# Patient Record
Sex: Female | Born: 1970 | Race: Black or African American | Hispanic: No | Marital: Married | Smoking: Never smoker
Health system: Southern US, Community
[De-identification: ages and names within clinical notes are randomized; demographics above are authoritative.]

## PROBLEM LIST (undated history)

## (undated) DIAGNOSIS — D649 Anemia, unspecified: Secondary | ICD-10-CM

## (undated) DIAGNOSIS — I8393 Asymptomatic varicose veins of bilateral lower extremities: Secondary | ICD-10-CM

## (undated) DIAGNOSIS — I10 Essential (primary) hypertension: Secondary | ICD-10-CM

## (undated) DIAGNOSIS — T7840XA Allergy, unspecified, initial encounter: Secondary | ICD-10-CM

## (undated) DIAGNOSIS — E7800 Pure hypercholesterolemia, unspecified: Secondary | ICD-10-CM

## (undated) DIAGNOSIS — R7989 Other specified abnormal findings of blood chemistry: Secondary | ICD-10-CM

## (undated) DIAGNOSIS — N921 Excessive and frequent menstruation with irregular cycle: Secondary | ICD-10-CM

## (undated) DIAGNOSIS — N83209 Unspecified ovarian cyst, unspecified side: Secondary | ICD-10-CM

## (undated) DIAGNOSIS — Z9889 Other specified postprocedural states: Secondary | ICD-10-CM

## (undated) DIAGNOSIS — N393 Stress incontinence (female) (male): Secondary | ICD-10-CM

## (undated) DIAGNOSIS — Z30013 Encounter for initial prescription of injectable contraceptive: Secondary | ICD-10-CM

## (undated) DIAGNOSIS — M222X9 Patellofemoral disorders, unspecified knee: Secondary | ICD-10-CM

## (undated) DIAGNOSIS — Z8759 Personal history of other complications of pregnancy, childbirth and the puerperium: Secondary | ICD-10-CM

## (undated) HISTORY — DX: Anemia, unspecified: D64.9

## (undated) HISTORY — DX: Encounter for initial prescription of injectable contraceptive: Z30.013

## (undated) HISTORY — DX: Other specified abnormal findings of blood chemistry: R79.89

## (undated) HISTORY — DX: Personal history of other complications of pregnancy, childbirth and the puerperium: Z87.59

## (undated) HISTORY — DX: Allergy, unspecified, initial encounter: T78.40XA

## (undated) HISTORY — DX: Essential (primary) hypertension: I10

## (undated) HISTORY — DX: Asymptomatic varicose veins of bilateral lower extremities: I83.93

## (undated) HISTORY — DX: Unspecified ovarian cyst, unspecified side: N83.209

## (undated) HISTORY — DX: Excessive and frequent menstruation with irregular cycle: N92.1

## (undated) HISTORY — DX: Stress incontinence (female) (male): N39.3

---
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
## 1997-09-06 ENCOUNTER — Other Ambulatory Visit: Admission: RE | Admit: 1997-09-06 | Discharge: 1997-09-06 | Payer: Self-pay | Admitting: Family Medicine

## 1999-06-06 ENCOUNTER — Encounter: Payer: Self-pay | Admitting: Family Medicine

## 1999-06-06 ENCOUNTER — Encounter: Admission: RE | Admit: 1999-06-06 | Discharge: 1999-06-06 | Payer: Self-pay | Admitting: Family Medicine

## 1999-10-23 ENCOUNTER — Inpatient Hospital Stay (HOSPITAL_COMMUNITY): Admission: AD | Admit: 1999-10-23 | Discharge: 1999-10-23 | Payer: Self-pay | Admitting: *Deleted

## 1999-11-13 ENCOUNTER — Ambulatory Visit (HOSPITAL_COMMUNITY): Admission: RE | Admit: 1999-11-13 | Discharge: 1999-11-13 | Payer: Self-pay | Admitting: *Deleted

## 2000-01-01 ENCOUNTER — Ambulatory Visit (HOSPITAL_COMMUNITY): Admission: RE | Admit: 2000-01-01 | Discharge: 2000-01-01 | Payer: Self-pay | Admitting: *Deleted

## 2000-01-05 ENCOUNTER — Inpatient Hospital Stay (HOSPITAL_COMMUNITY): Admission: AD | Admit: 2000-01-05 | Discharge: 2000-01-05 | Payer: Self-pay | Admitting: Obstetrics

## 2000-05-11 ENCOUNTER — Inpatient Hospital Stay (HOSPITAL_COMMUNITY): Admission: AD | Admit: 2000-05-11 | Discharge: 2000-05-11 | Payer: Self-pay | Admitting: *Deleted

## 2000-05-12 ENCOUNTER — Encounter: Payer: Self-pay | Admitting: Obstetrics & Gynecology

## 2000-05-21 ENCOUNTER — Ambulatory Visit (HOSPITAL_COMMUNITY): Admission: RE | Admit: 2000-05-21 | Discharge: 2000-05-21 | Payer: Self-pay | Admitting: Obstetrics

## 2000-05-24 ENCOUNTER — Inpatient Hospital Stay (HOSPITAL_COMMUNITY): Admission: AD | Admit: 2000-05-24 | Discharge: 2000-05-24 | Payer: Self-pay | Admitting: *Deleted

## 2000-05-30 ENCOUNTER — Encounter (HOSPITAL_COMMUNITY): Admission: RE | Admit: 2000-05-30 | Discharge: 2000-06-02 | Payer: Self-pay | Admitting: Obstetrics & Gynecology

## 2000-05-30 ENCOUNTER — Inpatient Hospital Stay (HOSPITAL_COMMUNITY): Admission: AD | Admit: 2000-05-30 | Discharge: 2000-05-30 | Payer: Self-pay | Admitting: Obstetrics & Gynecology

## 2000-05-30 ENCOUNTER — Inpatient Hospital Stay (HOSPITAL_COMMUNITY): Admission: AD | Admit: 2000-05-30 | Discharge: 2000-06-01 | Payer: Self-pay | Admitting: Obstetrics & Gynecology

## 2000-06-05 ENCOUNTER — Encounter: Admission: RE | Admit: 2000-06-05 | Discharge: 2000-07-05 | Payer: Self-pay | Admitting: *Deleted

## 2000-06-20 ENCOUNTER — Inpatient Hospital Stay (HOSPITAL_COMMUNITY): Admission: AD | Admit: 2000-06-20 | Discharge: 2000-06-20 | Payer: Self-pay | Admitting: Obstetrics & Gynecology

## 2001-08-09 ENCOUNTER — Inpatient Hospital Stay (HOSPITAL_COMMUNITY): Admission: AD | Admit: 2001-08-09 | Discharge: 2001-08-09 | Payer: Self-pay | Admitting: *Deleted

## 2001-09-11 ENCOUNTER — Inpatient Hospital Stay (HOSPITAL_COMMUNITY): Admission: AD | Admit: 2001-09-11 | Discharge: 2001-09-11 | Payer: Self-pay | Admitting: *Deleted

## 2001-09-28 ENCOUNTER — Observation Stay (HOSPITAL_COMMUNITY): Admission: AD | Admit: 2001-09-28 | Discharge: 2001-09-29 | Payer: Self-pay | Admitting: *Deleted

## 2001-10-30 ENCOUNTER — Ambulatory Visit (HOSPITAL_COMMUNITY): Admission: RE | Admit: 2001-10-30 | Discharge: 2001-10-30 | Payer: Self-pay | Admitting: *Deleted

## 2001-11-16 ENCOUNTER — Inpatient Hospital Stay (HOSPITAL_COMMUNITY): Admission: AD | Admit: 2001-11-16 | Discharge: 2001-11-16 | Payer: Self-pay | Admitting: *Deleted

## 2001-12-25 ENCOUNTER — Inpatient Hospital Stay (HOSPITAL_COMMUNITY): Admission: AD | Admit: 2001-12-25 | Discharge: 2001-12-27 | Payer: Self-pay | Admitting: Obstetrics and Gynecology

## 2001-12-26 ENCOUNTER — Encounter: Payer: Self-pay | Admitting: Obstetrics and Gynecology

## 2001-12-28 ENCOUNTER — Inpatient Hospital Stay (HOSPITAL_COMMUNITY): Admission: AD | Admit: 2001-12-28 | Discharge: 2001-12-28 | Payer: Self-pay | Admitting: *Deleted

## 2002-02-04 ENCOUNTER — Inpatient Hospital Stay (HOSPITAL_COMMUNITY): Admission: AD | Admit: 2002-02-04 | Discharge: 2002-02-04 | Payer: Self-pay | Admitting: *Deleted

## 2002-02-23 ENCOUNTER — Inpatient Hospital Stay (HOSPITAL_COMMUNITY): Admission: AD | Admit: 2002-02-23 | Discharge: 2002-02-23 | Payer: Self-pay | Admitting: *Deleted

## 2002-03-21 ENCOUNTER — Inpatient Hospital Stay (HOSPITAL_COMMUNITY): Admission: AD | Admit: 2002-03-21 | Discharge: 2002-03-23 | Payer: Self-pay | Admitting: *Deleted

## 2003-02-02 ENCOUNTER — Other Ambulatory Visit: Admission: RE | Admit: 2003-02-02 | Discharge: 2003-02-02 | Payer: Self-pay | Admitting: *Deleted

## 2003-02-02 ENCOUNTER — Other Ambulatory Visit: Admission: RE | Admit: 2003-02-02 | Discharge: 2003-02-02 | Payer: Self-pay | Admitting: Obstetrics and Gynecology

## 2003-02-12 ENCOUNTER — Encounter: Payer: Self-pay | Admitting: Emergency Medicine

## 2003-02-12 ENCOUNTER — Emergency Department (HOSPITAL_COMMUNITY): Admission: EM | Admit: 2003-02-12 | Discharge: 2003-02-12 | Payer: Self-pay | Admitting: Emergency Medicine

## 2003-06-21 ENCOUNTER — Emergency Department (HOSPITAL_COMMUNITY): Admission: EM | Admit: 2003-06-21 | Discharge: 2003-06-21 | Payer: Self-pay | Admitting: Family Medicine

## 2003-06-28 ENCOUNTER — Emergency Department (HOSPITAL_COMMUNITY): Admission: EM | Admit: 2003-06-28 | Discharge: 2003-06-28 | Payer: Self-pay | Admitting: Family Medicine

## 2003-08-22 ENCOUNTER — Inpatient Hospital Stay (HOSPITAL_COMMUNITY): Admission: AD | Admit: 2003-08-22 | Discharge: 2003-08-22 | Payer: Self-pay | Admitting: *Deleted

## 2003-08-25 ENCOUNTER — Inpatient Hospital Stay (HOSPITAL_COMMUNITY): Admission: AD | Admit: 2003-08-25 | Discharge: 2003-08-25 | Payer: Self-pay | Admitting: Obstetrics and Gynecology

## 2003-08-26 ENCOUNTER — Inpatient Hospital Stay (HOSPITAL_COMMUNITY): Admission: AD | Admit: 2003-08-26 | Discharge: 2003-08-26 | Payer: Self-pay | Admitting: Obstetrics and Gynecology

## 2003-09-01 ENCOUNTER — Inpatient Hospital Stay (HOSPITAL_COMMUNITY): Admission: AD | Admit: 2003-09-01 | Discharge: 2003-09-01 | Payer: Self-pay | Admitting: Obstetrics and Gynecology

## 2003-09-09 ENCOUNTER — Inpatient Hospital Stay (HOSPITAL_COMMUNITY): Admission: AD | Admit: 2003-09-09 | Discharge: 2003-09-09 | Payer: Self-pay | Admitting: Obstetrics and Gynecology

## 2003-09-21 ENCOUNTER — Inpatient Hospital Stay (HOSPITAL_COMMUNITY): Admission: AD | Admit: 2003-09-21 | Discharge: 2003-09-21 | Payer: Self-pay | Admitting: Obstetrics and Gynecology

## 2003-10-21 ENCOUNTER — Inpatient Hospital Stay (HOSPITAL_COMMUNITY): Admission: AD | Admit: 2003-10-21 | Discharge: 2003-10-21 | Payer: Self-pay | Admitting: Obstetrics and Gynecology

## 2003-10-30 ENCOUNTER — Inpatient Hospital Stay (HOSPITAL_COMMUNITY): Admission: AD | Admit: 2003-10-30 | Discharge: 2003-10-30 | Payer: Self-pay | Admitting: Obstetrics and Gynecology

## 2003-12-30 ENCOUNTER — Emergency Department (HOSPITAL_COMMUNITY): Admission: EM | Admit: 2003-12-30 | Discharge: 2003-12-30 | Payer: Self-pay | Admitting: Emergency Medicine

## 2004-03-13 ENCOUNTER — Inpatient Hospital Stay (HOSPITAL_COMMUNITY): Admission: AD | Admit: 2004-03-13 | Discharge: 2004-03-14 | Payer: Self-pay | Admitting: Obstetrics and Gynecology

## 2004-03-27 ENCOUNTER — Other Ambulatory Visit: Admission: RE | Admit: 2004-03-27 | Discharge: 2004-03-27 | Payer: Self-pay | Admitting: Obstetrics and Gynecology

## 2004-04-30 ENCOUNTER — Emergency Department (HOSPITAL_COMMUNITY): Admission: EM | Admit: 2004-04-30 | Discharge: 2004-04-30 | Payer: Self-pay | Admitting: Family Medicine

## 2004-05-12 ENCOUNTER — Inpatient Hospital Stay (HOSPITAL_COMMUNITY): Admission: AD | Admit: 2004-05-12 | Discharge: 2004-05-12 | Payer: Self-pay | Admitting: Obstetrics and Gynecology

## 2004-06-15 ENCOUNTER — Emergency Department (HOSPITAL_COMMUNITY): Admission: EM | Admit: 2004-06-15 | Discharge: 2004-06-15 | Payer: Self-pay | Admitting: Family Medicine

## 2004-06-18 ENCOUNTER — Inpatient Hospital Stay (HOSPITAL_COMMUNITY): Admission: AD | Admit: 2004-06-18 | Discharge: 2004-06-20 | Payer: Self-pay | Admitting: Obstetrics and Gynecology

## 2004-07-09 ENCOUNTER — Ambulatory Visit (HOSPITAL_COMMUNITY): Admission: RE | Admit: 2004-07-09 | Discharge: 2004-07-09 | Payer: Self-pay | Admitting: Obstetrics and Gynecology

## 2004-08-07 ENCOUNTER — Inpatient Hospital Stay (HOSPITAL_COMMUNITY): Admission: AD | Admit: 2004-08-07 | Discharge: 2004-08-07 | Payer: Self-pay | Admitting: Obstetrics and Gynecology

## 2004-09-28 ENCOUNTER — Inpatient Hospital Stay (HOSPITAL_COMMUNITY): Admission: AD | Admit: 2004-09-28 | Discharge: 2004-09-29 | Payer: Self-pay | Admitting: Obstetrics and Gynecology

## 2004-10-10 ENCOUNTER — Inpatient Hospital Stay (HOSPITAL_COMMUNITY): Admission: AD | Admit: 2004-10-10 | Discharge: 2004-10-10 | Payer: Self-pay | Admitting: Obstetrics and Gynecology

## 2004-10-11 ENCOUNTER — Inpatient Hospital Stay (HOSPITAL_COMMUNITY): Admission: AD | Admit: 2004-10-11 | Discharge: 2004-10-13 | Payer: Self-pay | Admitting: Obstetrics and Gynecology

## 2005-01-22 ENCOUNTER — Ambulatory Visit: Payer: Self-pay | Admitting: *Deleted

## 2005-02-13 ENCOUNTER — Ambulatory Visit: Payer: Self-pay | Admitting: Family Medicine

## 2005-02-15 ENCOUNTER — Ambulatory Visit: Payer: Self-pay | Admitting: Family Medicine

## 2005-02-27 ENCOUNTER — Emergency Department (HOSPITAL_COMMUNITY): Admission: EM | Admit: 2005-02-27 | Discharge: 2005-02-27 | Payer: Self-pay | Admitting: Family Medicine

## 2005-08-05 ENCOUNTER — Ambulatory Visit: Payer: Self-pay | Admitting: Family Medicine

## 2005-08-05 ENCOUNTER — Other Ambulatory Visit: Admission: RE | Admit: 2005-08-05 | Discharge: 2005-08-05 | Payer: Self-pay | Admitting: Family Medicine

## 2005-08-06 ENCOUNTER — Ambulatory Visit: Payer: Self-pay | Admitting: Family Medicine

## 2005-08-19 ENCOUNTER — Emergency Department (HOSPITAL_COMMUNITY): Admission: EM | Admit: 2005-08-19 | Discharge: 2005-08-19 | Payer: Self-pay | Admitting: Family Medicine

## 2006-04-22 ENCOUNTER — Emergency Department (HOSPITAL_COMMUNITY): Admission: EM | Admit: 2006-04-22 | Discharge: 2006-04-22 | Payer: Self-pay | Admitting: Family Medicine

## 2006-07-25 ENCOUNTER — Inpatient Hospital Stay (HOSPITAL_COMMUNITY): Admission: AD | Admit: 2006-07-25 | Discharge: 2006-07-26 | Payer: Self-pay | Admitting: Gynecology

## 2006-07-28 ENCOUNTER — Inpatient Hospital Stay (HOSPITAL_COMMUNITY): Admission: AD | Admit: 2006-07-28 | Discharge: 2006-07-28 | Payer: Self-pay | Admitting: Gynecology

## 2006-07-31 ENCOUNTER — Inpatient Hospital Stay (HOSPITAL_COMMUNITY): Admission: AD | Admit: 2006-07-31 | Discharge: 2006-07-31 | Payer: Self-pay | Admitting: Obstetrics and Gynecology

## 2006-08-03 ENCOUNTER — Inpatient Hospital Stay (HOSPITAL_COMMUNITY): Admission: AD | Admit: 2006-08-03 | Discharge: 2006-08-03 | Payer: Self-pay | Admitting: Obstetrics and Gynecology

## 2006-08-22 ENCOUNTER — Inpatient Hospital Stay (HOSPITAL_COMMUNITY): Admission: AD | Admit: 2006-08-22 | Discharge: 2006-08-22 | Payer: Self-pay | Admitting: Gynecology

## 2007-03-16 ENCOUNTER — Encounter (INDEPENDENT_AMBULATORY_CARE_PROVIDER_SITE_OTHER): Payer: Self-pay | Admitting: Family Medicine

## 2007-11-11 ENCOUNTER — Inpatient Hospital Stay (HOSPITAL_COMMUNITY): Admission: AD | Admit: 2007-11-11 | Discharge: 2007-11-11 | Payer: Self-pay | Admitting: Obstetrics and Gynecology

## 2007-12-23 ENCOUNTER — Inpatient Hospital Stay (HOSPITAL_COMMUNITY): Admission: AD | Admit: 2007-12-23 | Discharge: 2007-12-23 | Payer: Self-pay | Admitting: Obstetrics and Gynecology

## 2007-12-25 ENCOUNTER — Inpatient Hospital Stay (HOSPITAL_COMMUNITY): Admission: AD | Admit: 2007-12-25 | Discharge: 2007-12-25 | Payer: Self-pay | Admitting: Obstetrics and Gynecology

## 2008-01-05 ENCOUNTER — Inpatient Hospital Stay (HOSPITAL_COMMUNITY): Admission: AD | Admit: 2008-01-05 | Discharge: 2008-01-05 | Payer: Self-pay | Admitting: Obstetrics and Gynecology

## 2008-01-06 ENCOUNTER — Inpatient Hospital Stay (HOSPITAL_COMMUNITY): Admission: AD | Admit: 2008-01-06 | Discharge: 2008-01-06 | Payer: Self-pay | Admitting: Obstetrics and Gynecology

## 2008-01-08 ENCOUNTER — Inpatient Hospital Stay (HOSPITAL_COMMUNITY): Admission: AD | Admit: 2008-01-08 | Discharge: 2008-01-10 | Payer: Self-pay | Admitting: Obstetrics and Gynecology

## 2008-05-03 ENCOUNTER — Emergency Department (HOSPITAL_COMMUNITY): Admission: EM | Admit: 2008-05-03 | Discharge: 2008-05-03 | Payer: Self-pay | Admitting: Family Medicine

## 2008-05-14 ENCOUNTER — Emergency Department (HOSPITAL_COMMUNITY): Admission: EM | Admit: 2008-05-14 | Discharge: 2008-05-14 | Payer: Self-pay | Admitting: Family Medicine

## 2008-08-26 ENCOUNTER — Ambulatory Visit: Payer: Self-pay | Admitting: Family Medicine

## 2008-09-13 ENCOUNTER — Ambulatory Visit: Payer: Self-pay | Admitting: Family Medicine

## 2008-12-01 ENCOUNTER — Ambulatory Visit: Payer: Self-pay | Admitting: Internal Medicine

## 2008-12-02 ENCOUNTER — Ambulatory Visit: Payer: Self-pay | Admitting: Internal Medicine

## 2009-03-02 ENCOUNTER — Ambulatory Visit: Payer: Self-pay | Admitting: Family Medicine

## 2009-03-15 ENCOUNTER — Encounter (INDEPENDENT_AMBULATORY_CARE_PROVIDER_SITE_OTHER): Payer: Self-pay | Admitting: Adult Health

## 2009-03-15 ENCOUNTER — Ambulatory Visit: Payer: Self-pay | Admitting: Family Medicine

## 2009-03-15 LAB — CONVERTED CEMR LAB
LDL Cholesterol: 143 mg/dL — ABNORMAL HIGH (ref 0–99)
TSH: 0.447 microintl units/mL (ref 0.350–4.500)
Total CHOL/HDL Ratio: 3.8
VLDL: 12 mg/dL (ref 0–40)

## 2009-04-25 ENCOUNTER — Ambulatory Visit: Payer: Self-pay | Admitting: Family Medicine

## 2009-05-26 ENCOUNTER — Encounter (INDEPENDENT_AMBULATORY_CARE_PROVIDER_SITE_OTHER): Payer: Self-pay | Admitting: Adult Health

## 2009-05-26 ENCOUNTER — Ambulatory Visit: Payer: Self-pay | Admitting: Internal Medicine

## 2009-05-26 LAB — CONVERTED CEMR LAB
AST: 16 units/L (ref 0–37)
Albumin: 4.7 g/dL (ref 3.5–5.2)
CO2: 20 meq/L (ref 19–32)
Glucose, Bld: 91 mg/dL (ref 70–99)
HDL: 50 mg/dL (ref >39)
Potassium: 4.2 meq/L (ref 3.5–5.3)
Sodium: 139 meq/L (ref 135–145)
Total Bilirubin: 0.6 mg/dL (ref 0.3–1.2)
Triglycerides: 56 mg/dL (ref <150)

## 2010-02-21 ENCOUNTER — Ambulatory Visit: Payer: Self-pay | Admitting: Family Medicine

## 2010-02-21 ENCOUNTER — Encounter (INDEPENDENT_AMBULATORY_CARE_PROVIDER_SITE_OTHER): Payer: Self-pay | Admitting: *Deleted

## 2010-02-21 LAB — CONVERTED CEMR LAB
ALT: 10 units/L (ref 0–35)
Cholesterol: 181 mg/dL (ref 0–200)
HDL: 59 mg/dL (ref >39)
LDL Cholesterol: 105 mg/dL — ABNORMAL HIGH (ref 0–99)
Total CHOL/HDL Ratio: 3.1
Triglycerides: 83 mg/dL (ref <150)
VLDL: 17 mg/dL (ref 0–40)

## 2010-02-23 ENCOUNTER — Emergency Department (HOSPITAL_COMMUNITY): Admission: EM | Admit: 2010-02-23 | Discharge: 2010-02-23 | Payer: Self-pay | Admitting: Family Medicine

## 2010-04-25 ENCOUNTER — Encounter (INDEPENDENT_AMBULATORY_CARE_PROVIDER_SITE_OTHER): Payer: Self-pay | Admitting: *Deleted

## 2010-04-25 LAB — CONVERTED CEMR LAB
ALT: 14 units/L (ref 0–35)
Alkaline Phosphatase: 43 units/L (ref 39–117)
BUN: 12 mg/dL (ref 6–23)
CO2: 22 meq/L (ref 19–32)
Calcium: 8.7 mg/dL (ref 8.4–10.5)
Glucose, Bld: 83 mg/dL (ref 70–99)
RBC: 4.24 M/uL (ref 3.87–5.11)
RDW: 13.3 % (ref 11.5–15.5)
TSH: 0.327 microintl units/mL — ABNORMAL LOW (ref 0.350–4.500)
Total Protein: 7.2 g/dL (ref 6.0–8.3)

## 2010-04-26 ENCOUNTER — Encounter (INDEPENDENT_AMBULATORY_CARE_PROVIDER_SITE_OTHER): Payer: Self-pay | Admitting: Internal Medicine

## 2010-04-26 LAB — CONVERTED CEMR LAB
Free T4: 1 ng/dL (ref 0.80–1.80)
T3, Total: 132.7 ng/dL (ref 80.0–204.0)

## 2010-05-18 ENCOUNTER — Encounter (INDEPENDENT_AMBULATORY_CARE_PROVIDER_SITE_OTHER): Payer: Self-pay | Admitting: *Deleted

## 2010-05-18 LAB — CONVERTED CEMR LAB
Basophils Relative: 1 % (ref 0–1)
Chlamydia, DNA Probe: NEGATIVE
GC Probe Amp, Genital: NEGATIVE
Lymphocytes Relative: 45 % (ref 12–46)
Lymphs Abs: 2 10*3/uL (ref 0.7–4.0)
MCHC: 33.1 g/dL (ref 30.0–36.0)
Neutro Abs: 1.7 10*3/uL (ref 1.7–7.7)
Platelets: 330 10*3/uL (ref 150–400)
RBC: 4.31 M/uL (ref 3.87–5.11)
RDW: 13 % (ref 11.5–15.5)
T4, Total: 9 ug/dL (ref 5.0–12.5)

## 2010-05-22 ENCOUNTER — Encounter: Admit: 2010-05-22 | Payer: Self-pay | Admitting: Family Medicine

## 2010-09-18 NOTE — H&P (Signed)
Andrea Pope, [name]             ACCOUNT NO.:  [id number]   MEDICAL RECORD NO.:  [id number]          PATIENT TYPE:  INP   LOCATION:  9170                          [facility]:  [facility]   PHYSICIAN:  Osborn Coho, M.D.   DATE OF BIRTH:  [birth year]   DATE OF ADMISSION:  01/08/2008  DATE OF DISCHARGE:                              HISTORY & PHYSICAL   Andrea Pope is a 40 year old married black female gravida 6, para 3-0-2-  3 at [redacted] weeks gestation per an [facility] of January 15, 2008 who presents in  active labor.  Reports good fetal movement.  Denies leakage of fluid,  vaginal bleeding, PIH or UTI signs or symptoms nausea, vomiting,  diarrhea, shortness of breath, cough, fever, or chills.  She reports  regular contractions since 5:30 a.m.  She has been followed by the CNM  service at [facility].  Her history is remarkable for  1. History of HSV 1 and 2.  She had a reported lesion which she      described as a bump around 36 weeks on left labia majora which      was not open and no herpetic-looking blistering.  She denied any      prodromal symptoms.  She does have palpated the bump there.  We      increased her Valtrex to 500 mg p.o. b.i.d. at that time.  2. She has a history of PIH with some elevated blood pressures over      the last week.  Today her blood pressure is 142/85.  She had a      preeclampsia workup on September 1 and labs were within normal      limits.  Her 24-hour urine which was completed on the 2nd had a      total protein equal to 182.  3. Advanced maternal age.  4. History of STDs.  5. GBS negative.  6. Cholestasis this pregnancy and has been on ursodiol for systemic      itching.   PRENATAL LABS:  The patient's blood type is O+, Rh antibody screen  negative.  Sickle cell negative.  RPR nonreactive, rubella titer immune.  Hepatitis surface antigen negative, HIV nonreactive.  The Gonorrhea and  chlamydia cultures January 27 were negative.  CF is negative.  Sickle  cell trait  negative.  Hemoglobin 13.1 first trimester and platelets were  322.  GBS is negative.   OBSTETRICAL HISTORY:  Gravida 1, spontaneous vaginal delivery at 40  weeks, female in 2002 after 12 hours of labor.  Weight was 6 pounds 8  ounces.  His name is Andrea Pope.  It was complicated by PIH.  Gravida 2 was  a spontaneous vaginal delivery 2003, another female Dibari, weight 7  pounds 12 ounces at 38 weeks after 16 hours of labor.  She did have an  epidural.  It was a vacuum assist.  Gravida 3 was an elective abortion  at 8-9 weeks in 2004.  Gravida 4 with a spontaneous abortion around 6  weeks.  She passed everything naturally.  No D and C needed; that was in  2005.  Gravida 5 was a spontaneous vaginal delivery June 2006, a female  weighing 7 pounds 5 ounces at [redacted] weeks gestation after 4 hours of labor  and no anesthesia, no complications.  Her name is Andrea Pope.  Gravida 6 is  current pregnancy.   PAST MEDICAL HISTORY:  She denies medication or latex allergies or other  sensitivities.  Menarche at 40 years of age, monthly cycles.  No  abnormalities.  She had PIH with her first pregnancy and some elevated  blood pressures here at the end of this 6th pregnancy, treated for  chlamydia and gonorrhea in the past as well as Trichomonas and frequent  yeast infections.  Reports normal childhood illnesses.  Has had group B  strep in the past.  Hepatitis B vaccine.  Occasional UTI.  She has been  hospitalized in the past for childbirth and dehydration.   FAMILY HISTORY:  Remarkable for a maternal grandfather and paternal  grandfather chronic hypertension.  Maternal grandmother and mom's sister  with varicosities.  A maternal uncle and maternal grandmother diabetes.  Maternal grandmother and thyroid dysfunction.  Paternal grandfather  seizure disorder.  A paternal grandfather prostate cancer.  Mom's sister  schizophrenia.  Genetic history remarkable for a paternal uncle with  Down syndrome, father of baby sickle  cell trait, father of the baby's  niece has a disease.  Father of baby had a cousin who is missed a digit.   SOCIAL HISTORY:  She is a married black female.  Her husband's name is  Ron, he is involved and supportive.  The patient is a Runner, broadcasting/film/video.  Father  of baby is unemployed and he is disabled.  The patient denied any  tobacco, alcohol or illicit drug use.  She entered care at [facility]  approximately 11 weeks on February 20.  She did have first trimester  screen which was within normal limits.  Pap smear was within normal  limits as well.  She complained of some varicosities around 14 and 6/7  weeks and discussed support hose p.r.n.  She had anatomy ultrasound at  17 and 6/7 weeks, growth consistent with dating cervical length 3.11 cm.  Heart, anatomy and face are poorly seen.  Placenta was at fundus.  Plan  was to rescanned in 2 weeks.  She did have AFP only that day at 19 and  3/7 weeks.  She returned and followup ultrasound, all anatomy not  previously seen was observed.  Her AFP was normal.  She voiced desire to  participate in [employer] and was given a note to participate.  Complained of some dizzy spells around 23 and 3/7 weeks after prolonged  standing or sitting.  Encouraged to increase her fluid intake.  Around  26 and 3/7 weeks.  She complained of some nausea, vomiting and diarrhea,  loss of appetite.  Chem-9 showed 3+ ketones with specific gravity 1.020.  Offered IV fluid at that time at triage but patient declined and went on  to increase her p.o. fluids.  She had her Glucola 27 and 5/7 weeks.  Hemoglobin that time was 11.  Had some left ankle swelling around that  same time.  No known injury.  Was encouraged to elevate the foot and  Epsom salt soaks.  Her RPR was nonreactive and at time of her Glucola,  Glucola was equal to 155 and was scheduled a 3-hour GTT.  Complained of  some systemic itching bile acids were elevated at time of Glucola and  they were equal  to 33.  Consulted  with Dr. Su Hilt and the patient was  started on ursodiol 300 mg p.o. b.i.d. for itching.  Plan was also made  for her to have NSTs twice a week around 32 weeks until the end of the  pregnancy secondary to increased risk of placental insufficiency.  She  had good relief with ursodiol.  Had slightly elevated ALT around 30  weeks and as well glucose with presumptive relationship to the ursodiol.  Plan was made to repeat the CMET at her next visit and watch LFTs.  The  patient does plan to breast feed.  She is expecting a boy.  Complained  of a bump on left labia majora around 34-35 weeks, was eventually  start on Valtrex 500 daily and around 36 weeks was increased to 500  b.i.d.  She had reactive NSTs on her visits.  At time of the patient's  report of the bump on the labia, she did not have any prodromal  symptoms.  Around 36 and 4/7 weeks she had GC chlamydia and GBS cultures  which all were negative.  The patient's pregnancy continued to progress  without any other complications with the exception of some elevated  blood pressures which she had a PIH workup on September 1.  PIH labs  were normal.  She turned in a 24-hour urine on September 2 and total  protein for the 24 hours was 182.  Just a note, LDH was 164, uric acid  5.5.  Her AST was 24 and ALT was 25.  Her platelet count was 237.  Hemoglobin was 11.9 just to note as well.   OBJECTIVE:  The patient's blood pressure is 142/85.  She is afebrile and  other vital signs are stable.  Fetal heart rate 135, reactive, no D cells and moderate variability.  Toco uterine contractions every 5 minutes, moderate on palpation.  GENERAL:  Noted discomfort with her contractions but at rest is very  pleasant.  She is alert and oriented x3.  HEENT:  Is grossly intact and within normal limits.  LUNGS:  Other to auscultation bilaterally.  CARDIOVASCULAR:  Regular rate and rhythm without murmur.  ABDOMEN:  Soft, nontender and gravid.  No rebound or  guarding.  PELVIC:  Sterile spec exam, no internal or external lesions were noted.  Cervix was 5 cm, 80% -1 with bulging membranes.  EXTREMITIES:  Within normal limits.  DTRs 2+, no clonus.   IMPRESSION:  1. Intrauterine pregnancy at 39 weeks.  2. Active labor.  3. Desires epidural.   PLAN:  1. Admit to birthing suites with Dr. Su Hilt as attending physician.  2. Routine labor and delivery orders.  3. Epidural as soon as possible.  4. AROM p.r.n. augmentation.      Candice Denny Levy, CNM      ______________________________  Osborn Coho, M.D.    CHS/MEDQ  D:  01/08/2008  T:  01/08/2008  Job:  [id number]

## 2010-09-21 NOTE — Discharge Summary (Signed)
NAMEFLORINE, SPRENKLE              ACCOUNT NO.:  [id number]   MEDICAL RECORD NO.:  [id number]          PATIENT TYPE:  INP   LOCATION:  9315                          [facility]  [facility]   PHYSICIAN:  Naima A. Dillard, M.D. DATE OF BIRTH:  March 24, 1971   DATE OF ADMISSION:  06/18/2004  DATE OF DISCHARGE:  06/20/2004                                 DISCHARGE SUMMARY   ADMISSION DIAGNOSES:  1.  Intrauterine pregnancy at 21-5/7 weeks.  2.  Pneumonia.   DISCHARGE DIAGNOSES:  1.  Intrauterine pregnancy at 22 weeks.  2.  Status post pneumonia, stable, afebrile.   HISTORY OF PRESENT ILLNESS:  [name] is a 40 year old gravida 5, para 2-0-  2-2, who presented at 21-5/7 weeks for IV fluids secondary to upper  respiratory tract infection, weakness, and nausea.  Her temperature on  admission was 100.7.  Chest x-ray revealed pneumonia.  Date of admission:  06/18/04  Date of discharge:  06/20/04   Discharge Medications:  Tussionex  z-pack  PNV   HOSPITAL COURSE:  She was admitted for IV antibiotics, IV hydration.  Her  vital signs have remained stable through her admission, she has been  afebrile, and today on June 20, 2004, her lungs are clear to  auscultation.  Her O2 saturations are 99% on room air.  She is afebrile and  she is deemed to be in satisfactory condition for discharge.  She is  therefore discharged home to complete her p.o. antibiotics which is to  complete her Z-Pak.  She is given a prescription for Tussionex one teaspoon  p.o. q.12h p.r.n. cough.  She is to stay home and rest for the rest of this  week and return to work next Monday, June 25, 2004.  She is to call with  any shortness of breath or temperature over 100 or any problems or concerns.  She will keep her next scheduled appointment at the office of [facility].      SDM/MEDQ  D:  06/20/2004  T:  06/20/2004  Job:  [id number]

## 2010-09-21 NOTE — Discharge Summary (Signed)
   NAMEKASSONDRA, Andrea Pope                        ACCOUNT NO.:  [id number]   MEDICAL RECORD NO.:  [id number]                   PATIENT TYPE:  INP   LOCATION:  9119                                 [facility]   PHYSICIAN:  Phil D. Okey Dupre, M.D.                  DATE OF BIRTH:  [birth year]   DATE OF ADMISSION:  12/25/2001  DATE OF DISCHARGE:  12/27/2001                                 DISCHARGE SUMMARY   HISTORY OF PRESENT ILLNESS:  The patient is a 40 year old gravida 2 para 1-0-  0-1 at exactly [redacted] weeks gestation on the day of discharge.  She was admitted  two days ago with spiking fever as her only complaint, although she did say  that she had some low abdominal pressure.  Her one-and-a-half year old baby  who is in daycare had had diarrhea and fever for a couple of days.  The  patient was admitted for evaluation and observation.   HOSPITAL COURSE:  During the first 36 hours of her admission she did spike  fevers as high as 101.  All testing with the exception of a GBS culture -  which was positive - were negative.  The patient's white count was normal.  Urine culture was negative.  The patient has had no fever for the past 12  hours, and observing the pattern prior to that gives the impression that  this is a viral syndrome.  She is asymptomatic now and desires discharge.  We have counseled the patient to continue on Tylenol q.6h. for next couple  of days to avoid the fever affecting the baby at this time.  She has a  follow-up appointment in the clinic for the beginning of this week, which  she will keep.  Discharge instructions as to activity, follow-up, and diet  have been given to the patient.   DISCHARGE DIAGNOSES:  1. Intrauterine pregnancy at [redacted] weeks gestation.  2. Viral syndrome, resolving.                                               Phil D. Okey Dupre, M.D.    PDR/MEDQ  D:  12/27/2001  T:  12/27/2001  Job:  [id number]

## 2010-09-21 NOTE — H&P (Signed)
NAMEHILDE, CHURCHMAN             ACCOUNT NO.:  [id number]   MEDICAL RECORD NO.:  [id number]          PATIENT TYPE:  INP   LOCATION:  9140                          [facility]   PHYSICIAN:  Hal Morales, M.D.DATE OF BIRTH:  [birth year]   DATE OF ADMISSION:  10/11/2004  DATE OF DISCHARGE:                                HISTORY & PHYSICAL   REASON FOR ADMISSION:  Ms. Trefz is a 40 year old gravida 5, para 2-0-2-2  at 38-3/7 weeks who presented early this morning with report of uterine  contractions every two to three minutes.  The patient had been seen in  maternity admissions and at the office on October 10, 2004 with irregular  contractions.  At that time, her cervix was a fingertip dilated.  She was  observed in maternity admissions and then was sent home with lack of  cervical change.  Her office visit was also remarkable for the noting of two  small vulvar lesions on the left side of the peroneum.  The patient reports  these have been here approximately 1-1/2 weeks.  They were nontender.  She  felt they were related to irritation from vaginal discharge and tight  underwear.  The patient denied any history of HSV.  Her partner also today  has denied any history of HSV.  HSV culture of the areas and HSV  glycoprotein I and II were done yesterday.  When the patient was in the  office yesterday, we also discussed the issue of HSV in late third trimester  and the affect on labor and birth.  The patient continued to report she has  had no history.  She was instructed at that time that at the time of labor  to include having to discuss again the issue of possible HSV exposure.  Her  pregnancy has been remarkable for:   On admission today, she was 6 cm with contractions every two to three  minutes.  History is remarkable for:  1.  History of hyperemesis.  2.  History of two vulvar lesions noted yesterday with pending HSV titers.  3.  History of PIH with her first pregnancy.  4.   Group B strep negative and Glucola negative.  5.  History of recurrent Trichomonas.   LABORATORIES:  Blood type is O+, RH antibody negative, VDRL nonreactive,  Rubella titer positive, hepatitis B surface antigen negative, group B strep  culture was negative.  HIV was declined.  GC and Chlamydia cultures were  negative in the first trimester as well as in the third trimester.  Cystic  fibrosis testing was negative.  Sickle cell test was negative.  Pap was  normal.  Group B strep culture was negative.  Quadruple screen was normal.  Glucola was normal.  An EDC of October 22, 2004 was established by last  menstrual period and was in agreement with ultrasound at approximately 7 and  18 weeks.   HISTORY OF PRESENT PREGNANCY:  The patient entered care at approximately 10  weeks.  She had a chest x-ray in October for bronchitis.  Nausea and  vomiting was reasonably severe in  the first part of pregnancy but then  resolved.  At 18 weeks, she had an ultrasound showing normal growth and  development.  She was diagnosed with Trichomonas on May 22, 2004.  She  had not started her treatment as of May 24, 2004.  Quadruple screen was  done that was normal.  She had an upper respiratory infection at 22 weeks.  She was sent for IV fluids secondary to lack of ability to hydrate herself.  Her Glucola was normal.  She had a chest x-ray at 24 weeks due to persistent  upper respiratory issues and cough.  This was normal.  By 28 weeks, she had  another wet prep that was negative.  She has been unable to take the  metronidazole as previously prescribed for her Trichomonas.  She was treated  again in April for this.  Wet prep was negative again at 34 weeks.  GC and  Chlamydia cultures were also negative.  Group B strep culture was negative.  She had a small glandular area noted in the right axilla.  By 36 weeks, this  was evaluated twice.  A plan was made for breast ultrasound however, this  was scheduled  today.  This will be rescheduled.  At 38 weeks, she was seen  for contractions and vaginal disease.  Trichomonas was still present.  There  were two small ulcerations on the left vulva.  HSV cultures were done and  HSV I and II glycoproteins were done although the patient denied any  history.  These were also nontender.  She felt that they were related to the  vaginal discharge from the Trichomonas irritating the area as well as tight  underwear.  The cervix was very posterior, vertex at a 0 to a +2 station at  that time.  Her cervix did not change after evaluation and she was sent  home.   OBSTETRICAL HISTORY:  In January of 2002, she had a vaginal birth of a female  infant, weighted 6 pounds 8 ounces at 40 weeks.  She was in labor 12 hours.  She had no anesthesia.  In November of 2003, she had a vaginal birth of a  female infant that weighed 7 pounds 12 ounces at 38 weeks.  She was in labor  16 hours.  That was a vacuum-assisted vaginal delivery.  In November of  2004, she had a pregnancy termination at 8 or 9 weeks.  In April of 2005,  she had a spontaneous miscarriage at approximately 6 weeks.  No D&C was  needed.  In her first pregnancy, she did have some elevated blood pressures.  She also had frequent yeast infections when she was pregnant.  She had group  B strep with her second pregnancy.   MEDICAL HISTORY:  She was treated for Chlamydia in 1995.  She was also  treated for gonorrhea in 2000 and Trichomonas in 2001.  She has also had  Trichomonas during this pregnancy.  She reports the usual childhood  illnesses.  The patient has no known medication allergies.  She also has a  history of occasional urinary tract infections.   FAMILY HISTORY:  Her father is hypertensive on medication.  Her paternal  grandfather passed away from complications of hypertension.  Her mother,  maternal grandmother and sister had varicosities.  Her brother was on oral medication for diabetes.  Her maternal  grandmother was an insulin-dependent  diabetic but is now deceased.  Her mother and maternal grandmother have  hypothyroidism.  Maternal grandfather had a possible stroke.  Her father and  paternal grandfather had prostate cancer.  Her sister is schizophrenic.  The  patient's only other hospitalizations were for childbirth.  She also was  hospitalized for dehydration during her previous pregnancies in 2002 and  2003.   GENETIC HISTORY:  Remarkable for the patient's paternal aunt having Down's  syndrome.  The father of the baby has sickle cell trait.  The father of the  baby niece has sickle cell disease.  Father of the baby paternal cousin has  some missing digits.   SOCIAL HISTORY:  The patient is married to the father of the baby.  He is  involved and supportive.  His name is Ron Psychologist, counselling.  The patient has two  years of college.  She is a Architectural technologist.  The father has also two  years of college.  He is employed as a Production designer, theatre/television/film.  She has been followed by  the certified nurse mid wife service at [facility].  She denies any  alcohol, drug or tobacco use during this pregnancy.  She is African American  in ethnicity.   PHYSICAL EXAMINATION:  VITAL SIGNS:  Stable.  The patient is afebrile.  HEENT:  Within normal limits.  LUNGS:  Breath sounds are clear.  HEART:  Regular rate and rhythm without murmur.  BREASTS:  Soft and nontender.  ABDOMEN:  Fundal height is approximately 38 cm.  Estimated fetal weight is 7-  8 pounds.  Uterine contractions every two minutes, strong quality.  Cervical  exam on admission was 6 cm.  The patient is currently now completely dilated  a short time after that with spontaneous rupture of membranes and clear  fluid noted.  The patient is now pushing.  Fetal heart rate is reactive with  no decelerations.  EXTREMITIES:  Deep tendon reflexes are 2+ without clonus.  There is a trace  edema noted.  Perineum is remarkable for two small excoriated areas.   These  are nontender.  These were the circumstances that were cultured yesterday  and the HSV I and II glycoproteins were done.  Those results are not yet  available through the laboratory.   ASSESSMENT:  1.  Intra-uterine pregnancy at 38-3/7 weeks.  2.  Rapid progression of labor, now pushing.  3.  Vulvar lesions with HSV cultures pending.   PLAN:  1.  Admit to the birthing suite for consult with Dr. Dierdre Forth as the      attending physician.  2.  Routine certified nurse midwife orders.  3.  Review very quickly with the patient the history of the vulgar lesions      however, in light of her rapid progress in labor, it will be impossible      to intervene with the cesarean prior to this patient's delivering      vaginally.  In addition, the patient does not wish to proceed with a      cesarean section at this time.  HSV cultures and HSV glycoprotein      testing will be followed up on and the nursery will be informed of the      situation. 4.  The patient desired an epidural but this will be impossible due to her      rapid progress in labor.       VLL/MEDQ  D:  10/11/2004  T:  10/11/2004  Job:  [id number]

## 2010-09-21 NOTE — H&P (Signed)
Andrea Pope, Andrea Pope              ACCOUNT NO.:  [id number]   MEDICAL RECORD NO.:  [id number]          PATIENT TYPE:  MAT   LOCATION:  MATC                          FACILITY:  [facility]   PHYSICIAN:  Hal Morales, M.D.DATE OF BIRTH:  28-Feb-1971   DATE OF ADMISSION:  06/18/2004  DATE OF DISCHARGE:                                HISTORY & PHYSICAL   HISTORY OF PRESENT ILLNESS:  Andrea Pope is a 40 year old gravida 5, para 2-0-  2-2 at 21-5/7 weeks who presented from the office for one bag of IV fluid  secondary to URI and weakness and nausea.  She was seen at Urgent Care at  [facility] on February 10.  She was prescribed a Z-pack.  She had negative  flu testing, negative Strep throat testing.  She reports a productive cough,  fever, body aches over the weekend.  She denies cramping, bleeding,  discharge, or dysuria.  She reports positive fetal movement.  Pregnancy has  been remarkable for:  1.  History of PIH with first pregnancy.  2.  History of STDs in the past.  3.  History of hyperemesis with previous pregnancies.  4.  History of bronchitis and pneumonia in October of 2005.   LABORATORY DATA:  Prenatal labs:  Blood type is O positive, Rh antibody  negative.  VDRL nonreactive.  Rubella titer positive.  Hepatitis B surface  antigen negative.  Sickle cell test negative.  GC and Chlamydia cultures  were negative in the first trimester.  Pap was normal.  Cystic fibrosis  testing was negative.  Hemoglobin upon entry into practice was 12.3.  EDC of  October 22, 2004 was established by last menstrual period and was in agreement  with ultrasound at approximately seven weeks.   HISTORY OF PRESENT PREGNANCY:  The patient entered care at approximately 10  weeks.  She was on Phenergan and suppositories in early pregnancy for  nausea.  The rest of her pregnancy has been essentially uncomplicated.  She  evidently was treated back in October prior to entering prenatal care for  bronchitis and  pneumonia.  She did have a chest x-ray at that time at [facility].  She was on Levaquin and some other antibiotic which the patient  cannot remember.   OBSTETRICAL HISTORY:  In 2002 she had a vaginal birth of a female infant who  weighed 6 pounds 8 ounces at 40 weeks' gestation.  She was in labor 12  hours.  She had no medications.  She had no complications.  She did have PIH  with her first pregnancy.  She also had a history of frequent yeast  infections when she was pregnant.  In 2003 she had a vaginal birth of a female  infant who weighed 7 pounds 12 ounces at 38 weeks.  She was in labor 16  hours, had epidural anesthesia.  This was a vacuum assisted vaginal birth.  In that pregnancy she did have group B Strep.  In 2004 she had a TAD at 8-9  weeks.  In June of 2005 she had a first trimester miscarriage at  approximately six weeks.  No D&C was needed.   PAST MEDICAL HISTORY:  1.  In 1995 she was treated for Chlamydia.  2.  In 2000 she was treated for gonorrhea.  3.  In 2001 she was treated for Trichomonas.  4.  She reports the usual childhood illnesses.  5.  She has history of UTI occasionally.  6.  Her only other hospitalization was for childbirth and for dehydration in      2002 and 2003 pregnancies.   ALLERGIES:  None.   FAMILY HISTORY:  Her father is hypertensive on medication.  Her paternal  grandfather is now deceased and was hypertensive.  Her mother, maternal  grandmother, and sister all have varicosities.  Her brother is a diabetic on  oral medication.  Her maternal grandmother was on insulin but she is now  deceased.  The patient's mother and maternal grandmother had hypothyroidism.  Paternal grandfather had a possible stroke.  Father and paternal grandfather  had prostate cancer.  Sister has schizophrenia.   GENETIC HISTORY:  Remarkable for the patient's aunt having Down's syndrome.  Father of the baby has sickle cell traits.  The father of the baby's niece  has sickle  cell disease.  The father of the baby's cousin has missing  digits.   SOCIAL HISTORY:  The patient is married to the father of the baby.  He is  involved and supportive.  His name is Ron Psychologist, counselling.  The patient is African-  American.  She has two years of college.  She is employed as a Armed forces operational officer.  Her partner also has two years of college.  He is employed as an  Social worker.  She has been followed by the certified nurse midwife service  of [place].  She denies any alcohol, drug, or tobacco use during  this pregnancy.   PHYSICAL EXAMINATION:  VITAL SIGNS:  Temperature on admission was 100.7.  Follow-up was 101.1.  After Tylenol it came down to 99.1.  Pulse was 105,  respirations were 20, blood pressure was 107/61.  Fetal heart tones were in  the 160s.  There were no uterine contractions on toco.  HEENT:  Remarkable for throat being slightly red.  Ears are clear.  HEART:  Regular rate and rhythm without murmur.  BREASTS:  Soft and nontender.  CHEST:  Clear but slightly diminished sounds bilaterally.  ABDOMEN:  Fundal height is approximately 21 weeks, soft and nontender.  As  noted negative CVA tenderness.  Urine sample was negative at the office.  PELVIC:  Deferred.  EXTREMITIES:  Deep tendon reflexes are 2+ without clonus.  There is trace  edema noted.   LABORATORY DATA:  Hemoglobin today was 11.9, white blood cell count 5.9, and  platelets were 226.  Neutrophils were 79.   X-ray shows increased markings in the left lower lobe posteriorly,  worrisome for early pneumonia.   IMPRESSION:  1.  Intrauterine pregnancy at 21-5/7 weeks.  2.  Early pneumonia with febrile illness.   PLAN:  1.  Admit to [facility] for consult with Dr. Dierdre Forth, attending physician.  2.  IV hydration.  3.  Ancef 2 g IV q.8.  4.  Vital signs per routine with fetal heart tones. 5.  Phenergan 25 mg IV q.6h. p.r.n. nausea.  6.  Will closely observe  toleration of IV fluids.  7.  Will keep Tylenol on an as needed basis or for temperature greater  than      or equal to 100.  8.  Will reevaluate in the morning.      VLL/MEDQ  D:  06/18/2004  T:  06/18/2004  Job:  [id number]

## 2010-09-21 NOTE — H&P (Signed)
NAMEKEMPER, HOCHMAN                        ACCOUNT NO.:  [id number]   MEDICAL RECORD NO.:  [id number]                   PATIENT TYPE:  INP   LOCATION:  9162                                 [facility]ILITY:  [facility]   PHYSICIAN:  Quintin Alto, M.D.                  DATE OF BIRTH:  1970-08-13   DATE OF ADMISSION:  12/25/2001  DATE OF DISCHARGE:                                HISTORY & PHYSICAL   CHIEF COMPLAINT:  Pelvic pressure.   HISTORY OF PRESENT ILLNESS:  The patient is a 40 year old G2, P1-0-0-1 who  presents at 25-5/7ths weeks' with complaint of lower abdominal cramps with  onset at 13:00 this afternoon.  The patient states that she has had lower  pelvic cramps that feel like a period off and on which are worse when she  stands up or attempts to go to the bathroom.  She was noted, this evening,  to have chills when the remainder of her family has been warm.  She has had  diarrhea for the past 24 hours and a mild headache.  She denies any back  pain.  However, she has had lower back pain for the last two months  secondary to her pregnancy.  She has noted good fetal movement and denies  any change in her vaginal discharge.  She has had thick vaginal discharge  for the past three months and has attributed this to a history of a  diagnosis of Trichomonas two to three months ago at which time she was  prescribed Flagyl.  However, patient was having lots of nausea and vomiting  at this time and did not take her Flagyl prescription until the past 48  hours and she began taking it at that time.  The patient was also given a  prescription for Macrobid in the past for asymptomatic [employer].  However, she  has not taken this medication either secondary to persistent nausea and  vomiting.  Patient's only ill contact has been her son who is in daycare and  has had diarrhea for the past 48 hours.  However, he has not had any fevers.  She has no cough, runny nose or shortness of breath.   OBSTETRICAL  HISTORY:  Patient had prior pregnancy which was complicated by  elevated blood pressure which required increased monitoring but no  intervention.  History of UTI with her first pregnancy.  Current pregnancy  is dated well by LMP and 18-week ultrasound with EDC of April 02, 2002.  Current pregnancy has been complicated by asymptomatic bacteriuria and  history of Trichomonas, both of which have not been adequately treated.   OUTPATIENT MEDICATIONS:  1. Flagyl b.i.d. for the past 24 hours.  2. Flintstones p.o. q.d.   ALLERGIES:  No known drug allergies.   GYNECOLOGICAL HISTORY:  Patient has a history of GC and Chlamydia as well as  Trichomonas in the past.  Patient denies  any medical or surgical history.   SOCIAL HISTORY:  Patient is engaged and lives in [place] with her son and  her fiance.  She denies any tobacco or alcohol use history.   FAMILY HISTORY:  Father and grandfather with hypertension.  Brother and  maternal grandmother with diabetes.  Mother with hypothyroidism.  Sister  with schizophrenia.   PRENATAL LABORATORY DATA:  Patient's blood type O+, antibody screen  negative, hematocrit 39.6, platelets 276, rubella immune, hepatitis B  surface antigen negative, syphilis negative, HIV negative, GC and Chlamydia  were negative on November 17, 2001.  Patient has not had GBS or one-hour Glucola  obtained.  Most recent ultrasound is 18-5/7ths weeks' which was for dating  and fetal anatomy.  Due to fetal positioning, unable to view four-chamber  heart and thus follow up was recommended.   PHYSICAL EXAMINATION:  VITALS:  Temperature 100.6, increased to 101.6 during  maternal admission unit stay.  Pulse 76, respirations 16, blood pressure  115/66.  GENERAL:  Patient is alert and oriented x 3 in no acute distress, sitting  comfortably in bed.  CARDIOVASCULAR:  Normal S1, S2.  Regular rate and rhythm.  No murmurs.  LUNGS:  Clear to auscultation.  Breathing is unlabored.  No wheezes,  rhonchi  or rales.  ABDOMEN:  Gravid, positive bowel sounds, soft, no right upper quadrant or  right lower quadrant tenderness.  Patient does complain of tenderness with  deep pelvic pressure.  EXTREMITIES:  No cyanosis, clubbing or edema.  NEUROLOGICAL:  Cranial nerves II-XII intact.  DTRs 2+ throughout.  HEENT:  Atraumatic, normocephalic.  Pupils equal, round and reactive to  light.  Nares patent without discharge.  Oral mucosa is pink and tacky.  Pharynx is without erythema.  There is no lymphadenopathy noted.  GYN:  Speculum examination reveals thin white discharge with no evidence of  friability of the cervix.  Os appears closed.  Digital and cervical  examinations posterior, -3.  Internal os is closed.  However, external os is  soft.  No cervical motion tenderness.  Tocometry:  Contractions are noted  irregularly approximately every 10 minutes duration of 1/2 minute palpating  mild.  Fetal heart rate 160-170, reactive, positive variability.   LABORATORY DATA:  CBC:  Hemoglobin 12.2, hematocrit 36.2%, platelet count  233, red blood cell count 8.8.  Basic metabolic profile is entirely within  normal limits.  Her LFTs are normal.  Fetal fibronectin is negative.  Urinalysis:  Specific gravity 1.025, pH 7.0.  Negative for glucose,  hemoglobin, bilirubin, 15 mg/dl ketones, protein negative.  Micro:  3-6  white blood cells per high power field.  Urine cultures currently pending as  a cath specimen.  Group B Strep, GC and Chlamydia are all pending.   ASSESSMENT:  28. 40 year old G2, P1-0-0-1 at 25-5/7ths weeks' with good dating with     history of fever and lower pelvic pain as well as diarrheal illness.     Patient's fever is likely secondary to pyelonephritis and this would be     consistent with her history of asymptomatic bacteriuria that was not     treated.  Will admit patient for IV fluids and IV antibiotics.  Urine    culture is currently pending and will treat until results are  back.  Will     monitor for persistent uterine contractions.  However, I feel with IV     fluids and defervescence, these will end and patient will not have any  cervical changing.  Will follow up on urine cultures.  Will obtain OB     ultrasound for fetal anatomy.  This case was discussed with Dr. Shawnie Pons.                                               Quintin Alto, M.D.    SG/MEDQ  D:  12/26/2001  T:  12/26/2001  Job:  [id number]   cc:   [facility]

## 2011-01-31 LAB — URINE MICROSCOPIC-ADD ON

## 2011-01-31 LAB — URINALYSIS, ROUTINE W REFLEX MICROSCOPIC
Bilirubin Urine: NEGATIVE
Hgb urine dipstick: NEGATIVE
Nitrite: NEGATIVE
Specific Gravity, Urine: 1.015
Urobilinogen, UA: 1
pH: 5.5

## 2011-02-06 LAB — CREATININE CLEARANCE, URINE, 24 HOUR
Creatinine, 24H Ur: 1610
Creatinine, Urine: 79.5
Creatinine: 0.6
Urine Total Volume-CRCL: 2025

## 2011-02-06 LAB — COMPREHENSIVE METABOLIC PANEL
ALT: 23
Albumin: 2.7 — ABNORMAL LOW
BUN: 5 — ABNORMAL LOW
CO2: 22
Calcium: 8.7
Chloride: 106
Chloride: 106
Creatinine, Ser: 0.63
GFR calc Af Amer: >60
GFR calc Af Amer: >60
Potassium: 3.8
Potassium: 3.9
Sodium: 135
Total Protein: 6.6

## 2011-02-06 LAB — CBC
Hemoglobin: 12.1
MCHC: 32.8
MCV: 90.8
Platelets: 237
WBC: 7.9

## 2011-02-06 LAB — PROTEIN, URINE, 24 HOUR
Protein, 24H Urine: 182 — ABNORMAL HIGH
Protein, Urine: 9

## 2011-02-06 LAB — RPR: RPR Ser Ql: NONREACTIVE

## 2011-03-17 ENCOUNTER — Emergency Department (HOSPITAL_COMMUNITY)
Admission: EM | Admit: 2011-03-17 | Discharge: 2011-03-17 | Disposition: A | Discharge status: Home / Self Care | Payer: Self-pay | Source: Home / Self Care | Attending: Family Medicine | Admitting: Family Medicine

## 2011-03-17 ENCOUNTER — Encounter: Payer: Self-pay | Admitting: *Deleted

## 2011-03-17 DIAGNOSIS — IMO0001 Reserved for inherently not codable concepts without codable children: Secondary | ICD-10-CM

## 2011-03-17 HISTORY — DX: Pure hypercholesterolemia, unspecified: E78.00

## 2011-03-17 LAB — POCT I-STAT, CHEM 8
Chloride: 105 mEq/L (ref 96–112)
Creatinine, Ser: 0.8 mg/dL (ref 0.50–1.10)
Glucose, Bld: 84 mg/dL (ref 70–99)
HCT: 41 % (ref 36.0–46.0)
Hemoglobin: 13.9 g/dL (ref 12.0–15.0)
Potassium: 3.8 mEq/L (ref 3.5–5.1)
Sodium: 139 mEq/L (ref 135–145)

## 2011-03-17 NOTE — ED Notes (Signed)
Pt resting comfortably

## 2011-03-17 NOTE — ED Provider Notes (Signed)
History     CSN: [id number] Arrival date & time: 03/17/2011 11:15 AM   First MD Initiated Contact with Patient 03/17/11 1126      Chief Complaint  Patient presents with  . Leg Pain    onset of bilateral  leg pain wednesday started on cholesterol med monday - concerned pain from medication - pain worse with walking - feels like the beginning of a cramp     (Consider location/radiation/quality/duration/timing/severity/associated sxs/prior treatment) Patient is a 40 y.o. female presenting with leg pain.  Leg Pain  The incident occurred more than 2 days ago. The incident occurred at home. There was no injury mechanism. The pain is present in the left thigh and right thigh. The pain is mild. Associated symptoms include numbness and tingling. Pertinent negatives include no inability to bear weight and no muscle weakness. The symptoms are aggravated by bearing weight and activity.    Past Medical History  Diagnosis Date  . High cholesterol     History reviewed. No pertinent past surgical history.  Family History  Problem Relation Age of Onset  . Hypertension Mother   . Thyroid disease Mother   . Hypertension Father   . Diabetes Brother   . Diabetes Other     History  Substance Use Topics  . Smoking status: Never Smoker   . Smokeless tobacco: Not on file  . Alcohol Use: No    OB History    Grav Para Term Preterm Abortions TAB SAB Ect Mult Living                  Review of Systems  Constitutional: Negative.   HENT: Negative.   Respiratory: Negative.   Cardiovascular: Negative.   Gastrointestinal: Negative.   Genitourinary: Negative.   Musculoskeletal: Positive for myalgias.  Skin: Negative.   Neurological: Positive for tingling and numbness. Negative for dizziness.    Allergies  Review of patient's allergies indicates no known allergies.  Home Medications   Current Outpatient Rx  Name Route Sig Dispense Refill  . PRAVASTATIN SODIUM 40 MG PO TABS Oral Take  40 mg by mouth daily.        BP 134/85  Pulse 60  Temp 98.7 F (37.1 C)  Resp 15  SpO2 100%  LMP 03/11/2011  Physical Exam  Constitutional: She appears well-developed and well-nourished.  HENT:  Head: Normocephalic and atraumatic.  Eyes: EOM are normal.  Musculoskeletal: She exhibits tenderness.       Right upper leg: She exhibits tenderness.       Left upper leg: She exhibits tenderness.    ED Course  Procedures (including critical care time)  Labs Reviewed - No data to display No results found.   No diagnosis found.    MDM  Labs reviewed. No abnormalities noted. CK 162.        Richardo Priest, MD 03/17/11 1310

## 2012-05-06 LAB — HM MAMMOGRAPHY: HM Mammogram: NORMAL

## 2012-12-04 LAB — HM PAP SMEAR: HM PAP: NORMAL

## 2012-12-18 ENCOUNTER — Other Ambulatory Visit (HOSPITAL_COMMUNITY): Payer: Self-pay | Admitting: Obstetrics and Gynecology

## 2012-12-18 DIAGNOSIS — Z1231 Encounter for screening mammogram for malignant neoplasm of breast: Secondary | ICD-10-CM

## 2012-12-24 ENCOUNTER — Ambulatory Visit (HOSPITAL_COMMUNITY)
Admission: RE | Admit: 2012-12-24 | Discharge: 2012-12-24 | Disposition: A | Discharge status: Home / Self Care | Payer: Self-pay | Source: Ambulatory Visit | Attending: Obstetrics and Gynecology | Admitting: Obstetrics and Gynecology

## 2012-12-24 DIAGNOSIS — Z1231 Encounter for screening mammogram for malignant neoplasm of breast: Secondary | ICD-10-CM

## 2013-05-02 ENCOUNTER — Emergency Department (INDEPENDENT_AMBULATORY_CARE_PROVIDER_SITE_OTHER): Payer: Self-pay

## 2013-05-02 ENCOUNTER — Emergency Department (HOSPITAL_COMMUNITY)
Admission: EM | Admit: 2013-05-02 | Discharge: 2013-05-02 | Disposition: A | Discharge status: Home / Self Care | Payer: Self-pay | Source: Home / Self Care | Attending: Emergency Medicine | Admitting: Emergency Medicine

## 2013-05-02 ENCOUNTER — Encounter (HOSPITAL_COMMUNITY): Payer: Self-pay | Admitting: Emergency Medicine

## 2013-05-02 DIAGNOSIS — J111 Influenza due to unidentified influenza virus with other respiratory manifestations: Secondary | ICD-10-CM

## 2013-05-02 MED ORDER — ACETAMINOPHEN 325 MG PO TABS
650.0000 mg | ORAL_TABLET | Freq: Once | ORAL | Status: AC
  Administered 2013-05-02: 650 mg via ORAL

## 2013-05-02 MED ORDER — TRAMADOL HCL 50 MG PO TABS: 100.0000 mg | ORAL_TABLET | Freq: Three times a day (TID) | ORAL | Status: DC | PRN

## 2013-05-02 MED ORDER — OSELTAMIVIR PHOSPHATE 75 MG PO CAPS: 75.0000 mg | ORAL_CAPSULE | Freq: Two times a day (BID) | ORAL | Status: DC

## 2013-05-02 MED ORDER — ACETAMINOPHEN 325 MG PO TABS
ORAL_TABLET | ORAL | Status: AC
  Filled 2013-05-02: qty 2

## 2013-05-02 MED ORDER — GUAIFENESIN-CODEINE 100-10 MG/5ML PO SYRP: 10.0000 mL | ORAL_SOLUTION | Freq: Four times a day (QID) | ORAL | Status: DC | PRN

## 2013-05-02 NOTE — ED Notes (Signed)
Instructed to put on gown 

## 2013-05-02 NOTE — ED Notes (Signed)
Cough, sore throat, body aches, particularly legs aching .  Onset of symptoms for 2 days.  Patient reports feeling generally bad.  Low grade temp reported -100.

## 2013-05-02 NOTE — ED Provider Notes (Signed)
Chief Complaint   Chief Complaint  Patient presents with  . Cough  . Sore Throat    History of Present Illness   Andrea Pope is a 42 year old female who has had a four-day history of dry cough, wheezing, chest tightness, chest pain, sore throat, postnasal drip, temperature of up to 100, myalgias, overall, generalized weakness, nasal congestion with clear rhinorrhea, headache, sinus pressure, ear congestion, and nausea. She denies any vomiting or diarrhea. She has had no specific sick exposures.  Review of Systems   Other than noted above, the patient denies any of the following symptoms: Systemic:  No fevers, chills, sweats, weight loss or gain, fatigue, or tiredness. Eye:  No redness or discharge. ENT:  No ear pain, drainage, headache, nasal congestion, drainage, sinus pressure, difficulty swallowing, or sore throat. Neck:  No neck pain or swollen glands. Lungs:  No cough, sputum production, hemoptysis, wheezing, chest tightness, shortness of breath or chest pain. GI:  No abdominal pain, nausea, vomiting or diarrhea.  PMFSH   Past medical history, family history, social history, meds, and allergies were reviewed.  Physical exam   Vital signs:  There were no vitals taken for this visit. General:  Alert and oriented.  In no distress.  Skin warm and dry. Eye:  No conjunctival injection or drainage. Lids were normal. ENT:  TMs and canals were normal, without erythema or inflammation.  Nasal mucosa was clear and uncongested, without drainage.  Mucous membranes were moist.  Pharynx was clear with no exudate or drainage.  There were no oral ulcerations or lesions. Neck:  Supple, no adenopathy, tenderness or mass. Lungs:  No respiratory distress.  Lungs were clear to auscultation, without wheezes, rales or rhonchi.  Breath sounds were clear and equal bilaterally.  Heart:  Regular rhythm, without gallops, murmers or rubs. Skin:  Clear, warm, and dry, without rash or  lesions.   Labs   Results for orders placed during the hospital encounter of 05/02/13  POCT RAPID STREP A ([facility] ONLY)      Result Value Range   Streptococcus, Group A Screen (Direct) NEGATIVE  NEGATIVE     Radiology   Dg Chest 2 View  05/02/2013   CLINICAL DATA:  Cough, fever and body aches.  EXAM: CHEST  2 VIEW  COMPARISON:  02/27/2005  FINDINGS: The heart size and mediastinal contours are within normal limits. Both lungs are clear. The visualized skeletal structures are unremarkable.  IMPRESSION: No active cardiopulmonary disease.   Electronically Signed   By: Amie Portland M.D.   On: 05/02/2013 12:34   Assessment     The encounter diagnosis was Influenza-like illness.   Plan    1.  Meds:  The following meds were prescribed:   New Prescriptions   GUAIFENESIN-CODEINE (GUIATUSS AC) 100-10 MG/5ML SYRUP    Take 10 mLs by mouth 4 (four) times daily as needed for cough.   OSELTAMIVIR (TAMIFLU) 75 MG CAPSULE    Take 1 capsule (75 mg total) by mouth every 12 (twelve) hours.   TRAMADOL (ULTRAM) 50 MG TABLET    Take 2 tablets (100 mg total) by mouth every 8 (eight) hours as needed.    2.  Patient Education/Counseling:  The patient was given appropriate handouts, self care instructions, and instructed in symptomatic relief.  Instructed to get extra fluids, rest, and use a cool mist vaporizer.   3.  Follow up:  The patient was told to follow up here if no better in 3 to  4 days, or sooner if becoming worse in any way, and given some red flag symptoms such as increasing fever, difficulty breathing, chest pain, or persistent vomiting which would prompt immediate return.  Follow up here as needed.      Reuben Likes, MD 05/02/13 1311

## 2013-05-04 LAB — CULTURE, GROUP A STREP

## 2013-10-15 LAB — LIPID PANEL
Cholesterol: 213 mg/dL — AB (ref 0–200)
HDL: 65 mg/dL (ref 35–70)
LDL CALC: 135 mg/dL
Triglycerides: 63 mg/dL (ref 40–160)

## 2013-12-10 ENCOUNTER — Other Ambulatory Visit (HOSPITAL_COMMUNITY): Payer: Self-pay | Admitting: Family Medicine

## 2013-12-10 DIAGNOSIS — Z1231 Encounter for screening mammogram for malignant neoplasm of breast: Secondary | ICD-10-CM

## 2014-01-13 ENCOUNTER — Ambulatory Visit (HOSPITAL_COMMUNITY): Admission: RE | Admit: 2014-01-13 | Payer: Self-pay | Source: Ambulatory Visit

## 2014-08-03 ENCOUNTER — Encounter
Admit: 2014-08-03 | Disposition: A | Discharge status: Home / Self Care | Payer: Self-pay | Attending: Urology | Admitting: Urology

## 2014-08-05 ENCOUNTER — Ambulatory Visit
Admit: 2014-08-05 | Disposition: A | Discharge status: Home / Self Care | Payer: Self-pay | Attending: Family Medicine | Admitting: Family Medicine

## 2014-08-05 ENCOUNTER — Encounter
Admit: 2014-08-05 | Disposition: A | Discharge status: Home / Self Care | Payer: Self-pay | Attending: Urology | Admitting: Urology

## 2014-09-28 DIAGNOSIS — R200 Anesthesia of skin: Secondary | ICD-10-CM | POA: Insufficient documentation

## 2014-09-28 DIAGNOSIS — R29898 Other symptoms and signs involving the musculoskeletal system: Secondary | ICD-10-CM | POA: Insufficient documentation

## 2014-11-08 ENCOUNTER — Ambulatory Visit: Payer: Medicaid Other

## 2014-11-08 DIAGNOSIS — Z3042 Encounter for surveillance of injectable contraceptive: Secondary | ICD-10-CM

## 2014-11-08 MED ORDER — MEDROXYPROGESTERONE ACETATE 150 MG/ML IM SUSP
150.0000 mg | Freq: Once | INTRAMUSCULAR | Status: AC
  Administered 2014-11-08: 150 mg via INTRAMUSCULAR

## 2015-01-10 ENCOUNTER — Other Ambulatory Visit: Payer: Self-pay | Admitting: Family Medicine

## 2015-01-10 ENCOUNTER — Encounter (INDEPENDENT_AMBULATORY_CARE_PROVIDER_SITE_OTHER): Payer: Self-pay

## 2015-01-10 ENCOUNTER — Ambulatory Visit
Admission: RE | Admit: 2015-01-10 | Discharge: 2015-01-10 | Disposition: A | Discharge status: Home / Self Care | Payer: Medicaid Other | Source: Ambulatory Visit | Attending: Family Medicine | Admitting: Family Medicine

## 2015-01-10 ENCOUNTER — Encounter: Payer: Self-pay | Admitting: Family Medicine

## 2015-01-10 ENCOUNTER — Ambulatory Visit (INDEPENDENT_AMBULATORY_CARE_PROVIDER_SITE_OTHER): Payer: Medicaid Other | Admitting: Family Medicine

## 2015-01-10 VITALS — BP 128/88 | HR 74 | Temp 98.7°F | Resp 16 | Wt 198.8 lb

## 2015-01-10 DIAGNOSIS — Z3042 Encounter for surveillance of injectable contraceptive: Secondary | ICD-10-CM | POA: Insufficient documentation

## 2015-01-10 DIAGNOSIS — M542 Cervicalgia: Secondary | ICD-10-CM

## 2015-01-10 DIAGNOSIS — J302 Other seasonal allergic rhinitis: Secondary | ICD-10-CM | POA: Insufficient documentation

## 2015-01-10 DIAGNOSIS — R7989 Other specified abnormal findings of blood chemistry: Secondary | ICD-10-CM

## 2015-01-10 DIAGNOSIS — Z862 Personal history of diseases of the blood and blood-forming organs and certain disorders involving the immune mechanism: Secondary | ICD-10-CM | POA: Insufficient documentation

## 2015-01-10 DIAGNOSIS — E66811 Obesity, class 1: Secondary | ICD-10-CM

## 2015-01-10 DIAGNOSIS — I8393 Asymptomatic varicose veins of bilateral lower extremities: Secondary | ICD-10-CM | POA: Insufficient documentation

## 2015-01-10 DIAGNOSIS — M47892 Other spondylosis, cervical region: Secondary | ICD-10-CM | POA: Diagnosis not present

## 2015-01-10 DIAGNOSIS — E669 Obesity, unspecified: Secondary | ICD-10-CM

## 2015-01-10 DIAGNOSIS — N921 Excessive and frequent menstruation with irregular cycle: Secondary | ICD-10-CM | POA: Insufficient documentation

## 2015-01-10 HISTORY — DX: Other specified abnormal findings of blood chemistry: R79.89

## 2015-01-10 MED ORDER — CYCLOBENZAPRINE HCL 5 MG PO TABS: 5.0000 mg | ORAL_TABLET | Freq: Every day | ORAL | Status: DC

## 2015-01-10 MED ORDER — IBUPROFEN 800 MG PO TABS: 800.0000 mg | ORAL_TABLET | Freq: Three times a day (TID) | ORAL | Status: DC | PRN

## 2015-01-10 NOTE — Progress Notes (Signed)
Name: Andrea Pope   MRN: [id number]    DOB: 05-20-1970   Date:01/10/2015       Progress Note  Subjective  Chief Complaint  Chief Complaint  Patient presents with  . Labs Only    patient questions hypothyroidism due to weight gain, hair loss and strong family history  . Neck Pain    HPI  Andrea Pope is a 44 year old female who is here today to discuss neck pain and her previous history of abnormal TSH function which normalized on repeat. Joint/Muscle Pain: Patient complains of arthralgias and myalgias for which has been present for several months. Pain is located in neck radiating down shoulders, is described as aching and tight band, and is intermittent .  Associated symptoms include: decreased range of motion.  The patient has tried cold, heat, NSAIDs, position change and rest for pain, with minimal relief.  Related to injury:   No.  Hypothyroidism: Patient presents for evaluation of thyroid function. Symptoms consist of fatigue, weight gain, change in skin,  nails, or hair. Symptoms have present for several months. The symptoms are moderate.  The problem has been unchanged.  Previous thyroid studies include TSH, free thyroxine index and free thyroxine. She is concerned about her rapid weight gain which she did not have with Depo Provera in the past so she is not sure if this is coming from her birth control or thyroid disorder. She is decided on not continuing with Depo Provera for now.     Patient Active Problem List   Diagnosis Date Noted  . Abnormal thyroid stimulating hormone (TSH) level 01/10/2015  . Allergic rhinitis, seasonal 01/10/2015  . History of anemia 01/10/2015  . Excessive and frequent menstruation with irregular cycle 01/10/2015  . Asymptomatic varicose veins of both lower extremities 01/10/2015  . Encounter for surveillance of injectable contraceptive 01/10/2015  . Hand numbness 09/28/2014  . Hand weakness 09/28/2014    Social History  Substance Use Topics   . Smoking status: Never Smoker   . Smokeless tobacco: Never Used  . Alcohol Use: No     Current outpatient prescriptions:  .  fluticasone (FLONASE) 50 MCG/ACT nasal spray, Place 2 sprays into the nose daily., Disp: , Rfl:  .  ibuprofen (ADVIL,MOTRIN) 200 MG tablet, Take 200 mg by mouth every 6 (six) hours as needed., Disp: , Rfl:  .  medroxyPROGESTERone (DEPO-PROVERA) 150 MG/ML injection, Inject 1 Dose into the muscle every 3 (three) months., Disp: , Rfl:   No past surgical history on file.  Family History  Problem Relation Age of Onset  . Hypertension Mother   . Thyroid disease Mother   . Hypertension Father   . Cancer Father     Prostate  . Diabetes Brother   . Diabetes Other   . Cancer Paternal Uncle     Prostate  . Diabetes Maternal Grandmother   . Cancer Paternal Grandfather     Prostate    No Known Allergies   Review of Systems  CONSTITUTIONAL: No significant weight changes, fever, chills, weakness or fatigue.  HEENT:  - Eyes: No visual changes.  - Ears: No auditory changes. No pain.  - Nose: No sneezing, congestion, runny nose. - Throat: No sore throat. No changes in swallowing. SKIN: No rash or itching.  CARDIOVASCULAR: No chest pain, chest pressure or chest discomfort. No palpitations or edema.  RESPIRATORY: No shortness of breath, cough or sputum.  GASTROINTESTINAL: No anorexia, nausea, vomiting. No changes in bowel habits. No abdominal  pain or blood.  GENITOURINARY: No dysuria. No frequency. No discharge. NEUROLOGICAL: No headache, dizziness, syncope, paralysis, ataxia, numbness or tingling in the extremities. No memory changes. No change in bowel or bladder control.  MUSCULOSKELETAL: Yes neck joint pain. Yes neck muscle pain. HEMATOLOGIC: No anemia, bleeding or bruising.  LYMPHATICS: No enlarged lymph nodes.  PSYCHIATRIC: No change in mood. No change in sleep pattern.  ENDOCRINOLOGIC: No reports of sweating, cold or heat intolerance. No polyuria or  polydipsia.     Objective  Pulse 74  Temp(Src) 98.7 F (37.1 C) (Oral)  Resp 16  Wt 198 lb 12.8 oz (90.175 kg)  SpO2 98% Body mass index is 31.13 kg/(m^2).  Physical Exam  Constitutional: Patient is overweight and well-nourished. In no distress.  HEENT:  - Head: Normocephalic and atraumatic.  - Ears: Bilateral TMs gray, no erythema or effusion - Nose: Nasal mucosa moist - Mouth/Throat: Oropharynx is clear and moist. No tonsillar hypertrophy or erythema. No post nasal drainage.  - Eyes: Conjunctivae clear, EOM movements normal. PERRLA. No scleral icterus.  Neck: Normal range of motion. Neck supple. No JVD present. No thyromegaly present.  Cardiovascular: Normal rate, regular rhythm and normal heart sounds.  No murmur heard.  Pulmonary/Chest: Effort normal and breath sounds normal. No respiratory distress. Musculoskeletal: Normal range of motion bilateral UE and LE, no joint effusions. Cervical spine normal ROM with no palpable step off or tenderness. Some trapezius muscle tension noted. Negative spurling sign. Peripheral vascular: Bilateral LE no edema. Neurological: CN II-XII grossly intact with no focal deficits. Alert and oriented to person, place, and time. Coordination, balance, strength, speech and gait are normal.  Skin: Skin is warm and dry. No rash noted. No erythema.  Psychiatric: Patient has an anxious mood and affect. Behavior is normal in office today. Judgment and thought content normal in office today.   Assessment & Plan  1. Abnormal thyroid stimulating hormone (TSH) level Repeat lab testing.  - TSH - T3, free - T4, free - Thyroglobulin antibody - Thyroid stimulating immunoglobulin - Thyroid peroxidase antibody  2. Obesity, Class I, BMI 30-34.9 May be related to injectable contraceptive vs undiagnosed thyroid disorder.  3. Neck pain, musculoskeletal Home exercises instructed. We discussed potential pathology and long term risk of reoccurrence.  Maintaining an ideal body habitus, regular exercise, proper lifting techniques and mindfulness of exacerbating factors will be useful in long term management.  Consider concomitant therapy with PT, massage therapist or chiropractor. May use anti-inflammatory medication and muscle relaxer as needed.  - ibuprofen (ADVIL,MOTRIN) 800 MG tablet; Take 1 tablet (800 mg total) by mouth every 8 (eight) hours as needed.  Dispense: 50 tablet; Refill: 1 - cyclobenzaprine (FLEXERIL) 5 MG tablet; Take 1 tablet (5 mg total) by mouth at bedtime.  Dispense: 30 tablet; Refill: 1 - DG Cervical Spine 2 or 3 views; Future  4. Encounter for surveillance of injectable contraceptive She will discontinue and follow up to discuss alternative methods after we rule out thyroid disorder.

## 2015-01-11 LAB — T4, FREE: Free T4: 1.06 ng/dL (ref 0.82–1.77)

## 2015-01-11 LAB — TSH: TSH: 0.436 u[IU]/mL — AB (ref 0.450–4.500)

## 2015-01-11 LAB — THYROID PEROXIDASE ANTIBODY: Thyroperoxidase Ab SerPl-aCnc: 8 IU/mL (ref 0–34)

## 2015-01-11 LAB — THYROGLOBULIN ANTIBODY

## 2015-01-11 LAB — T3, FREE: T3 FREE: 2.8 pg/mL (ref 2.0–4.4)

## 2015-01-17 LAB — THYROID STIMULATING IMMUNOGLOBULIN: TSI: 41 % (ref 0–139)

## 2015-01-25 ENCOUNTER — Telehealth: Payer: Self-pay | Admitting: Family Medicine

## 2015-01-25 ENCOUNTER — Other Ambulatory Visit: Payer: Self-pay | Admitting: Family Medicine

## 2015-01-25 MED ORDER — NORGESTIMATE-ETH ESTRADIOL 0.25-35 MG-MCG PO TABS: 1.0000 | ORAL_TABLET | Freq: Every day | ORAL | Status: DC

## 2015-01-25 NOTE — Telephone Encounter (Signed)
Sent OCPs to [facility] on [address].

## 2015-01-25 NOTE — Telephone Encounter (Signed)
Patient is requesting a prescription for birthcontrol. She was on the depo and no longer wishes to do the injection, therefore, the 02-08-15 nurse appointment has been cancelled. Please send new prescription to [facility]

## 2015-02-08 ENCOUNTER — Ambulatory Visit: Payer: Medicaid Other

## 2015-04-12 ENCOUNTER — Ambulatory Visit (INDEPENDENT_AMBULATORY_CARE_PROVIDER_SITE_OTHER): Payer: Medicaid Other | Admitting: Urology

## 2015-04-12 VITALS — BP 117/80 | HR 69 | Ht 68.0 in | Wt 204.6 lb

## 2015-04-12 DIAGNOSIS — N393 Stress incontinence (female) (male): Secondary | ICD-10-CM

## 2015-04-12 DIAGNOSIS — IMO0002 Reserved for concepts with insufficient information to code with codable children: Secondary | ICD-10-CM

## 2015-04-12 DIAGNOSIS — R350 Frequency of micturition: Secondary | ICD-10-CM

## 2015-04-12 DIAGNOSIS — N8110 Cystocele, unspecified: Secondary | ICD-10-CM

## 2015-04-12 DIAGNOSIS — N3946 Mixed incontinence: Secondary | ICD-10-CM

## 2015-04-12 NOTE — Addendum Note (Signed)
Addended by: Martha ClanWATTS, SARAH M on: 04/12/2015 04:42 PM   Modules accepted: Orders

## 2015-04-12 NOTE — Progress Notes (Signed)
04/12/2015 4:16 PM   Andrea ColumbiaNaeemah K Rajewski 1970/10/25 [id number]  Referring provider: Edwena FeltyAshany Sundaram, MD [address] [place], [place] [id number]  Chief Complaint  Patient presents with  . Bladder Prolapse    HPI: The patient has mixed stress urge incontinence was more trouble by the urgency incontinence. She does not work pads but recently started were padded when she went to the gym. She denies enuresis. One treatment of physical therapy failed and she has not been tried on medication she does not feel vaginal bulging sensation.  She voids every hour depend upon fluid intake and cannot sit thru a 2 hour our movie. She describes small-volume frequency syndrome. Her flow is variable.  She denies history of previous G surgery kidney stones and she does not have urinary tract infections. She has no neurologic or infectious symptoms. She's not had a hysterectomy. Her bowel movements are normal  Modifying factors: There are no other modifying factors  Associated signs and symptoms: There are no other associated signs and symptoms Aggravating and relieving factors: There are no other aggravating or relieving factors Severity: Moderate Duration: Persistent   PMH: Past Medical History  Diagnosis Date  . High cholesterol   . Menorrhagia with irregular cycle   . Abnormal thyroid stimulating hormone level   . Encounter for initial prescription of injectable contraceptive   . Allergy   . Anemia   . Spider veins of both lower extremities   . Female stress incontinence     Surgical History: No past surgical history on file.  Home Medications:    Medication List       This list is accurate as of: 04/12/15  4:16 PM.  Always use your most recent med list.               cyclobenzaprine 5 MG tablet  Commonly known as:  FLEXERIL  Take 1 tablet (5 mg total) by mouth at bedtime.     fluticasone 50 MCG/ACT nasal spray  Commonly known as:  FLONASE  Place 2 sprays into  the nose daily.     ibuprofen 800 MG tablet  Commonly known as:  ADVIL,MOTRIN  Take 1 tablet (800 mg total) by mouth every 8 (eight) hours as needed.        Allergies: No Known Allergies  Family History: Family History  Problem Relation Age of Onset  . Hypertension Mother   . Thyroid disease Mother   . Hypertension Father   . Cancer Father     Prostate  . Diabetes Brother   . Diabetes Other   . Cancer Paternal Uncle     Prostate  . Diabetes Maternal Grandmother   . Cancer Paternal Grandfather     Prostate    Social History:  reports that she has never smoked. She has never used smokeless tobacco. She reports that she does not drink alcohol or use illicit drugs.  ROS: UROLOGY Frequent Urination?: Yes Hard to postpone urination?: Yes Burning/pain with urination?: No Get up at night to urinate?: Yes Leakage of urine?: Yes Urine stream starts and stops?: No Trouble starting stream?: No Do you have to strain to urinate?: No Blood in urine?: No Urinary tract infection?: No Sexually transmitted disease?: No Injury to kidneys or bladder?: No Painful intercourse?: No Weak stream?: No Currently pregnant?: No Vaginal bleeding?: No Last menstrual period?: n  Gastrointestinal Nausea?: No Vomiting?: No Indigestion/heartburn?: No Diarrhea?: No Constipation?: No  Constitutional Fever: No Night sweats?: No Weight loss?: No  Fatigue?: No  Skin Skin rash/lesions?: No Itching?: No  Eyes Blurred vision?: No Double vision?: No  Ears/Nose/Throat Sore throat?: No Sinus problems?: No  Hematologic/Lymphatic Swollen glands?: No Easy bruising?: No  Cardiovascular Leg swelling?: No Chest pain?: No  Respiratory Cough?: No Shortness of breath?: No  Endocrine Excessive thirst?: No  Musculoskeletal Back pain?: No Joint pain?: No  Neurological Headaches?: No Dizziness?: No  Psychologic Depression?: No Anxiety?: No  Physical Exam: BP 117/80 mmHg   Pulse 69  Ht  (1.727 m)  Wt 92.806 kg (204 lb 9.6 oz)  BMI 31.12 kg/m2  Constitutional:  Alert and oriented, No acute distress. HEENT: [place] AT, moist mucus membranes.  Trachea midline, no masses. Cardiovascular: No clubbing, cyanosis, or edema. Respiratory: Normal respiratory effort, no increased work of breathing. GI: Abdomen is soft, nontender, nondistended, no abdominal masses GU: Grade 1 cystoceleincontinence. Small grade 1 rectocele Skin: No rashes, bruises or suspicious lesions. Lymph: No cervical or inguinal adenopathy. Neurologic: Grossly intact, no focal deficits, moving all 4 extremities. Psychiatric: Normal mood and affect.  Laboratory Data: Lab Results  Component Value Date   WBC 4.4 05/18/2010   HGB 13.9 03/17/2011   HCT 41.0 03/17/2011   MCV 91.9 05/18/2010   PLT 330 05/18/2010    Lab Results  Component Value Date   CREATININE 0.80 03/17/2011    No results found for: PSA  No results found for: TESTOSTERONE  Lab Results  Component Value Date   HGBA1C 5.7* 04/25/2010    Urinalysis    Component Value Date/Time   COLORURINE YELLOW 11/11/2007 2139   APPEARANCEUR CLEAR 11/11/2007 2139   LABSPEC 1.015 11/11/2007 2139   PHURINE 5.5 11/11/2007 2139   GLUCOSEU NEGATIVE 11/11/2007 2139   HGBUR NEGATIVE 11/11/2007 2139   BILIRUBINUR NEGATIVE 11/11/2007 2139   KETONESUR NEGATIVE 11/11/2007 2139   PROTEINUR NEGATIVE 11/11/2007 2139   UROBILINOGEN 1.0 11/11/2007 2139   NITRITE NEGATIVE 11/11/2007 2139   LEUKOCYTESUR SMALL* 11/11/2007 2139    Pertinent Imaging: None  Assessment & Plan:  The patient has mixed stress urge incontinence and now is starting to wear a pad at the gym. She is primarily bothered by urge incontinence. She is very fluid-dependent urinary frequency. She's not been tried on medication.  The patient in my opinion has an overactive bladder. I'll try to help her with medical therapy. This is not reacher treatment goal I'll order  urodynamics. In my opinion the cystocele is not clinically significant this stage.  Due to sampler gave her Mirabegron 50 mg 5 weeks and see in 4 weeks  1. Cystocele 2. Urinary frequency 3. Mixed stress and urge incontinence - Urinalysis, Complete   Martina Sinner, MD  [facility] [address], [address] [place], [place] [address] [phone]

## 2015-04-12 NOTE — Addendum Note (Signed)
Addended by: Martha ClanWATTS, SARAH M on: 04/12/2015 05:03 PM   Modules accepted: Orders

## 2015-04-14 ENCOUNTER — Ambulatory Visit: Payer: Medicaid Other | Admitting: Urology

## 2015-05-12 ENCOUNTER — Ambulatory Visit (INDEPENDENT_AMBULATORY_CARE_PROVIDER_SITE_OTHER): Payer: Medicaid Other | Admitting: Family Medicine

## 2015-05-12 ENCOUNTER — Encounter: Payer: Self-pay | Admitting: Family Medicine

## 2015-05-12 VITALS — BP 136/82 | HR 80 | Temp 100.4°F | Resp 18 | Ht 68.0 in | Wt 203.8 lb

## 2015-05-12 DIAGNOSIS — R509 Fever, unspecified: Secondary | ICD-10-CM | POA: Diagnosis not present

## 2015-05-12 DIAGNOSIS — H6501 Acute serous otitis media, right ear: Secondary | ICD-10-CM | POA: Diagnosis not present

## 2015-05-12 DIAGNOSIS — H6500 Acute serous otitis media, unspecified ear: Secondary | ICD-10-CM | POA: Insufficient documentation

## 2015-05-12 LAB — POCT INFLUENZA A/B
INFLUENZA A, POC: NEGATIVE
INFLUENZA B, POC: NEGATIVE

## 2015-05-12 MED ORDER — AMOXICILLIN-POT CLAVULANATE 875-125 MG PO TABS: 1.0000 | ORAL_TABLET | Freq: Two times a day (BID) | ORAL | Status: DC

## 2015-05-12 NOTE — Patient Instructions (Signed)

## 2015-05-12 NOTE — Progress Notes (Signed)
Name: Andrea Pope   MRN: [id number]    DOB: Sep 07, 1970   Date:05/12/2015       Progress Note  Subjective  Chief Complaint  Chief Complaint  Patient presents with  . URI    HPI  Patient is here today with concerns regarding the following symptoms sore throat, congestion, post nasal drip, sneezing, ear pressure, non productive cough, achiness and low grade fevers that started 3 days ago.  Associated with chills, sweats, fatigue, malaise. Has tried the following home remedies: ibprofen, Robatussion   Past Medical History  Diagnosis Date  . High cholesterol   . Menorrhagia with irregular cycle   . Abnormal thyroid stimulating hormone level   . Encounter for initial prescription of injectable contraceptive   . Allergy   . Anemia   . Spider veins of both lower extremities   . Female stress incontinence     Social History  Substance Use Topics  . Smoking status: Never Smoker   . Smokeless tobacco: Never Used  . Alcohol Use: No     Current outpatient prescriptions:  .  cyclobenzaprine (FLEXERIL) 5 MG tablet, Take 1 tablet (5 mg total) by mouth at bedtime. (Patient not taking: Reported on 04/12/2015), Disp: 30 tablet, Rfl: 1 .  fluticasone (FLONASE) 50 MCG/ACT nasal spray, Place 2 sprays into the nose daily., Disp: , Rfl:  .  ibuprofen (ADVIL,MOTRIN) 800 MG tablet, Take 1 tablet (800 mg total) by mouth every 8 (eight) hours as needed. (Patient not taking: Reported on 04/12/2015), Disp: 50 tablet, Rfl: 1  No Known Allergies  ROS  Positive for fatigue, nasal congestion, sinus pressure, ear fullness as mentioned in HPI, otherwise all systems reviewed and are negative.  Objective  Filed Vitals:   05/12/15 1545  BP: 136/82  Pulse: 80  Temp: 100.4 F (38 C)  TempSrc: Oral  Resp: 18  Height: 5\' 8"  (1.727 m)  Weight: 203 lb 12.8 oz (92.443 kg)  SpO2: 98%   Body mass index is 30.99 kg/(m^2).   Physical Exam  Constitutional: Patient appears well-developed and  well-nourished. In no acute distress but does appear to be fatigued from acute illness. HEENT:  - Head: Normocephalic and atraumatic.  - Ears: RIGHT TM bulging with erythema and clear exudate, LEFT TM bulging with minimal clear exudate.  - Nose: Nasal mucosa boggy and congested.  - Mouth/Throat: Oropharynx is moist with slight erythema of bilateral tonsils without hypertrophy or exudates. Post nasal drainage present.  - Eyes: Conjunctivae clear, EOM movements normal. PERRLA. No scleral icterus.  Neck: Normal range of motion. Neck supple. No JVD present. No thyromegaly present. No local lymphadenopathy. Cardiovascular: Regular rate, regular rhythm with no murmurs heard.  Pulmonary/Chest: Effort normal and breath sounds clear in all lung fields.  Musculoskeletal: Normal range of motion bilateral UE and LE, no joint effusions. Skin: Skin is warm and dry. No rash noted. Psychiatric: Patient has a normal mood and affect. Behavior is normal in office today. Judgment and thought content normal in office today.  Results for orders placed or performed in visit on 05/12/15 (from the past 24 hour(s))  POCT Influenza A/B     Status: Normal   Collection Time: 05/12/15  3:57 PM  Result Value Ref Range   Influenza A, POC Negative Negative   Influenza B, POC Negative Negative    Assessment & Plan  1. Right acute serous otitis media, recurrence not specified Etiologies include initial allergic rhinitis or viral infection progressing to superimposed bacterial infection. Instructed  patient on increasing hydration, nasal saline spray, steam inhalation, NSAID if tolerated and not contraindicated. Start antibiotic therapy.  - amoxicillin-clavulanate (AUGMENTIN) 875-125 MG tablet; Take 1 tablet by mouth 2 (two) times daily.  Dispense: 20 tablet; Refill: 0  2. Other specified fever Negative for rapid flu testing.  - POCT Influenza A/B

## 2015-05-19 ENCOUNTER — Ambulatory Visit: Payer: Medicaid Other | Admitting: Urology

## 2015-06-16 ENCOUNTER — Ambulatory Visit: Payer: Medicaid Other | Admitting: Urology

## 2015-06-16 ENCOUNTER — Encounter: Payer: Self-pay | Admitting: Urology

## 2015-07-12 ENCOUNTER — Ambulatory Visit: Payer: Medicaid Other | Admitting: Family Medicine

## 2015-07-19 ENCOUNTER — Encounter: Payer: Self-pay | Admitting: Family Medicine

## 2015-07-19 ENCOUNTER — Ambulatory Visit (INDEPENDENT_AMBULATORY_CARE_PROVIDER_SITE_OTHER): Payer: Medicaid Other | Admitting: Family Medicine

## 2015-07-19 VITALS — BP 116/78 | HR 88 | Temp 98.6°F | Resp 14 | Ht 68.0 in | Wt 204.0 lb

## 2015-07-19 DIAGNOSIS — S8391XA Sprain of unspecified site of right knee, initial encounter: Secondary | ICD-10-CM | POA: Diagnosis not present

## 2015-07-19 NOTE — Patient Instructions (Signed)

## 2015-07-19 NOTE — Progress Notes (Signed)
Name: Andrea Pope   MRN: [id number]    DOB: 1970-11-30   Date:07/19/2015       Progress Note  Subjective  Chief Complaint  Chief Complaint  Patient presents with  . Knee Pain    right, onset 1 month ago while working out at the gym doing leg press heard something pop.  Had some swelling, but getting better    HPI  Andrea Pope is a 45 year old female with concerns of right knee pain duration 1 month. Onset at gym while doing leg press. Pop sensation heard during event. Had some swelling anterior at time of event but resolved few days after. Does not affect ambulation.    Past Medical History  Diagnosis Date  . High cholesterol   . Menorrhagia with irregular cycle   . Abnormal thyroid stimulating hormone level   . Encounter for initial prescription of injectable contraceptive   . Allergy   . Anemia   . Spider veins of both lower extremities   . Female stress incontinence     Patient Active Problem List   Diagnosis Date Noted  . Other specified fever 05/12/2015  . Acute serous otitis media 05/12/2015  . Abnormal thyroid stimulating hormone (TSH) level 01/10/2015  . Allergic rhinitis, seasonal 01/10/2015  . History of anemia 01/10/2015  . Excessive and frequent menstruation with irregular cycle 01/10/2015  . Asymptomatic varicose veins of both lower extremities 01/10/2015  . Encounter for surveillance of injectable contraceptive 01/10/2015  . Obesity, Class I, BMI 30-34.9 01/10/2015  . Neck pain, musculoskeletal 01/10/2015  . Hand numbness 09/28/2014  . Hand weakness 09/28/2014    Social History  Substance Use Topics  . Smoking status: Never Smoker   . Smokeless tobacco: Never Used  . Alcohol Use: No     Current outpatient prescriptions:  .  fluticasone (FLONASE) 50 MCG/ACT nasal spray, Place 2 sprays into the nose daily., Disp: , Rfl:  .  ibuprofen (ADVIL,MOTRIN) 800 MG tablet, Take 1 tablet (800 mg total) by mouth every 8 (eight) hours as needed. (Patient  not taking: Reported on 04/12/2015), Disp: 50 tablet, Rfl: 1  No past surgical history on file.  Family History  Problem Relation Age of Onset  . Hypertension Mother   . Thyroid disease Mother   . Hypertension Father   . Cancer Father     Prostate  . Diabetes Brother   . Diabetes Other   . Cancer Paternal Uncle     Prostate  . Diabetes Maternal Grandmother   . Cancer Paternal Grandfather     Prostate    No Known Allergies  Review of Systems  Positive for right knee as mentioned in HPI, otherwise all systems reviewed and are negative.  Objective  BP 116/78 mmHg  Pulse 88  Temp(Src) 98.6 F (37 C) (Oral)  Resp 14  Ht  (1.727 m)  Wt 204 lb (92.534 kg)  BMI 31.03 kg/m2  SpO2 97%  Body mass index is 31.03 kg/(m^2).   Physical Exam  Constitutional: Patient appears overweight and well-nourished. In no distress.  Cardiovascular: Normal rate, regular rhythm and normal heart sounds.  No murmur heard.  Pulmonary/Chest: Effort normal and breath sounds normal. No respiratory distress. Musculoskeletal: Normal range of motion bilateral UE and LE, no joint effusions. Right knee full ROM with active and passive motion, no laxity, negative varus or valgus straining, negative drawer testing.  Peripheral vascular: Bilateral LE no edema. Neurological: CN II-XII grossly intact with no focal deficits.  Alert and oriented to person, place, and time. Coordination, balance, strength, speech and gait are normal.  Skin: Skin is warm and dry. No rash noted. No erythema.  Psychiatric: Patient has a normal mood and affect. Behavior is normal in office today. Judgment and thought content normal in office today.    Assessment & Plan   1. Right knee sprain, initial encounter Knee sleeve, PT, prn ibuprofen. If symptoms persist or worsen with referral to Ortho.  - Ambulatory referral to Physical Therapy

## 2015-08-09 ENCOUNTER — Ambulatory Visit: Payer: Medicaid Other | Admitting: Physical Therapy

## 2015-08-10 ENCOUNTER — Ambulatory Visit: Payer: Medicaid Other | Admitting: Physical Therapy

## 2015-08-14 ENCOUNTER — Ambulatory Visit (INDEPENDENT_AMBULATORY_CARE_PROVIDER_SITE_OTHER): Payer: Medicaid Other | Admitting: Family Medicine

## 2015-08-14 ENCOUNTER — Encounter: Payer: Self-pay | Admitting: Family Medicine

## 2015-08-14 VITALS — BP 118/72 | HR 77 | Temp 99.9°F | Resp 14 | Wt 205.0 lb

## 2015-08-14 DIAGNOSIS — R809 Proteinuria, unspecified: Secondary | ICD-10-CM | POA: Diagnosis not present

## 2015-08-14 DIAGNOSIS — E8809 Other disorders of plasma-protein metabolism, not elsewhere classified: Secondary | ICD-10-CM | POA: Insufficient documentation

## 2015-08-14 DIAGNOSIS — Z862 Personal history of diseases of the blood and blood-forming organs and certain disorders involving the immune mechanism: Secondary | ICD-10-CM | POA: Diagnosis not present

## 2015-08-14 DIAGNOSIS — R7989 Other specified abnormal findings of blood chemistry: Secondary | ICD-10-CM

## 2015-08-14 DIAGNOSIS — R635 Abnormal weight gain: Secondary | ICD-10-CM | POA: Diagnosis not present

## 2015-08-14 HISTORY — DX: Proteinuria, unspecified: R80.9

## 2015-08-14 LAB — POCT URINALYSIS DIPSTICK
Bilirubin, UA: NEGATIVE
Blood, UA: NEGATIVE
Glucose, UA: NEGATIVE
KETONES UA: NEGATIVE
LEUKOCYTES UA: NEGATIVE
NITRITE UA: NEGATIVE
PH UA: 5.5
PROTEIN UA: NEGATIVE
Spec Grav, UA: 1.015
UROBILINOGEN UA: 0.2

## 2015-08-14 LAB — POCT UA - MICROALBUMIN: Microalbumin Ur, POC: NEGATIVE mg/L

## 2015-08-14 NOTE — Assessment & Plan Note (Signed)
recheck

## 2015-08-14 NOTE — Progress Notes (Signed)
BP 118/72 mmHg  Pulse 77  Temp(Src) 99.9 F (37.7 C) (Oral)  Resp 14  Wt 205 lb (92.987 kg)  SpO2 98%   Subjective:    Patient ID: Andrea Pope, female    DOB: Sep 17, 1970, 45 y.o.   MRN: [id number]  HPI: Andrea Pope is a 45 y.o. female  Chief Complaint  Patient presents with  . Edema    onset 1 month, patient denies any pain.  she states had to take rings off and socks leave imprints.  She gave plasma last week and they told her her protein was low.   patient is here with c/o edema; she is new to me; her previous provider left the practice  She has been gaining weight She gained 30 pounds over the last year Snoring Never gained weight like this Hypothyroidism in the family, mother and grandmother Mother kidney disease Sometimes sees foamy urine in the toilet Having excessive urination; has prolapsed bladder, 4 babies, one almost 8 pounds Not vegan or vegetarian  Relevant past medical, surgical, family and social history reviewed and updated as indicated Interim medical history since last visit reviewed.  Past Medical History  Diagnosis Date  . High cholesterol   . Menorrhagia with irregular cycle   . Abnormal thyroid stimulating hormone level   . Encounter for initial prescription of injectable contraceptive   . Allergy   . Anemia   . Spider veins of both lower extremities   . Female stress incontinence    No past surgical history on file.  Family History  Problem Relation Age of Onset  . Hypertension Mother   . Thyroid disease Mother   . Hypertension Father   . Cancer Father     Prostate  . Diabetes Brother   . Diabetes Other   . Cancer Paternal Uncle     Prostate  . Diabetes Maternal Grandmother   . Cancer Paternal Grandfather     Prostate   Social History  Substance Use Topics  . Smoking status: Never Smoker   . Smokeless tobacco: Never Used  . Alcohol Use: No    Allergies and medications reviewed  Review of Systems Per HPI  unless specifically indicated above     Objective:    BP 118/72 mmHg  Pulse 77  Temp(Src) 99.9 F (37.7 C) (Oral)  Resp 14  Wt 205 lb (92.987 kg)  SpO2 98%  Wt Readings from Last 3 Encounters:  08/14/15 205 lb (92.987 kg)  07/19/15 204 lb (92.534 kg)  05/12/15 203 lb 12.8 oz (92.443 kg)    Physical Exam  Constitutional: She appears well-developed and well-nourished. No distress.  HENT:  Head: Normocephalic and atraumatic.  Eyes: EOM are normal. No scleral icterus.  Neck: No thyromegaly present.  Cardiovascular: Normal rate, regular rhythm and normal heart sounds.   No murmur heard. Pulmonary/Chest: Effort normal and breath sounds normal. No respiratory distress. She has no wheezes.  Abdominal: Soft. Bowel sounds are normal. She exhibits no distension.  Musculoskeletal: Normal range of motion. She exhibits no edema.  Neurological: She is alert. She exhibits normal muscle tone.  Skin: Skin is warm and dry. She is not diaphoretic. No pallor.  Psychiatric: She has a normal mood and affect. Her behavior is normal. Judgment and thought content normal.      Assessment & Plan:   Problem List Items Addressed This Visit      Other   Abnormal thyroid stimulating hormone (TSH) level    recheck  Relevant Orders   TSH (Completed)   T4, free (Completed)   History of anemia    Check CBC      Relevant Orders   CBC (Completed)   Hypoalbuminemia - Primary   Relevant Orders   POCT urinalysis dipstick (Completed)   Comprehensive metabolic panel (Completed)   POCT UA - Microalbumin (Completed)   Proteinuria    Noted on previous labs; recheck today      Relevant Orders   POCT urinalysis dipstick (Completed)   POCT UA - Microalbumin (Completed)   Weight gain    Will check thyroid function, but also albumin, urine for protein to see if third spacing due to hypoalbuminemia      Relevant Orders   TSH (Completed)   T4, free (Completed)      Follow up plan: No  Follow-up on file.  An after-visit summary was printed and given to the patient at check-out.  Please see the patient instructions which may contain other information and recommendations beyond what is mentioned above in the assessment and plan. Orders Placed This Encounter  Procedures  . Comprehensive metabolic panel  . CBC  . TSH  . T4, free  . POCT urinalysis dipstick  . POCT UA - Microalbumin

## 2015-08-14 NOTE — Assessment & Plan Note (Signed)
Check CBC 

## 2015-08-14 NOTE — Patient Instructions (Addendum)
Please have labs done today or tomorrow and then go from there If you need something for aches or pains, try to use Tylenol (acetaminphen) instead of non-steroidals (which include Aleve, ibuprofen, Advil, Motrin, and naproxen); non-steroidals can cause long-term kidney damage Diabetics should have their urines checked once a year for microalbumin (protein)

## 2015-08-16 LAB — COMPREHENSIVE METABOLIC PANEL
ALK PHOS: 56 IU/L (ref 39–117)
ALT: 22 IU/L (ref 0–32)
AST: 15 IU/L (ref 0–40)
Albumin/Globulin Ratio: 1.6 (ref 1.2–2.2)
Albumin: 3.8 g/dL (ref 3.5–5.5)
BUN/Creatinine Ratio: 12 (ref 9–23)
BUN: 10 mg/dL (ref 6–24)
Bilirubin Total: 0.3 mg/dL (ref 0.0–1.2)
CALCIUM: 9.1 mg/dL (ref 8.7–10.2)
CO2: 22 mmol/L (ref 18–29)
CREATININE: 0.82 mg/dL (ref 0.57–1.00)
Chloride: 104 mmol/L (ref 96–106)
GFR calc Af Amer: 102 mL/min/{1.73_m2} (ref >59)
GFR calc non Af Amer: 89 mL/min/{1.73_m2} (ref >59)
Globulin, Total: 2.4 g/dL (ref 1.5–4.5)
Glucose: 78 mg/dL (ref 65–99)
Potassium: 4.3 mmol/L (ref 3.5–5.2)
SODIUM: 141 mmol/L (ref 134–144)
Total Protein: 6.2 g/dL (ref 6.0–8.5)

## 2015-08-16 LAB — CBC
HEMATOCRIT: 40 % (ref 34.0–46.6)
Hemoglobin: 13.4 g/dL (ref 11.1–15.9)
MCH: 30.3 pg (ref 26.6–33.0)
MCHC: 33.5 g/dL (ref 31.5–35.7)
MCV: 91 fL (ref 79–97)
PLATELETS: 346 10*3/uL (ref 150–379)
RBC: 4.42 x10E6/uL (ref 3.77–5.28)
RDW: 14 % (ref 12.3–15.4)
WBC: 4 10*3/uL (ref 3.4–10.8)

## 2015-08-16 LAB — T4, FREE: Free T4: 0.98 ng/dL (ref 0.82–1.77)

## 2015-08-16 LAB — TSH: TSH: 0.481 u[IU]/mL (ref 0.450–4.500)

## 2015-08-18 ENCOUNTER — Encounter: Payer: Self-pay | Admitting: Family Medicine

## 2015-08-21 ENCOUNTER — Telehealth: Payer: Self-pay

## 2015-08-21 NOTE — Telephone Encounter (Signed)
I responded through MyChart; no fluid pill needed

## 2015-08-21 NOTE — Telephone Encounter (Signed)
Patient called states after blood work came back, you were going to decide if she still needed fluid pill?

## 2015-09-03 NOTE — Assessment & Plan Note (Signed)
Noted on previous labs; recheck today

## 2015-09-03 NOTE — Assessment & Plan Note (Signed)
Will check thyroid function, but also albumin, urine for protein to see if third spacing due to hypoalbuminemia

## 2015-09-18 ENCOUNTER — Telehealth: Payer: Self-pay | Admitting: Family Medicine

## 2015-09-18 DIAGNOSIS — Z1239 Encounter for other screening for malignant neoplasm of breast: Secondary | ICD-10-CM

## 2015-09-18 NOTE — Telephone Encounter (Signed)
Last seen in April: patient called [facility] to get appointment for mammogram and they told her that she will need a new order. Give call once complete.

## 2015-09-21 DIAGNOSIS — Z1239 Encounter for other screening for malignant neoplasm of breast: Secondary | ICD-10-CM | POA: Insufficient documentation

## 2015-09-21 NOTE — Telephone Encounter (Signed)
I can't close out the order; is there any chance she could be pregnant? LMP? Sexually active? Order won't let me close without verifying pregnancy status Thank you

## 2015-09-22 NOTE — Telephone Encounter (Signed)
Great; thank you; order entered and done

## 2015-09-22 NOTE — Telephone Encounter (Signed)
LMP:  Has not had since last April was on depo.  She states is sexually active and is not pregnant

## 2015-10-25 ENCOUNTER — Ambulatory Visit
Admission: RE | Admit: 2015-10-25 | Discharge: 2015-10-25 | Disposition: A | Discharge status: Home / Self Care | Payer: Medicaid Other | Source: Ambulatory Visit | Attending: Family Medicine | Admitting: Family Medicine

## 2015-10-25 DIAGNOSIS — Z1239 Encounter for other screening for malignant neoplasm of breast: Secondary | ICD-10-CM

## 2015-10-25 DIAGNOSIS — Z1231 Encounter for screening mammogram for malignant neoplasm of breast: Secondary | ICD-10-CM | POA: Diagnosis present

## 2015-11-20 ENCOUNTER — Ambulatory Visit: Payer: Medicaid Other

## 2015-11-20 DIAGNOSIS — Z111 Encounter for screening for respiratory tuberculosis: Secondary | ICD-10-CM

## 2015-11-20 MED ORDER — TUBERCULIN PPD 5 UNIT/0.1ML ID SOLN
5.0000 [IU] | Freq: Once | INTRADERMAL | Status: AC
  Administered 2015-11-20: 5 [IU] via INTRADERMAL

## 2015-11-29 ENCOUNTER — Encounter: Payer: Self-pay | Admitting: Family Medicine

## 2015-11-29 ENCOUNTER — Ambulatory Visit (INDEPENDENT_AMBULATORY_CARE_PROVIDER_SITE_OTHER): Payer: Medicaid Other | Admitting: Family Medicine

## 2015-11-29 VITALS — BP 134/80 | HR 75 | Temp 99.3°F | Resp 14 | Wt 202.0 lb

## 2015-11-29 DIAGNOSIS — N921 Excessive and frequent menstruation with irregular cycle: Secondary | ICD-10-CM | POA: Diagnosis not present

## 2015-11-29 DIAGNOSIS — Z8742 Personal history of other diseases of the female genital tract: Secondary | ICD-10-CM

## 2015-11-29 DIAGNOSIS — R109 Unspecified abdominal pain: Secondary | ICD-10-CM | POA: Insufficient documentation

## 2015-11-29 DIAGNOSIS — Z862 Personal history of diseases of the blood and blood-forming organs and certain disorders involving the immune mechanism: Secondary | ICD-10-CM | POA: Diagnosis not present

## 2015-11-29 DIAGNOSIS — Z8759 Personal history of other complications of pregnancy, childbirth and the puerperium: Secondary | ICD-10-CM | POA: Insufficient documentation

## 2015-11-29 DIAGNOSIS — R7989 Other specified abnormal findings of blood chemistry: Secondary | ICD-10-CM

## 2015-11-29 DIAGNOSIS — F330 Major depressive disorder, recurrent, mild: Secondary | ICD-10-CM | POA: Diagnosis not present

## 2015-11-29 DIAGNOSIS — N83209 Unspecified ovarian cyst, unspecified side: Secondary | ICD-10-CM

## 2015-11-29 DIAGNOSIS — N926 Irregular menstruation, unspecified: Secondary | ICD-10-CM

## 2015-11-29 DIAGNOSIS — R1084 Generalized abdominal pain: Secondary | ICD-10-CM | POA: Diagnosis not present

## 2015-11-29 HISTORY — DX: Unspecified ovarian cyst, unspecified side: N83.209

## 2015-11-29 HISTORY — DX: Personal history of other complications of pregnancy, childbirth and the puerperium: Z87.59

## 2015-11-29 LAB — COMPREHENSIVE METABOLIC PANEL
ALBUMIN: 4 g/dL (ref 3.6–5.1)
ALK PHOS: 49 U/L (ref 33–115)
ALT: 17 U/L (ref 6–29)
AST: 15 U/L (ref 10–30)
BUN: 10 mg/dL (ref 7–25)
CHLORIDE: 104 mmol/L (ref 98–110)
CO2: 23 mmol/L (ref 20–31)
CREATININE: 0.87 mg/dL (ref 0.50–1.10)
Calcium: 9.1 mg/dL (ref 8.6–10.2)
Glucose, Bld: 84 mg/dL (ref 65–99)
POTASSIUM: 4.2 mmol/L (ref 3.5–5.3)
Sodium: 138 mmol/L (ref 135–146)
TOTAL PROTEIN: 6.9 g/dL (ref 6.1–8.1)
Total Bilirubin: 0.5 mg/dL (ref 0.2–1.2)

## 2015-11-29 LAB — CBC WITH DIFFERENTIAL/PLATELET
BASOS PCT: 0 %
Basophils Absolute: 0 cells/uL (ref 0–200)
EOS PCT: 8 %
Eosinophils Absolute: 312 cells/uL (ref 15–500)
HEMATOCRIT: 40.8 % (ref 35.0–45.0)
HEMOGLOBIN: 13.7 g/dL (ref 11.7–15.5)
LYMPHS ABS: 1872 {cells}/uL (ref 850–3900)
LYMPHS PCT: 48 %
MCH: 30.3 pg (ref 27.0–33.0)
MCHC: 33.6 g/dL (ref 32.0–36.0)
MCV: 90.3 fL (ref 80.0–100.0)
MONO ABS: 351 {cells}/uL (ref 200–950)
MPV: 10.5 fL (ref 7.5–12.5)
Monocytes Relative: 9 %
NEUTROS PCT: 35 %
Neutro Abs: 1365 cells/uL — ABNORMAL LOW (ref 1500–7800)
Platelets: 322 10*3/uL (ref 140–400)
RBC: 4.52 MIL/uL (ref 3.80–5.10)
RDW: 13.5 % (ref 11.0–15.0)
WBC: 3.9 10*3/uL (ref 3.8–10.8)

## 2015-11-29 LAB — LUTEINIZING HORMONE: LH: 7.1 m[IU]/mL

## 2015-11-29 LAB — T4, FREE: Free T4: 1 ng/dL (ref 0.8–1.8)

## 2015-11-29 LAB — TSH: TSH: 0.56 mIU/L

## 2015-11-29 LAB — POCT URINE PREGNANCY: Preg Test, Ur: NEGATIVE

## 2015-11-29 LAB — FOLLICLE STIMULATING HORMONE: FSH: 6.1 m[IU]/mL

## 2015-11-29 MED ORDER — ESCITALOPRAM OXALATE 10 MG PO TABS: 10.0000 mg | ORAL_TABLET | Freq: Every day | ORAL | 0 refills | Status: DC

## 2015-11-29 NOTE — Assessment & Plan Note (Signed)
Check TSH and free T4 

## 2015-11-29 NOTE — Patient Instructions (Signed)
Start the new medicine for your mood If you develop dark thoughts on the medicine, please seek help right away Follow-up with me in 3-4 weeks to see how it's working and if dose needs to be adjusted Let's get labs and ultrasounds to work up your symptoms If pain gets severe, then go to the ER or urgent care Be aware of the possibility of unintended pregnancy, even if you aren't having regular periods

## 2015-11-29 NOTE — Assessment & Plan Note (Signed)
Check cbc 

## 2015-11-29 NOTE — Assessment & Plan Note (Signed)
Noted on prior imaging, test results; urine pregnancy today negative

## 2015-11-29 NOTE — Assessment & Plan Note (Signed)
Check liver function tests; will get [place] of abd and pelvis

## 2015-11-29 NOTE — Assessment & Plan Note (Signed)
Will check urine pregnancy, pelvic US; refer to GYN if not obvious etiology

## 2015-11-29 NOTE — Progress Notes (Signed)
BP 134/80   Pulse 75   Temp 99.3 F (37.4 C) (Oral)   Resp 14   Wt 202 lb (91.6 kg)   LMP 08/25/2013   SpO2 95%   BMI 30.71 kg/m    MD note: LMP not correct above; LMP November 08, 2015  Subjective:    Patient ID: Andrea Pope, female    DOB: 02-13-1971, 45 y.o.   MRN: [id number]  HPI: Andrea Pope is a 45 y.o. female  Chief Complaint  Patient presents with  . abnormal bleeding    spotting   Patient is here for an acute visit; has been spotting for a few weeks She took a depo shot about a year ago, over a year ago; just didn't continue, was putting on a lot of weight, 30 pounds Then started to bleed around July 5th No other bleeding since the depo She has been having pelvic pain, almost too high to be cramps; on and off for three months No abnormal discharge, no dysuria, no blood in the urine Weight reviewed; 30 pounds over the last year; feeling tired; no hair loss, no dry skin, no change in bowels; no skipped heart beats; more depressed, would like something; used to prozac a long time ago, did well; no manic episodes; sister has paranoid schizophrenic I searched through previous US reports and she appears to have had an ectopic pregnancy in March 2008, left adnexal mass with positive pregnancy test; treated medically; did have a cyst on ovary, not sure if left or right, Dr. Sherley Bounds ordered that Mother had a complete hysterectomy, so not sure how early she would have gone through; mother has hypothyroidism Father being treated for prostate cancer  Depression screen [facility] 2/9 11/29/2015 07/19/2015 05/12/2015 01/10/2015  Decreased Interest 0 0 0 0  Down, Depressed, Hopeless 1 0 0 0  PHQ - 2 Score 1 0 0 0    No flowsheet data found.  Relevant past medical, surgical, family and social history reviewed Past Medical History:  Diagnosis Date  . Abnormal thyroid stimulating hormone level   . Allergy   . Anemia   . Encounter for initial prescription of injectable  contraceptive   . Female stress incontinence   . High cholesterol   . History of ectopic pregnancy 11/29/2015  . Menorrhagia with irregular cycle   . Spider veins of both lower extremities    History reviewed. No pertinent surgical history.   Family History  Problem Relation Age of Onset  . Hypertension Mother   . Thyroid disease Mother   . Hypertension Father   . Cancer Father     Prostate  . Diabetes Brother   . Diabetes Other   . Cancer Paternal Uncle     Prostate  . Diabetes Maternal Grandmother   . Cancer Paternal Grandfather     Prostate  . Breast cancer Neg Hx    Social History  Substance Use Topics  . Smoking status: Never Smoker  . Smokeless tobacco: Never Used  . Alcohol use No   Interim medical history since last visit reviewed. Allergies and medications reviewed  Review of Systems Per HPI unless specifically indicated above     Objective:    BP 134/80   Pulse 75   Temp 99.3 F (37.4 C) (Oral)   Resp 14   Wt 202 lb (91.6 kg)   LMP 08/25/2013   SpO2 95%   BMI 30.71 kg/m   Wt Readings from Last 3 Encounters:  11/29/15  202 lb (91.6 kg)  08/14/15 205 lb (93 kg)  07/19/15 204 lb (92.5 kg)    Physical Exam  Constitutional: She appears well-developed and well-nourished. No distress.  HENT:  Head: Normocephalic and atraumatic.  Eyes: EOM are normal. No scleral icterus.  Neck: No thyromegaly present.  Cardiovascular: Normal rate, regular rhythm and normal heart sounds.   No murmur heard. Pulmonary/Chest: Effort normal and breath sounds normal. No respiratory distress. She has no wheezes.  Abdominal: Soft. Bowel sounds are normal. She exhibits no distension and no mass. There is no tenderness. There is no guarding.  Genitourinary: There is no rash on the right labia. There is no rash on the left labia. Uterus is not enlarged and not tender. Cervix exhibits no discharge. Right adnexum displays no mass, no tenderness and no fullness. Left adnexum  displays no mass, no tenderness and no fullness. No erythema or bleeding in the vagina. No vaginal discharge found.  Musculoskeletal: Normal range of motion. She exhibits no edema.  Neurological: She is alert. She exhibits normal muscle tone.  Skin: Skin is warm and dry. She is not diaphoretic. No pallor.  Psychiatric: Her behavior is normal. Judgment and thought content normal. Her mood appears not anxious. Her affect is not blunt and not inappropriate. Her speech is not delayed. She is not slowed. She exhibits a depressed mood (mildly). She expresses no homicidal and no suicidal ideation.  Good eye contact with examiner She is attentive.   Results for orders placed or performed in visit on 11/29/15  POCT urine pregnancy  Result Value Ref Range   Preg Test, Ur Negative Negative      Assessment & Plan:   Problem List Items Addressed This Visit      Other   Irregular periods/menstrual cycles    Check urine pregnancy, then check pelvic US and labs; discussed risk of unintended pregnancy even if not having regular periods, especially if not using contraception      Relevant Orders   POCT urine pregnancy (Completed)   TSH   T4, free   Luteinizing hormone   Follicle stimulating hormone   US Transvaginal Non-OB   US Pelvis Complete   Hx of ovarian cyst   Relevant Orders   US Transvaginal Non-OB   US Pelvis Complete   History of ectopic pregnancy    Noted on prior imaging, test results; urine pregnancy today negative      History of anemia    Check cbc      Relevant Orders   CBC with Differential/Platelet   Excessive and frequent menstruation with irregular cycle    Will check urine pregnancy, pelvic US; refer to GYN if not obvious etiology      Relevant Orders   US Transvaginal Non-OB   US Pelvis Complete   Abnormal thyroid stimulating hormone (TSH) level    Check TSH and free T4      Relevant Orders   TSH   T4, free   Abdominal pain    Check liver function tests;  will get [place] of abd and pelvis      Relevant Orders   Comprehensive Metabolic Panel (CMET)   CBC with Differential/Platelet   Urinalysis w microscopic + reflex cultur   US Abdomen Complete    Other Visit Diagnoses    Mild episode of recurrent major depressive disorder (HCC)    -  Primary   Relevant Medications   escitalopram (LEXAPRO) 10 MG tablet      Follow up  plan: Return 3-4 weeks, for medicine follow-up.  An after-visit summary was printed and given to the patient at check-out.  Please see the patient instructions which may contain other information and recommendations beyond what is mentioned above in the assessment and plan.  Meds ordered this encounter  Medications  . escitalopram (LEXAPRO) 10 MG tablet    Sig: Take 1 tablet (10 mg total) by mouth daily.    Dispense:  30 tablet    Refill:  0    Orders Placed This Encounter  Procedures  . US Transvaginal Non-OB  . US Pelvis Complete  . US Abdomen Complete  . Comprehensive Metabolic Panel (CMET)  . CBC with Differential/Platelet  . TSH  . T4, free  . Luteinizing hormone  . Follicle stimulating hormone  . Urinalysis w microscopic + reflex cultur  . POCT urine pregnancy

## 2015-11-29 NOTE — Assessment & Plan Note (Addendum)
Check urine pregnancy, then check pelvic US and labs; discussed risk of unintended pregnancy even if not having regular periods, especially if not using contraception

## 2015-11-30 LAB — URINALYSIS W MICROSCOPIC + REFLEX CULTURE
BILIRUBIN URINE: NEGATIVE
Bacteria, UA: NONE SEEN [HPF]
Casts: NONE SEEN [LPF]
Crystals: NONE SEEN [HPF]
Glucose, UA: NEGATIVE
Hgb urine dipstick: NEGATIVE
KETONES UR: NEGATIVE
LEUKOCYTES UA: NEGATIVE
NITRITE: NEGATIVE
PH: 7.5 (ref 5.0–8.0)
Protein, ur: NEGATIVE
RBC / HPF: NONE SEEN RBC/HPF (ref <=2)
SPECIFIC GRAVITY, URINE: 1.019 (ref 1.001–1.035)
WBC UA: NONE SEEN WBC/HPF (ref <=5)
Yeast: NONE SEEN [HPF]

## 2015-12-05 ENCOUNTER — Ambulatory Visit
Admission: RE | Admit: 2015-12-05 | Discharge: 2015-12-05 | Disposition: A | Discharge status: Home / Self Care | Payer: Medicaid Other | Source: Ambulatory Visit | Attending: Family Medicine | Admitting: Family Medicine

## 2015-12-05 ENCOUNTER — Ambulatory Visit: Payer: Medicaid Other

## 2015-12-05 ENCOUNTER — Other Ambulatory Visit: Payer: Self-pay | Admitting: Family Medicine

## 2015-12-05 DIAGNOSIS — N926 Irregular menstruation, unspecified: Secondary | ICD-10-CM | POA: Insufficient documentation

## 2015-12-05 DIAGNOSIS — R1084 Generalized abdominal pain: Secondary | ICD-10-CM | POA: Diagnosis present

## 2015-12-05 DIAGNOSIS — N921 Excessive and frequent menstruation with irregular cycle: Secondary | ICD-10-CM | POA: Diagnosis not present

## 2015-12-05 DIAGNOSIS — K8020 Calculus of gallbladder without cholecystitis without obstruction: Secondary | ICD-10-CM | POA: Insufficient documentation

## 2015-12-05 DIAGNOSIS — Z8742 Personal history of other diseases of the female genital tract: Secondary | ICD-10-CM | POA: Diagnosis not present

## 2015-12-05 DIAGNOSIS — N83291 Other ovarian cyst, right side: Secondary | ICD-10-CM | POA: Insufficient documentation

## 2015-12-05 HISTORY — DX: Calculus of gallbladder without cholecystitis without obstruction: K80.20

## 2015-12-05 NOTE — Assessment & Plan Note (Signed)
With gallstones on [place]; refer to surgeon

## 2015-12-05 NOTE — Assessment & Plan Note (Signed)
Refer to surgeon. 

## 2015-12-08 ENCOUNTER — Encounter: Payer: Self-pay | Admitting: Family Medicine

## 2015-12-08 ENCOUNTER — Ambulatory Visit (INDEPENDENT_AMBULATORY_CARE_PROVIDER_SITE_OTHER): Payer: Medicaid Other | Admitting: Family Medicine

## 2015-12-08 VITALS — BP 124/80 | HR 75 | Temp 99.1°F | Resp 14 | Wt 201.0 lb

## 2015-12-08 DIAGNOSIS — Z0000 Encounter for general adult medical examination without abnormal findings: Secondary | ICD-10-CM

## 2015-12-08 DIAGNOSIS — N83201 Unspecified ovarian cyst, right side: Secondary | ICD-10-CM

## 2015-12-08 NOTE — Patient Instructions (Addendum)
I'll refer you to a gynecologist for follow-up of the ovarian cyst and to see if anything else to do with the abnormal bleeding  We'll have you see the surgeon for the gallbladder issue  Check out [employer] and other websites for [employer] ideas and recipes Try meatless crumbles, Boca burgers, Sweet Earth seitan, [employer] vegan sausage mix (all plant protein), Sweet Earth [place] breakfast burritos, etc. You can further limit intake of saturated fats by switching to dairy free products (coconut milk coffee creamer, almond milk, Tofutti brand cream cheese and sour cream, Follow Your Heart vegenaise (mayonnaise alternative) and Follow Your Heart cheese alternative (the Antoine Primas is my favorite), [employer] and other products   Health Maintenance, Female Adopting a healthy lifestyle and getting preventive care can go a long way to promote health and wellness. Talk with your health care provider about what schedule of regular examinations is right for you. This is a good chance for you to check in with your provider about disease prevention and staying healthy. In between checkups, there are plenty of things you can do on your own. Experts have done a lot of research about which lifestyle changes and preventive measures are most likely to keep you healthy. Ask your health care provider for more information. WEIGHT AND DIET  Eat a healthy diet  Be sure to include plenty of vegetables, fruits, low-fat dairy products, and lean protein.  Do not eat a lot of foods high in solid fats, added sugars, or salt.  Get regular exercise. This is one of the most important things you can do for your health.  Most adults should exercise for at least 150 minutes each week. The exercise should increase your heart rate and make you sweat (moderate-intensity exercise).  Most adults should also do strengthening exercises at least twice a week. This is in addition to the moderate-intensity exercise.   Maintain a healthy weight  Body mass index (BMI) is a measurement that can be used to identify possible weight problems. It estimates body fat based on height and weight. Your health care provider can help determine your BMI and help you achieve or maintain a healthy weight.  For females 37 years of age and older:   A BMI below 18.5 is considered underweight.  A BMI of 18.5 to 24.9 is normal.  A BMI of 25 to 29.9 is considered overweight.  A BMI of 30 and above is considered obese.  Watch levels of cholesterol and blood lipids  You should start having your blood tested for lipids and cholesterol at 43 years of age, then have this test every 5 years.  You may need to have your cholesterol levels checked more often if:  Your lipid or cholesterol levels are high.  You are older than 43 years of age.  You are at high risk for heart disease.  CANCER SCREENING   Lung Cancer  Lung cancer screening is recommended for adults 67-64 years old who are at high risk for lung cancer because of a history of smoking.  A yearly low-dose CT scan of the lungs is recommended for people who:  Currently smoke.  Have quit within the past 15 years.  Have at least a 30-pack-year history of smoking. A pack year is smoking an average of one pack of cigarettes a day for 1 year.  Yearly screening should continue until it has been 15 years since you quit.  Yearly screening should stop if you develop  a health problem that would prevent you from having lung cancer treatment.  Breast Cancer  Practice breast self-awareness. This means understanding how your breasts normally appear and feel.  It also means doing regular breast self-exams. Let your health care provider know about any changes, no matter how small.  If you are in your 20s or 30s, you should have a clinical breast exam (CBE) by a health care provider every 1-3 years as part of a regular health exam.  If you are 2 or older, have a  CBE every year. Also consider having a breast X-ray (mammogram) every year.  If you have a family history of breast cancer, talk to your health care provider about genetic screening.  If you are at high risk for breast cancer, talk to your health care provider about having an MRI and a mammogram every year.  Breast cancer gene (BRCA) assessment is recommended for women who have family members with BRCA-related cancers. BRCA-related cancers include:  Breast.  Ovarian.  Tubal.  Peritoneal cancers.  Results of the assessment will determine the need for genetic counseling and BRCA1 and BRCA2 testing. Cervical Cancer Your health care provider may recommend that you be screened regularly for cancer of the pelvic organs (ovaries, uterus, and vagina). This screening involves a pelvic examination, including checking for microscopic changes to the surface of your cervix (Pap test). You may be encouraged to have this screening done every 3 years, beginning at age 61.  For women ages 48-65, health care providers may recommend pelvic exams and Pap testing every 3 years, or they may recommend the Pap and pelvic exam, combined with testing for human papilloma virus (HPV), every 5 years. Some types of HPV increase your risk of cervical cancer. Testing for HPV may also be done on women of any age with unclear Pap test results.  Other health care providers may not recommend any screening for nonpregnant women who are considered low risk for pelvic cancer and who do not have symptoms. Ask your health care provider if a screening pelvic exam is right for you.  If you have had past treatment for cervical cancer or a condition that could lead to cancer, you need Pap tests and screening for cancer for at least 20 years after your treatment. If Pap tests have been discontinued, your risk factors (such as having a new sexual partner) need to be reassessed to determine if screening should resume. Some women have  medical problems that increase the chance of getting cervical cancer. In these cases, your health care provider may recommend more frequent screening and Pap tests. Colorectal Cancer  This type of cancer can be detected and often prevented.  Routine colorectal cancer screening usually begins at 43 years of age and continues through 43 years of age.  Your health care provider may recommend screening at an earlier age if you have risk factors for colon cancer.  Your health care provider may also recommend using home test kits to check for hidden blood in the stool.  A small camera at the end of a tube can be used to examine your colon directly (sigmoidoscopy or colonoscopy). This is done to check for the earliest forms of colorectal cancer.  Routine screening usually begins at age 29.  Direct examination of the colon should be repeated every 5-10 years through 43 years of age. However, you may need to be screened more often if early forms of precancerous polyps or small growths are found. Skin Cancer  Check your skin from head to toe regularly.  Tell your health care provider about any new moles or changes in moles, especially if there is a change in a mole's shape or color.  Also tell your health care provider if you have a mole that is larger than the size of a pencil eraser.  Always use sunscreen. Apply sunscreen liberally and repeatedly throughout the day.  Protect yourself by wearing long sleeves, pants, a wide-brimmed hat, and sunglasses whenever you are outside. HEART DISEASE, DIABETES, AND HIGH BLOOD PRESSURE   High blood pressure causes heart disease and increases the risk of stroke. High blood pressure is more likely to develop in:  People who have blood pressure in the high end of the normal range (130-139/85-89 mm Hg).  People who are overweight or obese.  People who are African American.  If you are 69-22 years of age, have your blood pressure checked every 3-5 years.  If you are 69 years of age or older, have your blood pressure checked every year. You should have your blood pressure measured twice--once when you are at a hospital or clinic, and once when you are not at a hospital or clinic. Record the average of the two measurements. To check your blood pressure when you are not at a hospital or clinic, you can use:  An automated blood pressure machine at a pharmacy.  A home blood pressure monitor.  If you are between 45 years and 38 years old, ask your health care provider if you should take aspirin to prevent strokes.  Have regular diabetes screenings. This involves taking a blood sample to check your fasting blood sugar level.  If you are at a normal weight and have a low risk for diabetes, have this test once every three years after 43 years of age.  If you are overweight and have a high risk for diabetes, consider being tested at a younger age or more often. PREVENTING INFECTION  Hepatitis B  If you have a higher risk for hepatitis B, you should be screened for this virus. You are considered at high risk for hepatitis B if:  You were born in a country where hepatitis B is common. Ask your health care provider which countries are considered high risk.  Your parents were born in a high-risk country, and you have not been immunized against hepatitis B (hepatitis B vaccine).  You have HIV or AIDS.  You use needles to inject street drugs.  You live with someone who has hepatitis B.  You have had sex with someone who has hepatitis B.  You get hemodialysis treatment.  You take certain medicines for conditions, including cancer, organ transplantation, and autoimmune conditions. Hepatitis C  Blood testing is recommended for:  Everyone born from [birth year] through 1963.  Anyone with known risk factors for hepatitis C. Sexually transmitted infections (STIs)  You should be screened for sexually transmitted infections (STIs) including gonorrhea and  chlamydia if:  You are sexually active and are younger than 43 years of age.  You are older than 43 years of age and your health care provider tells you that you are at risk for this type of infection.  Your sexual activity has changed since you were last screened and you are at an increased risk for chlamydia or gonorrhea. Ask your health care provider if you are at risk.  If you do not have HIV, but are at risk, it may be recommended that you take a prescription medicine  daily to prevent HIV infection. This is called pre-exposure prophylaxis (PrEP). You are considered at risk if:  You are sexually active and do not regularly use condoms or know the HIV status of your partner(s).  You take drugs by injection.  You are sexually active with a partner who has HIV. Talk with your health care provider about whether you are at high risk of being infected with HIV. If you choose to begin PrEP, you should first be tested for HIV. You should then be tested every 3 months for as long as you are taking PrEP.  PREGNANCY   If you are premenopausal and you may become pregnant, ask your health care provider about preconception counseling.  If you may become pregnant, take 400 to 800 micrograms (mcg) of folic acid every day.  If you want to prevent pregnancy, talk to your health care provider about birth control (contraception). OSTEOPOROSIS AND MENOPAUSE   Osteoporosis is a disease in which the bones lose minerals and strength with aging. This can result in serious bone fractures. Your risk for osteoporosis can be identified using a bone density scan.  If you are 71 years of age or older, or if you are at risk for osteoporosis and fractures, ask your health care provider if you should be screened.  Ask your health care provider whether you should take a calcium or vitamin D supplement to lower your risk for osteoporosis.  Menopause may have certain physical symptoms and risks.  Hormone replacement  therapy may reduce some of these symptoms and risks. Talk to your health care provider about whether hormone replacement therapy is right for you.  HOME CARE INSTRUCTIONS   Schedule regular health, dental, and eye exams.  Stay current with your immunizations.   Do not use any tobacco products including cigarettes, chewing tobacco, or electronic cigarettes.  If you are pregnant, do not drink alcohol.  If you are breastfeeding, limit how much and how often you drink alcohol.  Limit alcohol intake to no more than 1 drink per day for nonpregnant women. One drink equals 12 ounces of beer, 5 ounces of wine, or 1 ounces of hard liquor.  Do not use street drugs.  Do not share needles.  Ask your health care provider for help if you need support or information about quitting drugs.  Tell your health care provider if you often feel depressed.  Tell your health care provider if you have ever been abused or do not feel safe at home.   This information is not intended to replace advice given to you by your health care provider. Make sure you discuss any questions you have with your health care provider.   Document Released: 11/05/2010 Document Revised: 05/13/2014 Document Reviewed: 03/24/2013 Elsevier Interactive Patient Education [employer].

## 2015-12-08 NOTE — Assessment & Plan Note (Signed)
USPSTF grade A and B recommendations reviewed with patient; age-appropriate recommendations, preventive care, screening tests, etc discussed and encouraged; healthy living encouraged; see AVS for patient education given to patient  

## 2015-12-08 NOTE — Progress Notes (Signed)
BP 124/80   Pulse 75   Temp 99.1 F (37.3 C) (Oral)   Resp 14   Wt 201 lb (91.2 kg)   LMP 08/25/2013   SpO2 97%   BMI 30.56 kg/m    Subjective:    Patient ID: Andrea Pope, female    DOB: 06/28/1970, 45 y.o.   MRN: [id number]  HPI: Andrea Pope is a 45 y.o. female  Chief Complaint  Patient presents with  . Annual Exam    USPSTF grade A and B recommendations Alcohol: no Depression: Depression screen [facility] 2/9 11/29/2015 07/19/2015 05/12/2015 01/10/2015  Decreased Interest 0 0 0 0  Down, Depressed, Hopeless 1 0 0 0  PHQ - 2 Score 1 0 0 0  The lexapro made her nauseated; tried it with food and without; felt like morning sickness, definitely the medicine  Hypertension: okay Obesity: she has never been this heavy; she took some OTC water pills; retains fluid in feet and hands; has able ben able to lose weight; having to work really hard to just lose a few pounds; thyroid tests all normal Tobacco use: no HIV, hep B, hep C: declined STD testing and prevention (chl/gon/syphilis): declined Lipids: due soon, not fasting Glucose: recent checked Colorectal cancer: no fam hx; start at age 44 for screening Breast cancer: no fam hx BRCA gene screening: no Intimate partner violence:no Cervical cancer screening: due but patient asks to defer to gyn Lung cancer: n/a Osteoporosis: n/a Fall prevention/vitamin D: in the sun a fair amount AAA: n/a Aspirin: n/a Diet: pretty good eater, going plant-based Exercise: getting regular exercise Skin cancer: no worrisome moles  Depression screen [facility] 2/9 11/29/2015 07/19/2015 05/12/2015 01/10/2015  Decreased Interest 0 0 0 0  Down, Depressed, Hopeless 1 0 0 0  PHQ - 2 Score 1 0 0 0   Relevant past medical, surgical, family and social history reviewed Past Medical History:  Diagnosis Date  . Abnormal thyroid stimulating hormone level   . Allergy   . Anemia   . Encounter for initial prescription of injectable contraceptive   . Female  stress incontinence   . High cholesterol   . History of ectopic pregnancy 11/29/2015  . Menorrhagia with irregular cycle   . Ovarian cyst 11/29/2015  . Spider veins of both lower extremities    No past surgical history on file. Family History  Problem Relation Age of Onset  . Hypertension Mother   . Thyroid disease Mother   . Hypertension Father   . Cancer Father     Prostate  . Diabetes Brother   . Diabetes Other   . Cancer Paternal Uncle     Prostate  . Diabetes Maternal Grandmother   . Cancer Paternal Grandfather     Prostate  . Breast cancer Neg Hx    Social History  Substance Use Topics  . Smoking status: Never Smoker  . Smokeless tobacco: Never Used  . Alcohol use No   Interim medical history since last visit reviewed. Allergies and medications reviewed  Review of Systems Per HPI unless specifically indicated above     Objective:    BP 124/80   Pulse 75   Temp 99.1 F (37.3 C) (Oral)   Resp 14   Wt 201 lb (91.2 kg)   LMP 08/25/2013   SpO2 97%   BMI 30.56 kg/m   Wt Readings from Last 3 Encounters:  12/08/15 201 lb (91.2 kg)  11/29/15 202 lb (91.6 kg)  08/14/15 205 lb (93  kg)    Physical Exam  Constitutional: She appears well-developed and well-nourished.  HENT:  Head: Normocephalic and atraumatic.  Right Ear: Hearing, tympanic membrane, external ear and ear canal normal.  Left Ear: Hearing, tympanic membrane, external ear and ear canal normal.  Eyes: Conjunctivae and EOM are normal. Right eye exhibits no hordeolum. Left eye exhibits no hordeolum. No scleral icterus.  Neck: Carotid bruit is not present. No thyromegaly present.  Cardiovascular: Normal rate, regular rhythm, S1 normal, S2 normal and normal heart sounds.   No extrasystoles are present.  Pulmonary/Chest: Effort normal and breath sounds normal. No respiratory distress.  Abdominal: Soft. Normal appearance and bowel sounds are normal. She exhibits no distension, no abdominal bruit, no  pulsatile midline mass and no mass. There is no hepatosplenomegaly. There is no tenderness. No hernia.  Musculoskeletal: Normal range of motion. She exhibits no edema.  Lymphadenopathy:       Head (right side): No submandibular adenopathy present.       Head (left side): No submandibular adenopathy present.    She has no cervical adenopathy.  Neurological: She is alert. She displays no tremor. No cranial nerve deficit. She exhibits normal muscle tone. Gait normal.  Reflex Scores:      Patellar reflexes are 2+ on the right side and 2+ on the left side. Skin: Skin is warm and dry. No bruising and no ecchymosis noted. No cyanosis. No pallor.  Psychiatric: Her speech is normal and behavior is normal. Thought content normal. Her mood appears not anxious. She does not exhibit a depressed mood.   Results for orders placed or performed in visit on 11/29/15  Comprehensive Metabolic Panel (CMET)  Result Value Ref Range   Sodium 138 135 - 146 mmol/L   Potassium 4.2 3.5 - 5.3 mmol/L   Chloride 104 98 - 110 mmol/L   CO2 23 20 - 31 mmol/L   Glucose, Bld 84 65 - 99 mg/dL   BUN 10 7 - 25 mg/dL   Creat 0.87 0.50 - 1.10 mg/dL   Total Bilirubin 0.5 0.2 - 1.2 mg/dL   Alkaline Phosphatase 49 33 - 115 U/L   AST 15 10 - 30 U/L   ALT 17 6 - 29 U/L   Total Protein 6.9 6.1 - 8.1 g/dL   Albumin 4.0 3.6 - 5.1 g/dL   Calcium 9.1 8.6 - 10.2 mg/dL  CBC with Differential/Platelet  Result Value Ref Range   WBC 3.9 3.8 - 10.8 K/uL   RBC 4.52 3.80 - 5.10 MIL/uL   Hemoglobin 13.7 11.7 - 15.5 g/dL   HCT 40.8 35.0 - 45.0 %   MCV 90.3 80.0 - 100.0 fL   MCH 30.3 27.0 - 33.0 pg   MCHC 33.6 32.0 - 36.0 g/dL   RDW 13.5 11.0 - 15.0 %   Platelets 322 140 - 400 K/uL   MPV 10.5 7.5 - 12.5 fL   Neutro Abs 1,365 (L) 1,500 - 7,800 cells/uL   Lymphs Abs 1,872 850 - 3,900 cells/uL   Monocytes Absolute 351 200 - 950 cells/uL   Eosinophils Absolute 312 15 - 500 cells/uL   Basophils Absolute 0 0 - 200 cells/uL   Neutrophils  Relative % 35 %   Lymphocytes Relative 48 %   Monocytes Relative 9 %   Eosinophils Relative 8 %   Basophils Relative 0 %   Smear Review Criteria for review not met   TSH  Result Value Ref Range   TSH 0.56 mIU/L  T4, free  Result Value Ref Range   Free T4 1.0 0.8 - 1.8 ng/dL  Luteinizing hormone  Result Value Ref Range   LH 7.1 mIU/mL  Follicle stimulating hormone  Result Value Ref Range   FSH 6.1 mIU/mL  Urinalysis w microscopic + reflex cultur  Result Value Ref Range   Color, Urine YELLOW YELLOW   APPearance CLEAR CLEAR   Specific Gravity, Urine 1.019 1.001 - 1.035   pH 7.5 5.0 - 8.0   Glucose, UA NEGATIVE NEGATIVE   Bilirubin Urine NEGATIVE NEGATIVE   Ketones, ur NEGATIVE NEGATIVE   Hgb urine dipstick NEGATIVE NEGATIVE   Protein, ur NEGATIVE NEGATIVE   Nitrite NEGATIVE NEGATIVE   Leukocytes, UA NEGATIVE NEGATIVE   WBC, UA NONE SEEN <=5 WBC/HPF   RBC / HPF NONE SEEN <=2 RBC/HPF   Squamous Epithelial / LPF 0-5 <=5 HPF   Bacteria, UA NONE SEEN NONE SEEN HPF   Crystals NONE SEEN NONE SEEN HPF   Casts NONE SEEN NONE SEEN LPF   Yeast NONE SEEN NONE SEEN HPF  POCT urine pregnancy  Result Value Ref Range   Preg Test, Ur Negative Negative      Assessment & Plan:   Problem List Items Addressed This Visit      Genitourinary   Ovarian cyst    Refer to GYN      Relevant Orders   Ambulatory referral to Gynecology     Other   Preventative health care    USPSTF grade A and B recommendations reviewed with patient; age-appropriate recommendations, preventive care, screening tests, etc discussed and encouraged; healthy living encouraged; see AVS for patient education given to patient      Relevant Orders   Lipid panel    Other Visit Diagnoses   None.     Follow up plan: Return in about 1 year (around 12/07/2016) for complete physical.  An after-visit summary was printed and given to the patient at [facility].  Please see the patient instructions which may contain  other information and recommendations beyond what is mentioned above in the assessment and plan.  No orders of the defined types were placed in this encounter.   Orders Placed This Encounter  Procedures  . Lipid panel  . Ambulatory referral to Gynecology

## 2015-12-08 NOTE — Assessment & Plan Note (Signed)
Refer to GYN. 

## 2015-12-12 LAB — LIPID PANEL
CHOLESTEROL: 172 mg/dL (ref 125–200)
HDL: 56 mg/dL (ref >=46)
LDL CALC: 94 mg/dL (ref <130)
TRIGLYCERIDES: 108 mg/dL (ref <150)
Total CHOL/HDL Ratio: 3.1 Ratio (ref <=5.0)
VLDL: 22 mg/dL (ref <30)

## 2015-12-13 ENCOUNTER — Ambulatory Visit (INDEPENDENT_AMBULATORY_CARE_PROVIDER_SITE_OTHER): Payer: Medicaid Other | Admitting: General Surgery

## 2015-12-13 ENCOUNTER — Encounter: Payer: Self-pay | Admitting: General Surgery

## 2015-12-13 VITALS — BP 124/78 | HR 76 | Resp 12 | Ht 68.0 in | Wt 201.0 lb

## 2015-12-13 DIAGNOSIS — K8020 Calculus of gallbladder without cholecystitis without obstruction: Secondary | ICD-10-CM

## 2015-12-13 NOTE — Progress Notes (Signed)
Patient ID: Andrea Pope, female   DOB: Jun 19, 1970, 45 y.o.   MRN: [id number]  Chief Complaint  Patient presents with  . Abdominal Pain    HPI Andrea Pope is a 45 y.o. female is here today for an evaluation of upper-abdominal pain that doubles her over. She has only experienced this pain a couple times, each episode lasting 20-30 minutes up to 4 hours. She states it has been a couple of weeks since she had an episode of pain. Denies nausea or vomiting. Ultrasound was 12-05-15. I have reviewed the history of present illness with the patient.  HPI  Past Medical History:  Diagnosis Date  . Abnormal thyroid stimulating hormone level   . Allergy   . Anemia   . Encounter for initial prescription of injectable contraceptive   . Female stress incontinence   . High cholesterol   . History of ectopic pregnancy 11/29/2015  . Menorrhagia with irregular cycle   . Ovarian cyst 11/29/2015  . Spider veins of both lower extremities     No past surgical history on file.  Family History  Problem Relation Age of Onset  . Hypertension Mother   . Thyroid disease Mother   . Hypertension Father   . Cancer Father     Prostate  . Diabetes Brother   . Diabetes Other   . Cancer Paternal Uncle     Prostate  . Diabetes Maternal Grandmother   . Cancer Paternal Grandfather     Prostate  . Breast cancer Neg Hx     Social History Social History  Substance Use Topics  . Smoking status: Never Smoker  . Smokeless tobacco: Never Used  . Alcohol use No    No Known Allergies  Current Outpatient Prescriptions  Medication Sig Dispense Refill  . fluticasone (FLONASE) 50 MCG/ACT nasal spray Place 2 sprays into the nose daily.    Marland Kitchen. ibuprofen (ADVIL,MOTRIN) 800 MG tablet Take 1 tablet (800 mg total) by mouth every 8 (eight) hours as needed. 50 tablet 1   No current facility-administered medications for this visit.     Review of Systems Review of Systems  Constitutional: Negative.    Cardiovascular: Negative.   Gastrointestinal: Positive for abdominal pain. Negative for constipation, diarrhea, nausea and vomiting.    Blood pressure 124/78, pulse 76, resp. rate 12, height 5\' 8"  (1.727 m), weight 201 lb (91.2 kg), last menstrual period 08/25/2013.  Physical Exam Physical Exam  Constitutional: She is oriented to person, place, and time. She appears well-developed and well-nourished.  HENT:  Mouth/Throat: Oropharynx is clear and moist.  Eyes: Conjunctivae are normal. No scleral icterus.  Neck: Neck supple.  Cardiovascular: Normal rate, regular rhythm and normal heart sounds.   Pulmonary/Chest: Effort normal and breath sounds normal.  Abdominal: Soft. Bowel sounds are normal. There is no tenderness. There is no rebound and no guarding.  Lymphadenopathy:    She has no cervical adenopathy.  Neurological: She is alert and oriented to person, place, and time.  Skin: Skin is warm and dry.  Psychiatric: Her behavior is normal.    Data Reviewed Prior notes Gallbladder ultrasound results - one large, 7mm, gallstone with innumerable very small stones. No gbw thickening,or pericholecystic fluid LFTs were normal Assessment    Biliary colic    Plan     Laparoscopic Cholecystectomy with Intraoperative Cholangiogram. The procedure, including it's potential risks and complications (including but not limited to infection, bleeding, injury to intra-abdominal organs or bile ducts, bile leak, poor cosmetic  result, sepsis and death) were discussed with the patient in detail. Non-operative options, including their inherent risks (acute calculous cholecystitis with possible choledocholithiasis or gallstone pancreatitis, with the risk of ascending cholangitis, sepsis, and death) were discussed as well. The patient expressed and understanding of what we discussed and wishes to proceed with laparoscopic cholecystectomy. The patient further understands that if it is technically not  possible, or it is unsafe to proceed laparoscopically, that I will convert to an open cholecystectomy.     The patient is scheduled for surgery at [facility] on 01/17/16. She will pre admit by phone. The patient is aware of date and instructions.   This information has been scribed by Dorathy Daft RN, BSN,BC.  Suraiya Dickerson G 12/14/2015, 9:32 AM

## 2015-12-13 NOTE — Patient Instructions (Addendum)
Laparoscopic Cholecystectomy Laparoscopic cholecystectomy is surgery to remove the gallbladder. The gallbladder is located in the upper right part of the abdomen, behind the liver. It is a storage sac for bile, which is produced in the liver. Bile aids in the digestion and absorption of fats. Cholecystectomy is often done for inflammation of the gallbladder (cholecystitis). This condition is usually caused by a buildup of gallstones (cholelithiasis) in the gallbladder. Gallstones can block the flow of bile, and that can result in inflammation and pain. In severe cases, emergency surgery may be required. If emergency surgery is not required, you will have time to prepare for the procedure. Laparoscopic surgery is an alternative to open surgery. Laparoscopic surgery has a shorter recovery time. Your common bile duct may also need to be examined during the procedure. If stones are found in the common bile duct, they may be removed. LET YOUR HEALTH CARE PROVIDER KNOW ABOUT:  Any allergies you have.  All medicines you are taking, including vitamins, herbs, eye drops, creams, and over-the-counter medicines.  Previous problems you or members of your family have had with the use of anesthetics.  Any blood disorders you have.  Previous surgeries you have had.  Any medical conditions you have. RISKS AND COMPLICATIONS Generally, this is a safe procedure. However, problems may occur, including:  Infection.  Bleeding.  Allergic reactions to medicines.  Damage to other structures or organs.  A stone remaining in the common bile duct.  A bile leak from the cyst duct that is clipped when your gallbladder is removed.  The need to convert to open surgery, which requires a larger incision in the abdomen. This may be necessary if your surgeon thinks that it is not safe to continue with a laparoscopic procedure. BEFORE THE PROCEDURE  Ask your health care provider about:  Changing or stopping your  regular medicines. This is especially important if you are taking diabetes medicines or blood thinners.  Taking medicines such as aspirin and ibuprofen. These medicines can thin your blood. Do not take these medicines before your procedure if your health care provider instructs you not to.  Follow instructions from your health care provider about eating or drinking restrictions.  Let your health care provider know if you develop a cold or an infection before surgery.  Plan to have someone take you home after the procedure.  Ask your health care provider how your surgical site will be marked or identified.  You may be given antibiotic medicine to help prevent infection. PROCEDURE  To reduce your risk of infection:  Your health care team will wash or sanitize their hands.  Your skin will be washed with soap.  An IV tube may be inserted into one of your veins.  You will be given a medicine to make you fall asleep (general anesthetic).  A breathing tube will be placed in your mouth.  The surgeon will make several small cuts (incisions) in your abdomen.  A thin, lighted tube (laparoscope) that has a tiny camera on the end will be inserted through one of the small incisions. The camera on the laparoscope will send a picture to a TV screen (monitor) in the operating room. This will give the surgeon a good view inside your abdomen.  A gas will be pumped into your abdomen. This will expand your abdomen to give the surgeon more room to perform the surgery.  Other tools that are needed for the procedure will be inserted through the other incisions. The gallbladder will   be removed through one of the incisions.  After your gallbladder has been removed, the incisions will be closed with stitches (sutures), staples, or skin glue.  Your incisions may be covered with a bandage (dressing). The procedure may vary among health care providers and hospitals. AFTER THE PROCEDURE  Your blood  pressure, heart rate, breathing rate, and blood oxygen level will be monitored often until the medicines you were given have worn off.  You will be given medicines as needed to control your pain.   This information is not intended to replace advice given to you by your health care provider. Make sure you discuss any questions you have with your health care provider.   Document Released: 04/22/2005 Document Revised: 01/11/2015 Document Reviewed: 12/02/2012 Elsevier Interactive Patient Education [employer]2016 Elsevier Inc.  The patient is scheduled for surgery at [facility] on 01/17/16. She will pre admit by phone. The patient is aware of date and instructions.

## 2015-12-14 ENCOUNTER — Encounter: Payer: Self-pay | Admitting: General Surgery

## 2016-01-01 ENCOUNTER — Encounter: Payer: Self-pay | Admitting: Family Medicine

## 2016-01-01 DIAGNOSIS — E7800 Pure hypercholesterolemia, unspecified: Secondary | ICD-10-CM | POA: Insufficient documentation

## 2016-01-03 ENCOUNTER — Encounter: Payer: Self-pay | Admitting: Obstetrics and Gynecology

## 2016-01-03 ENCOUNTER — Ambulatory Visit (INDEPENDENT_AMBULATORY_CARE_PROVIDER_SITE_OTHER): Payer: Medicaid Other | Admitting: Obstetrics and Gynecology

## 2016-01-03 ENCOUNTER — Telehealth: Payer: Self-pay | Admitting: General Surgery

## 2016-01-03 VITALS — BP 137/74 | HR 78 | Ht 68.0 in | Wt 200.5 lb

## 2016-01-03 DIAGNOSIS — N941 Unspecified dyspareunia: Secondary | ICD-10-CM | POA: Diagnosis not present

## 2016-01-03 DIAGNOSIS — N946 Dysmenorrhea, unspecified: Secondary | ICD-10-CM | POA: Diagnosis not present

## 2016-01-03 DIAGNOSIS — R635 Abnormal weight gain: Secondary | ICD-10-CM | POA: Diagnosis not present

## 2016-01-03 DIAGNOSIS — N920 Excessive and frequent menstruation with regular cycle: Secondary | ICD-10-CM

## 2016-01-03 NOTE — Telephone Encounter (Signed)
Patient would like a Friday for her surgery currently scheduled for 01/17/16. The patient is now scheduled for surgery at [facility] on 01/19/16. She is aware to call the day before to get her arrival time. The patient is aware of date and instructions.

## 2016-01-03 NOTE — Addendum Note (Signed)
Addended by: Marchelle FolksMILLER, Quency Tober G on: 01/03/2016 12:15 PM   Modules accepted: Orders

## 2016-01-03 NOTE — Patient Instructions (Signed)
1. Endometrial biopsy is obtained today 2. Follow-up in 01/31/2016 for further management planning (after gallbladder surgery) 3. If endometrial biopsy is benign, we will start birth control pill for cycle regulation and dysmenorrhea control   Endometrial Biopsy, Care After Refer to this sheet in the next few weeks. These instructions provide you with information on caring for yourself after your procedure. Your health care provider may also give you more specific instructions. Your treatment has been planned according to current medical practices, but problems sometimes occur. Call your health care provider if you have any problems or questions after your procedure. WHAT TO EXPECT AFTER THE PROCEDURE After your procedure, it is typical to have the following:  You may have mild cramping and a small amount of vaginal bleeding for a few days after the procedure. This is normal. HOME CARE INSTRUCTIONS  Only take over-the-counter or prescription medicine as directed by your health care provider.  Do not douche, use tampons, or have sexual intercourse until your health care provider approves.  Follow your health care provider's instructions regarding any activity restrictions, such as strenuous exercise or heavy lifting. SEEK MEDICAL CARE IF:  You have heavy bleeding or bleeding longer than 2 days after the procedure.  You have bad smelling drainage from your vagina.  You have a fever and chills.  Youhave severe lower stomach (abdominal) pain. SEEK IMMEDIATE MEDICAL CARE IF:  You have severe cramps in your stomach or back.  You pass large blood clots.  Your bleeding increases.  You become weak or lightheaded, or you pass out.   This information is not intended to replace advice given to you by your health care provider. Make sure you discuss any questions you have with your health care provider.    Endometriosis Endometriosis is a condition in which the tissue that lines the  uterus (endometrium) grows outside of its normal location. The tissue may grow in many locations close to the uterus, but it commonly grows on the ovaries, fallopian tubes, vagina, or bowel. Because the uterus expels, or sheds, its lining every menstrual cycle, there is bleeding wherever the endometrial tissue is located. This can cause pain because blood is irritating to tissues not normally exposed to it.  CAUSES  The cause of endometriosis is not known.  SIGNS AND SYMPTOMS  Often, there are no symptoms. When symptoms are present, they can vary with the location of the displaced tissue. Various symptoms can occur at different times. Although symptoms occur mainly during a woman's menstrual period, they can also occur midcycle and usually stop with menopause. Some people may go months with no symptoms at all. Symptoms may include:   Back or abdominal pain.   Heavier bleeding during periods.   Pain during intercourse.   Painful bowel movements.   Infertility. DIAGNOSIS  Your health care provider will do a physical exam and ask about your symptoms. Various tests may be done, such as:   Blood tests and urine tests. These are done to help rule out other problems.   Ultrasound. This test is done to look for abnormal tissue.   An X-ray of the lower bowel (barium enema).  Laparoscopy. In this procedure, a thin, lighted tube with a tiny camera on the end (laparoscope) is inserted into your abdomen. This helps your health care provider look for abnormal tissue to confirm the diagnosis. The health care provider may also remove a small piece of tissue (biopsy) from any abnormal tissue found. This tissue sample can then  be sent to a lab so it can be looked at under a microscope. TREATMENT  Treatment will vary and may include:   Medicines to relieve pain. Nonsteroidal anti-inflammatory drugs (NSAIDs) are a type of pain medicine that can help to relieve the pain caused by  endometriosis.  Hormonal therapy. When using hormonal therapy, periods are eliminated. This eliminates the monthly exposure to blood by the displaced endometrial tissue.   Surgery. Surgery may sometimes be done to remove the abnormal endometrial tissue. In severe cases, surgery may be done to remove the fallopian tubes, uterus, and ovaries (hysterectomy). HOME CARE INSTRUCTIONS   Take all medicines as directed by your health care provider. Do not take aspirin because it may increase bleeding when you are not on hormonal therapy.   Avoid activities that produce pain, including sexual activity. SEEK MEDICAL CARE IF:  You have pelvic pain before, after, or during your periods.  You have pelvic pain between periods that gets worse during your period.  You have pelvic pain during or after sex.  You have pelvic pain with bowel movements or urination, especially during your period.  You have problems getting pregnant.  You have a fever. SEEK IMMEDIATE MEDICAL CARE IF:   Your pain is severe and is not responding to pain medicine.   You have severe nausea and vomiting, or you cannot keep foods down.   You have pain that is limited to the right lower part of your abdomen.   You have swelling or increasing pain in your abdomen.   You see blood in your stool.  MAKE SURE YOU:   Understand these instructions.  Will watch your condition.  Will get help right away if you are not doing well or get worse.   This information is not intended to replace advice given to you by your health care provider. Make sure you discuss any questions you have with your health care provider.   Document Released: 04/19/2000 Document Revised: 05/13/2014 Document Reviewed: 12/18/2012 Elsevier Interactive Patient Education 2016 Elsevier Inc.    Document Released: 02/10/2013 Document Reviewed: 02/10/2013 Elsevier Interactive Patient Education [employer] Elsevier Inc.

## 2016-01-03 NOTE — Progress Notes (Signed)
GYN ENCOUNTER NOTE  Subjective:       Andrea Pope is a 45 y.o. G23P4004 female is here for gynecologic evaluation of the following issues:  1. Abnormal uterine bleeding  Abnormal uterine bleeding began approximately 3 months ago. Bleeding lasts approximately 10-15 days per month and is characterized as a continuum of light spotting, followed by heavier spotting, followed by slow, followed by light spotting.    The patient had a Depo-Provera injection approximately 1 year ago and was amenorrheic until the past 3 months.  The patient has gained 32 pounds over the past 8 months; she reports having no change in dietary habits and reports exercising 90 minutes 3 times a week.   Menarche: Age 75 History of irregular cycles during teenage years History of normal cycles thereafter until most recent abnormalities. Intervals: Monthly Duration of flow: 7 days Dysmenorrhea history: Moderate to severe with need for Tylenol and Motrin for pain relief; patient did miss school and work in the past because of severe dysmenorrhea Pain character: Midline Cramping along with low back pain with radiation into the buttocks and thighs Dyspareunia: Intermittent history of deep thrusting pain, worse perimenstrually Did not use birth control pills in the past because nausea intolerance Did use Depo-Provera in the past with success as it caused amenorrhea and suppression of pelvic pain  Patient does not report any hirsutism hair growth or other associated changes with hyperandrogenism  Review of workup to date includes normal FSH, LH, TSH, CBC, CMP, pelvic ultrasound  Gynecologic History Patient's last menstrual period was 12/16/2015 (exact date). Contraception: none  Obstetric History OB History  Gravida Para Term Preterm AB Living  4 4 4     4   SAB TAB Ectopic Multiple Live Births          4    # Outcome Date GA Lbr Len/2nd Weight Sex Delivery Anes PTL Lv  4 Term 2009   9 lb (4.082 kg) M  Vag-Spont   LIV  3 Term 2006   9 lb (4.082 kg) F Vag-Spont   LIV  2 Term 2002   9 lb (4.082 kg) M Vag-Spont   LIV  1 Term 2001   6 lb 12.8 oz (3.084 kg) M Vag-Spont   LIV      Past Medical History:  Diagnosis Date  . Abnormal thyroid stimulating hormone level   . Allergy   . Anemia   . Encounter for initial prescription of injectable contraceptive   . Female stress incontinence   . High cholesterol   . History of ectopic pregnancy 11/29/2015  . Menorrhagia with irregular cycle   . Ovarian cyst 11/29/2015  . Spider veins of both lower extremities     No past surgical history on file.  Current Outpatient Prescriptions on File Prior to Visit  Medication Sig Dispense Refill  . fluticasone (FLONASE) 50 MCG/ACT nasal spray Place 2 sprays into the nose daily.    Marland Kitchen ibuprofen (ADVIL,MOTRIN) 800 MG tablet Take 1 tablet (800 mg total) by mouth every 8 (eight) hours as needed. 50 tablet 1   No current facility-administered medications on file prior to visit.     No Known Allergies  Social History   Social History  . Marital status: Married    Spouse name: N/A  . Number of children: N/A  . Years of education: N/A   Occupational History  . Not on file.   Social History Main Topics  . Smoking status: Never Smoker  . Smokeless  tobacco: Never Used  . Alcohol use No  . Drug use: No  . Sexual activity: Yes    Partners: Male    Birth control/ protection: None   Other Topics Concern  . Not on file   Social History Narrative  . No narrative on file    Family History  Problem Relation Age of Onset  . Hypertension Mother   . Thyroid disease Mother   . Hypertension Father   . Cancer Father     Prostate  . Diabetes Brother   . Diabetes Other   . Cancer Paternal Uncle     Prostate  . Diabetes Maternal Grandmother   . Cancer Paternal Grandfather     Prostate  . Breast cancer Neg Hx   . Ovarian cancer Neg Hx   . Heart disease Neg Hx     The following portions of the  patient's history were reviewed and updated as appropriate: allergies, current medications, past family history, past medical history, past social history, past surgical history and problem list.  Review of Systems Review of Systems - Per history of present illness; no history of hypertension, migraine headaches, DVT, or smoking tobacco products  Objective:   BP 137/74   Pulse 78   Ht 5\' 8"  (1.727 m)   Wt 200 lb 8 oz (90.9 kg)   LMP 12/16/2015 (Exact Date)   BMI 30.49 kg/m  CONSTITUTIONAL: Well-developed, well-nourished female in no acute distress.  HENT:  Normocephalic, atraumatic. No androgenic hair growth pattern NECK: Normal range of motion, supple, no masses.  Normal thyroid.  SKIN: Skin is warm and dry. No rash noted. Not diaphoretic. No erythema. No pallor. NEUROLGIC: Alert and oriented to person, place, and time. PSYCHIATRIC: Normal mood and affect. Normal behavior. Normal judgment and thought content. CARDIOVASCULAR:Not Examined RESPIRATORY: Not Examined BREASTS: Not Examined ABDOMEN: Soft, non distended; Non tender.  No Organomegaly. PELVIC:  External Genitalia: Normal  BUS: Normal  Vagina: Normal  Cervix: Normal; parous; no cervical motion tenderness  Uterus: Normal size, shape,consistency, mobile; anteverted; 2/4 tender  Adnexa: Normal; nonpalpable nontender  RV: Normal external exam  Bladder: Nontender MUSCULOSKELETAL: Normal range of motion. No tenderness.  No cyanosis, clubbing, or edema.  PROCEDURE: Endometrial biopsy   Endometrial Biopsy Procedure Note  Pre-operative Diagnosis: Menorrhagia with regular cycles  Post-operative Diagnosis: Same as  Procedure Details   Urine pregnancy test was done -negative.  The risks (including infection, bleeding, pain, and uterine perforation) and benefits of the procedure were explained to the patient and Verbal informed consent was obtained.  Antibiotic prophylaxis against endocarditis was not indicated.   The patient  was placed in the dorsal lithotomy position.  Bimanual exam showed the uterus to be in the anteroflexed position.  A Graves' speculum inserted in the vagina.  Endocervical curettage with a Kevorkian curette was not performed.   A sharp tenaculum was applied to the anterior lip of the cervix for stabilization.  A sterile uterine sound was used to sound the uterus to a depth of 8cm.  A Mylex 3mm curette was used to sample the endometrium.  Sample was sent for pathologic examination.  Condition: Stable  Complications: None  Plan:  The patient was advised to call for any fever or for prolonged or severe pain or bleeding. She was advised to use OTC acetaminophen and OTC ibuprofen as needed for mild to moderate pain. She was advised to avoid vaginal intercourse for 48 hours or until the bleeding has completely stopped.  Attending Physician  Documentation: Herold Harms, MD    Assessment:   1. Menorrhagia with regular cycle; Normal laboratory workup and normal ultrasound; patient is an OCP candidate for cycle regulation and dysmenorrhea regulation  2. Dysmenorrhea; deep thrusting dyspareunia  3. Abnormal weight gain, possibly related to Depo-Provera  4. Suspected endometriosis based on history and physical  5. Doubt PCO     Plan:   1. Endometrial biopsy as noted 2. Return in September for follow-up and start of OCP therapy 3. Patient is to have interval cholecystectomy   Herold Harms, MD  Note: This dictation was prepared with Dragon dictation along with smaller phrase technology. Any transcriptional errors that result from this process are unintentional.

## 2016-01-03 NOTE — Telephone Encounter (Signed)
PT CALLED & WANTED TO SPEAK TO THE SURGERY SCHEDULER. SHE CURRENTLY IS SCHEDULED TO HAVE GB SX ON 01-17-16 WITH DR Evette CristalSANKAR. SHE WOULD LIKE TO KNOW IF THERE IS A Friday AVAILABLE.

## 2016-01-06 LAB — PATHOLOGY

## 2016-01-09 ENCOUNTER — Encounter
Admission: RE | Admit: 2016-01-09 | Discharge: 2016-01-09 | Disposition: A | Discharge status: Home / Self Care | Payer: Medicaid Other | Source: Ambulatory Visit | Attending: General Surgery | Admitting: General Surgery

## 2016-01-09 DIAGNOSIS — Z01818 Encounter for other preprocedural examination: Secondary | ICD-10-CM | POA: Diagnosis not present

## 2016-01-09 NOTE — Patient Instructions (Signed)
  Your procedure is scheduled on: 01/19/16 Fri Report to Same Day Surgery 2nd floor medical mall To find out your arrival time please call [phone] between 1PM - 3PM on 01/18/16 Thurs  Remember: Instructions that are not followed completely may result in serious medical risk, up to and including death, or upon the discretion of your surgeon and anesthesiologist your surgery may need to be rescheduled.    _x___ 1. Do not eat food or drink liquids after midnight. No gum chewing or hard candies.     __x__ 2. No Alcohol for 24 hours before or after surgery.   __x__3. No Smoking for 24 prior to surgery.   ____  4. Bring all medications with you on the day of surgery if instructed.    __x__ 5. Notify your doctor if there is any change in your medical condition     (cold, fever, infections).     Do not wear jewelry, make-up, hairpins, clips or nail polish.  Do not wear lotions, powders, or perfumes. You may wear deodorant.  Do not shave 48 hours prior to surgery. Men may shave face and neck.  Do not bring valuables to the hospital.    [facility] is not responsible for any belongings or valuables.               Contacts, dentures or bridgework may not be worn into surgery.  Leave your suitcase in the car. After surgery it may be brought to your room.  For patients admitted to the hospital, discharge time is determined by your treatment team.   Patients discharged the day of surgery will not be allowed to drive home.    Please read over the following fact sheets that you were given:   [facility] Pre[facility] and or MRSA Information   _x___ Take these medicines the morning of surgery with A SIP OF WATER:    1. None  2.  3.  4.  5.  6.  ____ Fleet Enema (as directed)   _x___ Use CHG Soap or sage wipes as directed on instruction sheet   ____ Use inhalers on the day of surgery and bring to hospital day of surgery  ____ Stop metformin 2 days prior to surgery    ____  Take 1/2 of usual insulin dose the night before surgery and none on the morning of           surgery.   ____ Stop aspirin or coumadin, or plavix  _x__ Stop Anti-inflammatories such as Advil, Aleve, Ibuprofen, Motrin, Naproxen,          Naprosyn, Goodies powders or aspirin products. Ok to take Tylenol.   ____ Stop supplements until after surgery.    ____ Bring C-Pap to the hospital.

## 2016-01-10 ENCOUNTER — Other Ambulatory Visit: Payer: Self-pay | Admitting: General Surgery

## 2016-01-10 DIAGNOSIS — K8020 Calculus of gallbladder without cholecystitis without obstruction: Secondary | ICD-10-CM

## 2016-01-17 ENCOUNTER — Encounter: Payer: Self-pay | Admitting: Obstetrics and Gynecology

## 2016-01-19 ENCOUNTER — Ambulatory Visit
Admission: RE | Admit: 2016-01-19 | Discharge: 2016-01-19 | Disposition: A | Discharge status: Home / Self Care | Payer: Medicaid Other | Source: Ambulatory Visit | Attending: General Surgery | Admitting: General Surgery

## 2016-01-19 ENCOUNTER — Ambulatory Visit: Payer: Medicaid Other | Admitting: Certified Registered Nurse Anesthetist

## 2016-01-19 ENCOUNTER — Ambulatory Visit: Payer: Medicaid Other

## 2016-01-19 ENCOUNTER — Encounter
Admission: RE | Disposition: A | Discharge status: Home / Self Care | Payer: Self-pay | Source: Ambulatory Visit | Attending: General Surgery

## 2016-01-19 DIAGNOSIS — D649 Anemia, unspecified: Secondary | ICD-10-CM | POA: Diagnosis not present

## 2016-01-19 DIAGNOSIS — Z79899 Other long term (current) drug therapy: Secondary | ICD-10-CM | POA: Diagnosis not present

## 2016-01-19 DIAGNOSIS — E669 Obesity, unspecified: Secondary | ICD-10-CM | POA: Diagnosis not present

## 2016-01-19 DIAGNOSIS — Z8042 Family history of malignant neoplasm of prostate: Secondary | ICD-10-CM | POA: Insufficient documentation

## 2016-01-19 DIAGNOSIS — Z833 Family history of diabetes mellitus: Secondary | ICD-10-CM | POA: Insufficient documentation

## 2016-01-19 DIAGNOSIS — Z6830 Body mass index (BMI) 30.0-30.9, adult: Secondary | ICD-10-CM | POA: Insufficient documentation

## 2016-01-19 DIAGNOSIS — Z8249 Family history of ischemic heart disease and other diseases of the circulatory system: Secondary | ICD-10-CM | POA: Insufficient documentation

## 2016-01-19 DIAGNOSIS — K8010 Calculus of gallbladder with chronic cholecystitis without obstruction: Secondary | ICD-10-CM | POA: Diagnosis not present

## 2016-01-19 DIAGNOSIS — E7800 Pure hypercholesterolemia, unspecified: Secondary | ICD-10-CM | POA: Diagnosis not present

## 2016-01-19 DIAGNOSIS — K8020 Calculus of gallbladder without cholecystitis without obstruction: Secondary | ICD-10-CM

## 2016-01-19 DIAGNOSIS — Z8349 Family history of other endocrine, nutritional and metabolic diseases: Secondary | ICD-10-CM | POA: Insufficient documentation

## 2016-01-19 DIAGNOSIS — Z419 Encounter for procedure for purposes other than remedying health state, unspecified: Secondary | ICD-10-CM

## 2016-01-19 HISTORY — PX: CHOLECYSTECTOMY: SHX55

## 2016-01-19 LAB — POCT PREGNANCY, URINE: Preg Test, Ur: NEGATIVE

## 2016-01-19 SURGERY — LAPAROSCOPIC CHOLECYSTECTOMY WITH INTRAOPERATIVE CHOLANGIOGRAM
Anesthesia: General

## 2016-01-19 MED ORDER — BUPIVACAINE-EPINEPHRINE (PF) 0.5% -1:200000 IJ SOLN
INTRAMUSCULAR | Status: AC
  Filled 2016-01-19: qty 30

## 2016-01-19 MED ORDER — LACTATED RINGERS IV SOLN
INTRAVENOUS | Status: DC
  Administered 2016-01-19 (×2): via INTRAVENOUS

## 2016-01-19 MED ORDER — HYDROMORPHONE HCL 1 MG/ML IJ SOLN
INTRAMUSCULAR | Status: DC | PRN
  Administered 2016-01-19: 1 mg via INTRAVENOUS

## 2016-01-19 MED ORDER — SODIUM CHLORIDE 0.9 % IV SOLN
INTRAVENOUS | Status: DC | PRN
  Administered 2016-01-19: 7 mL

## 2016-01-19 MED ORDER — OXYCODONE-ACETAMINOPHEN 5-325 MG PO TABS: 1.0000 | ORAL_TABLET | ORAL | 0 refills | Status: DC | PRN

## 2016-01-19 MED ORDER — PROPOFOL 10 MG/ML IV BOLUS
INTRAVENOUS | Status: DC | PRN
  Administered 2016-01-19: 100 mg via INTRAVENOUS

## 2016-01-19 MED ORDER — SODIUM CHLORIDE 0.9 % IJ SOLN
INTRAMUSCULAR | Status: AC
  Filled 2016-01-19: qty 10

## 2016-01-19 MED ORDER — LABETALOL HCL 5 MG/ML IV SOLN
INTRAVENOUS | Status: DC | PRN
  Administered 2016-01-19: 10 mg via INTRAVENOUS
  Administered 2016-01-19: 5 mg via INTRAVENOUS

## 2016-01-19 MED ORDER — ACETAMINOPHEN 10 MG/ML IV SOLN
INTRAVENOUS | Status: DC | PRN
  Administered 2016-01-19: 1000 mg via INTRAVENOUS

## 2016-01-19 MED ORDER — ONDANSETRON HCL 4 MG/2ML IJ SOLN
INTRAMUSCULAR | Status: DC | PRN
  Administered 2016-01-19: 4 mg via INTRAVENOUS

## 2016-01-19 MED ORDER — OXYCODONE HCL 5 MG PO TABS
5.0000 mg | ORAL_TABLET | Freq: Once | ORAL | Status: AC | PRN
  Administered 2016-01-19: 5 mg via ORAL

## 2016-01-19 MED ORDER — PROMETHAZINE HCL 25 MG/ML IJ SOLN
6.2500 mg | INTRAMUSCULAR | Status: DC | PRN
  Administered 2016-01-19: 6.25 mg via INTRAVENOUS

## 2016-01-19 MED ORDER — OXYCODONE HCL 5 MG PO TABS
ORAL_TABLET | ORAL | Status: AC
  Filled 2016-01-19: qty 1

## 2016-01-19 MED ORDER — CEFAZOLIN SODIUM-DEXTROSE 2-4 GM/100ML-% IV SOLN
2.0000 g | INTRAVENOUS | Status: AC
  Administered 2016-01-19: 2 g via INTRAVENOUS

## 2016-01-19 MED ORDER — LIDOCAINE HCL (CARDIAC) 20 MG/ML IV SOLN
INTRAVENOUS | Status: DC | PRN
  Administered 2016-01-19: 90 mg via INTRATRACHEAL

## 2016-01-19 MED ORDER — KETOROLAC TROMETHAMINE 30 MG/ML IJ SOLN
INTRAMUSCULAR | Status: DC | PRN
  Administered 2016-01-19: 15 mg via INTRAVENOUS

## 2016-01-19 MED ORDER — ONDANSETRON HCL 4 MG/2ML IJ SOLN
INTRAMUSCULAR | Status: AC
  Administered 2016-01-19: 4 mg
  Filled 2016-01-19: qty 2

## 2016-01-19 MED ORDER — MEPERIDINE HCL 25 MG/ML IJ SOLN: 6.2500 mg | INTRAMUSCULAR | Status: DC | PRN

## 2016-01-19 MED ORDER — PROMETHAZINE HCL 25 MG/ML IJ SOLN
INTRAMUSCULAR | Status: AC
  Administered 2016-01-19: 6.25 mg via INTRAVENOUS
  Filled 2016-01-19: qty 1

## 2016-01-19 MED ORDER — CHLORHEXIDINE GLUCONATE CLOTH 2 % EX PADS: 6.0000 | MEDICATED_PAD | Freq: Once | CUTANEOUS | Status: DC

## 2016-01-19 MED ORDER — ACETAMINOPHEN 10 MG/ML IV SOLN
INTRAVENOUS | Status: AC
  Filled 2016-01-19: qty 100

## 2016-01-19 MED ORDER — LIDOCAINE HCL (PF) 1 % IJ SOLN
INTRAMUSCULAR | Status: AC
  Filled 2016-01-19: qty 30

## 2016-01-19 MED ORDER — FAMOTIDINE 20 MG PO TABS
ORAL_TABLET | ORAL | Status: AC
  Administered 2016-01-19: 20 mg via ORAL
  Filled 2016-01-19: qty 1

## 2016-01-19 MED ORDER — ONDANSETRON HCL 4 MG/2ML IJ SOLN: 4.0000 mg | Freq: Once | INTRAMUSCULAR | Status: DC

## 2016-01-19 MED ORDER — OXYCODONE HCL 5 MG/5ML PO SOLN: 5.0000 mg | Freq: Once | ORAL | Status: AC | PRN

## 2016-01-19 MED ORDER — FENTANYL CITRATE (PF) 100 MCG/2ML IJ SOLN
25.0000 ug | INTRAMUSCULAR | Status: DC | PRN
  Administered 2016-01-19 (×4): 25 ug via INTRAVENOUS

## 2016-01-19 MED ORDER — SUGAMMADEX SODIUM 200 MG/2ML IV SOLN
INTRAVENOUS | Status: DC | PRN
  Administered 2016-01-19: 175 mg via INTRAVENOUS

## 2016-01-19 MED ORDER — FAMOTIDINE 20 MG PO TABS
20.0000 mg | ORAL_TABLET | Freq: Once | ORAL | Status: AC
  Administered 2016-01-19: 20 mg via ORAL

## 2016-01-19 MED ORDER — FENTANYL CITRATE (PF) 100 MCG/2ML IJ SOLN
INTRAMUSCULAR | Status: AC
  Filled 2016-01-19: qty 2

## 2016-01-19 MED ORDER — ROCURONIUM BROMIDE 100 MG/10ML IV SOLN
INTRAVENOUS | Status: DC | PRN
  Administered 2016-01-19: 10 mg via INTRAVENOUS
  Administered 2016-01-19: 40 mg via INTRAVENOUS

## 2016-01-19 MED ORDER — CEFAZOLIN SODIUM-DEXTROSE 2-4 GM/100ML-% IV SOLN
INTRAVENOUS | Status: AC
  Administered 2016-01-19: 2 g via INTRAVENOUS
  Filled 2016-01-19: qty 100

## 2016-01-19 MED ORDER — FENTANYL CITRATE (PF) 100 MCG/2ML IJ SOLN
INTRAMUSCULAR | Status: DC | PRN
  Administered 2016-01-19 (×2): 100 ug via INTRAVENOUS

## 2016-01-19 MED ORDER — SODIUM CHLORIDE 0.9 % IJ SOLN
INTRAMUSCULAR | Status: AC
  Filled 2016-01-19: qty 50

## 2016-01-19 MED ORDER — DEXAMETHASONE SODIUM PHOSPHATE 10 MG/ML IJ SOLN
INTRAMUSCULAR | Status: DC | PRN
  Administered 2016-01-19: 4 mg via INTRAVENOUS

## 2016-01-19 SURGICAL SUPPLY — 42 items
AGENT HMST MTR 8 SURGIFLO (HEMOSTASIS)
ANCHOR TIS RET SYS 235ML (MISCELLANEOUS) ×3 IMPLANT
APPLICATOR SURGIFLO (MISCELLANEOUS) IMPLANT
APPLIER CLIP LOGIC TI 5 (MISCELLANEOUS) ×6 IMPLANT
APR CLP MED LRG 33X5 (MISCELLANEOUS) ×2
BAG TISS RTRVL C235 10X14 (MISCELLANEOUS) ×1
BLADE SURG 11 STRL SS SAFETY (MISCELLANEOUS) ×3 IMPLANT
CANISTER SUCT 1200ML W/VALVE (MISCELLANEOUS) ×3 IMPLANT
CANNULA DILATOR 10 W/SLV (CANNULA) ×2 IMPLANT
CANNULA DILATOR 10MM W/SLV (CANNULA) ×1
CATH CHOLANG 76X19 KUMAR (CATHETERS) ×3 IMPLANT
CHLORAPREP W/TINT 26ML (MISCELLANEOUS) ×3 IMPLANT
CLOSURE WOUND 1/2 X4 (GAUZE/BANDAGES/DRESSINGS) ×1
DEFOGGER SCOPE WARMER CLEARIFY (MISCELLANEOUS) ×3 IMPLANT
DRAPE C-ARM XRAY 36X54 (DRAPES) ×3 IMPLANT
DRAPE INCISE IOBAN 66X45 STRL (DRAPES) ×3 IMPLANT
DRESSING TELFA 4X3 1S ST N-ADH (GAUZE/BANDAGES/DRESSINGS) ×3 IMPLANT
DRSG TEGADERM 2-3/8X2-3/4 SM (GAUZE/BANDAGES/DRESSINGS) ×12 IMPLANT
ELECT REM PT RETURN 9FT ADLT (ELECTROSURGICAL) ×3
ELECTRODE REM PT RTRN 9FT ADLT (ELECTROSURGICAL) ×1 IMPLANT
GLOVE BIO SURGEON STRL SZ7 (GLOVE) ×3 IMPLANT
GOWN STRL REUS W/ TWL LRG LVL3 (GOWN DISPOSABLE) ×3 IMPLANT
GOWN STRL REUS W/TWL LRG LVL3 (GOWN DISPOSABLE) ×9
GRASPER SUT TROCAR 14GX15 (MISCELLANEOUS) ×3 IMPLANT
HEMOSTAT SURGICEL 2X3 (HEMOSTASIS) IMPLANT
IRRIGATION STRYKERFLOW (MISCELLANEOUS) ×1 IMPLANT
IRRIGATOR STRYKERFLOW (MISCELLANEOUS) ×3
IV LACTATED RINGERS 1000ML (IV SOLUTION) ×3 IMPLANT
KIT RM TURNOVER STRD PROC AR (KITS) ×3 IMPLANT
LABEL OR SOLS (LABEL) IMPLANT
NDL INSUFF ACCESS 14 VERSASTEP (NEEDLE) ×3 IMPLANT
PACK LAP CHOLECYSTECTOMY (MISCELLANEOUS) ×3 IMPLANT
SCISSORS METZENBAUM CVD 33 (INSTRUMENTS) ×3 IMPLANT
SLEEVE ENDOPATH XCEL 5M (ENDOMECHANICALS) ×9 IMPLANT
SPOGE SURGIFLO 8M (HEMOSTASIS)
SPONGE SURGIFLO 8M (HEMOSTASIS) IMPLANT
STRIP CLOSURE SKIN 1/2X4 (GAUZE/BANDAGES/DRESSINGS) ×2 IMPLANT
SUT VIC AB 0 SH 27 (SUTURE) ×3 IMPLANT
SUT VIC AB 4-0 FS2 27 (SUTURE) ×3 IMPLANT
SWABSTK COMLB BENZOIN TINCTURE (MISCELLANEOUS) ×3 IMPLANT
TROCAR XCEL NON-BLD 5MMX100MML (ENDOMECHANICALS) ×3 IMPLANT
TUBING INSUFFLATOR HI FLOW (MISCELLANEOUS) ×3 IMPLANT

## 2016-01-19 NOTE — Progress Notes (Signed)
Bleeding at umbilical port site. Unresponsive to pressure. 3 cc 1& Xylocaine. 2, 4-0 nylon skin sutures placed. Modest stinging with needle passage, resolved. Dry dressing. Wound care reviewed w/ patient and mother.

## 2016-01-19 NOTE — Discharge Instructions (Signed)

## 2016-01-19 NOTE — Op Note (Addendum)
Preop diagnosis: Cholelithiasis  Post op diagnosis: Same  Operation: Laparoscopy cholecystectomy and intraoperative cholangiogram  Surgeon: Kathreen CosierS. G. Sankar    Assistant: Laneta SimmersJamie Benson, RN, FA   Anesthesia: Gen.  Complications: None  EBL: Less than 20 mL  Drains: None  Description: Patient was put to sleep in supine position the operating table. Abdomen was prepped and draped sterile field and timeout performed. Port incision was made at the umbilicus and a Veress needle position in the peritoneal cavity verified of the hanging drop method. Pneumoperitoneum was obtained followed by placement of a 10 mm port. Camera was introduced and then with good visualization epigastric and 2 lateral 5 mm ports were placed. The gallbladder was noted to be free of any adhesions and showed no acute changes. With careful traction the cystic duct area was dissected. The common bile duct was visualized and appeared to be of normal size. The cystic duct was freed as also the cystic artery just lateral to this. Kumar clamp and catheter were positioned and cholangiogram was performed which showed good filling of the bile duct both proximal and distal with no filling defects or obstruction to flow. The Kumar catheter was used to decompress the gallbladder and then removed. The cystic duct and artery were hemoclipped and cut. Gallbladder was then dissected free from its bed using cautery for control of bleeding. Small amount of fluid used to irrigate out the right upper quadrant and  suctioned out. With the 5 mm scope in the epigastric port site the gallbladder was placed in a retrieval bag and brought out through the umbilical port site. Gallbladder contained some thick bile and tiny millimeters sized stones. The gallbladder bed was inspected and there was no bleeding or any bile leak noted. The EF umbilical port side was closed with 0 Vicryl and the remaining ports removed after release of the pneumoperitoneum. All incisions  were closed with subcuticular 4-0 Vicryl reinforced with Steri-Strips and tincture benzoin. Dry sterile dressing was placed with Telfa and Tegaderm. Patient subsequently extubated and returned recovery room stable condition

## 2016-01-19 NOTE — Interval H&P Note (Signed)
History and Physical Interval Note:  01/19/2016 9:25 AM  Andrea Pope  has presented today for surgery, with the diagnosis of GALLSTONES  The various methods of treatment have been discussed with the patient and family. After consideration of risks, benefits and other options for treatment, the patient has consented to  Procedure(s): LAPAROSCOPIC CHOLECYSTECTOMY WITH INTRAOPERATIVE CHOLANGIOGRAM (N/A) as a surgical intervention .  The patient's history has been reviewed, patient examined, no change in status, stable for surgery.  I have reviewed the patient's chart and labs.  Questions were answered to the patient's satisfaction.     SANKAR,SEEPLAPUTHUR G

## 2016-01-19 NOTE — OR Nursing (Signed)
Patient up to bathroom and reassessed wound.  No drainage noted at umbilicus.  Ice pack applied.  Further instructions provided to patient and mother regarding handling this site.  Both feel comfortable with information.

## 2016-01-19 NOTE — Anesthesia Postprocedure Evaluation (Signed)
Anesthesia Post Note  Patient: Andrea Pope  Procedure(s) Performed: Procedure(s) (LRB): LAPAROSCOPIC CHOLECYSTECTOMY WITH INTRAOPERATIVE CHOLANGIOGRAM (N/A)  Patient location during evaluation: PACU Anesthesia Type: General Level of consciousness: awake and alert and oriented Pain management: pain level controlled Vital Signs Assessment: post-procedure vital signs reviewed and stable Respiratory status: spontaneous breathing Cardiovascular status: blood pressure returned to baseline Postop Assessment: no headache Anesthetic complications: no    Last Vitals:  Vitals:   01/19/16 0811  BP: 139/82  Pulse: 62  Resp: 16  Temp: 36.1 C    Last Pain:  Vitals:   01/19/16 0811  TempSrc: Tympanic                 Mitzie NaJennifer Jenisse Vullo

## 2016-01-19 NOTE — Transfer of Care (Signed)
Immediate Anesthesia Transfer of Care Note  Patient: Andrea Pope  Procedure(s) Performed: Procedure(s): LAPAROSCOPIC CHOLECYSTECTOMY WITH INTRAOPERATIVE CHOLANGIOGRAM (N/A)  Patient Location: PACU  Anesthesia Type:General and Regional  Level of Consciousness: awake, oriented and sedated  Airway & Oxygen Therapy: Patient Spontanous Breathing  Post-op Assessment: stable  Post vital signs: stable  Last Vitals:  Vitals:   01/19/16 0811  BP: 139/82  Pulse: 62  Resp: 16  Temp: 36.1 C    Last Pain:  Vitals:   01/19/16 0811  TempSrc: Tympanic         Complications: No apparent anesthesia complications

## 2016-01-19 NOTE — OR Nursing (Signed)
Dressing over umbilicus was saturated upon arrival to post op.  Dressing removed and another pressure dressing applied.  Within a few hours, dressing was saturated again.  Dressing has been changed now 3 times with 4 x 4 and pressure/tegaderm.  Dr. Evette CristalSankar notified via telephone at 2:45 pm to inform him of this situation.  He wanted another 4 x 4 pressure dressing applied.  Dr. Lemar LivingsByrnett was called by Dr. Evette CristalSankar and arrived in patient room at 2:55 pm to assess incision.  He is going to give patient a local anesthetic and place a stitch at umbilicus.  Patient agrees to this procedure.  Nausea is improved since receiving Phenergan.  Sleepy but doing better overall.  Waiting for Dr. Lemar LivingsByrnett to return.

## 2016-01-19 NOTE — Anesthesia Preprocedure Evaluation (Signed)
Anesthesia Evaluation  Patient identified by MRN, date of birth, ID band Patient awake    Reviewed: Allergy & Precautions, NPO status , Patient's Chart, lab work & pertinent test results  History of Anesthesia Complications Negative for: history of anesthetic complications  Airway Mallampati: III  TM Distance: >3 FB Neck ROM: Full    Dental no notable dental hx.    Pulmonary neg pulmonary ROS, neg sleep apnea, neg COPD,    breath sounds clear to auscultation- rhonchi (-) wheezing      Cardiovascular Exercise Tolerance: Good (-) hypertension(-) angina(-) CAD and (-) Past MI  Rhythm:Regular Rate:Normal - Systolic murmurs and - Diastolic murmurs    Neuro/Psych negative neurological ROS  negative psych ROS   GI/Hepatic negative GI ROS, Neg liver ROS,   Endo/Other  negative endocrine ROSneg diabetes  Renal/GU negative Renal ROS     Musculoskeletal negative musculoskeletal ROS (+)   Abdominal (+) + obese,   Peds  Hematology  (+) anemia ,   Anesthesia Other Findings   Reproductive/Obstetrics                             Anesthesia Physical Anesthesia Plan  ASA: II  Anesthesia Plan: General   Post-op Pain Management:    Induction: Intravenous  Airway Management Planned: Oral ETT  Additional Equipment:   Intra-op Plan:   Post-operative Plan: Extubation in OR  Informed Consent: I have reviewed the patients History and Physical, chart, labs and discussed the procedure including the risks, benefits and alternatives for the proposed anesthesia with the patient or authorized representative who has indicated his/her understanding and acceptance.   Dental advisory given  Plan Discussed with: CRNA and Anesthesiologist  Anesthesia Plan Comments:         Anesthesia Quick Evaluation

## 2016-01-19 NOTE — Anesthesia Procedure Notes (Signed)
Procedure Name: Intubation Date/Time: 01/19/2016 10:00 AM Performed by: Lynnae JanuaryEASTERLING, Glendola Friedhoff LYNNE Pre-anesthesia Checklist: Patient identified, Emergency Drugs available, Suction available, Patient being monitored and Timeout performed Patient Re-evaluated:Patient Re-evaluated prior to inductionOxygen Delivery Method: Circle system utilized Preoxygenation: Pre-oxygenation with 100% oxygen Intubation Type: IV induction Ventilation: Mask ventilation without difficulty Laryngoscope Size: Mac and 3 Grade View: Grade I Tube type: Oral Tube size: 7.0 mm Number of attempts: 1 Airway Equipment and Method: Stylet Placement Confirmation: ETT inserted through vocal cords under direct vision,  positive ETCO2 and breath sounds checked- equal and bilateral Secured at: 23 cm Tube secured with: Tape Dental Injury: Teeth and Oropharynx as per pre-operative assessment

## 2016-01-19 NOTE — H&P (Signed)
Andrea Pope is an 45 y.o. female.   Chief Complaint: abdominal painHPI: 45 yr old female with recurring episodes of ruq abd pain, radiating to her back. Episodes last 20-30 mins , severe and doubles her over. No fever, chills. US confirmed gallstones  Past Medical History:  Diagnosis Date  . Abnormal thyroid stimulating hormone level   . Allergy   . Anemia   . Encounter for initial prescription of injectable contraceptive   . Female stress incontinence   . High cholesterol   . History of ectopic pregnancy 11/29/2015  . Menorrhagia with irregular cycle   . Ovarian cyst 11/29/2015  . Spider veins of both lower extremities     Past Surgical History:  Procedure Laterality Date  . NO PAST SURGERIES      Family History  Problem Relation Age of Onset  . Hypertension Mother   . Thyroid disease Mother   . Hypertension Father   . Cancer Father     Prostate  . Diabetes Brother   . Diabetes Other   . Cancer Paternal Uncle     Prostate  . Diabetes Maternal Grandmother   . Cancer Paternal Grandfather     Prostate  . Breast cancer Neg Hx   . Ovarian cancer Neg Hx   . Heart disease Neg Hx    Social History:  reports that she has never smoked. She has never used smokeless tobacco. She reports that she does not drink alcohol or use drugs.  Allergies: No Known Allergies  Medications Prior to Admission  Medication Sig Dispense Refill  . ibuprofen (ADVIL,MOTRIN) 800 MG tablet Take 1 tablet (800 mg total) by mouth every 8 (eight) hours as needed. 50 tablet 1  . fluticasone (FLONASE) 50 MCG/ACT nasal spray Place 2 sprays into the nose daily as needed.       Results for orders placed or performed during the hospital encounter of 01/19/16 (from the past 48 hour(s))  Pregnancy, urine POC     Status: None   Collection Time: 01/19/16  8:05 AM  Result Value Ref Range   Preg Test, Ur NEGATIVE NEGATIVE    Comment:        THE SENSITIVITY OF THIS METHODOLOGY IS >24 mIU/mL    No  results found.  Review of Systems  Constitutional: Negative.   Respiratory: Negative.   Cardiovascular: Negative.   Gastrointestinal: Positive for abdominal pain. Negative for constipation, diarrhea, heartburn, nausea and vomiting.  Genitourinary: Negative.     Blood pressure 139/82, pulse 62, temperature 97 F (36.1 C), temperature source Tympanic, resp. rate 16, weight 198 lb (89.8 kg), last menstrual period 12/16/2015, SpO2 100 %. Physical Exam  Constitutional: She is oriented to person, place, and time. She appears well-developed and well-nourished.  HENT:  Head: Normocephalic.  Eyes: Conjunctivae are normal. No scleral icterus.  Cardiovascular: Normal rate, regular rhythm and normal heart sounds.   Respiratory: Effort normal and breath sounds normal.  GI: Soft. Bowel sounds are normal. She exhibits no mass. There is no tenderness.  Neurological: She is alert and oriented to person, place, and time.  Skin: Skin is warm and dry.     Assessment/Plan Biliary colic. Proceed with cholecystectomy as scheduled. Pt was advised fully on her office visit on 12/13/15  Kieth BrightlySANKAR,Santasia Rew G, MD 01/19/2016, 9:20 AM

## 2016-01-22 LAB — SURGICAL PATHOLOGY

## 2016-01-24 ENCOUNTER — Encounter: Payer: Self-pay | Admitting: Family Medicine

## 2016-01-25 ENCOUNTER — Telehealth: Payer: Self-pay | Admitting: Family Medicine

## 2016-01-25 ENCOUNTER — Encounter: Payer: Self-pay | Admitting: Family Medicine

## 2016-01-25 ENCOUNTER — Ambulatory Visit (INDEPENDENT_AMBULATORY_CARE_PROVIDER_SITE_OTHER): Payer: Medicaid Other | Admitting: Family Medicine

## 2016-01-25 VITALS — BP 118/72 | HR 79 | Temp 99.4°F | Wt 197.1 lb

## 2016-01-25 DIAGNOSIS — Z23 Encounter for immunization: Secondary | ICD-10-CM | POA: Diagnosis not present

## 2016-01-25 DIAGNOSIS — R030 Elevated blood-pressure reading, without diagnosis of hypertension: Secondary | ICD-10-CM

## 2016-01-25 DIAGNOSIS — IMO0001 Reserved for inherently not codable concepts without codable children: Secondary | ICD-10-CM

## 2016-01-25 DIAGNOSIS — I10 Essential (primary) hypertension: Secondary | ICD-10-CM | POA: Insufficient documentation

## 2016-01-25 NOTE — Telephone Encounter (Signed)
-----   Message from Kerman PasseyMelinda P Lada, MD sent at 12/05/2015 12:25 PM EDT ----- Regarding: right ovarian cyst, due repeat US 8 weeks Due in 2 weeks, 01/30/16

## 2016-01-25 NOTE — Telephone Encounter (Signed)
Pt notified and has an upcoming appt with DR. D

## 2016-01-25 NOTE — Progress Notes (Signed)
BP 118/72   Pulse 79   Temp 99.4 F (37.4 C)   Wt 197 lb 1.6 oz (89.4 kg)   LMP 12/16/2015 (LMP Unknown)   SpO2 99%   BMI 30.41 kg/m    Subjective:    Patient ID: Andrea Pope, female    DOB: 1970-12-09, 45 y.o.   MRN: [id number]  HPI: Andrea Pope is a 45 y.o. female  Chief Complaint  Patient presents with  . Hypertension    BP check after surgury   Patient is here for elevated blood pressure follow-up 139 over something before surgery; she was not in pain; was under a little stress Had sushi but nothing really salty, some Goldfish crackers No decongestants No black licorice Had been pretty active When she was in surgery, 169 over something Never happened before No fevers Holds stress in Taking oxycodone, but not taking any for two days Last dose of ibuprofen was weeks before surgery No beef and no pork No new RLQ pain She underwent the lap choly and path report visible, reviewed  Depression screen [facility] 2/9 01/25/2016 11/29/2015 07/19/2015 05/12/2015 01/10/2015  Decreased Interest 0 0 0 0 0  Down, Depressed, Hopeless 0 1 0 0 0  PHQ - 2 Score 0 1 0 0 0   Relevant past medical, surgical, family and social history reviewed Past Medical History:  Diagnosis Date  . Abnormal thyroid stimulating hormone level   . Allergy   . Anemia   . Encounter for initial prescription of injectable contraceptive   . Female stress incontinence   . High cholesterol   . History of ectopic pregnancy 11/29/2015  . Menorrhagia with irregular cycle   . Ovarian cyst 11/29/2015  . Spider veins of both lower extremities    Past Surgical History:  Procedure Laterality Date  . CHOLECYSTECTOMY N/A 01/19/2016   Procedure: LAPAROSCOPIC CHOLECYSTECTOMY WITH INTRAOPERATIVE CHOLANGIOGRAM;  Surgeon: Kieth Brightly, MD;  Location: [facility] ORS;  Service: General;  Laterality: N/A;   Family History  Problem Relation Age of Onset  . Hypertension Mother   . Thyroid disease Mother   .  Hypertension Father   . Cancer Father     Prostate  . Diabetes Brother   . Diabetes Other   . Cancer Paternal Uncle     Prostate  . Diabetes Maternal Grandmother   . Cancer Paternal Grandfather     Prostate  . Breast cancer Neg Hx   . Ovarian cancer Neg Hx   . Heart disease Neg Hx    Social History  Substance Use Topics  . Smoking status: Never Smoker  . Smokeless tobacco: Never Used  . Alcohol use No   Interim medical history since last visit reviewed. Allergies and medications reviewed  Review of Systems Per HPI unless specifically indicated above     Objective:    BP 118/72   Pulse 79   Temp 99.4 F (37.4 C)   Wt 197 lb 1.6 oz (89.4 kg)   LMP 12/16/2015 (LMP Unknown)   SpO2 99%   BMI 30.41 kg/m   Wt Readings from Last 3 Encounters:  01/25/16 197 lb 1.6 oz (89.4 kg)  01/19/16 198 lb (89.8 kg)  01/09/16 198 lb (89.8 kg)    Physical Exam  Constitutional: She appears well-developed and well-nourished. No distress.  Eyes: EOM are normal. No scleral icterus.  Neck: No thyromegaly present.  Cardiovascular: Normal rate and regular rhythm.   Pulmonary/Chest: Effort normal and breath sounds normal.  Abdominal:  Soft. Bowel sounds are normal. She exhibits no distension. There is tenderness (mild).  Surgical sites C/D/I  Musculoskeletal: She exhibits no edema.  Skin: No pallor.  Psychiatric: She has a normal mood and affect. Her behavior is normal. Judgment and thought content normal.   Results for orders placed or performed during the hospital encounter of 01/19/16  Pregnancy, urine POC  Result Value Ref Range   Preg Test, Ur NEGATIVE NEGATIVE  Surgical pathology  Result Value Ref Range   SURGICAL PATHOLOGY      Surgical Pathology CASE: [id number] [facility]     SPECIMEN SUBMITTED: A. Gallbladder  CLINICAL HISTORY: None provided  PRE-OPERATIVE DIAGNOSIS: Gallstones  POST-OPERATIVE DIAGNOSIS: Same as  pre-op     DIAGNOSIS: A. GALLBLADDER; LAPAROSCOPIC CHOLECYSTECTOMY: - CHRONIC CHOLECYSTITIS WITH CHOLELITHIASIS. - POLYPOID CHOLESTEROLOSIS. - REACTIVE CYSTIC DUCT LYMPH NODE. - NEGATIVE FOR MALIGNANCY.   GROSS DESCRIPTION: A. Labeled: Gallbladder  Size of specimen: 8.5 x 3.4 x 1 cm  Previously opened: Yes  External surface: Pale green and smooth  Wall thickness: 0.1-0.3 cm  Mucosa: Bile-stained and velvety with a raised yellow cholesterol type polyp measuring 0.2 cm in greatest dimensions.  Stones present: Yes, multiple dark green to black calculi from less than 0.1-0.2 cm in diameter  Other findings: Noted at the cystic duct is a pink red lymph node candidate measuring 0.9 x 0.6 x 0.3 cm  Block summary:  1 - representative sections and lymph node candidate     Final Diagnosis performed by Elijah Birkara Rubinas, MD.  Electronically signed 01/22/2016 12:13:01PM    The electronic signature indicates that the named Attending Pathologist has evaluated the specimen  Technical component performed at [facility], [address], [place], [place] [address] Lab: [phone] Dir: Titus DubinWilliam F. Cato MulliganHancock, MD  Professional component performed at [facility], [facility], [address], [place], [place] [address] Lab: [phone] Dir: Georgiann Cockerara C. Rubinas, MD        Assessment & Plan:   Problem List Items Addressed This Visit      Cardiovascular and Mediastinum   White coat hypertension - Primary    Went through numerous causes of potential rises in BP; does not sound like pain, sodium overload, excessive NSAIDs, etc caused elevated pressures; may have been stress, white coat HTN; in the future, I would be willing to prescribe a mild sedative if upcoming surgery or procedure or plane flight; try DASH guidelines; given that her BP is normal today, I will not put her on an antihypertensive, and we'll watch; she agrees with plan       Other Visit Diagnoses    Needs flu  shot       Relevant Orders   Flu Vaccine QUAD 36+ mos PF IM (Fluarix & Fluzone Quad PF) (Completed)      Follow up plan: No Follow-up on file.  An after-visit summary was printed and given to the patient at check-out.  Please see the patient instructions which may contain other information and recommendations beyond what is mentioned above in the assessment and plan.  Orders Placed This Encounter  Procedures  . Flu Vaccine QUAD 36+ mos PF IM (Fluarix & Fluzone Quad PF)  Face-to-face time with patient was more than 15 minutes, >50% time spent counseling and coordination of care

## 2016-01-25 NOTE — Telephone Encounter (Signed)
Patient is due for a repeat pelvic US soon She is now seeing Dr. Greggory KeeneFrancesco Please call patient and see if she is going to have him get the pelvic US; due 8-12 weeks after the last one, so 02/01/16 will be when due or just after that

## 2016-01-25 NOTE — Patient Instructions (Addendum)
Your goal blood pressure is less than 140 mmHg on top. Try to follow the DASH guidelines (DASH stands for Dietary Approaches to Stop Hypertension) Try to limit the sodium in your diet.  Ideally, consume less than 1.5 grams (less than 1,500mg ) per day. Do not add salt when cooking or at the table.  Check the sodium amount on labels when shopping, and choose items lower in sodium when given a choice. Avoid or limit foods that already contain a lot of sodium. Eat a diet rich in fruits and vegetables and whole grains.  Try to limit saturated fats in your diet (bologna, hot dogs, barbeque, cheeseburgers, hamburgers, steak, bacon, sausage, cheese, etc.) and get more fresh fruits, vegetables, and whole grains  If you know of an upcoming stressful event, just call me and we'll get you something to take   DASH Eating Plan DASH stands for "Dietary Approaches to Stop Hypertension." The DASH eating plan is a healthy eating plan that has been shown to reduce high blood pressure (hypertension). Additional health benefits may include reducing the risk of type 2 diabetes mellitus, heart disease, and stroke. The DASH eating plan may also help with weight loss. WHAT DO I NEED TO KNOW ABOUT THE DASH EATING PLAN? For the DASH eating plan, you will follow these general guidelines:  Choose foods with a percent daily value for sodium of less than 5% (as listed on the food label).  Use salt-free seasonings or herbs instead of table salt or sea salt.  Check with your health care provider or pharmacist before using salt substitutes.  Eat lower-sodium products, often labeled as "lower sodium" or "no salt added."  Eat fresh foods.  Eat more vegetables, fruits, and low-fat dairy products.  Choose whole grains. Look for the word "whole" as the first word in the ingredient list.  Choose fish and skinless chicken or [place] more often than red meat. Limit fish, poultry, and meat to 6 oz (170 g) each day.  Limit  sweets, desserts, sugars, and sugary drinks.  Choose heart-healthy fats.  Limit cheese to 1 oz (28 g) per day.  Eat more home-cooked food and less restaurant, buffet, and fast food.  Limit fried foods.  Cook foods using methods other than frying.  Limit canned vegetables. If you do use them, rinse them well to decrease the sodium.  When eating at a restaurant, ask that your food be prepared with less salt, or no salt if possible. WHAT FOODS CAN I EAT? Seek help from a dietitian for individual calorie needs. Grains Whole grain or whole wheat bread. Brown rice. Whole grain or whole wheat pasta. Quinoa, bulgur, and whole grain cereals. Low-sodium cereals. Corn or whole wheat flour tortillas. Whole grain cornbread. Whole grain crackers. Low-sodium crackers. Vegetables Fresh or frozen vegetables (raw, steamed, roasted, or grilled). Low-sodium or reduced-sodium tomato and vegetable juices. Low-sodium or reduced-sodium tomato sauce and paste. Low-sodium or reduced-sodium canned vegetables.  Fruits All fresh, canned (in natural juice), or frozen fruits. Meat and Other Protein Products Ground beef (85% or leaner), grass-fed beef, or beef trimmed of fat. Skinless chicken or [place]. Ground chicken or [place]. Pork trimmed of fat. All fish and seafood. Eggs. Dried beans, peas, or lentils. Unsalted nuts and seeds. Unsalted canned beans. Dairy Low-fat dairy products, such as skim or 1% milk, 2% or reduced-fat cheeses, low-fat ricotta or cottage cheese, or plain low-fat yogurt. Low-sodium or reduced-sodium cheeses. Fats and Oils Tub margarines without trans fats. Light or reduced-fat mayonnaise and salad  dressings (reduced sodium). Avocado. Safflower, olive, or canola oils. Natural peanut or almond butter. Other Unsalted popcorn and pretzels. The items listed above may not be a complete list of recommended foods or beverages. Contact your dietitian for more options. WHAT FOODS ARE NOT  RECOMMENDED? Grains White bread. White pasta. White rice. Refined cornbread. Bagels and croissants. Crackers that contain trans fat. Vegetables Creamed or fried vegetables. Vegetables in a cheese sauce. Regular canned vegetables. Regular canned tomato sauce and paste. Regular tomato and vegetable juices. Fruits Dried fruits. Canned fruit in light or heavy syrup. Fruit juice. Meat and Other Protein Products Fatty cuts of meat. Ribs, chicken wings, bacon, sausage, bologna, salami, chitterlings, fatback, hot dogs, bratwurst, and packaged luncheon meats. Salted nuts and seeds. Canned beans with salt. Dairy Whole or 2% milk, cream, half-and-half, and cream cheese. Whole-fat or sweetened yogurt. Full-fat cheeses or blue cheese. Nondairy creamers and whipped toppings. Processed cheese, cheese spreads, or cheese curds. Condiments Onion and garlic salt, seasoned salt, table salt, and sea salt. Canned and packaged gravies. Worcestershire sauce. Tartar sauce. Barbecue sauce. Teriyaki sauce. Soy sauce, including reduced sodium. Steak sauce. Fish sauce. Oyster sauce. Cocktail sauce. Horseradish. Ketchup and mustard. Meat flavorings and tenderizers. Bouillon cubes. Hot sauce. Tabasco sauce. Marinades. Taco seasonings. Relishes. Fats and Oils Butter, stick margarine, lard, shortening, ghee, and bacon fat. Coconut, palm kernel, or palm oils. Regular salad dressings. Other Pickles and olives. Salted popcorn and pretzels. The items listed above may not be a complete list of foods and beverages to avoid. Contact your dietitian for more information. WHERE CAN I FIND MORE INFORMATION? National Heart, Lung, and Blood Institute: CableProm[employer].itwww.nhlbi.nih.gov/health/health-topics/topics/dash/   This information is not intended to replace advice given to you by your health care provider. Make sure you discuss any questions you have with your health care provider.   Document Released: 04/11/2011 Document Revised: 05/13/2014  Document Reviewed: 02/24/2013 Elsevier Interactive Patient Education [employer]2016 Elsevier Inc.

## 2016-01-25 NOTE — Assessment & Plan Note (Addendum)
Went through numerous causes of potential rises in BP; does not sound like pain, sodium overload, excessive NSAIDs, etc caused elevated pressures; may have been stress, white coat HTN; in the future, I would be willing to prescribe a mild sedative if upcoming surgery or procedure or plane flight; try DASH guidelines; given that her BP is normal today, I will not put her on an antihypertensive, and we'll watch; she agrees with plan

## 2016-01-26 ENCOUNTER — Encounter: Payer: Self-pay | Admitting: Family Medicine

## 2016-01-31 ENCOUNTER — Encounter: Payer: Self-pay | Admitting: General Surgery

## 2016-01-31 ENCOUNTER — Ambulatory Visit (INDEPENDENT_AMBULATORY_CARE_PROVIDER_SITE_OTHER): Payer: Medicaid Other | Admitting: Obstetrics and Gynecology

## 2016-01-31 ENCOUNTER — Ambulatory Visit (INDEPENDENT_AMBULATORY_CARE_PROVIDER_SITE_OTHER): Payer: Medicaid Other | Admitting: General Surgery

## 2016-01-31 VITALS — BP 128/78 | HR 64 | Resp 12 | Ht 68.0 in | Wt 198.0 lb

## 2016-01-31 VITALS — BP 153/82 | HR 76 | Ht 67.5 in | Wt 197.8 lb

## 2016-01-31 DIAGNOSIS — N83201 Unspecified ovarian cyst, right side: Secondary | ICD-10-CM | POA: Diagnosis not present

## 2016-01-31 DIAGNOSIS — K8020 Calculus of gallbladder without cholecystitis without obstruction: Secondary | ICD-10-CM

## 2016-01-31 DIAGNOSIS — N946 Dysmenorrhea, unspecified: Secondary | ICD-10-CM

## 2016-01-31 DIAGNOSIS — N941 Unspecified dyspareunia: Secondary | ICD-10-CM | POA: Diagnosis not present

## 2016-01-31 DIAGNOSIS — N920 Excessive and frequent menstruation with regular cycle: Secondary | ICD-10-CM

## 2016-01-31 NOTE — Patient Instructions (Signed)
1. Pelvic ultrasound-1 week 2. Return on 02/20/2016 for follow-up 3. Lo Loestrin oral contraceptive to be started on the Sunday after next menses

## 2016-01-31 NOTE — Progress Notes (Signed)
Chief complaint: Follow-up on the following issues 1. Menorrhagia with regular cycle; Normal laboratory workup and normal ultrasound; patient is an OCP candidate for cycle regulation and dysmenorrhea regulation  2. Dysmenorrhea; deep thrusting dyspareunia  3. Abnormal weight gain, possibly related to Depo-Provera  4. Suspected endometriosis based on history and physical  5. Doubt PCO  Patient is status post laparoscopic cholecystectomy several weeks ago. She presents now for consideration of starting birth control pills to help manage menorrhagia, dysmenorrhea, deep thrusting dyspareunia. It is believed that the patient may have some endometriosis based on history and physical exam. Recent endometrial biopsy revealed secretory endometrium without hyperplasia or carcinoma Ultrasound on 12/05/2015 was notable for a 3.4 cm simple right ovarian cyst  Past medical history, past surgical history, problems, medications, and allergies are reviewed  OBJECTIVE: BP (!) 153/82   Pulse 76   Ht 5' 7.5" (1.715 m)   Wt 197 lb 12.8 oz (89.7 kg)   LMP 12/16/2015 (LMP Unknown)   BMI 30.52 kg/m   Pleasant African-American female in no acute distress Abdomen: Soft, nontender; laparoscopy scars are healing well Pelvic exam: External genitalia normal BUS normal Vagina normal Cervix-normal in appearance; 2/4 cervical motion tenderness Uterus-anteverted, normal size and shape, mobile, 2/4 tender Adnexa-nonpalpable nontender Rectovaginal-normal external exam  ASSESSMENT:  1. Menorrhagia with regular cycle; Normal laboratory workup and normal ultrasound; patient is an OCP candidate for cycle regulation and dysmenorrhea regulation  2. Dysmenorrhea; deep thrusting dyspareunia  3. Abnormal weight gain, possibly related to Depo-Provera  4. Suspected endometriosis based on history and physical  5. History of ovarian cyst, right   PLAN:  1. Pelvic ultrasound-1 week 2. Return on 02/20/2016  for follow-up 3. Lo Loestrin oral contraceptive to be started on the Sunday after next menses  A total of 15 minutes were spent face-to-face with the patient during this encounter and over half of that time dealt with counseling and coordination of care.  Herold HarmsMartin A Halvor Behrend, MD  Note: This dictation was prepared with Dragon dictation along with smaller phrase technology. Any transcriptional errors that result from this process are unintentional.

## 2016-01-31 NOTE — Patient Instructions (Signed)
The patient is aware to call back for any questions or concerns.  

## 2016-01-31 NOTE — Progress Notes (Signed)
Patient ID: Andrea Pope, female   DOB: [birth year], 45 y.o.   MRN: [id number]  Chief Complaint  Patient presents with  . Routine Post Op    HPI Andrea Pope is a 45 y.o. female here today for her post op laparoscopic cholecystectomy done on 01/19/16. Patient states she is doing well. She did have 2 stitches placed prior to discharge due to a little bleed at umbilical site. Denies any gastrointestinal issues.  I have reviewed the history of present illness with the patient.  HPI  Past Medical History:  Diagnosis Date  . Abnormal thyroid stimulating hormone level   . Allergy   . Anemia   . Encounter for initial prescription of injectable contraceptive   . Female stress incontinence   . High cholesterol   . History of ectopic pregnancy 11/29/2015  . Menorrhagia with irregular cycle   . Ovarian cyst 11/29/2015  . Spider veins of both lower extremities     Past Surgical History:  Procedure Laterality Date  . CHOLECYSTECTOMY N/A 01/19/2016   Procedure: LAPAROSCOPIC CHOLECYSTECTOMY WITH INTRAOPERATIVE CHOLANGIOGRAM;  Surgeon: Kieth BrightlySeeplaputhur G Sankar, MD;  Location: [facility] ORS;  Service: General;  Laterality: N/A;    Family History  Problem Relation Age of Onset  . Hypertension Mother   . Thyroid disease Mother   . Hypertension Father   . Cancer Father     Prostate  . Diabetes Brother   . Diabetes Other   . Cancer Paternal Uncle     Prostate  . Diabetes Maternal Grandmother   . Cancer Paternal Grandfather     Prostate  . Breast cancer Neg Hx   . Ovarian cancer Neg Hx   . Heart disease Neg Hx     Social History Social History  Substance Use Topics  . Smoking status: Never Smoker  . Smokeless tobacco: Never Used  . Alcohol use No    No Known Allergies  Current Outpatient Prescriptions  Medication Sig Dispense Refill  . fluticasone (FLONASE) 50 MCG/ACT nasal spray Place 2 sprays into the nose daily as needed.     Marland Kitchen. ibuprofen (ADVIL,MOTRIN) 800 MG tablet Take 1  tablet (800 mg total) by mouth every 8 (eight) hours as needed. 50 tablet 1   No current facility-administered medications for this visit.     Review of Systems Review of Systems  Constitutional: Negative.   Respiratory: Negative.   Gastrointestinal: Negative.     Blood pressure 128/78, pulse 64, resp. rate 12, height 5\' 8"  (1.727 m), weight 198 lb (89.8 kg), last menstrual period 12/16/2015.  Physical Exam Physical Exam  Constitutional: She is oriented to person, place, and time. She appears well-developed and well-nourished.  Abdominal: Soft. Bowel sounds are normal. She exhibits no distension and no mass. There is no tenderness. There is no rebound and no guarding.    Neurological: She is alert and oriented to person, place, and time.  Skin: Skin is warm and dry.  Psychiatric: Her behavior is normal.  sutures was removed.   Data Reviewed Notes reviewed   Assessment    Cholelithiasis, post cholecystectomy. Stable    Plan    Patient to return as needed.     This information has been scribed by Dorathy DaftMarsha Hatch RN, BSN,BC.  Dorathy DaftHatch, Marsha M 01/31/2016, 3:05 PM

## 2016-02-05 ENCOUNTER — Ambulatory Visit: Payer: Medicaid Other | Admitting: Family Medicine

## 2016-02-07 ENCOUNTER — Ambulatory Visit (INDEPENDENT_AMBULATORY_CARE_PROVIDER_SITE_OTHER): Payer: Medicaid Other

## 2016-02-07 DIAGNOSIS — N83201 Unspecified ovarian cyst, right side: Secondary | ICD-10-CM

## 2016-02-21 ENCOUNTER — Ambulatory Visit (INDEPENDENT_AMBULATORY_CARE_PROVIDER_SITE_OTHER): Payer: Medicaid Other | Admitting: Obstetrics and Gynecology

## 2016-02-21 ENCOUNTER — Encounter: Payer: Self-pay | Admitting: Obstetrics and Gynecology

## 2016-02-21 VITALS — BP 112/77 | HR 70 | Wt 198.3 lb

## 2016-02-21 DIAGNOSIS — N920 Excessive and frequent menstruation with regular cycle: Secondary | ICD-10-CM

## 2016-02-21 DIAGNOSIS — N83201 Unspecified ovarian cyst, right side: Secondary | ICD-10-CM | POA: Diagnosis not present

## 2016-02-21 DIAGNOSIS — N946 Dysmenorrhea, unspecified: Secondary | ICD-10-CM

## 2016-02-21 MED ORDER — NORETHIN ACE-ETH ESTRAD-FE 1-20 MG-MCG PO TABS: 1.0000 | ORAL_TABLET | Freq: Every day | ORAL | 11 refills | Status: DC

## 2016-02-21 NOTE — Progress Notes (Signed)
GYN ENCOUNTER NOTE  Subjective:       Andrea Pope is a 45 y.o. G43P4004 female is here for gynecologic evaluation of the following issues:  1. Pelvic pain 2. Right ovarian cyst 3. Menorrhagia with regular cycle  Patient presents for follow-up after ultrasound for evaluation of pelvic pain and history of right ovarian cyst. Most recent menses was severe with cramping, right-sided component. Endometrial biopsy-secretory endometrium Pelvic ultrasound-4.7 cm septated right ovarian cyst; uterus is slightly enlarged or top normal size; endometrial stripe is normal Patient is controlling her pelvic pain with Motrin 800 mg 3 times a day.     Gynecologic History Patient's last menstrual period was 02/07/2016. Contraception: OCP (estrogen/progesterone)  Obstetric History OB History  Gravida Para Term Preterm AB Living  4 4 4     4   SAB TAB Ectopic Multiple Live Births          4    # Outcome Date GA Lbr Len/2nd Weight Sex Delivery Anes PTL Lv  4 Term 2009   9 lb (4.082 kg) M Vag-Spont   LIV  3 Term 2006   9 lb (4.082 kg) F Vag-Spont   LIV  2 Term 2002   9 lb (4.082 kg) M Vag-Spont   LIV  1 Term 2001   6 lb 12.8 oz (3.084 kg) M Vag-Spont   LIV      Past Medical History:  Diagnosis Date  . Abnormal thyroid stimulating hormone level   . Allergy   . Anemia   . Encounter for initial prescription of injectable contraceptive   . Female stress incontinence   . High cholesterol   . History of ectopic pregnancy 11/29/2015  . Menorrhagia with irregular cycle   . Ovarian cyst 11/29/2015  . Spider veins of both lower extremities     Past Surgical History:  Procedure Laterality Date  . CHOLECYSTECTOMY N/A 01/19/2016   Procedure: LAPAROSCOPIC CHOLECYSTECTOMY WITH INTRAOPERATIVE CHOLANGIOGRAM;  Surgeon: Kieth Brightly, MD;  Location: [facility] ORS;  Service: General;  Laterality: N/A;    Current Outpatient Prescriptions on File Prior to Visit  Medication Sig Dispense Refill  .  fluticasone (FLONASE) 50 MCG/ACT nasal spray Place 2 sprays into the nose daily as needed.     Marland Kitchen ibuprofen (ADVIL,MOTRIN) 800 MG tablet Take 1 tablet (800 mg total) by mouth every 8 (eight) hours as needed. 50 tablet 1   No current facility-administered medications on file prior to visit.     No Known Allergies  Social History   Social History  . Marital status: Married    Spouse name: N/A  . Number of children: N/A  . Years of education: N/A   Occupational History  . Not on file.   Social History Main Topics  . Smoking status: Never Smoker  . Smokeless tobacco: Never Used  . Alcohol use No  . Drug use: No  . Sexual activity: Yes    Partners: Male    Birth control/ protection: None   Other Topics Concern  . Not on file   Social History Narrative  . No narrative on file    Family History  Problem Relation Age of Onset  . Hypertension Mother   . Thyroid disease Mother   . Hypertension Father   . Cancer Father     Prostate  . Diabetes Brother   . Diabetes Other   . Cancer Paternal Uncle     Prostate  . Diabetes Maternal Grandmother   . Cancer Paternal  Grandfather     Prostate  . Breast cancer Neg Hx   . Ovarian cancer Neg Hx   . Heart disease Neg Hx     The following portions of the patient's history were reviewed and updated as appropriate: allergies, current medications, past family history, past medical history, past social history, past surgical history and problem list.  Review of Systems Review of Systems - Per history of present illness  Objective:   BP 112/77   Pulse 70   Wt 198 lb 4.8 oz (89.9 kg)   LMP 02/07/2016   BMI 30.15 kg/m  CONSTITUTIONAL: Well-developed, well-nourished female in no acute distress.  Physical exam-deferred    Assessment:   1. Cyst of right ovary, 4.7 cm with septation - US Pelvis Complete; Future - US Transvaginal Non-OB; Future  2. Dysmenorrhea - US Pelvis Complete; Future - US Transvaginal Non-OB;  Future  3. Menorrhagia with regular cycle - US Pelvis Complete; Future - US Transvaginal Non-OB; Future  Patient recently started Lo Loestrin oral contraceptive. She is status post laparoscopic cholecystectomy in the past month and would prefer to avoid surgical intervention at this time for assessment of ovarian cyst and pelvic pain.      Plan:  1. Pelvic ultrasound to be performed in 5 weeks 2. Follow-up in 6 weeks 3. Continue with Motrin 800 mg 3 times a day and Tylenol when necessary for pelvic pain 4. Continue lo Loestrin oral contraceptive  A total of 15 minutes were spent face-to-face with the patient during this encounter and over half of that time dealt with counseling and coordination of care.  Herold HarmsMartin A Emmanuella Mirante, MD  Note: This dictation was prepared with Dragon dictation along with smaller phrase technology. Any transcriptional errors that result from this process are unintentional.

## 2016-02-21 NOTE — Patient Instructions (Signed)
1. Pelvic ultrasound to be performed in 5 weeks 2. Follow-up in 6 weeks 3. Continue with Motrin 800 mg 3 times a day and Tylenol when necessary for pelvic pain 4. Continue lo Loestrin oral contraceptive

## 2016-03-27 ENCOUNTER — Ambulatory Visit (INDEPENDENT_AMBULATORY_CARE_PROVIDER_SITE_OTHER): Payer: Medicaid Other

## 2016-03-27 DIAGNOSIS — N83201 Unspecified ovarian cyst, right side: Secondary | ICD-10-CM

## 2016-03-27 DIAGNOSIS — N946 Dysmenorrhea, unspecified: Secondary | ICD-10-CM | POA: Diagnosis not present

## 2016-03-27 DIAGNOSIS — N920 Excessive and frequent menstruation with regular cycle: Secondary | ICD-10-CM | POA: Diagnosis not present

## 2016-04-02 ENCOUNTER — Encounter: Payer: Self-pay | Admitting: Family Medicine

## 2016-04-03 MED ORDER — BUPROPION HCL ER (XL) 150 MG PO TB24: 150.0000 mg | ORAL_TABLET | Freq: Every day | ORAL | 0 refills | Status: DC

## 2016-04-04 ENCOUNTER — Encounter: Payer: Self-pay | Admitting: Obstetrics and Gynecology

## 2016-04-04 ENCOUNTER — Ambulatory Visit (INDEPENDENT_AMBULATORY_CARE_PROVIDER_SITE_OTHER): Payer: Medicaid Other | Admitting: Obstetrics and Gynecology

## 2016-04-04 VITALS — BP 141/83 | HR 55 | Ht 68.0 in | Wt 200.0 lb

## 2016-04-04 DIAGNOSIS — N920 Excessive and frequent menstruation with regular cycle: Secondary | ICD-10-CM

## 2016-04-04 DIAGNOSIS — N946 Dysmenorrhea, unspecified: Secondary | ICD-10-CM

## 2016-04-04 DIAGNOSIS — N83201 Unspecified ovarian cyst, right side: Secondary | ICD-10-CM

## 2016-04-04 MED ORDER — NORETHIN-ETH ESTRAD-FE BIPHAS 1 MG-10 MCG / 10 MCG PO TABS
1.0000 | ORAL_TABLET | Freq: Every day | ORAL | 3 refills | Status: DC
Start: 2016-04-04 — End: 2016-11-19

## 2016-04-04 NOTE — Patient Instructions (Signed)
1. Continue low Loestrin oral contraceptive 2. Monitor heavy menses with menstrual calendar 3. Continue with Motrin 800 mg 3 times a day as needed for dysmenorrhea 4. Return in 6 months for follow-up on dysmenorrhea and menorrhagia

## 2016-04-04 NOTE — Progress Notes (Signed)
Chief complaint: 1. Right ovarian cyst follow-up 2. Dysmenorrhea 3. Menorrhagia  Patient presents for follow-up 6 weeks after last visit. She is on oral contraceptives now and has noted a decrease in the heaviness of her bleeding is past month. Pelvic pain has diminished.  Recent ultrasound demonstrated a decrease in size of right ovarian cyst by 50%  Ultrasound: Date of Service:  03/27/16   Indications: Follow Up Right ovarian cyst Findings:  The uterus measures 7.8 x 3.6 x 4.7 cm (this is smaller than prior ultrasound). Echo texture is homogenous without evidence of focal masses.  There is a trace of free fluid in the endometrium. The Endometrium measures 4.1 mm.  Right Ovary is smaller on today's measurements at 3.3 x 2.3 x 2.9 cm. The cyst now measures 2.5 x 1.9 x 2.2 cm which is significantly smaller than prior ( 4.6 x 3.3 x 4.4 cm.). Left Ovary appears WNL and measures 2.7 x 1.5 x 1.8 cm. It appears WNL. Survey of the adnexa demonstrates no adnexal masses. There is no free fluid in the cul de sac.  Impression: 1. Decreasing right ovarian cyst.   Recommendations: 1.Clinical correlation with the patient's History and Physical Exam. 2. Patient has an appointment to discuss results of today's ultrasound with Dr. Greggory Keenefrancesco on 04/04/16.  Revonda Humphreyeresa Elliott, RDMS, RVT Herold HarmsMartin A Jasraj Lappe, MD    OBJECTIVE: BP (!) 141/83   Pulse (!) 55   Ht 5\' 8"  (1.727 m)   Wt 200 lb (90.7 kg)   LMP 03/11/2016   BMI 30.41 kg/m  Pleasant female in no acute distress Abdomen: Soft, nontender without peritoneal signs Pelvic exam: External genitalia-normal Bimanual-mild cervical motion tenderness 1/4; uterus nontender and mid plane; adnexa nonpalpable and nontender  ASSESSMENT: 1. Pelvic pain, resolving 2. Right ovarian cyst 2.5 cm (decreased) 3. Menorrhagia, improved on OCPs 4. Dysmenorrhea, improved on OCPs  PLAN: 1. Continue low Loestrin oral contraceptive 2. Continue with  ibuprofen 800 mg 3 times a day as needed for pain 3. Return in 6 months for follow-up or sooner if pain exacerbation occurs.  A total of 15 minutes were spent face-to-face with the patient during this encounter and over half of that time dealt with counseling and coordination of care.  Herold HarmsMartin A Angelia Hazell, MD  Note: This dictation was prepared with Dragon dictation along with smaller phrase technology. Any transcriptional errors that result from this process are unintentional.

## 2016-05-30 ENCOUNTER — Telehealth: Payer: Self-pay | Admitting: Family Medicine

## 2016-05-30 NOTE — Telephone Encounter (Signed)
I am filling out form for patient Chart reviewed I see where the PPD was placed on 11/20/15 but I do not see the results (perhaps I just cannot locate or I'm not looking in the right place) I tried to call patient, left message ------------------------------------- Andrea MuirJamie, please show me where the results of the TB screening test are for her form; if not recorded, then please call her and we'll suggest rescreening, or if she had it read elsewhere, we need that documentation Thank you

## 2016-05-31 NOTE — Telephone Encounter (Signed)
Patient states will bring the result form up here for the PPD.

## 2016-06-06 NOTE — Telephone Encounter (Signed)
I still don't see it I'll look again

## 2016-06-06 NOTE — Telephone Encounter (Signed)
I do not see the PPD results; please contact patient

## 2016-06-07 NOTE — Telephone Encounter (Signed)
Called regarding TB test. Left a messaged asking patient to come in and get rescreening for the test. I mention in the meessagedthat it would be a blood test and not skin test.

## 2016-06-12 NOTE — Telephone Encounter (Signed)
I'm going to sign off on this note When she comes back in, she is welcome to get a quantiferon gold test for screening

## 2016-07-23 ENCOUNTER — Encounter: Payer: Self-pay | Admitting: Family Medicine

## 2016-07-23 ENCOUNTER — Ambulatory Visit (INDEPENDENT_AMBULATORY_CARE_PROVIDER_SITE_OTHER): Payer: Medicaid Other | Admitting: Family Medicine

## 2016-07-23 VITALS — BP 142/86 | HR 86 | Temp 99.0°F | Resp 14 | Wt 192.0 lb

## 2016-07-23 DIAGNOSIS — R946 Abnormal results of thyroid function studies: Secondary | ICD-10-CM

## 2016-07-23 DIAGNOSIS — R7989 Other specified abnormal findings of blood chemistry: Secondary | ICD-10-CM

## 2016-07-23 DIAGNOSIS — N926 Irregular menstruation, unspecified: Secondary | ICD-10-CM

## 2016-07-23 DIAGNOSIS — M546 Pain in thoracic spine: Secondary | ICD-10-CM | POA: Insufficient documentation

## 2016-07-23 DIAGNOSIS — J301 Allergic rhinitis due to pollen: Secondary | ICD-10-CM

## 2016-07-23 DIAGNOSIS — D709 Neutropenia, unspecified: Secondary | ICD-10-CM | POA: Diagnosis not present

## 2016-07-23 DIAGNOSIS — E7800 Pure hypercholesterolemia, unspecified: Secondary | ICD-10-CM | POA: Diagnosis not present

## 2016-07-23 HISTORY — DX: Neutropenia, unspecified: D70.9

## 2016-07-23 LAB — CBC WITH DIFFERENTIAL/PLATELET
BASOS PCT: 1 %
Basophils Absolute: 44 cells/uL (ref 0–200)
Eosinophils Absolute: 440 cells/uL (ref 15–500)
Eosinophils Relative: 10 %
HCT: 42.4 % (ref 35.0–45.0)
Hemoglobin: 13.8 g/dL (ref 11.7–15.5)
LYMPHS PCT: 43 %
Lymphs Abs: 1892 cells/uL (ref 850–3900)
MCH: 29.8 pg (ref 27.0–33.0)
MCHC: 32.5 g/dL (ref 32.0–36.0)
MCV: 91.6 fL (ref 80.0–100.0)
MONO ABS: 396 {cells}/uL (ref 200–950)
MONOS PCT: 9 %
MPV: 10.8 fL (ref 7.5–12.5)
NEUTROS ABS: 1628 {cells}/uL (ref 1500–7800)
Neutrophils Relative %: 37 %
PLATELETS: 341 10*3/uL (ref 140–400)
RBC: 4.63 MIL/uL (ref 3.80–5.10)
RDW: 14.2 % (ref 11.0–15.0)
WBC: 4.4 10*3/uL (ref 3.8–10.8)

## 2016-07-23 LAB — COMPLETE METABOLIC PANEL WITH GFR
ALK PHOS: 52 U/L (ref 33–115)
ALT: 24 U/L (ref 6–29)
AST: 16 U/L (ref 10–30)
Albumin: 4.2 g/dL (ref 3.6–5.1)
BILIRUBIN TOTAL: 0.5 mg/dL (ref 0.2–1.2)
BUN: 12 mg/dL (ref 7–25)
CO2: 24 mmol/L (ref 20–31)
Calcium: 9.1 mg/dL (ref 8.6–10.2)
Chloride: 106 mmol/L (ref 98–110)
Creat: 0.98 mg/dL (ref 0.50–1.10)
GFR, EST AFRICAN AMERICAN: 82 mL/min (ref >=60)
GFR, EST NON AFRICAN AMERICAN: 71 mL/min (ref >=60)
Glucose, Bld: 73 mg/dL (ref 65–99)
POTASSIUM: 4.4 mmol/L (ref 3.5–5.3)
Sodium: 138 mmol/L (ref 135–146)
TOTAL PROTEIN: 6.9 g/dL (ref 6.1–8.1)

## 2016-07-23 LAB — LIPID PANEL
Cholesterol: 213 mg/dL — ABNORMAL HIGH (ref <200)
HDL: 60 mg/dL (ref >50)
LDL Cholesterol: 136 mg/dL — ABNORMAL HIGH (ref <100)
TRIGLYCERIDES: 83 mg/dL (ref <150)
Total CHOL/HDL Ratio: 3.6 Ratio (ref <5.0)
VLDL: 17 mg/dL (ref <30)

## 2016-07-23 LAB — POCT URINE PREGNANCY: PREG TEST UR: NEGATIVE

## 2016-07-23 LAB — BILIRUBIN, DIRECT: Bilirubin, Direct: 0.1 mg/dL (ref <=0.2)

## 2016-07-23 MED ORDER — FLUTICASONE PROPIONATE 50 MCG/ACT NA SUSP: 2.0000 | Freq: Every day | NASAL | 12 refills | Status: DC

## 2016-07-23 MED ORDER — OLOPATADINE HCL 0.2 % OP SOLN: 1.0000 [drp] | Freq: Every day | OPHTHALMIC | 11 refills | Status: DC

## 2016-07-23 NOTE — Assessment & Plan Note (Signed)
Check TSH 

## 2016-07-23 NOTE — Progress Notes (Signed)
BP (!) 142/86   Pulse 86   Temp 99 F (37.2 C) (Oral)   Resp 14   Wt 192 lb (87.1 kg)   LMP 06/24/2016   SpO2 97%   BMI 29.19 kg/m    Subjective:    Patient ID: Andrea Pope, female    DOB: March 09, 1971, 46 y.o.   MRN: [id number]  HPI: Andrea Pope is a 46 y.o. female  Chief Complaint  Patient presents with  . Back Pain    upper back pain between shoulder blades  . 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 Pain is a sharp pain in between the shoulder blades Same pain as when she had her gallstones Increased her healthy fats, Keto diet; lost ten pounds rather quickly; over 2 weeks time No numbness or weakness going down arms She says again this is the same pain she had with her gallbladder No pain to the touch; she says it feels deeper No wheezes, cough, no respiratory symptoms at all  She also wants a refill of medicine BP really fluctuates; had salty foods yesterday She is 9 days late and wonders how accurate the $1 pregnancy tests are; she has trouble remembering to take her OCPs; thinking about going back on the shot  Allergies; using claritin; would like nasal spray and eye drops; itchy watery eyes Hx of abnormal TSH Reviewed other previous labs with her  [facility] 07/23/2016 01/25/2016 11/29/2015 07/19/2015 05/12/2015  Decreased Interest 0 0 0 0 0  Down, Depressed, Hopeless 0 0 1 0 0  PHQ - 2 Score 0 0 1 0 0   Relevant past medical, surgical, family and social history reviewed Past Medical History:  Diagnosis Date  . Abnormal thyroid stimulating hormone level   . Allergy   . Anemia   . Encounter for initial prescription of injectable contraceptive   . Female stress incontinence   . High cholesterol   . History of ectopic pregnancy 11/29/2015  . Menorrhagia with irregular  cycle   . Ovarian cyst 11/29/2015  . Spider veins of both lower extremities    Past Surgical History:  Procedure Laterality Date  . CHOLECYSTECTOMY N/A 01/19/2016   Procedure: LAPAROSCOPIC CHOLECYSTECTOMY WITH INTRAOPERATIVE CHOLANGIOGRAM;  Surgeon: Kieth BrightlySeeplaputhur G Sankar, MD;  Location: [facility] ORS;  Service: General;  Laterality: N/A;   Family History  Problem Relation Age of Onset  . Hypertension Mother   . Thyroid disease Mother   . Kidney disease Mother   . Hypertension Father   . Cancer Father     Prostate  . Diabetes Brother   . Diabetes Other   . Cancer Paternal Uncle     Prostate  . Diabetes Maternal Grandmother   . Cancer Paternal Grandfather     Prostate  . Diabetes Sister   . Schizophrenia Sister   . Breast cancer Neg Hx   . Ovarian cancer Neg Hx   . Heart disease Neg Hx    Social History  Substance Use Topics  . Smoking status: Never Smoker  . Smokeless tobacco: Never Used  . Alcohol use No   Interim medical history since last visit reviewed. Allergies and medications reviewed  Review of Systems Per HPI unless specifically indicated above     Objective:    BP (!) 142/86  Pulse 86   Temp 99 F (37.2 C) (Oral)   Resp 14   Wt 192 lb (87.1 kg)   LMP 06/24/2016   SpO2 97%   BMI 29.19 kg/m   Wt Readings from Last 3 Encounters:  07/23/16 192 lb (87.1 kg)  04/04/16 200 lb (90.7 kg)  02/21/16 198 lb 4.8 oz (89.9 kg)    Physical Exam  Constitutional: She appears well-developed and well-nourished. No distress.  HENT:  Head: Normocephalic and atraumatic.  Eyes: EOM are normal. No scleral icterus.  Neck: No thyromegaly present.  Cardiovascular: Normal rate, regular rhythm and normal heart sounds.   No murmur heard. Pulmonary/Chest: Effort normal and breath sounds normal. No respiratory distress. She has no wheezes.  Abdominal: Soft. Normal appearance and bowel sounds are normal. She exhibits no distension. There is no hepatosplenomegaly. There is no  tenderness. There is no guarding.  Musculoskeletal: Normal range of motion. She exhibits no edema.       Thoracic back: She exhibits normal range of motion, no tenderness, no bony tenderness, no swelling and no deformity.  Neurological: She is alert. She exhibits normal muscle tone.  Skin: Skin is warm and dry. She is not diaphoretic. No pallor.  Psychiatric: She has a normal mood and affect. Her behavior is normal. Judgment and thought content normal.    Results for orders placed or performed in visit on 07/23/16  POCT urine pregnancy  Result Value Ref Range   Preg Test, Ur Negative Negative      Assessment & Plan:   Problem List Items Addressed This Visit      Respiratory   Allergic rhinitis, seasonal    Refills provided        Other   Neutropenia (HCC)    Check WBC; around pre-K children      Relevant Orders   CBC with Differential/Platelet   Irregular periods/menstrual cycles   Relevant Orders   POCT urine pregnancy (Completed)   High cholesterol    Check lipids      Relevant Orders   Lipid panel   Acute thoracic back pain - Primary    One month duration; may be referred pain; similar to cholecystitis symptoms; reviewed path; order [place] to look at duct dilatation; check labs      Relevant Orders   DG Chest 2 View   COMPLETE METABOLIC PANEL WITH GFR   Bilirubin, Direct   US Abdomen Limited RUQ   Abnormal thyroid stimulating hormone (TSH) level    Check TSH      Relevant Orders   TSH       Follow up plan: No Follow-up on file.  An after-visit summary was printed and given to the patient at check-out.  Please see the patient instructions which may contain other information and recommendations beyond what is mentioned above in the assessment and plan.  Meds ordered this encounter  Medications  . fluticasone (FLONASE) 50 MCG/ACT nasal spray    Sig: Place 2 sprays into both nostrils daily.    Dispense:  16 g    Refill:  12  . Olopatadine HCl 0.2 % SOLN     Sig: Apply 1 drop to eye daily. Each eye    Dispense:  2.5 mL    Refill:  11    Orders Placed This Encounter  Procedures  . DG Chest 2 View  . US Abdomen Limited RUQ  . COMPLETE METABOLIC PANEL WITH GFR  . Bilirubin, Direct  . CBC with Differential/Platelet  .  Lipid panel  . TSH  . POCT urine pregnancy

## 2016-07-23 NOTE — Assessment & Plan Note (Signed)
Refills provided 

## 2016-07-23 NOTE — Assessment & Plan Note (Signed)
Check lipids 

## 2016-07-23 NOTE — Assessment & Plan Note (Signed)
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

## 2016-07-23 NOTE — Assessment & Plan Note (Signed)
One month duration; may be referred pain; similar to cholecystitis symptoms; reviewed path; order [place] to look at duct dilatation; check labs

## 2016-07-23 NOTE — Patient Instructions (Addendum)
Let's get a chest xray today We'll order an ultrasound of the liver and bile ducts Have labs done Go to the ER if worsening If you have not heard anything from my staff in a week about any orders/referrals/studies from today, please contact us here to follow-up [phone] Your goal blood pressure is less than 140 mmHg on top. Try to follow the DASH guidelines (DASH stands for Dietary Approaches to Stop Hypertension) Try to limit the sodium in your diet.  Ideally, consume less than 1.5 grams (less than 1,500mg ) per day. Do not add salt when cooking or at the table.  Check the sodium amount on labels when shopping, and choose items lower in sodium when given a choice. Avoid or limit foods that already contain a lot of sodium. Eat a diet rich in fruits and vegetables and whole grains.

## 2016-07-24 LAB — TSH: TSH: 0.52 mIU/L

## 2016-07-25 ENCOUNTER — Ambulatory Visit: Payer: Self-pay | Admitting: Family Medicine

## 2016-07-31 ENCOUNTER — Ambulatory Visit: Payer: Medicaid Other

## 2016-08-02 ENCOUNTER — Encounter: Payer: Self-pay | Admitting: Family Medicine

## 2016-08-02 ENCOUNTER — Ambulatory Visit
Admission: RE | Admit: 2016-08-02 | Discharge: 2016-08-02 | Disposition: A | Discharge status: Home / Self Care | Payer: Medicaid Other | Source: Ambulatory Visit | Attending: Family Medicine | Admitting: Family Medicine

## 2016-08-02 ENCOUNTER — Ambulatory Visit (INDEPENDENT_AMBULATORY_CARE_PROVIDER_SITE_OTHER): Payer: Medicaid Other | Admitting: Family Medicine

## 2016-08-02 VITALS — BP 138/82 | HR 81 | Temp 99.2°F | Resp 16 | Wt 190.2 lb

## 2016-08-02 DIAGNOSIS — R05 Cough: Secondary | ICD-10-CM | POA: Diagnosis not present

## 2016-08-02 DIAGNOSIS — R059 Cough, unspecified: Secondary | ICD-10-CM

## 2016-08-02 DIAGNOSIS — M546 Pain in thoracic spine: Secondary | ICD-10-CM | POA: Diagnosis present

## 2016-08-02 MED ORDER — BENZONATATE 100 MG PO CAPS: 100.0000 mg | ORAL_CAPSULE | Freq: Three times a day (TID) | ORAL | 0 refills | Status: DC | PRN

## 2016-08-02 MED ORDER — OLOPATADINE HCL 0.2 % OP SOLN: 1.0000 [drp] | Freq: Every day | OPHTHALMIC | 11 refills | Status: DC

## 2016-08-02 MED ORDER — AZITHROMYCIN 250 MG PO TABS: ORAL_TABLET | ORAL | 0 refills | Status: DC

## 2016-08-02 NOTE — Progress Notes (Signed)
BP 138/82   Pulse 81   Temp 99.2 F (37.3 C) (Oral)   Resp 16   Wt 190 lb 3.2 oz (86.3 kg)   LMP 07/26/2016   SpO2 98%   BMI 28.92 kg/m    Subjective:    Patient ID: Andrea Pope, female    DOB: 04-Mar-1971, 46 y.o.   MRN: [id number]  HPI: Andrea Pope is a 46 y.o. female  Chief Complaint  Patient presents with  . URI    mucos, runny noise, sore throat, cough, loss of voice and bady aches   Patient is here for an acute visit Sick since Saturday; hoarse voice Throat was sore and not now Neck pain; chest tightness; cough dry and productive; yellow and thick Nasal congestion from both sides Cough worse at night; no wheezing Ears are okay No rash No travel OTC meds not effective for the cough  Depression screen [facility] 2/9 08/02/2016 07/23/2016 01/25/2016 11/29/2015 07/19/2015  Decreased Interest 0 0 0 0 0  Down, Depressed, Hopeless 0 0 0 1 0  PHQ - 2 Score 0 0 0 1 0   Relevant past medical, surgical, family and social history reviewed Past Medical History:  Diagnosis Date  . Abnormal thyroid stimulating hormone level   . Allergy   . Anemia   . Encounter for initial prescription of injectable contraceptive   . Female stress incontinence   . High cholesterol   . History of ectopic pregnancy 11/29/2015  . Menorrhagia with irregular cycle   . Ovarian cyst 11/29/2015  . Spider veins of both lower extremities    Past Surgical History:  Procedure Laterality Date  . CHOLECYSTECTOMY N/A 01/19/2016   Procedure: LAPAROSCOPIC CHOLECYSTECTOMY WITH INTRAOPERATIVE CHOLANGIOGRAM;  Surgeon: Christene Lye, MD;  Location: [facility] ORS;  Service: General;  Laterality: N/A;   Social History  Substance Use Topics  . Smoking status: Never Smoker  . Smokeless tobacco: Never Used  . Alcohol use No   Interim medical history since last visit reviewed. Allergies and medications reviewed  Review of Systems Per HPI unless specifically indicated above     Objective:    BP  138/82   Pulse 81   Temp 99.2 F (37.3 C) (Oral)   Resp 16   Wt 190 lb 3.2 oz (86.3 kg)   LMP 07/26/2016   SpO2 98%   BMI 28.92 kg/m   Wt Readings from Last 3 Encounters:  08/02/16 190 lb 3.2 oz (86.3 kg)  07/23/16 192 lb (87.1 kg)  04/04/16 200 lb (90.7 kg)    Physical Exam  Constitutional: She appears well-developed and well-nourished. No distress.  HENT:  Head: Normocephalic and atraumatic.  Right Ear: Hearing, tympanic membrane, external ear and ear canal normal. Tympanic membrane is not erythematous. No middle ear effusion.  Left Ear: Hearing, tympanic membrane, external ear and ear canal normal. Tympanic membrane is not erythematous.  No middle ear effusion.  Nose: Rhinorrhea present.  Mouth/Throat: Mucous membranes are normal. Posterior oropharyngeal erythema (mild) present.  Eyes: EOM are normal. No scleral icterus.  Neck: No thyromegaly present.  Cardiovascular: Normal rate, regular rhythm and normal heart sounds.   No murmur heard. Pulmonary/Chest: Effort normal. No respiratory distress. She has no wheezes. She has rhonchi in the right lower field.  Abdominal: Soft. Bowel sounds are normal. She exhibits no distension.  Musculoskeletal: Normal range of motion. She exhibits no edema.  Neurological: She is alert. She exhibits normal muscle tone.  Skin: Skin is warm and  dry. No rash noted. She is not diaphoretic. No pallor.  Psychiatric: She has a normal mood and affect. Her behavior is normal. Judgment and thought content normal.   Results for orders placed or performed in visit on 07/23/16  COMPLETE METABOLIC PANEL WITH GFR  Result Value Ref Range   Sodium 138 135 - 146 mmol/L   Potassium 4.4 3.5 - 5.3 mmol/L   Chloride 106 98 - 110 mmol/L   CO2 24 20 - 31 mmol/L   Glucose, Bld 73 65 - 99 mg/dL   BUN 12 7 - 25 mg/dL   Creat 0.98 0.50 - 1.10 mg/dL   Total Bilirubin 0.5 0.2 - 1.2 mg/dL   Alkaline Phosphatase 52 33 - 115 U/L   AST 16 10 - 30 U/L   ALT 24 6 - 29 U/L    Total Protein 6.9 6.1 - 8.1 g/dL   Albumin 4.2 3.6 - 5.1 g/dL   Calcium 9.1 8.6 - 10.2 mg/dL   GFR, Est African American 82 >=60 mL/min   GFR, Est Non African American 71 >=60 mL/min  Bilirubin, Direct  Result Value Ref Range   Bilirubin, Direct 0.1 <=0.2 mg/dL  CBC with Differential/Platelet  Result Value Ref Range   WBC 4.4 3.8 - 10.8 K/uL   RBC 4.63 3.80 - 5.10 MIL/uL   Hemoglobin 13.8 11.7 - 15.5 g/dL   HCT 42.4 35.0 - 45.0 %   MCV 91.6 80.0 - 100.0 fL   MCH 29.8 27.0 - 33.0 pg   MCHC 32.5 32.0 - 36.0 g/dL   RDW 14.2 11.0 - 15.0 %   Platelets 341 140 - 400 K/uL   MPV 10.8 7.5 - 12.5 fL   Neutro Abs 1,628 1,500 - 7,800 cells/uL   Lymphs Abs 1,892 850 - 3,900 cells/uL   Monocytes Absolute 396 200 - 950 cells/uL   Eosinophils Absolute 440 15 - 500 cells/uL   Basophils Absolute 44 0 - 200 cells/uL   Neutrophils Relative % 37 %   Lymphocytes Relative 43 %   Monocytes Relative 9 %   Eosinophils Relative 10 %   Basophils Relative 1 %   Smear Review Criteria for review not met   Lipid panel  Result Value Ref Range   Cholesterol 213 (H) <200 mg/dL   Triglycerides 83 <150 mg/dL   HDL 60 >50 mg/dL   Total CHOL/HDL Ratio 3.6 <5.0 Ratio   VLDL 17 <30 mg/dL   LDL Cholesterol 136 (H) <100 mg/dL  TSH  Result Value Ref Range   TSH 0.52 mIU/L  POCT urine pregnancy  Result Value Ref Range   Preg Test, Ur Negative Negative      Assessment & Plan:   Problem List Items Addressed This Visit    None    Visit Diagnoses    Cough    -  Primary   with rhonchi RLL; will get CXR; start azithromycin; tessalon perles; call if needed; rest, hydration, C diff precautions       Follow up plan: No Follow-up on file.  An after-visit summary was printed and given to the patient at check-out.  Please see the patient instructions which may contain other information and recommendations beyond what is mentioned above in the assessment and plan.  Meds ordered this encounter  Medications    . azithromycin (ZITHROMAX) 250 MG tablet    Sig: Two pills by mouth today and then one pill daily for four more days    Dispense:  6 tablet      Refill:  0  . benzonatate (TESSALON PERLES) 100 MG capsule    Sig: Take 1 capsule (100 mg total) by mouth every 8 (eight) hours as needed for cough.    Dispense:  30 capsule    Refill:  0  . Olopatadine HCl 0.2 % SOLN    Sig: Apply 1 drop to eye daily. Each eye    Dispense:  2.5 mL    Refill:  11    No orders of the defined types were placed in this encounter.      

## 2016-08-02 NOTE — Patient Instructions (Addendum)
Start the antibiotics Have chest xray today Please do eat yogurt daily or take a probiotic daily for the next month We want to replace the healthy germs in the gut If you notice foul, watery diarrhea in the next two months, schedule an appointment RIGHT AWAY

## 2016-08-05 ENCOUNTER — Encounter: Payer: Self-pay | Admitting: Family Medicine

## 2016-08-05 ENCOUNTER — Ambulatory Visit (INDEPENDENT_AMBULATORY_CARE_PROVIDER_SITE_OTHER): Payer: Medicaid Other | Admitting: Family Medicine

## 2016-08-05 VITALS — BP 140/78 | HR 88 | Temp 98.7°F | Resp 14 | Wt 191.9 lb

## 2016-08-05 DIAGNOSIS — J209 Acute bronchitis, unspecified: Secondary | ICD-10-CM | POA: Diagnosis not present

## 2016-08-05 DIAGNOSIS — M940 Chondrocostal junction syndrome [Tietze]: Secondary | ICD-10-CM | POA: Diagnosis not present

## 2016-08-05 MED ORDER — PREDNISONE 20 MG PO TABS: 40.0000 mg | ORAL_TABLET | Freq: Every day | ORAL | 0 refills | Status: DC

## 2016-08-05 MED ORDER — HYDROCOD POLST-CPM POLST ER 10-8 MG/5ML PO SUER: 5.0000 mL | Freq: Two times a day (BID) | ORAL | 0 refills | Status: DC | PRN

## 2016-08-05 MED ORDER — ALBUTEROL SULFATE HFA 108 (90 BASE) MCG/ACT IN AERS: 2.0000 | INHALATION_SPRAY | RESPIRATORY_TRACT | 1 refills | Status: DC | PRN

## 2016-08-05 NOTE — Progress Notes (Signed)
BP 140/78   Pulse 88   Temp 98.7 F (37.1 C) (Oral)   Resp 14   Wt 191 lb 14.4 oz (87 kg)   LMP 07/26/2016   SpO2 97%   BMI 29.18 kg/m    Subjective:    Patient ID: Andrea Pope, female    DOB: 02-Aug-1970, 46 y.o.   MRN: [id number]  HPI: Andrea Pope is a 46 y.o. female  Chief Complaint  Patient presents with  . Cough  . Emesis   Patient is here, still coughing Feeling some better but the cough is really bad; trying to cough phlegm up; mostly dry; unless after eating, then mixed with food; no rash; no fevers; no travel Tightness in the middle of the chest She was seen on 08/02/16; note reviewed; she was started on tessalon perles, azithromycin, and had a CXR done CHEST  2 VIEW  COMPARISON:  05/02/2013  FINDINGS: Normal heart size. Lungs clear. No pneumothorax. No pleural effusion.  IMPRESSION: No active cardiopulmonary disease.   Electronically Signed   By: Jolaine Click M.D.   On: 08/02/2016 12:35  Depression screen [facility] 2/9 08/02/2016 07/23/2016 01/25/2016 11/29/2015 07/19/2015  Decreased Interest 0 0 0 0 0  Down, Depressed, Hopeless 0 0 0 1 0  PHQ - 2 Score 0 0 0 1 0   Relevant past medical, surgical, family and social history reviewed Past Medical History:  Diagnosis Date  . Abnormal thyroid stimulating hormone level   . Allergy   . Anemia   . Encounter for initial prescription of injectable contraceptive   . Female stress incontinence   . High cholesterol   . History of ectopic pregnancy 11/29/2015  . Menorrhagia with irregular cycle   . Ovarian cyst 11/29/2015  . Spider veins of both lower extremities    Past Surgical History:  Procedure Laterality Date  . CHOLECYSTECTOMY N/A 01/19/2016   Procedure: LAPAROSCOPIC CHOLECYSTECTOMY WITH INTRAOPERATIVE CHOLANGIOGRAM;  Surgeon: Kieth Brightly, MD;  Location: [facility] ORS;  Service: General;  Laterality: N/A;   Social History  Substance Use Topics  . Smoking status: Never Smoker  .  Smokeless tobacco: Never Used  . Alcohol use No   Interim medical history since last visit reviewed. Allergies and medications reviewed  Review of Systems Per HPI unless specifically indicated above     Objective:    BP 140/78   Pulse 88   Temp 98.7 F (37.1 C) (Oral)   Resp 14   Wt 191 lb 14.4 oz (87 kg)   LMP 07/26/2016   SpO2 97%   BMI 29.18 kg/m   Wt Readings from Last 3 Encounters:  08/05/16 191 lb 14.4 oz (87 kg)  08/02/16 190 lb 3.2 oz (86.3 kg)  07/23/16 192 lb (87.1 kg)    Physical Exam  Constitutional: She appears well-developed and well-nourished. No distress.  HENT:  Right Ear: Tympanic membrane and ear canal normal.  Left Ear: Tympanic membrane and ear canal normal.  Nose: Rhinorrhea present.  Mouth/Throat: Oropharynx is clear and moist and mucous membranes are normal.  Cardiovascular: Normal rate and regular rhythm.   Pulmonary/Chest: Effort normal. No respiratory distress. She has wheezes. She has no rales. She exhibits tenderness (over costochondral junction on the right).  Lymphadenopathy:    She has no cervical adenopathy.       Right: No supraclavicular adenopathy present.       Left: No supraclavicular adenopathy present.      Assessment & Plan:   Problem  List Items Addressed This Visit    None    Visit Diagnoses    Acute bronchitis, unspecified organism    -  Primary   start steroids, tussionex; SABA inhaler prescribed; call if needed; reviewed chest xray   Costochondritis, acute       likely from significant, repeated coughing; will treat with steroids and stronger cough medicine       Follow up plan: No Follow-up on file.  An after-visit summary was printed and given to the patient at check-out.  Please see the patient instructions which may contain other information and recommendations beyond what is mentioned above in the assessment and plan.  Meds ordered this encounter  Medications  . chlorpheniramine-HYDROcodone (TUSSIONEX  PENNKINETIC ER) 10-8 MG/5ML SUER    Sig: Take 5 mLs by mouth every 12 (twelve) hours as needed for cough.    Dispense:  115 mL    Refill:  0  . predniSONE (DELTASONE) 20 MG tablet    Sig: Take 2 tablets (40 mg total) by mouth daily with breakfast.    Dispense:  10 tablet    Refill:  0  . albuterol (PROVENTIL HFA;VENTOLIN HFA) 108 (90 Base) MCG/ACT inhaler    Sig: Inhale 2 puffs into the lungs every 4 (four) hours as needed for wheezing or shortness of breath.    Dispense:  1 Inhaler    Refill:  1    No orders of the defined types were placed in this encounter.

## 2016-08-05 NOTE — Patient Instructions (Signed)
Continue the antibiotic Stop the tessalon Use the cough medicine as prescribed if needed Start the prednisone, take with food Call if needed Out of work today and tomorrow, if you need to be out Wednesday just call and we'll extend the note

## 2016-08-06 ENCOUNTER — Other Ambulatory Visit: Payer: Self-pay | Admitting: Family Medicine

## 2016-08-06 MED ORDER — OLOPATADINE HCL 0.1 % OP SOLN: 1.0000 [drp] | Freq: Two times a day (BID) | OPHTHALMIC | 12 refills | Status: DC

## 2016-08-06 NOTE — Progress Notes (Signed)
Changed eye drops per insurance

## 2016-10-02 ENCOUNTER — Encounter: Payer: Medicaid Other | Admitting: Obstetrics and Gynecology

## 2016-10-04 HISTORY — PX: OTHER SURGICAL HISTORY: SHX169

## 2016-10-23 ENCOUNTER — Encounter: Payer: Medicaid Other | Admitting: Obstetrics and Gynecology

## 2016-11-19 ENCOUNTER — Ambulatory Visit (INDEPENDENT_AMBULATORY_CARE_PROVIDER_SITE_OTHER): Payer: BLUE CROSS/BLUE SHIELD | Admitting: Family Medicine

## 2016-11-19 ENCOUNTER — Encounter: Payer: Self-pay | Admitting: Family Medicine

## 2016-11-19 VITALS — BP 112/76 | HR 78 | Temp 98.5°F | Resp 14 | Ht 67.0 in | Wt 188.0 lb

## 2016-11-19 DIAGNOSIS — R5383 Other fatigue: Secondary | ICD-10-CM | POA: Diagnosis not present

## 2016-11-19 DIAGNOSIS — R197 Diarrhea, unspecified: Secondary | ICD-10-CM

## 2016-11-19 DIAGNOSIS — Z8349 Family history of other endocrine, nutritional and metabolic diseases: Secondary | ICD-10-CM | POA: Diagnosis not present

## 2016-11-19 DIAGNOSIS — R140 Abdominal distension (gaseous): Secondary | ICD-10-CM

## 2016-11-19 DIAGNOSIS — E663 Overweight: Secondary | ICD-10-CM | POA: Insufficient documentation

## 2016-11-19 DIAGNOSIS — Z0000 Encounter for general adult medical examination without abnormal findings: Secondary | ICD-10-CM

## 2016-11-19 NOTE — Progress Notes (Signed)
Patient ID: Andrea Pope, female   DOB: [birth year], 46 y.o.   MRN: [id number]   Subjective:   Andrea Pope is a 46 y.o. female here for a complete physical exam  Interim issues since last visit: had retinal laser surgery and going to have other eye done soon  USPSTF grade A and B recommendations Depression:  Depression screen Andrea Pope 2/9 11/19/2016 08/02/2016 07/23/2016 01/25/2016 11/29/2015  Decreased Interest 0 0 0 0 0  Down, Depressed, Hopeless 0 0 0 0 1  PHQ - 2 Score 0 0 0 0 1   Hypertension: BP Readings from Last 3 Encounters:  11/19/16 112/76  08/05/16 140/78  08/02/16 138/82   Obesity: MD got 188 pounds on another scale; 210 pounds on first scale; doubt veracity She walked 30k steps a day at [facility] and still didn't lose weight  Wt Readings from Last 3 Encounters:  11/19/16 188 lb (85.3 kg)  08/05/16 191 lb 14.4 oz (87 kg)  08/02/16 190 lb 3.2 oz (86.3 kg)   BMI Readings from Last 3 Encounters:  11/19/16 29.44 kg/m  08/05/16 29.18 kg/m  08/02/16 28.92 kg/m    Alcohol: occasionally Tobacco use: never smoker HIV, hep B, hep C: through GYN STD testing and prevention (chl/gon/syphilis): through GYN Intimate partner violence: no abuse Breast cancer: through GYN BRCA gene screening: through GYN Cervical cancer screening: through GYN Osteoporosis: n/a Fall prevention/vitamin D: gets outdoors frequently Lipids:  Lab Results  Component Value Date   CHOL 205 (H) 11/19/2016   CHOL 213 (H) 07/23/2016   CHOL 172 12/11/2015   Lab Results  Component Value Date   HDL 60 11/19/2016   HDL 60 07/23/2016   HDL 56 12/11/2015   Lab Results  Component Value Date   LDLCALC 124 (H) 11/19/2016   LDLCALC 136 (H) 07/23/2016   LDLCALC 94 12/11/2015   Lab Results  Component Value Date   TRIG 103 11/19/2016   TRIG 83 07/23/2016   TRIG 108 12/11/2015   Lab Results  Component Value Date   CHOLHDL 3.4 11/19/2016   CHOLHDL 3.6 07/23/2016   CHOLHDL 3.1 12/11/2015    No results found for: LDLDIRECT Glucose:  Glucose, Bld  Date Value Ref Range Status  11/19/2016 100 (H) 65 - 99 mg/dL Final  07/23/2016 73 65 - 99 mg/dL Final  11/29/2015 84 65 - 99 mg/dL Final   Colorectal cancer: start at age 38 Lung cancer:  n/a AAA: n/a Aspirin: n/a Diet:  Servings of calcium, fiber, fruits and veggies Exercise: getting enough steps Skin cancer: nothing worrisome   Past Medical History:  Diagnosis Date  . Abnormal thyroid stimulating hormone level   . Allergy   . Anemia   . Encounter for initial prescription of injectable contraceptive   . Female stress incontinence   . High cholesterol   . History of ectopic pregnancy 11/29/2015  . Menorrhagia with irregular cycle   . Ovarian cyst 11/29/2015  . Spider veins of both lower extremities    Past Surgical History:  Procedure Laterality Date  . CHOLECYSTECTOMY N/A 01/19/2016   Procedure: LAPAROSCOPIC CHOLECYSTECTOMY WITH INTRAOPERATIVE CHOLANGIOGRAM;  Surgeon: Christene Lye, MD;  Location: [facility] ORS;  Service: General;  Laterality: N/A;  . laser surgery on eye Right 10/2016   Family History  Problem Relation Age of Onset  . Hypertension Mother   . Thyroid disease Mother   . Kidney disease Mother   . Hypertension Father   . Cancer Father  Prostate  . Diabetes Brother   . Diabetes Other   . Cancer Paternal Uncle        Prostate  . Diabetes Maternal Grandmother   . Cancer Paternal Grandfather        Prostate  . Diabetes Sister   . Schizophrenia Sister   . Breast cancer Neg Hx   . Ovarian cancer Neg Hx   . Heart disease Neg Hx    Social History  Substance Use Topics  . Smoking status: Never Smoker  . Smokeless tobacco: Never Used  . Alcohol use No   Review of Systems  Constitutional: Positive for fatigue. Negative for unexpected weight change.  Gastrointestinal: Negative for blood in stool.       Bloating, diarrhea  Genitourinary: Negative for hematuria.   Psychiatric/Behavioral: Negative for dysphoric mood.    Objective:   Vitals:   11/19/16 1413  BP: 112/76  Pulse: 78  Resp: 14  Temp: 98.5 F (36.9 C)  TempSrc: Oral  SpO2: 96%  Weight: 188 lb (85.3 kg)  Height: _0  (1.702 m)   Body mass index is 29.44 kg/m. Wt Readings from Last 3 Encounters:  11/19/16 188 lb (85.3 kg)  08/05/16 191 lb 14.4 oz (87 kg)  08/02/16 190 lb 3.2 oz (86.3 kg)   Physical Exam  Constitutional: She appears well-developed and well-nourished.  HENT:  Head: Normocephalic and atraumatic.  Right Ear: Hearing, tympanic membrane, external ear and ear canal normal.  Left Ear: Hearing, tympanic membrane, external ear and ear canal normal.  Eyes: Conjunctivae and EOM are normal. Right eye exhibits no hordeolum. Left eye exhibits no hordeolum. No scleral icterus.  Neck: Carotid bruit is not present. No thyromegaly present.  Cardiovascular: Normal rate, regular rhythm, S1 normal, S2 normal and normal heart sounds.   No extrasystoles are present.  Pulmonary/Chest: Effort normal and breath sounds normal. No respiratory distress.  Abdominal: Soft. Normal appearance and bowel sounds are normal. She exhibits no distension, no abdominal bruit, no pulsatile midline mass and no mass. There is no hepatosplenomegaly. There is no tenderness. No hernia.  Musculoskeletal: Normal range of motion. She exhibits no edema.  Lymphadenopathy:       Head (right side): No submandibular adenopathy present.       Head (left side): No submandibular adenopathy present.    She has no cervical adenopathy.  Neurological: She is alert. She displays no tremor. No cranial nerve deficit. She exhibits normal muscle tone. Gait normal.  Reflex Scores:      Patellar reflexes are 2+ on the right side and 2+ on the left side. Skin: Skin is warm and dry. No bruising and no ecchymosis noted. No cyanosis. No pallor.  Psychiatric: Her speech is normal and behavior is normal. Thought content normal.  Her mood appears not anxious. She does not exhibit a depressed mood.    Assessment/Plan:   Problem List Items Addressed This Visit      Other   Preventative health care    USPSTF grade A and B recommendations reviewed with patient; age-appropriate recommendations, preventive care, screening tests, etc discussed and encouraged; healthy living encouraged; see AVS for patient education given to patient       Relevant Orders   CBC with Differential/Platelet (Completed)   COMPLETE METABOLIC PANEL WITH GFR (Completed)   Lipid panel (Completed)   TSH (Completed)   Overweight (BMI 25.0-29.9)    Will discuss weight loss healthy way and will recheck TSH; offer nutrition for calorie counting and matching  intake vs output      Relevant Orders   Thyroid Peroxidase Antibody (Completed)    Other Visit Diagnoses    Other fatigue    -  Primary   Relevant Orders   Thyroid Peroxidase Antibody (Completed)   Family history of thyroid disorder       Relevant Orders   Thyroid Peroxidase Antibody (Completed)   Abdominal bloating       Relevant Orders   Celiac Disease Comprehensive Panel with Reflexes (Completed)   Diarrhea, unspecified type       Relevant Orders   Celiac Disease Comprehensive Panel with Reflexes (Completed)       No orders of the defined types were placed in this encounter.  Orders Placed This Encounter  Procedures  . CBC with Differential/Platelet  . COMPLETE METABOLIC PANEL WITH GFR  . Lipid panel  . TSH  . Thyroid Peroxidase Antibody  . Celiac Disease Comprehensive Panel with Reflexes    Need tissue transglutaminase for sure in this panel    Follow up plan: Return in about 1 year (around 11/19/2017) for complete physical.  An After Visit Summary was printed and given to the patient.

## 2016-11-19 NOTE — Patient Instructions (Addendum)
 Check out the information at familydoctor.org entitled "Nutrition for Weight Loss: What You Need to Know about Fad Diets" Try to lose between 1-2 pounds per week by taking in fewer calories and burning off more calories You can succeed by limiting portions, limiting foods dense in calories and fat, becoming more active, and drinking 8 glasses of water a day (64 ounces) Don't skip meals, especially breakfast, as skipping meals may alter your metabolism Do not use over-the-counter weight loss pills or gimmicks that claim rapid weight loss A healthy BMI (or body mass index) is between 18.5 and 24.9 You can calculate your ideal BMI at the NIH website http://www.nhlbi.nih.gov/health/educational/lose_wt/BMI/bmicalc.htm  Health Maintenance, Female Adopting a healthy lifestyle and getting preventive care can go a long way to promote health and wellness. Talk with your health care provider about what schedule of regular examinations is right for you. This is a good chance for you to check in with your provider about disease prevention and staying healthy. In between checkups, there are plenty of things you can do on your own. Experts have done a lot of research about which lifestyle changes and preventive measures are most likely to keep you healthy. Ask your health care provider for more information. Weight and diet Eat a healthy diet  Be sure to include plenty of vegetables, fruits, low-fat dairy products, and lean protein.  Do not eat a lot of foods high in solid fats, added sugars, or salt.  Get regular exercise. This is one of the most important things you can do for your health. ? Most adults should exercise for at least 150 minutes each week. The exercise should increase your heart rate and make you sweat (moderate-intensity exercise). ? Most adults should also do strengthening exercises at least twice a week. This is in addition to the moderate-intensity exercise.  Maintain a healthy  weight  Body mass index (BMI) is a measurement that can be used to identify possible weight problems. It estimates body fat based on height and weight. Your health care provider can help determine your BMI and help you achieve or maintain a healthy weight.  For females 20 years of age and older: ? A BMI below 18.5 is considered underweight. ? A BMI of 18.5 to 24.9 is normal. ? A BMI of 25 to 29.9 is considered overweight. ? A BMI of 30 and above is considered obese.  Watch levels of cholesterol and blood lipids  You should start having your blood tested for lipids and cholesterol at 44 years of age, then have this test every 5 years.  You may need to have your cholesterol levels checked more often if: ? Your lipid or cholesterol levels are high. ? You are older than 44 years of age. ? You are at high risk for heart disease.  Cancer screening Lung Cancer  Lung cancer screening is recommended for adults 55-80 years old who are at high risk for lung cancer because of a history of smoking.  A yearly low-dose CT scan of the lungs is recommended for people who: ? Currently smoke. ? Have quit within the past 15 years. ? Have at least a 30-pack-year history of smoking. A pack year is smoking an average of one pack of cigarettes a day for 1 year.  Yearly screening should continue until it has been 15 years since you quit.  Yearly screening should stop if you develop a health problem that would prevent you from having lung cancer treatment.  Breast Cancer    breast self-awareness. This means understanding how your breasts normally appear and feel.  It also means doing regular breast self-exams. Let your health care provider know about any changes, no matter how small.  If you are in your 20s or 30s, you should have a clinical breast exam (CBE) by a health care provider every 1-3 years as part of a regular health exam.  If you are 1 or older, have a CBE every year. Also consider  having a breast X-ray (mammogram) every year.  If you have a family history of breast cancer, talk to your health care provider about genetic screening.  If you are at high risk for breast cancer, talk to your health care provider about having an MRI and a mammogram every year.  Breast cancer gene (BRCA) assessment is recommended for women who have family members with BRCA-related cancers. BRCA-related cancers include: ? Breast. ? Ovarian. ? Tubal. ? Peritoneal cancers.  Results of the assessment will determine the need for genetic counseling and BRCA1 and BRCA2 testing.  Cervical Cancer Your health care provider may recommend that you be screened regularly for cancer of the pelvic organs (ovaries, uterus, and vagina). This screening involves a pelvic examination, including checking for microscopic changes to the surface of your cervix (Pap test). You may be encouraged to have this screening done every 3 years, beginning at age 28.  For women ages 44-65, health care providers may recommend pelvic exams and Pap testing every 3 years, or they may recommend the Pap and pelvic exam, combined with testing for human papilloma virus (HPV), every 5 years. Some types of HPV increase your risk of cervical cancer. Testing for HPV may also be done on women of any age with unclear Pap test results.  Other health care providers may not recommend any screening for nonpregnant women who are considered low risk for pelvic cancer and who do not have symptoms. Ask your health care provider if a screening pelvic exam is right for you.  If you have had past treatment for cervical cancer or a condition that could lead to cancer, you need Pap tests and screening for cancer for at least 20 years after your treatment. If Pap tests have been discontinued, your risk factors (such as having a new sexual partner) need to be reassessed to determine if screening should resume. Some women have medical problems that increase  the chance of getting cervical cancer. In these cases, your health care provider may recommend more frequent screening and Pap tests.  Colorectal Cancer  This type of cancer can be detected and often prevented.  Routine colorectal cancer screening usually begins at 44 years of age and continues through 44 years of age.  Your health care provider may recommend screening at an earlier age if you have risk factors for colon cancer.  Your health care provider may also recommend using home test kits to check for hidden blood in the stool.  A small camera at the end of a tube can be used to examine your colon directly (sigmoidoscopy or colonoscopy). This is done to check for the earliest forms of colorectal cancer.  Routine screening usually begins at age 52.  Direct examination of the colon should be repeated every 5-10 years through 44 years of age. However, you may need to be screened more often if early forms of precancerous polyps or small growths are found.  Skin Cancer  Check your skin from head to toe regularly.  Tell your health care provider  provider about any new moles or changes in moles, especially if there is a change in a mole's shape or color.  Also tell your health care provider if you have a mole that is larger than the size of a pencil eraser.  Always use sunscreen. Apply sunscreen liberally and repeatedly throughout the day.  Protect yourself by wearing long sleeves, pants, a wide-brimmed hat, and sunglasses whenever you are outside.  Heart disease, diabetes, and high blood pressure  High blood pressure causes heart disease and increases the risk of stroke. High blood pressure is more likely to develop in: ? People who have blood pressure in the high end of the normal range (130-139/85-89 mm Hg). ? People who are overweight or obese. ? People who are African American.  If you are 18-39 years of age, have your blood pressure checked every 3-5 years. If you are 40 years of age  or older, have your blood pressure checked every year. You should have your blood pressure measured twice-once when you are at a hospital or clinic, and once when you are not at a hospital or clinic. Record the average of the two measurements. To check your blood pressure when you are not at a hospital or clinic, you can use: ? An automated blood pressure machine at a pharmacy. ? A home blood pressure monitor.  If you are between 55 years and 79 years old, ask your health care provider if you should take aspirin to prevent strokes.  Have regular diabetes screenings. This involves taking a blood sample to check your fasting blood sugar level. ? If you are at a normal weight and have a low risk for diabetes, have this test once every three years after 45 years of age. ? If you are overweight and have a high risk for diabetes, consider being tested at a younger age or more often. Preventing infection Hepatitis B  If you have a higher risk for hepatitis B, you should be screened for this virus. You are considered at high risk for hepatitis B if: ? You were born in a country where hepatitis B is common. Ask your health care provider which countries are considered high risk. ? Your parents were born in a high-risk country, and you have not been immunized against hepatitis B (hepatitis B vaccine). ? You have HIV or AIDS. ? You use needles to inject street drugs. ? You live with someone who has hepatitis B. ? You have had sex with someone who has hepatitis B. ? You get hemodialysis treatment. ? You take certain medicines for conditions, including cancer, organ transplantation, and autoimmune conditions.  Hepatitis C  Blood testing is recommended for: ? Everyone born from 1945 through 1965. ? Anyone with known risk factors for hepatitis C.  Sexually transmitted infections (STIs)  You should be screened for sexually transmitted infections (STIs) including gonorrhea and chlamydia if: ? You are  sexually active and are younger than 44 years of age. ? You are older than 44 years of age and your health care provider tells you that you are at risk for this type of infection. ? Your sexual activity has changed since you were last screened and you are at an increased risk for chlamydia or gonorrhea. Ask your health care provider if you are at risk.  If you do not have HIV, but are at risk, it may be recommended that you take a prescription medicine daily to prevent HIV infection. This is called pre-exposure prophylaxis (PrEP). You   considered at risk if: ? You are sexually active and do not regularly use condoms or know the HIV status of your partner(s). ? You take drugs by injection. ? You are sexually active with a partner who has HIV.  Talk with your health care provider about whether you are at high risk of being infected with HIV. If you choose to begin PrEP, you should first be tested for HIV. You should then be tested every 3 months for as long as you are taking PrEP. Pregnancy  If you are premenopausal and you may become pregnant, ask your health care provider about preconception counseling.  If you may become pregnant, take 400 to 800 micrograms (mcg) of folic acid every day.  If you want to prevent pregnancy, talk to your health care provider about birth control (contraception). Osteoporosis and menopause  Osteoporosis is a disease in which the bones lose minerals and strength with aging. This can result in serious bone fractures. Your risk for osteoporosis can be identified using a bone density scan.  If you are 20 years of age or older, or if you are at risk for osteoporosis and fractures, ask your health care provider if you should be screened.  Ask your health care provider whether you should take a calcium or vitamin D supplement to lower your risk for osteoporosis.  Menopause may have certain physical symptoms and risks.  Hormone replacement therapy may reduce some  of these symptoms and risks. Talk to your health care provider about whether hormone replacement therapy is right for you. Follow these instructions at home:  Schedule regular health, dental, and eye exams.  Stay current with your immunizations.  Do not use any tobacco products including cigarettes, chewing tobacco, or electronic cigarettes.  If you are pregnant, do not drink alcohol.  If you are breastfeeding, limit how much and how often you drink alcohol.  Limit alcohol intake to no more than 1 drink per day for nonpregnant women. One drink equals 12 ounces of beer, 5 ounces of wine, or 1 ounces of hard liquor.  Do not use street drugs.  Do not share needles.  Ask your health care provider for help if you need support or information about quitting drugs.  Tell your health care provider if you often feel depressed.  Tell your health care provider if you have ever been abused or do not feel safe at home. This information is not intended to replace advice given to you by your health care provider. Make sure you discuss any questions you have with your health care provider. Document Released: 11/05/2010 Document Revised: 09/28/2015 Document Reviewed: 01/24/2015 Elsevier Interactive Patient Education  2018 [employer].  Preventing Unhealthy [employer], Adult Staying at a healthy weight is important. When fat builds up in your body, you may become overweight or obese. These conditions put you at greater risk for developing certain health problems, such as heart disease, diabetes, sleeping problems, joint problems, and some cancers. Unhealthy weight gain is often the result of making unhealthy choices in what you eat. It is also a result of not getting enough exercise. You can make changes to your lifestyle to prevent obesity and stay as healthy as possible. What nutrition changes can be made? To maintain a healthy weight and prevent obesity:  Eat only as much as your body needs.  To do this: ? Pay attention to signs that you are hungry or full. Stop eating as soon as you feel full. ? If you feel  feel hungry, try drinking water first. Drink enough water so your urine is clear or pale yellow. ? Eat smaller portions. ? Look at serving sizes on food labels. Most foods contain more than one serving per container. ? Eat the recommended amount of calories for your gender and activity level. While most active people should eat around 2,000 calories per day, if you are trying to lose weight or are not very active, you main need to eat less calories. Talk to your health care provider or dietitian about how many calories you should eat each day.  Choose healthy foods, such as: ? Fruits and vegetables. Try to fill at least half of your plate at each meal with fruits and vegetables. ? Whole grains, such as whole wheat bread, brown rice, and quinoa. ? Lean meats, such as chicken or fish. ? Other healthy proteins, such as beans, eggs, or tofu. ? Healthy fats, such as nuts, seeds, fatty fish, and olive oil. ? Low-fat or fat-free dairy.  Check food labels and avoid food and drinks that: ? Are high in calories. ? Have added sugar. ? Are high in sodium. ? Have saturated fats or trans fats.  Limit how much you eat of the following foods: ? Prepackaged meals. ? Fast food. ? Fried foods. ? Processed meat, such as bacon, sausage, and deli meats. ? Fatty cuts of red meat and poultry with skin.  Cook foods in healthier ways, such as by baking, broiling, or grilling.  When grocery shopping, try to shop around the outside of the store. This helps you buy mostly fresh foods and avoid canned and prepackaged foods.  What lifestyle changes can be made?  Exercise at least 30 minutes 5 or more days each week. Exercising includes brisk walking, yard work, biking, running, swimming, and team sports like basketball and soccer. Ask your health care provider which exercises are safe for you.  Do  not use any products that contain nicotine or tobacco, such as cigarettes and e-cigarettes. If you need help quitting, ask your health care provider.  Limit alcohol intake to no more than 1 drink a day for nonpregnant women and 2 drinks a day for men. One drink equals 12 oz of beer, 5 oz of wine, or 1 oz of hard liquor.  Try to get 7-9 hours of sleep each night. What other changes can be made?  Keep a food and activity journal to keep track of: ? What you ate and how many calories you had. Remember to count sauces, dressings, and side dishes. ? Whether you were active, and what exercises you did. ? Your calorie, weight, and activity goals.  Check your weight regularly. Track any changes. If you notice you have gained weight, make changes to your diet or activity routine.  Avoid taking weight-loss medicines or supplements. Talk to your health care provider before starting any new medicine or supplement.  Talk to your health care provider before trying any new diet or exercise plan. Why are these changes important? Eating healthy, staying active, and having healthy habits not only help prevent obesity, they also:  Help you to manage stress and emotions.  Help you to connect with friends and family.  Improve your self-esteem.  Improve your sleep.  Prevent long-term health problems.  What can happen if changes are not made? Being obese or overweight can cause you to develop joint or bone problems, which can make it hard for you to stay active or do activities you enjoy. Being   or overweight also puts stress on your heart and lungs and can lead to health problems like diabetes, heart disease, and some cancers. Where to find more information: Talk with your health care provider or a dietitian about healthy eating and healthy lifestyle choices. You may also find other information through these resources:  [place]. Department of Agriculture MyPlate: FormerBoss.no  American  Heart Association: www.heart.org  Centers for Disease Control and Prevention: http://www.wolf.info/  Summary  Staying at a healthy weight is important. It helps prevent certain diseases and health problems, such as heart disease, diabetes, joint problems, sleep disorders, and some cancers.  Being obese or overweight can cause you to develop joint or bone problems, which can make it hard for you to stay active or do activities you enjoy.  You can prevent unhealthy weight gain by eating a healthy diet, exercising regularly, not smoking, limiting alcohol, and getting enough sleep.  Talk with your health care provider or a dietitian for guidance about healthy eating and healthy lifestyle choices. This information is not intended to replace advice given to you by your health care provider. Make sure you discuss any questions you have with your health care provider. Document Released: 04/23/2016 Document Revised: 05/29/2016 Document Reviewed: 05/29/2016 Elsevier Interactive Patient Education  [employer].

## 2016-11-19 NOTE — Assessment & Plan Note (Addendum)
Will discuss weight loss healthy way and will recheck TSH; offer nutrition for calorie counting and matching intake vs output

## 2016-11-19 NOTE — Assessment & Plan Note (Signed)
USPSTF grade A and B recommendations reviewed with patient; age-appropriate recommendations, preventive care, screening tests, etc discussed and encouraged; healthy living encouraged; see AVS for patient education given to patient  

## 2016-11-20 LAB — COMPLETE METABOLIC PANEL WITH GFR
ALBUMIN: 4 g/dL (ref 3.6–5.1)
ALK PHOS: 66 U/L (ref 33–115)
ALT: 16 U/L (ref 6–29)
AST: 15 U/L (ref 10–30)
BUN: 13 mg/dL (ref 7–25)
CALCIUM: 9 mg/dL (ref 8.6–10.2)
CHLORIDE: 105 mmol/L (ref 98–110)
CO2: 22 mmol/L (ref 20–31)
CREATININE: 0.92 mg/dL (ref 0.50–1.10)
GFR, EST AFRICAN AMERICAN: 88 mL/min (ref >=60)
GFR, Est Non African American: 76 mL/min (ref >=60)
Glucose, Bld: 100 mg/dL — ABNORMAL HIGH (ref 65–99)
POTASSIUM: 3.6 mmol/L (ref 3.5–5.3)
Sodium: 139 mmol/L (ref 135–146)
Total Bilirubin: 0.3 mg/dL (ref 0.2–1.2)
Total Protein: 6.5 g/dL (ref 6.1–8.1)

## 2016-11-20 LAB — TSH: TSH: 0.44 mIU/L

## 2016-11-20 LAB — LIPID PANEL
CHOLESTEROL: 205 mg/dL — AB (ref <200)
HDL: 60 mg/dL (ref >50)
LDL Cholesterol: 124 mg/dL — ABNORMAL HIGH (ref <100)
TRIGLYCERIDES: 103 mg/dL (ref <150)
Total CHOL/HDL Ratio: 3.4 Ratio (ref <5.0)
VLDL: 21 mg/dL (ref <30)

## 2016-11-20 LAB — CBC WITH DIFFERENTIAL/PLATELET
Basophils Absolute: 0 cells/uL (ref 0–200)
Basophils Relative: 0 %
EOS ABS: 360 {cells}/uL (ref 15–500)
Eosinophils Relative: 8 %
HEMATOCRIT: 40 % (ref 35.0–45.0)
HEMOGLOBIN: 12.9 g/dL (ref 11.7–15.5)
LYMPHS ABS: 1935 {cells}/uL (ref 850–3900)
LYMPHS PCT: 43 %
MCH: 29.9 pg (ref 27.0–33.0)
MCHC: 32.3 g/dL (ref 32.0–36.0)
MCV: 92.6 fL (ref 80.0–100.0)
MONO ABS: 360 {cells}/uL (ref 200–950)
MPV: 10.4 fL (ref 7.5–12.5)
Monocytes Relative: 8 %
NEUTROS PCT: 41 %
Neutro Abs: 1845 cells/uL (ref 1500–7800)
Platelets: 330 10*3/uL (ref 140–400)
RBC: 4.32 MIL/uL (ref 3.80–5.10)
RDW: 13.7 % (ref 11.0–15.0)
WBC: 4.5 10*3/uL (ref 3.8–10.8)

## 2016-11-20 LAB — CELIAC DISEASE COMPREHENSIVE PANEL WITH REFLEXES
IgA: 170 mg/dL (ref 81–463)
Tissue Transglutaminase Ab, IgA: 1 U/mL (ref <4)

## 2016-11-21 LAB — THYROID PEROXIDASE ANTIBODY: Thyroperoxidase Ab SerPl-aCnc: 4 IU/mL (ref <9)

## 2016-11-27 ENCOUNTER — Other Ambulatory Visit: Payer: Self-pay | Admitting: Family Medicine

## 2016-11-27 DIAGNOSIS — Z1231 Encounter for screening mammogram for malignant neoplasm of breast: Secondary | ICD-10-CM

## 2016-11-28 ENCOUNTER — Encounter: Payer: Self-pay | Admitting: Family Medicine

## 2016-12-04 ENCOUNTER — Inpatient Hospital Stay: Admission: RE | Admit: 2016-12-04 | Payer: Medicaid Other | Source: Ambulatory Visit

## 2017-01-28 ENCOUNTER — Encounter: Payer: Self-pay | Admitting: Obstetrics and Gynecology

## 2017-01-28 ENCOUNTER — Ambulatory Visit (INDEPENDENT_AMBULATORY_CARE_PROVIDER_SITE_OTHER): Payer: BLUE CROSS/BLUE SHIELD | Admitting: Obstetrics and Gynecology

## 2017-01-28 VITALS — BP 152/95 | HR 76 | Ht 67.0 in | Wt 185.7 lb

## 2017-01-28 DIAGNOSIS — N920 Excessive and frequent menstruation with regular cycle: Secondary | ICD-10-CM

## 2017-01-28 DIAGNOSIS — N946 Dysmenorrhea, unspecified: Secondary | ICD-10-CM

## 2017-01-28 LAB — POCT URINE PREGNANCY: PREG TEST UR: NEGATIVE

## 2017-01-28 NOTE — Addendum Note (Signed)
Addended by: Marchelle Folks on: 01/28/2017 02:35 PM   Modules accepted: Orders

## 2017-01-28 NOTE — Progress Notes (Signed)
Chief complaint: 1. Menorrhagia 2. Dysmenorrhea  Patient presents for evaluation of menorrhagia. Bleeding has been heavy with clots and lasting upwards of 14 days. She did have an endometrial biopsy done 1 year ago which was normal. The patient took Lo Loestrin oral contraceptive for 2 months but then discontinued the medication.  Past medical history, past surgical history, problem list, medications, and allergies are reviewed  OBJECTIVE: BP (!) 152/95   Pulse 76   Ht  (1.702 m)   Wt 185 lb 11.2 oz (84.2 kg)   LMP 01/10/2017 (Exact Date)   BMI 29.08 kg/m   UPT-negative  Pleasant well-appearing female in no acute distress. Alert and oriented. Abdomen: Soft, nontender without organomegaly Pelvic exam: External genitalia normal BUS-normal Vagina-normal estrogen effect; no lesions Cervix-parous; no cervical motion tenderness Uterus-midplane, top normal size, mobile, nontender Adnexa-nonpalpable nontender Rectovaginal-normal external exam  PROCEDURE: Endometrial biopsy Endometrial Biopsy Procedure Note  Pre-operative Diagnosis: Menorrhagia  Post-operative Diagnosis: same  Procedure Details   Urine pregnancy test was done  and result was negative.  The risks (including infection, bleeding, pain, and uterine perforation) and benefits of the procedure were explained to the patient and Verbal informed consent was obtained.  Antibiotic prophylaxis against endocarditis was not indicated.   The patient was placed in the dorsal lithotomy position.  Bimanual exam showed the uterus to be in the neutral position.  A Graves' speculum inserted in the vagina.  Endocervical curettage with a Kevorkian curette was not performed.   A sharp tenaculum was not applied to the anterior lip of the cervix for stabilization.  A sterile uterine sound was used to sound the uterus to a depth of 8cm.  A Mylex 3mm curette was used to sample the endometrium.  Sample was sent for pathologic  examination.  Condition: Stable  Complications: None  Plan:  The patient was advised to call for any fever or for prolonged or severe pain or bleeding. She was advised to use OTC acetaminophen and OTC ibuprofen as needed for mild to moderate pain. She was advised to avoid vaginal intercourse for 48 hours or until the bleeding has completely stopped.  Attending Physician Documentation: Herold Harms, MD  ASSESSMENT: 1. Menorrhagia with regular cycle 2. Elevated blood pressure without diagnosis of hypertension 3. Fatigue  PLAN: 1. Endometrial biopsy as noted 2. CBC 3. Recommend multivitamin daily 4. Pelvic ultrasound 5. Return in 2 weeks for follow-up and further management planning 6. Literature on endometrial ablation is given  A total of 15 minutes were spent face-to-face with the patient during this encounter and over half of that time dealt with counseling and coordination of care.  Herold Harms, MD  Note: This dictation was prepared with Dragon dictation along with smaller phrase technology. Any transcriptional errors that result from this process are unintentional.

## 2017-01-28 NOTE — Patient Instructions (Signed)
1. Endometrial biopsy is done today 2. CBC is obtained today 3. Ultrasound is scheduled today 4. Return in 2 weeks for follow-up on studies and further management planning 5. Literature regarding NovaSure endometrial ablation is given.   Endometrial Ablation Endometrial ablation is a procedure that destroys the thin inner layer of the lining of the uterus (endometrium). This procedure may be done:  To stop heavy periods.  To stop bleeding that is causing anemia.  To control irregular bleeding.  To treat bleeding caused by small tumors (fibroids) in the endometrium.  This procedure is often an alternative to major surgery, such as removal of the uterus and cervix (hysterectomy). As a result of this procedure:  You may not be able to have children. However, if you are premenopausal (you have not gone through menopause): ? You may still have a small chance of getting pregnant. ? You will need to use a reliable method of birth control after the procedure to prevent pregnancy.  You may stop having a menstrual period, or you may have only a small amount of bleeding during your period. Menstruation may return several years after the procedure.  Tell a health care provider about:  Any allergies you have.  All medicines you are taking, including vitamins, herbs, eye drops, creams, and over-the-counter medicines.  Any problems you or family members have had with the use of anesthetic medicines.  Any blood disorders you have.  Any surgeries you have had.  Any medical conditions you have. What are the risks? Generally, this is a safe procedure. However, problems may occur, including:  A hole (perforation) in the uterus or bowel.  Infection of the uterus, bladder, or vagina.  Bleeding.  Damage to other structures or organs.  An air bubble in the lung (air embolus).  Problems with pregnancy after the procedure.  Failure of the procedure.  Decreased ability to diagnose cancer  in the endometrium.  What happens before the procedure?  You will have tests of your endometrium to make sure there are no pre-cancerous cells or cancer cells present.  You may have an ultrasound of the uterus.  You may be given medicines to thin the endometrium.  Ask your health care provider about: ? Changing or stopping your regular medicines. This is especially important if you take diabetes medicines or blood thinners. ? Taking medicines such as aspirin and ibuprofen. These medicines can thin your blood. Do not take these medicines before your procedure if your doctor tells you not to.  Plan to have someone take you home from the hospital or clinic. What happens during the procedure?  You will lie on an exam table with your feet and legs supported as in a pelvic exam.  To lower your risk of infection: ? Your health care team will wash or sanitize their hands and put on germ-free (sterile) gloves. ? Your genital area will be washed with soap.  An IV tube will be inserted into one of your veins.  You will be given a medicine to help you relax (sedative).  A surgical instrument with a light and camera (resectoscope) will be inserted into your vagina and moved into your uterus. This allows your surgeon to see inside your uterus.  Endometrial tissue will be removed using one of the following methods: ? Radiofrequency. This method uses a radiofrequency-alternating electric current to remove the endometrium. ? Cryotherapy. This method uses extreme cold to freeze the endometrium. ? Heated-free liquid. This method uses a heated saltwater (saline) solution  to remove the endometrium. ? Microwave. This method uses high-energy microwaves to heat up the endometrium and remove it. ? Thermal balloon. This method involves inserting a catheter with a balloon tip into the uterus. The balloon tip is filled with heated fluid to remove the endometrium. The procedure may vary among health care  providers and hospitals. What happens after the procedure?  Your blood pressure, heart rate, breathing rate, and blood oxygen level will be monitored until the medicines you were given have worn off.  As tissue healing occurs, you may notice vaginal bleeding for 4-6 weeks after the procedure. You may also experience: ? Cramps. ? Thin, watery vaginal discharge that is light pink or brown in color. ? A need to urinate more frequently than usual. ? Nausea.  Do not drive for 24 hours if you were given a sedative.  Do not have sex or insert anything into your vagina until your health care provider approves. Summary  Endometrial ablation is done to treat the many causes of heavy menstrual bleeding.  The procedure may be done only after medications have been tried to control the bleeding.  Plan to have someone take you home from the hospital or clinic. This information is not intended to replace advice given to you by your health care provider. Make sure you discuss any questions you have with your health care provider. Document Released: 03/01/2004 Document Revised: 05/09/2016 Document Reviewed: 05/09/2016 Elsevier Interactive Patient Education  2017 [employer].

## 2017-01-29 LAB — CBC
HEMATOCRIT: 37.8 % (ref 34.0–46.6)
HEMOGLOBIN: 12.2 g/dL (ref 11.1–15.9)
MCH: 29.5 pg (ref 26.6–33.0)
MCHC: 32.3 g/dL (ref 31.5–35.7)
MCV: 91 fL (ref 79–97)
Platelets: 321 10*3/uL (ref 150–379)
RBC: 4.14 x10E6/uL (ref 3.77–5.28)
RDW: 13.7 % (ref 12.3–15.4)
WBC: 5 10*3/uL (ref 3.4–10.8)

## 2017-01-30 ENCOUNTER — Ambulatory Visit (INDEPENDENT_AMBULATORY_CARE_PROVIDER_SITE_OTHER): Payer: BLUE CROSS/BLUE SHIELD

## 2017-01-30 ENCOUNTER — Encounter: Payer: Self-pay | Admitting: Obstetrics and Gynecology

## 2017-01-30 DIAGNOSIS — N920 Excessive and frequent menstruation with regular cycle: Secondary | ICD-10-CM

## 2017-01-30 LAB — PATHOLOGY

## 2017-02-11 ENCOUNTER — Encounter: Payer: BLUE CROSS/BLUE SHIELD | Admitting: Obstetrics and Gynecology

## 2017-02-13 ENCOUNTER — Encounter: Payer: BLUE CROSS/BLUE SHIELD | Admitting: Obstetrics and Gynecology

## 2017-02-13 ENCOUNTER — Encounter: Payer: Self-pay | Admitting: Obstetrics and Gynecology

## 2017-02-13 ENCOUNTER — Ambulatory Visit (INDEPENDENT_AMBULATORY_CARE_PROVIDER_SITE_OTHER): Payer: BLUE CROSS/BLUE SHIELD | Admitting: Obstetrics and Gynecology

## 2017-02-13 VITALS — BP 131/67 | HR 68 | Ht 67.0 in | Wt 191.9 lb

## 2017-02-13 DIAGNOSIS — N920 Excessive and frequent menstruation with regular cycle: Secondary | ICD-10-CM

## 2017-02-13 NOTE — Progress Notes (Signed)
Chief complaint: 1. Menorrhagia  Patient has not had any bleeding since after 01/25/2017. She is not currently expressing any pelvic pain.  Pathology from endometrial biopsy on 01/28/2017: Diagnosis:  ENDOMETRIUM, BIOPSY:  PROLIFERATIVE ENDOMETRIUM. NO HYPERPLASIA OR CARCINOMA.  ACA/01/30/2017   Pelvic ultrasound results from 01/31/2017: ULTRASOUND REPORT  Location: [facility] Date of Service: 01/30/17   Indications: Menorrhagia and history of ovarian cysts Findings:  The uterus is anteverted and measures 9.5 x 4.5 x 5.2 cm. Echo texture is diffusely homogenous without evidence of focal masses.   The Endometrium appears WNL and measures 7.8 mm.  Right Ovary measures 2.6 x 2.5 x 2.4 cm, and appears WNL. Left Ovary measures 2.5 x 1.4 x 2.6 cm. Dominant follicle is seen, otherwise, appears WNL. Survey of the adnexa demonstrates no adnexal masses. There is no free fluid in the cul de sac.  Impression: 1. Normal appearing pelvic ultrasound.  Recommendations: 1.Clinical correlation with the patient's History and Physical Exam.  Revonda Humphrey, RDMS, RVT Herold Harms, MD  OBJECTIVE: BP 131/67   Pulse 68   Ht  (1.702 m)   Wt 191 lb 14.4 oz (87 kg)   LMP 01/10/2017 (Exact Date)   BMI 30.06 kg/m  Physical exam-deferred   ASSESSMENT: 1. Menorrhagia 2. Endometrial biopsy abdomen benign 3. Normal pelvic ultrasound 4. Good candidate for endometrial ablation  PLAN: 1. Hysteroscopy/D&C with NovaSure endometrial ablation is to be scheduled within the next several weeks 2. Return the week before surgery for preoperative appointment 3. Preoperative progestin management was discussed but not initiated at this time.  A total of 15 minutes were spent face-to-face with the patient during this encounter and over half of that time dealt with counseling and coordination of care.  Herold Harms, MD  Note: This dictation was prepared with  Dragon dictation along with smaller phrase technology. Any transcriptional errors that result from this process are unintentional.

## 2017-02-13 NOTE — Patient Instructions (Signed)
1. Hysteroscopy/D&C with NovaSure endometrial ablation is to be scheduled within the next couple weeks 2. Return the week before surgery for preoperative appointment

## 2017-03-14 ENCOUNTER — Ambulatory Visit (INDEPENDENT_AMBULATORY_CARE_PROVIDER_SITE_OTHER): Payer: BLUE CROSS/BLUE SHIELD | Admitting: Family Medicine

## 2017-03-14 ENCOUNTER — Ambulatory Visit: Payer: Self-pay | Admitting: *Deleted

## 2017-03-14 ENCOUNTER — Telehealth: Payer: Self-pay

## 2017-03-14 ENCOUNTER — Encounter: Payer: Self-pay | Admitting: Family Medicine

## 2017-03-14 VITALS — BP 136/68 | HR 96 | Temp 100.4°F | Resp 16 | Wt 187.1 lb

## 2017-03-14 DIAGNOSIS — J029 Acute pharyngitis, unspecified: Secondary | ICD-10-CM | POA: Diagnosis not present

## 2017-03-14 DIAGNOSIS — J020 Streptococcal pharyngitis: Secondary | ICD-10-CM

## 2017-03-14 LAB — POCT RAPID STREP A (OFFICE): Rapid Strep A Screen: POSITIVE — AB

## 2017-03-14 MED ORDER — FIRST-DUKES MOUTHWASH MT SUSP: 15.0000 mL | Freq: Three times a day (TID) | OROMUCOSAL | 0 refills | Status: DC

## 2017-03-14 MED ORDER — AMOXICILLIN 500 MG PO CAPS: 500.0000 mg | ORAL_CAPSULE | Freq: Three times a day (TID) | ORAL | 0 refills | Status: DC

## 2017-03-14 NOTE — Progress Notes (Signed)
Name: Andrea Pope   MRN: [id number]    DOB: Apr 01, 1971   Date:03/14/2017       Progress Note  Subjective  Chief Complaint  Chief Complaint  Patient presents with  . Sore Throat    mild; discomfort symptoms started on wenesday   . Fever    100.4 today; pt took the fever and she states its was 99.6 today is the highest   . Generalized Body Aches    and chills; symptoms started this wenesday   . Cough    Chest pain from couhing so much   . 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 She has been sleeping more than usual and decrease in appetite. Denies rashes, nausea or vomiting. She went to work yesterday but took today off. Lots of children have been sick at the daycare but not sure of the cause of illness.   Patient Active Problem List   Diagnosis Date Noted  . Overweight (BMI 25.0-29.9) 11/19/2016  . Acute thoracic back pain 07/23/2016  . Neutropenia (HCC) 07/23/2016  . White coat hypertension 01/25/2016  . Dyspareunia, female 01/03/2016  . Dysmenorrhea 01/03/2016  . Menorrhagia with regular cycle 01/03/2016  . High cholesterol 01/01/2016  . Preventative health care 12/08/2015  . Gallstones 12/05/2015  . Irregular periods/menstrual cycles 11/29/2015  . Abdominal pain 11/29/2015  . Ovarian cyst 11/29/2015  . History of ectopic pregnancy 11/29/2015  . Breast cancer screening 09/21/2015  . Proteinuria 08/14/2015  . Right knee sprain 07/19/2015  . Abnormal thyroid stimulating hormone (TSH) level 01/10/2015  . Allergic rhinitis, seasonal 01/10/2015  . History of anemia 01/10/2015  . Excessive and frequent menstruation with irregular cycle 01/10/2015  . Asymptomatic varicose veins of both lower extremities 01/10/2015  . Neck pain, musculoskeletal 01/10/2015  . Hand numbness 09/28/2014  . Hand weakness  09/28/2014    Social History   Tobacco Use  . Smoking status: Never Smoker  . Smokeless tobacco: Never Used  Substance Use Topics  . Alcohol use: No    Alcohol/week: 0.0 oz     Current Outpatient Medications:  .  albuterol (PROVENTIL HFA;VENTOLIN HFA) 108 (90 Base) MCG/ACT inhaler, Inhale 2 puffs into the lungs every 4 (four) hours as needed for wheezing or shortness of breath., Disp: 1 Inhaler, Rfl: 1 .  fluticasone (FLONASE) 50 MCG/ACT nasal spray, Place 2 sprays into both nostrils daily. (Patient taking differently: Place 2 sprays into both nostrils as needed. ), Disp: 16 g, Rfl: 12 .  ibuprofen (ADVIL,MOTRIN) 800 MG tablet, Take 1 tablet (800 mg total) by mouth every 8 (eight) hours as needed., Disp: 50 tablet, Rfl: 1 .  olopatadine (PATANOL) 0.1 % ophthalmic solution, Place 1 drop into both eyes 2 (two) times daily. (Patient taking differently: Place 1 drop into both eyes as needed. ), Disp: 5 mL, Rfl: 12  No Known Allergies  ROS  Ten systems reviewed and is negative except as mentioned in HPI   Objective  Vitals:   03/14/17 1515  BP: 136/68  Pulse: 96  Resp: 16  Temp: (!) 100.4 F (38 C)  TempSrc: Oral  SpO2: 98%  Weight: 187 lb 1.6 oz (84.9 kg)    Body mass index is 29.3 kg/m.    Physical Exam   Constitutional: Patient appears well-developed and well-nourished. Overweight.   No distress.  HEENT: head atraumatic, normocephalic, pupils equal and reactive to light, neck supple, throat showed 1 plus tonsils, with mild exudate and significant erythema, tender anterior cervical adenopathy, worse on left side  Cardiovascular: Normal rate, regular rhythm and normal heart sounds.  No murmur heard. No BLE edema. Pulmonary/Chest: Effort normal and breath sounds normal. No respiratory distress. Abdominal: Soft.  There is no tenderness. Psychiatric: Patient has a normal mood and affect. behavior is normal. Judgment and thought content normal.    Assessment &  Plan  1. Sore throat  - POCT rapid strep A - positive   2. Strep throat  - amoxicillin (AMOXIL) 500 MG capsule; Take 1 capsule (500 mg total) 3 (three) times daily by mouth.  Dispense: 30 capsule; Refill: 0 - Diphenhyd-Hydrocort-Nystatin (FIRST-DUKES MOUTHWASH) SUSP; Use as directed 15 mLs 4 (four) times daily -  before meals and at bedtime in the mouth or throat.  Dispense: 237 mL; Refill: 0   -Red flags and when to present for emergency care or RTC including fever >101.70F, chest pain, shortness of breath, new/worsening/un-resolving symptoms, discussed difficulty opening jaw or severe pain on one side of throat could be peritonsillar abscess and [facility] reviewed with patient at time of visit. Follow up and care instructions discussed and provided in AVS.

## 2017-03-14 NOTE — Telephone Encounter (Signed)
  Pt c/o fever,sore throat, chest pain from coughing, non productive cough. No fever today.  Reason for Disposition . ALSO, mild central chest pain occurs only when coughing  Answer Assessment - Initial Assessment Questions 1. ONSET: "When did the cough begin?"      yesterday 2. SEVERITY: "How bad is the cough today?"      Not bad 3. RESPIRATORY DISTRESS: "Describe your breathing."      no 4. FEVER: "Do you have a fever?" If so, ask: "What is your temperature, how was it measured, and when did it start?"     100 yesterday 5. SPUTUM: "Describe the color of your sputum" (clear, white, yellow, green)     no 6. HEMOPTYSIS: "Are you coughing up any blood?" If so ask: "How much?" (flecks, streaks, tablespoons, etc.)     no 7. CARDIAC HISTORY: "Do you have any history of heart disease?" (e.g., heart attack, congestive heart failure)      no 8. LUNG HISTORY: "Do you have any history of lung disease?"  (e.g., pulmonary embolus, asthma, emphysema)     no 9. PE RISK FACTORS: "Do you have a history of blood clots?" (or: recent major surgery, recent prolonged travel, bedridden )     no 10. OTHER SYMPTOMS: "Do you have any other symptoms?" (e.g., runny nose, wheezing, chest pain)       no 11. PREGNANCY: "Is there any chance you are pregnant?" "When was your last menstrual period?"       No LMP now 12. TRAVEL: "Have you traveled out of the country in the last month?" (e.g., travel history, exposures)       no  Protocols used: COUGH - ACUTE NON-PRODUCTIVE-A-AH, COUGH - ACUTE PRODUCTIVE-A-AH

## 2017-03-14 NOTE — Telephone Encounter (Signed)
Pt was seen today for a sore throat. Pt asked for a work note and I mention to pt that I will write it for her if she can sit in the lobby. Once I was done with letter to give to her the pt was gone. I put the letter up front.

## 2017-03-18 ENCOUNTER — Ambulatory Visit (INDEPENDENT_AMBULATORY_CARE_PROVIDER_SITE_OTHER): Payer: BLUE CROSS/BLUE SHIELD | Admitting: Obstetrics and Gynecology

## 2017-03-18 ENCOUNTER — Encounter: Payer: Self-pay | Admitting: Obstetrics and Gynecology

## 2017-03-18 VITALS — BP 137/85 | HR 73 | Ht 67.0 in | Wt 189.4 lb

## 2017-03-18 DIAGNOSIS — Z01818 Encounter for other preprocedural examination: Secondary | ICD-10-CM

## 2017-03-18 DIAGNOSIS — N920 Excessive and frequent menstruation with regular cycle: Secondary | ICD-10-CM

## 2017-03-18 NOTE — H&P (View-Only) (Signed)
Subjective: PREOPERATIVE HISTORY AND PHYSICAL  Date of surgery: 03/24/2017 Procedure: Hysteroscopy/D&C with NovaSure endometrial ablation Diagnosis: Menorrhagia    Patient is a 46 y.o. [id number] scheduled for surgery on 03/24/2017. Preoperative workup included: 01/30/2017 ultrasound: ULTRASOUND REPORT  Location: [facility] Date of Service: 01/30/17   Indications: Menorrhagia and history of ovarian cysts Findings:  The uterus is anteverted and measures 9.5 x 4.5 x 5.2 cm. Echo texture is diffusely homogenous without evidence of focal masses.   The Endometrium appears WNL and measures 7.8 mm.  Right Ovary measures 2.6 x 2.5 x 2.4 cm, and appears WNL. Left Ovary measures 2.5 x 1.4 x 2.6 cm. Dominant follicle is seen, otherwise, appears WNL. Survey of the adnexa demonstrates no adnexal masses. There is no free fluid in the cul de sac.  Impression: 1. Normal appearing pelvic ultrasound.  Recommendations: 1.Clinical correlation with the patient's History and Physical Exam.  Andrea Pope, RDMS, RVT Andrea Kadar A Jaye Polidori, MD  01/28/2017 endometrial biopsy: Diagnosis:  ENDOMETRIUM, BIOPSY:  PROLIFERATIVE ENDOMETRIUM. NO HYPERPLASIA OR CARCINOMA.  ACA/01/30/2017   Pertinent Gynecological History: Menarche: Age 15 History of irregular cycles during teenage years History of normal cycles thereafter until most recent abnormalities. Intervals: Monthly Duration of flow: 7 days Dysmenorrhea history: Moderate to severe with need for Tylenol and Motrin for pain relief; patient did miss school and work in the past because of severe dysmenorrhea Pain character: Midline Cramping along with low back pain with radiation into the buttocks and thighs Dyspareunia: Intermittent history of deep thrusting pain, worse perimenstrually Did not use birth control pills in the past because nausea intolerance Did use Depo-Provera in the past with success as it caused amenorrhea  and suppression of pelvic pain  Patient does not report any hirsutism hair growth or other associated changes with hyperandrogenism  OB History    Gravida Para Term Preterm AB Living   4 4 4     4   SAB TAB Ectopic Multiple Live Births           4      Patient's last menstrual period was 03/14/2017.    Past Medical History:  Diagnosis Date  . Abnormal thyroid stimulating hormone level   . Allergy   . Anemia   . Encounter for initial prescription of injectable contraceptive   . Female stress incontinence   . High cholesterol   . History of ectopic pregnancy 11/29/2015  . Menorrhagia with irregular cycle   . Ovarian cyst 11/29/2015  . Spider veins of both lower extremities     Past Surgical History:  Procedure Laterality Date  . laser surgery on eye Right 10/2016    OB History  Gravida Para Term Preterm AB Living  4 4 4     4  SAB TAB Ectopic Multiple Live Births          4    # Outcome Date GA Lbr Len/2nd Weight Sex Delivery Anes PTL Lv  4 Term 2009   9 lb (4.082 kg) M Vag-Spont   LIV  3 Term 2006   9 lb (4.082 kg) F Vag-Spont   LIV  2 Term 2002   9 lb (4.082 kg) M Vag-Spont   LIV  1 Term 2001   6 lb 12.8 oz (3.084 kg) M Vag-Spont   LIV      Social History   Socioeconomic History  . Marital status: Married    Spouse name: None  . Number of children: None  . Years   of education: None  . Highest education level: None  Social Needs  . Financial resource strain: None  . Food insecurity - worry: None  . Food insecurity - inability: None  . Transportation needs - medical: None  . Transportation needs - non-medical: None  Occupational History  . None  Tobacco Use  . Smoking status: Never Smoker  . Smokeless tobacco: Never Used  Substance and Sexual Activity  . Alcohol use: No    Alcohol/week: 0.0 oz  . Drug use: No  . Sexual activity: Yes    Partners: Male    Birth control/protection: None  Other Topics Concern  . None  Social History Narrative  .  None    Family History  Problem Relation Age of Onset  . Hypertension Mother   . Thyroid disease Mother   . Kidney disease Mother   . Hypertension Father   . Cancer Father        Prostate  . Diabetes Brother   . Diabetes Other   . Cancer Paternal Uncle        Prostate  . Diabetes Maternal Grandmother   . Cancer Paternal Grandfather        Prostate  . Diabetes Sister   . Schizophrenia Sister   . Breast cancer Neg Hx   . Ovarian cancer Neg Hx   . Heart disease Neg Hx      (Not in a hospital admission)  No Known Allergies  Review of Systems Constitutional: No recent fever/chills/sweats Respiratory: No recent cough/bronchitis Cardiovascular: No chest pain Gastrointestinal: No recent nausea/vomiting/diarrhea Genitourinary: No UTI symptoms Hematologic/lymphatic:No history of coagulopathy or recent blood thinner use    Objective:    BP 137/85   Pulse 73   Ht 5' 7" (1.702 m)   Wt 189 lb 6.4 oz (85.9 kg)   LMP 03/14/2017   BMI 29.66 kg/m   General:   Normal  Skin:   normal  HEENT:  Normal  Neck:  Supple without Adenopathy or Thyromegaly  Lungs:   Heart:              Breasts:   Abdomen:  Pelvis:  M/S   Extremeties:  Neuro:    clear to auscultation bilaterally   Normal without murmur   Not Examined   soft, non-tender; bowel sounds normal; no masses,  no organomegaly   Exam deferred to OR  No CVAT  Warm/Dry   Normal        01/28/2017 Pleasant well-appearing female in no acute distress. Alert and oriented. Abdomen: Soft, nontender without organomegaly Pelvic exam: External genitalia normal BUS-normal Vagina-normal estrogen effect; no lesions Cervix-parous; no cervical motion tenderness Uterus-midplane, top normal size, mobile, nontender Adnexa-nonpalpable nontender Rectovaginal-normal external exam   Assessment:     Menorrhagia Benign endometrial biopsy Normal pelvic ultrasound   Plan:  Hysteroscopy/D&C with NovaSure endometrial ablation     Preop counseling: The patient is to undergo hysteroscopy/D&C with NovaSure endometrial ablation for management of menorrhagia.  She is understanding of the planned procedure and is aware of and is accepting of all surgical risks which include but are not limited to bleeding, infection, pelvic organ injury with need for repair, blood clot disorders, anesthesia risk, etc.  All questions have been answered.  Informed consent is given.  Patient is very willing to proceed with surgery as scheduled.  Mihir Flanigan A Mayvis Agudelo, MD  Note: This dictation was prepared with Dragon dictation along with smaller phrase technology. Any transcriptional errors that result from this process   are unintentional.     

## 2017-03-18 NOTE — Patient Instructions (Signed)
1.  Return for postop check 1 week after surgery 

## 2017-03-18 NOTE — Progress Notes (Signed)
Subjective: PREOPERATIVE HISTORY AND PHYSICAL  Date of surgery: 03/24/2017 Procedure: Hysteroscopy/D&C with NovaSure endometrial ablation Diagnosis: Menorrhagia    Patient is a 46 y.o. [id number] scheduled for surgery on 03/24/2017. Preoperative workup included: 01/30/2017 ultrasound: ULTRASOUND REPORT  Location: [facility] Date of Service: 01/30/17   Indications: Menorrhagia and history of ovarian cysts Findings:  The uterus is anteverted and measures 9.5 x 4.5 x 5.2 cm. Echo texture is diffusely homogenous without evidence of focal masses.   The Endometrium appears WNL and measures 7.8 mm.  Right Ovary measures 2.6 x 2.5 x 2.4 cm, and appears WNL. Left Ovary measures 2.5 x 1.4 x 2.6 cm. Dominant follicle is seen, otherwise, appears WNL. Survey of the adnexa demonstrates no adnexal masses. There is no free fluid in the cul de sac.  Impression: 1. Normal appearing pelvic ultrasound.  Recommendations: 1.Clinical correlation with the patient's History and Physical Exam.  Revonda Humphreyeresa Elliott, RDMS, RVT Herold HarmsMartin A Aqib Lough, MD  01/28/2017 endometrial biopsy: Diagnosis:  ENDOMETRIUM, BIOPSY:  PROLIFERATIVE ENDOMETRIUM. NO HYPERPLASIA OR CARCINOMA.  ACA/01/30/2017   Pertinent Gynecological History: Menarche: Age 46 History of irregular cycles during teenage years History of normal cycles thereafter until most recent abnormalities. Intervals: Monthly Duration of flow: 7 days Dysmenorrhea history: Moderate to severe with need for Tylenol and Motrin for pain relief; patient did miss school and work in the past because of severe dysmenorrhea Pain character: Midline Cramping along with low back pain with radiation into the buttocks and thighs Dyspareunia: Intermittent history of deep thrusting pain, worse perimenstrually Did not use birth control pills in the past because nausea intolerance Did use Depo-Provera in the past with success as it caused amenorrhea  and suppression of pelvic pain  Patient does not report any hirsutism hair growth or other associated changes with hyperandrogenism  OB History    Gravida Para Term Preterm AB Living   4 4 4     4    SAB TAB Ectopic Multiple Live Births           4      Patient's last menstrual period was 03/14/2017.    Past Medical History:  Diagnosis Date  . Abnormal thyroid stimulating hormone level   . Allergy   . Anemia   . Encounter for initial prescription of injectable contraceptive   . Female stress incontinence   . High cholesterol   . History of ectopic pregnancy 11/29/2015  . Menorrhagia with irregular cycle   . Ovarian cyst 11/29/2015  . Spider veins of both lower extremities     Past Surgical History:  Procedure Laterality Date  . laser surgery on eye Right 10/2016    OB History  Gravida Para Term Preterm AB Living  4 4 4     4   SAB TAB Ectopic Multiple Live Births          4    # Outcome Date GA Lbr Len/2nd Weight Sex Delivery Anes PTL Lv  4 Term 2009   9 lb (4.082 kg) M Vag-Spont   LIV  3 Term 2006   9 lb (4.082 kg) F Vag-Spont   LIV  2 Term 2002   9 lb (4.082 kg) M Vag-Spont   LIV  1 Term 2001   6 lb 12.8 oz (3.084 kg) M Vag-Spont   LIV      Social History   Socioeconomic History  . Marital status: Married    Spouse name: None  . Number of children: None  . Years  of education: None  . Highest education level: None  Social Needs  . Financial resource strain: None  . Food insecurity - worry: None  . Food insecurity - inability: None  . Transportation needs - medical: None  . Transportation needs - non-medical: None  Occupational History  . None  Tobacco Use  . Smoking status: Never Smoker  . Smokeless tobacco: Never Used  Substance and Sexual Activity  . Alcohol use: No    Alcohol/week: 0.0 oz  . Drug use: No  . Sexual activity: Yes    Partners: Male    Birth control/protection: None  Other Topics Concern  . None  Social History Narrative  .  None    Family History  Problem Relation Age of Onset  . Hypertension Mother   . Thyroid disease Mother   . Kidney disease Mother   . Hypertension Father   . Cancer Father        Prostate  . Diabetes Brother   . Diabetes Other   . Cancer Paternal Uncle        Prostate  . Diabetes Maternal Grandmother   . Cancer Paternal Grandfather        Prostate  . Diabetes Sister   . Schizophrenia Sister   . Breast cancer Neg Hx   . Ovarian cancer Neg Hx   . Heart disease Neg Hx      (Not in a hospital admission)  No Known Allergies  Review of Systems Constitutional: No recent fever/chills/sweats Respiratory: No recent cough/bronchitis Cardiovascular: No chest pain Gastrointestinal: No recent nausea/vomiting/diarrhea Genitourinary: No UTI symptoms Hematologic/lymphatic:No history of coagulopathy or recent blood thinner use    Objective:    BP 137/85   Pulse 73   Ht 5\' 7"  (1.702 m)   Wt 189 lb 6.4 oz (85.9 kg)   LMP 03/14/2017   BMI 29.66 kg/m   General:   Normal  Skin:   normal  HEENT:  Normal  Neck:  Supple without Adenopathy or Thyromegaly  Lungs:   Heart:              Breasts:   Abdomen:  Pelvis:  M/S   Extremeties:  Neuro:    clear to auscultation bilaterally   Normal without murmur   Not Examined   soft, non-tender; bowel sounds normal; no masses,  no organomegaly   Exam deferred to OR  No CVAT  Warm/Dry   Normal        01/28/2017 Pleasant well-appearing female in no acute distress. Alert and oriented. Abdomen: Soft, nontender without organomegaly Pelvic exam: External genitalia normal BUS-normal Vagina-normal estrogen effect; no lesions Cervix-parous; no cervical motion tenderness Uterus-midplane, top normal size, mobile, nontender Adnexa-nonpalpable nontender Rectovaginal-normal external exam   Assessment:     Menorrhagia Benign endometrial biopsy Normal pelvic ultrasound   Plan:  Hysteroscopy/D&C with NovaSure endometrial ablation     Preop counseling: The patient is to undergo hysteroscopy/D&C with NovaSure endometrial ablation for management of menorrhagia.  She is understanding of the planned procedure and is aware of and is accepting of all surgical risks which include but are not limited to bleeding, infection, pelvic organ injury with need for repair, blood clot disorders, anesthesia risk, etc.  All questions have been answered.  Informed consent is given.  Patient is very willing to proceed with surgery as scheduled.  Herold Harms, MD  Note: This dictation was prepared with Dragon dictation along with smaller phrase technology. Any transcriptional errors that result from this process  are unintentional.

## 2017-03-18 NOTE — H&P (Signed)
Subjective: PREOPERATIVE HISTORY AND PHYSICAL  Date of surgery: 03/24/2017 Procedure: Hysteroscopy/D&C with NovaSure endometrial ablation Diagnosis: Menorrhagia    Patient is a 46 y.o. G4P4004female scheduled for surgery on 03/24/2017. Preoperative workup included: 01/30/2017 ultrasound: ULTRASOUND REPORT  Location: [facility] Date of Service: 01/30/17   Indications: Menorrhagia and history of ovarian cysts Findings:  The uterus is anteverted and measures 9.5 x 4.5 x 5.2 cm. Echo texture is diffusely homogenous without evidence of focal masses.   The Endometrium appears WNL and measures 7.8 mm.  Right Ovary measures 2.6 x 2.5 x 2.4 cm, and appears WNL. Left Ovary measures 2.5 x 1.4 x 2.6 cm. Dominant follicle is seen, otherwise, appears WNL. Survey of the adnexa demonstrates no adnexal masses. There is no free fluid in the cul de sac.  Impression: 1. Normal appearing pelvic ultrasound.  Recommendations: 1.Clinical correlation with the patient's History and Physical Exam.  Teresa Elliott, RDMS, RVT Jerome Viglione A Estefana Taylor, MD  01/28/2017 endometrial biopsy: Diagnosis:  ENDOMETRIUM, BIOPSY:  PROLIFERATIVE ENDOMETRIUM. NO HYPERPLASIA OR CARCINOMA.  ACA/01/30/2017   Pertinent Gynecological History: Menarche: Age 15 History of irregular cycles during teenage years History of normal cycles thereafter until most recent abnormalities. Intervals: Monthly Duration of flow: 7 days Dysmenorrhea history: Moderate to severe with need for Tylenol and Motrin for pain relief; patient did miss school and work in the past because of severe dysmenorrhea Pain character: Midline Cramping along with low back pain with radiation into the buttocks and thighs Dyspareunia: Intermittent history of deep thrusting pain, worse perimenstrually Did not use birth control pills in the past because nausea intolerance Did use Depo-Provera in the past with success as it caused amenorrhea  and suppression of pelvic pain  Patient does not report any hirsutism hair growth or other associated changes with hyperandrogenism  OB History    Gravida Para Term Preterm AB Living   4 4 4     4   SAB TAB Ectopic Multiple Live Births           4      Patient's last menstrual period was 03/14/2017.    Past Medical History:  Diagnosis Date  . Abnormal thyroid stimulating hormone level   . Allergy   . Anemia   . Encounter for initial prescription of injectable contraceptive   . Female stress incontinence   . High cholesterol   . History of ectopic pregnancy 11/29/2015  . Menorrhagia with irregular cycle   . Ovarian cyst 11/29/2015  . Spider veins of both lower extremities     Past Surgical History:  Procedure Laterality Date  . laser surgery on eye Right 10/2016    OB History  Gravida Para Term Preterm AB Living  4 4 4     4  SAB TAB Ectopic Multiple Live Births          4    # Outcome Date GA Lbr Len/2nd Weight Sex Delivery Anes PTL Lv  4 Term 2009   9 lb (4.082 kg) M Vag-Spont   LIV  3 Term 2006   9 lb (4.082 kg) F Vag-Spont   LIV  2 Term 2002   9 lb (4.082 kg) M Vag-Spont   LIV  1 Term 2001   6 lb 12.8 oz (3.084 kg) M Vag-Spont   LIV      Social History   Socioeconomic History  . Marital status: Married    Spouse name: None  . Number of children: None  . Years   of education: None  . Highest education level: None  Social Needs  . Financial resource strain: None  . Food insecurity - worry: None  . Food insecurity - inability: None  . Transportation needs - medical: None  . Transportation needs - non-medical: None  Occupational History  . None  Tobacco Use  . Smoking status: Never Smoker  . Smokeless tobacco: Never Used  Substance and Sexual Activity  . Alcohol use: No    Alcohol/week: 0.0 oz  . Drug use: No  . Sexual activity: Yes    Partners: Male    Birth control/protection: None  Other Topics Concern  . None  Social History Narrative  .  None    Family History  Problem Relation Age of Onset  . Hypertension Mother   . Thyroid disease Mother   . Kidney disease Mother   . Hypertension Father   . Cancer Father        Prostate  . Diabetes Brother   . Diabetes Other   . Cancer Paternal Uncle        Prostate  . Diabetes Maternal Grandmother   . Cancer Paternal Grandfather        Prostate  . Diabetes Sister   . Schizophrenia Sister   . Breast cancer Neg Hx   . Ovarian cancer Neg Hx   . Heart disease Neg Hx      (Not in a hospital admission)  No Known Allergies  Review of Systems Constitutional: No recent fever/chills/sweats Respiratory: No recent cough/bronchitis Cardiovascular: No chest pain Gastrointestinal: No recent nausea/vomiting/diarrhea Genitourinary: No UTI symptoms Hematologic/lymphatic:No history of coagulopathy or recent blood thinner use    Objective:    BP 137/85   Pulse 73   Ht 5' 7" (1.702 m)   Wt 189 lb 6.4 oz (85.9 kg)   LMP 03/14/2017   BMI 29.66 kg/m   General:   Normal  Skin:   normal  HEENT:  Normal  Neck:  Supple without Adenopathy or Thyromegaly  Lungs:   Heart:              Breasts:   Abdomen:  Pelvis:  M/S   Extremeties:  Neuro:    clear to auscultation bilaterally   Normal without murmur   Not Examined   soft, non-tender; bowel sounds normal; no masses,  no organomegaly   Exam deferred to OR  No CVAT  Warm/Dry   Normal        01/28/2017 Pleasant well-appearing female in no acute distress. Alert and oriented. Abdomen: Soft, nontender without organomegaly Pelvic exam: External genitalia normal BUS-normal Vagina-normal estrogen effect; no lesions Cervix-parous; no cervical motion tenderness Uterus-midplane, top normal size, mobile, nontender Adnexa-nonpalpable nontender Rectovaginal-normal external exam   Assessment:     Menorrhagia Benign endometrial biopsy Normal pelvic ultrasound   Plan:  Hysteroscopy/D&C with NovaSure endometrial ablation     Preop counseling: The patient is to undergo hysteroscopy/D&C with NovaSure endometrial ablation for management of menorrhagia.  She is understanding of the planned procedure and is aware of and is accepting of all surgical risks which include but are not limited to bleeding, infection, pelvic organ injury with need for repair, blood clot disorders, anesthesia risk, etc.  All questions have been answered.  Informed consent is given.  Patient is very willing to proceed with surgery as scheduled.  Krikor Willet A Yoland Scherr, MD  Note: This dictation was prepared with Dragon dictation along with smaller phrase technology. Any transcriptional errors that result from this process   are unintentional.     

## 2017-03-20 ENCOUNTER — Other Ambulatory Visit: Payer: Self-pay

## 2017-03-20 ENCOUNTER — Encounter
Admission: RE | Admit: 2017-03-20 | Discharge: 2017-03-20 | Disposition: A | Discharge status: Home / Self Care | Payer: BLUE CROSS/BLUE SHIELD | Source: Ambulatory Visit | Attending: Obstetrics and Gynecology | Admitting: Obstetrics and Gynecology

## 2017-03-20 DIAGNOSIS — Z0183 Encounter for blood typing: Secondary | ICD-10-CM | POA: Insufficient documentation

## 2017-03-20 DIAGNOSIS — Z01812 Encounter for preprocedural laboratory examination: Secondary | ICD-10-CM | POA: Insufficient documentation

## 2017-03-20 DIAGNOSIS — N920 Excessive and frequent menstruation with regular cycle: Secondary | ICD-10-CM | POA: Insufficient documentation

## 2017-03-20 LAB — CBC WITH DIFFERENTIAL/PLATELET
BASOS PCT: 1 %
Basophils Absolute: 0 10*3/uL (ref 0–0.1)
EOS PCT: 7 %
Eosinophils Absolute: 0.3 10*3/uL (ref 0–0.7)
HEMATOCRIT: 39.1 % (ref 35.0–47.0)
Hemoglobin: 13 g/dL (ref 12.0–16.0)
Lymphocytes Relative: 50 %
Lymphs Abs: 2.6 10*3/uL (ref 1.0–3.6)
MCH: 30.6 pg (ref 26.0–34.0)
MCHC: 33.2 g/dL (ref 32.0–36.0)
MCV: 92.1 fL (ref 80.0–100.0)
MONO ABS: 0.4 10*3/uL (ref 0.2–0.9)
MONOS PCT: 8 %
NEUTROS ABS: 1.7 10*3/uL (ref 1.4–6.5)
Neutrophils Relative %: 34 %
Platelets: 341 10*3/uL (ref 150–440)
RBC: 4.25 MIL/uL (ref 3.80–5.20)
RDW: 13.1 % (ref 11.5–14.5)
WBC: 5.1 10*3/uL (ref 3.6–11.0)

## 2017-03-20 LAB — TYPE AND SCREEN
ABO/RH(D): O POS
Antibody Screen: NEGATIVE

## 2017-03-20 LAB — RAPID HIV SCREEN (HIV 1/2 AB+AG)
HIV 1/2 ANTIBODIES: NONREACTIVE
HIV-1 P24 Antigen - HIV24: NONREACTIVE

## 2017-03-20 NOTE — Patient Instructions (Signed)
Your procedure is scheduled on: Monday 03/24/17 Report to DAY SURGERY. 2ND FLOOR MEDICAL MALL ENTRANCE. To find out your arrival time please call [phone] between 1PM - 3PM on Friday 03/21/17.  Remember: Instructions that are not followed completely may result in serious medical risk, up to and including death, or upon the discretion of your surgeon and anesthesiologist your surgery may need to be rescheduled.    __X__ 1. Do not eat anything after midnight the night before your    procedure.  No gum chewing or hard candies.  You may drink clear   liquids up to 2 hours before you are scheduled to arrive at the   hospital for your procedure. Do not drink clear liquids within 2   hours of scheduled arrival to the hospital as this may lead to your   procedure being delayed or rescheduled.       Clear liquids include:   Water or Apple juice without pulp   Clear carbohydrate beverage such as Clearfast or Gatorade   Black coffee or Clear Tea (no milk, no creamer, do not add anything   to the coffee or tea)    Diabetics should only drink water   __X__ 2. No Alcohol for 24 hours before or after surgery.   ____ 3. Bring all medications with you on the day of surgery if instructed.    __X__ 4. Notify your doctor if there is any change in your medical condition     (cold, fever, infections).             __X___5. No smoking within 24 hours of your surgery.     Do not wear jewelry, make-up, hairpins, clips or nail polish.  Do not wear lotions, powders, or perfumes.   Do not shave 48 hours prior to surgery. Men may shave face and neck.  Do not bring valuables to the hospital.    [facility] belongings or valuables.               Contacts, dentures or bridgework may not be worn into surgery.  Leave your suitcase in the car. After surgery it may be brought to your room.  For patients admitted to the hospital, discharge time is determined by your                 treatment team.   Patients discharged the day of surgery will not be allowed to drive home.   Please read over the following fact sheets that you were given:   MRSA Information   __X__ Take these medicines the morning of surgery with A SIP OF WATER:    1. none  2.   3.   4.  5.  6.  ____ Fleet Enema (as directed)   ____ Use CHG Soap/SAGE wipes as directed  __X__ Use inhalers on the day of surgery  ____ Stop metformin 2 days prior to surgery    ____ Take 1/2 of usual insulin dose the night before surgery and none on the morning of surgery.   ____ Stop Coumadin/Plavix/aspirin on   __X__ Stop Anti-inflammatories such as Advil, Aleve, Ibuprofen, Motrin, Naproxen, Naprosyn, Goodies,powder, or aspirin products.  OK to take Tylenol.   __X__ Stop supplements, Vitamin E, Fish Oil until after surgery.    ____ Bring C-Pap to the hospital.

## 2017-03-21 LAB — RPR: RPR: NONREACTIVE

## 2017-03-24 ENCOUNTER — Ambulatory Visit
Admission: RE | Admit: 2017-03-24 | Discharge: 2017-03-24 | Disposition: A | Discharge status: Home / Self Care | Payer: BLUE CROSS/BLUE SHIELD | Source: Ambulatory Visit | Attending: Obstetrics and Gynecology | Admitting: Obstetrics and Gynecology

## 2017-03-24 ENCOUNTER — Ambulatory Visit: Payer: BLUE CROSS/BLUE SHIELD | Admitting: Anesthesiology

## 2017-03-24 ENCOUNTER — Encounter: Payer: Self-pay | Admitting: *Deleted

## 2017-03-24 ENCOUNTER — Other Ambulatory Visit: Payer: Self-pay

## 2017-03-24 ENCOUNTER — Encounter
Admission: RE | Disposition: A | Discharge status: Home / Self Care | Payer: Self-pay | Source: Ambulatory Visit | Attending: Obstetrics and Gynecology

## 2017-03-24 DIAGNOSIS — N920 Excessive and frequent menstruation with regular cycle: Secondary | ICD-10-CM | POA: Diagnosis not present

## 2017-03-24 DIAGNOSIS — Z9889 Other specified postprocedural states: Secondary | ICD-10-CM

## 2017-03-24 DIAGNOSIS — Z791 Long term (current) use of non-steroidal anti-inflammatories (NSAID): Secondary | ICD-10-CM | POA: Insufficient documentation

## 2017-03-24 HISTORY — DX: Other specified postprocedural states: Z98.890

## 2017-03-24 HISTORY — PX: DILITATION & CURRETTAGE/HYSTROSCOPY WITH NOVASURE ABLATION: SHX5568

## 2017-03-24 LAB — POCT PREGNANCY, URINE: PREG TEST UR: NEGATIVE

## 2017-03-24 LAB — ABO/RH: ABO/RH(D): O POS

## 2017-03-24 SURGERY — DILATATION & CURETTAGE/HYSTEROSCOPY WITH NOVASURE ABLATION
Anesthesia: General

## 2017-03-24 MED ORDER — OXYCODONE-ACETAMINOPHEN 5-325 MG PO TABS: 1.0000 | ORAL_TABLET | ORAL | 0 refills | Status: DC | PRN

## 2017-03-24 MED ORDER — ONDANSETRON HCL 4 MG/2ML IJ SOLN
INTRAMUSCULAR | Status: DC | PRN
  Administered 2017-03-24: 4 mg via INTRAVENOUS

## 2017-03-24 MED ORDER — DEXAMETHASONE SODIUM PHOSPHATE 10 MG/ML IJ SOLN
INTRAMUSCULAR | Status: DC | PRN
  Administered 2017-03-24: 5 mg via INTRAVENOUS

## 2017-03-24 MED ORDER — FAMOTIDINE 20 MG PO TABS
20.0000 mg | ORAL_TABLET | Freq: Once | ORAL | Status: AC
  Administered 2017-03-24: 20 mg via ORAL

## 2017-03-24 MED ORDER — PROMETHAZINE HCL 25 MG/ML IJ SOLN: 6.2500 mg | INTRAMUSCULAR | Status: DC | PRN

## 2017-03-24 MED ORDER — FAMOTIDINE 20 MG PO TABS
ORAL_TABLET | ORAL | Status: AC
  Administered 2017-03-24: 20 mg via ORAL
  Filled 2017-03-24: qty 1

## 2017-03-24 MED ORDER — FENTANYL CITRATE (PF) 100 MCG/2ML IJ SOLN
INTRAMUSCULAR | Status: AC
  Filled 2017-03-24: qty 2

## 2017-03-24 MED ORDER — KETOROLAC TROMETHAMINE 30 MG/ML IJ SOLN
INTRAMUSCULAR | Status: AC
  Filled 2017-03-24: qty 1

## 2017-03-24 MED ORDER — PROPOFOL 10 MG/ML IV BOLUS
INTRAVENOUS | Status: DC | PRN
  Administered 2017-03-24: 150 mg via INTRAVENOUS

## 2017-03-24 MED ORDER — MIDAZOLAM HCL 2 MG/2ML IJ SOLN
INTRAMUSCULAR | Status: AC
  Filled 2017-03-24: qty 2

## 2017-03-24 MED ORDER — FENTANYL CITRATE (PF) 100 MCG/2ML IJ SOLN
INTRAMUSCULAR | Status: DC | PRN
  Administered 2017-03-24 (×2): 25 ug via INTRAVENOUS
  Administered 2017-03-24: 50 ug via INTRAVENOUS

## 2017-03-24 MED ORDER — FENTANYL CITRATE (PF) 100 MCG/2ML IJ SOLN: 25.0000 ug | INTRAMUSCULAR | Status: DC | PRN

## 2017-03-24 MED ORDER — MIDAZOLAM HCL 2 MG/2ML IJ SOLN
INTRAMUSCULAR | Status: DC | PRN
  Administered 2017-03-24: 2 mg via INTRAVENOUS

## 2017-03-24 MED ORDER — DEXAMETHASONE SODIUM PHOSPHATE 10 MG/ML IJ SOLN
INTRAMUSCULAR | Status: AC
  Filled 2017-03-24: qty 1

## 2017-03-24 MED ORDER — KETOROLAC TROMETHAMINE 30 MG/ML IJ SOLN
INTRAMUSCULAR | Status: DC | PRN
  Administered 2017-03-24: 30 mg via INTRAVENOUS

## 2017-03-24 MED ORDER — LACTATED RINGERS IV SOLN
INTRAVENOUS | Status: DC
  Administered 2017-03-24: 14:00:00 via INTRAVENOUS

## 2017-03-24 SURGICAL SUPPLY — 12 items
CATH ROBINSON RED A/P 16FR (CATHETERS) ×3 IMPLANT
GLOVE BIO SURGEON STRL SZ8 (GLOVE) ×3 IMPLANT
GOWN STRL REUS W/ TWL LRG LVL3 (GOWN DISPOSABLE) ×1 IMPLANT
GOWN STRL REUS W/ TWL XL LVL3 (GOWN DISPOSABLE) ×1 IMPLANT
GOWN STRL REUS W/TWL LRG LVL3 (GOWN DISPOSABLE) ×3
GOWN STRL REUS W/TWL XL LVL3 (GOWN DISPOSABLE) ×3
IV LACTATED RINGERS 1000ML (IV SOLUTION) ×3 IMPLANT
KIT RM TURNOVER CYSTO AR (KITS) ×3 IMPLANT
NOVASURE ENDOMETRIAL ABLATION (MISCELLANEOUS) ×3 IMPLANT
PACK DNC HYST (MISCELLANEOUS) ×3 IMPLANT
PAD OB MATERNITY 4.3X12.25 (PERSONAL CARE ITEMS) ×3 IMPLANT
PAD PREP 24X41 OB/GYN DISP (PERSONAL CARE ITEMS) ×3 IMPLANT

## 2017-03-24 NOTE — Transfer of Care (Signed)
Immediate Anesthesia Transfer of Care Note  Patient: Andrea Pope  Procedure(s) Performed: DILATATION & CURETTAGE/HYSTEROSCOPY WITH NOVASURE ABLATION (N/A )  Patient Location: PACU  Anesthesia Type:General  Level of Consciousness: drowsy and patient cooperative  Airway & Oxygen Therapy: Patient Spontanous Breathing and Patient connected to face mask oxygen  Post-op Assessment: Report given to RN and Post -op Vital signs reviewed and stable  Post vital signs: Reviewed  Last Vitals:  Vitals:   03/24/17 1403 03/24/17 1704  BP: (!) 167/99 (!) 160/93  Pulse: 79 71  Resp: 18 (!) 21  Temp: (!) 36.3 C 36.8 C  SpO2: 100% 100%    Last Pain:  Vitals:   03/24/17 1403  TempSrc: Tympanic         Complications: No apparent anesthesia complications

## 2017-03-24 NOTE — Anesthesia Post-op Follow-up Note (Signed)
Anesthesia QCDR form completed.        

## 2017-03-24 NOTE — Op Note (Signed)
OPERATIVE NOTE  Date of surgery: 03/24/2017  Preoperative diagnosis: 1.  Menorrhagia  Postoperative diagnosis: 1.  Menorrhagia  Operative procedure: 1. Hysteroscopy 2. Dilation and curettage of endometrium 3.  NovaSure endometrial ablation  Surgeon: Dr. Beatris Sie [name] Assistant: None Anesthesia: Scarlett PrestoGeneral-LMA   Findings: Pelvis: Gynecoid; excellent TVH candidate if necessary Uterus: sounded to 9.5 cm Normal appearing endometrial cavity without gross lesions.  Description of procedure Patient was brought to the operating room where she was placed in supine position. General anesthesia was induced without difficulty using the LMA technique. The patient was placed in dorsal lithotomy position using candy cane stirrups. A betadine perineal intravaginal prep and drape was performed in standard fashion. Weighted speculum was placed in the vagina. Single-tooth tenaculum was placed on the anterior cervix. Uterus was sounded to 9.5 cm.  Cervical length is 4 cm.  Hegar dilators were used up to a #8 JamaicaFrench caliber to dilate the endocervical canal. The Myosure hysteroscope using lactated Ringer's as irrigant was used for the hysteroscopy. The above-noted findings were photo documented. The hysteroscope removed. The smooth and serrated curettes were then used to curettage the endometrial cavity with production of average tissue. Stone polyp forceps were used to grasp residual tissue left behind. Repeat hysteroscopy was performed and demonstrated excellent sampling.  The NovaSure instrument was then deployed.  Cavity width was noted to be 3.8 cm.  Cavity test was completed and verified with integrity.  The NovaSure was then performed for a total of 1 minute and 50 seconds.  Following completion of the ablation, repeat hysteroscopy demonstrated an excellent char effect.  Procedure was then terminated with all instrumentation being removed from the vagina. The patient was then awakened mobilized and taken to  the recovery room in satisfactory condition.  IV fluids: 500 mL. Urine output: 25 mL. EBL: 25 mL.  Instruments, needles, and sponge counts were verified as correct.  Herold HarmsMartin A Piero Mustard, MD 03/24/2017

## 2017-03-24 NOTE — Discharge Instructions (Signed)

## 2017-03-24 NOTE — Anesthesia Postprocedure Evaluation (Signed)
Anesthesia Post Note  Patient: Andrea Pope  Procedure(s) Performed: DILATATION & CURETTAGE/HYSTEROSCOPY WITH NOVASURE ABLATION (N/A )  Patient location during evaluation: PACU Anesthesia Type: General Level of consciousness: awake and alert Pain management: pain level controlled Vital Signs Assessment: post-procedure vital signs reviewed and stable Respiratory status: spontaneous breathing, nonlabored ventilation, respiratory function stable and patient connected to nasal cannula oxygen Cardiovascular status: blood pressure returned to baseline and stable Postop Assessment: no apparent nausea or vomiting Anesthetic complications: no     Last Vitals:  Vitals:   03/24/17 1750 03/24/17 1755  BP: (!) 149/98   Pulse: (!) 55 (!) 50  Resp: 12 11  Temp:  36.5 C  SpO2: 100% 98%    Last Pain:  Vitals:   03/24/17 1755  TempSrc:   PainSc: 0-No pain                 Lenard SimmerAndrew Sira Adsit

## 2017-03-24 NOTE — Interval H&P Note (Signed)
History and Physical Interval Note:  03/24/2017 2:34 PM  Andrea Pope  has presented today for surgery, with the diagnosis of MENORRHAGIA  The various methods of treatment have been discussed with the patient and family. After consideration of risks, benefits and other options for treatment, the patient has consented to  Procedure(s): DILATATION & CURETTAGE/HYSTEROSCOPY WITH NOVASURE ABLATION (N/A) as a surgical intervention .  The patient's history has been reviewed, patient examined, no change in status, stable for surgery.  I have reviewed the patient's chart and labs.  Questions were answered to the patient's satisfaction.     Daphine DeutscherMartin A Kayode Petion

## 2017-03-24 NOTE — Anesthesia Preprocedure Evaluation (Signed)
Anesthesia Evaluation  Patient identified by MRN, date of birth, ID band Patient awake    Reviewed: Allergy & Precautions, H&P , NPO status , Patient's Chart, lab work & pertinent test results, reviewed documented beta blocker date and time   History of Anesthesia Complications Negative for: history of anesthetic complications  Airway Mallampati: III  TM Distance: >3 FB Neck ROM: full    Dental  (+) Teeth Intact, Dental Advidsory Given   Pulmonary neg pulmonary ROS,           Cardiovascular Exercise Tolerance: Good negative cardio ROS       Neuro/Psych negative neurological ROS  negative psych ROS   GI/Hepatic negative GI ROS, Neg liver ROS,   Endo/Other  negative endocrine ROS  Renal/GU negative Renal ROS  negative genitourinary   Musculoskeletal   Abdominal   Peds  Hematology negative hematology ROS (+)   Anesthesia Other Findings Past Medical History: No date: Abnormal thyroid stimulating hormone level No date: Allergy No date: Anemia     Comment:  iron deficiency No date: Encounter for initial prescription of injectable  contraceptive No date: Female stress incontinence No date: High cholesterol 11/29/2015: History of ectopic pregnancy No date: Menorrhagia with irregular cycle 11/29/2015: Ovarian cyst No date: Spider veins of both lower extremities   Reproductive/Obstetrics negative OB ROS                             Anesthesia Physical Anesthesia Plan  ASA: I  Anesthesia Plan: General   Post-op Pain Management:    Induction: Intravenous  PONV Risk Score and Plan: 3 and Ondansetron and Dexamethasone  Airway Management Planned: LMA  Additional Equipment:   Intra-op Plan:   Post-operative Plan: Extubation in OR  Informed Consent: I have reviewed the patients History and Physical, chart, labs and discussed the procedure including the risks, benefits and  alternatives for the proposed anesthesia with the patient or authorized representative who has indicated his/her understanding and acceptance.   Dental Advisory Given  Plan Discussed with: Anesthesiologist, CRNA and Surgeon  Anesthesia Plan Comments:         Anesthesia Quick Evaluation

## 2017-03-24 NOTE — Anesthesia Procedure Notes (Signed)
Procedure Name: LMA Insertion Date/Time: 03/24/2017 4:23 PM Performed by: Omer JackWeatherly, Millena Callins, CRNA Pre-anesthesia Checklist: Patient identified, Patient being monitored, Timeout performed, Emergency Drugs available and Suction available Patient Re-evaluated:Patient Re-evaluated prior to induction Oxygen Delivery Method: Circle system utilized Preoxygenation: Pre-oxygenation with 100% oxygen Induction Type: IV induction Ventilation: Mask ventilation without difficulty LMA: LMA inserted LMA Size: 4.0 Tube type: Oral Number of attempts: 1 Placement Confirmation: positive ETCO2 and breath sounds checked- equal and bilateral Tube secured with: Tape Dental Injury: Teeth and Oropharynx as per pre-operative assessment

## 2017-03-25 ENCOUNTER — Encounter: Payer: Self-pay | Admitting: Obstetrics and Gynecology

## 2017-04-02 ENCOUNTER — Encounter: Payer: Self-pay | Admitting: Obstetrics and Gynecology

## 2017-04-02 ENCOUNTER — Ambulatory Visit (INDEPENDENT_AMBULATORY_CARE_PROVIDER_SITE_OTHER): Payer: BLUE CROSS/BLUE SHIELD | Admitting: Obstetrics and Gynecology

## 2017-04-02 VITALS — BP 129/78 | HR 67 | Ht 68.0 in | Wt 191.0 lb

## 2017-04-02 DIAGNOSIS — N920 Excessive and frequent menstruation with regular cycle: Secondary | ICD-10-CM

## 2017-04-02 DIAGNOSIS — Z09 Encounter for follow-up examination after completed treatment for conditions other than malignant neoplasm: Secondary | ICD-10-CM

## 2017-04-02 DIAGNOSIS — Z9889 Other specified postprocedural states: Secondary | ICD-10-CM

## 2017-04-02 NOTE — Progress Notes (Signed)
Chief complaint: 1. One week postop check 2. Status post hysteroscopy/D&C with NovaSure endometrial ablation 3. History of menorrhagia with regular cycles  Patient presents for 1 week postop check. She has done well post surgery with minimal pelvic pain, no vaginal bleeding, and mild drainage. The drainage from the vagina is slightly odorous. She denies fevers chills and sweats. She denies UTI symptoms.  Pathology from surgery: Proliferative endometrium with breakdown changes; negative for atypia, hyperplasia, and carcinoma.  OBJECTIVE: BP 129/78   Pulse 67   Ht 5\' 8"  (1.727 m)   Wt 191 lb (86.6 kg)   LMP 03/14/2017   BMI 29.04 kg/m  Physical exam-deferred  ASSESSMENT: 1. One week postop check-normal 2. Status post hysteroscopy/D&C with NovaSure endometrial ablation 3. History of menorrhagia with regular cycles  PLAN: 1. Resume activities as tolerated with the exception of intercourse for another 3 weeks 2. Maintain menstrual calendar monitoring for any abnormal uterine bleeding 3. Return in 3 months for follow-up.  Herold HarmsMartin A Howell Groesbeck, MD  Note: This dictation was prepared with Dragon dictation along with smaller phrase technology. Any transcriptional errors that result from this process are unintentional.

## 2017-04-02 NOTE — Patient Instructions (Signed)
1. Resume activities as tolerated with the exception of intercourse for another 3 weeks 2. Monitor for any abnormal uterine bleeding 3. Return in 3 months for follow-up

## 2017-04-24 LAB — SURGICAL PATHOLOGY

## 2017-05-05 ENCOUNTER — Encounter: Payer: Self-pay | Admitting: Family Medicine

## 2017-05-05 DIAGNOSIS — G8929 Other chronic pain: Secondary | ICD-10-CM

## 2017-05-05 DIAGNOSIS — M25561 Pain in right knee: Principal | ICD-10-CM

## 2017-05-05 NOTE — Telephone Encounter (Signed)
Refer to ortho.

## 2017-05-13 DIAGNOSIS — M222X9 Patellofemoral disorders, unspecified knee: Secondary | ICD-10-CM | POA: Insufficient documentation

## 2017-05-13 DIAGNOSIS — M1711 Unilateral primary osteoarthritis, right knee: Secondary | ICD-10-CM | POA: Insufficient documentation

## 2017-05-13 HISTORY — DX: Patellofemoral disorders, unspecified knee: M22.2X9

## 2017-06-22 NOTE — Progress Notes (Signed)
Closing out lab/order note open since:  July 2018 

## 2017-06-22 NOTE — Telephone Encounter (Signed)
Signing off on old MyChart message

## 2017-06-25 ENCOUNTER — Ambulatory Visit: Payer: BLUE CROSS/BLUE SHIELD | Admitting: Family Medicine

## 2017-07-03 ENCOUNTER — Encounter: Payer: BLUE CROSS/BLUE SHIELD | Admitting: Obstetrics and Gynecology

## 2017-10-10 ENCOUNTER — Encounter: Payer: Self-pay | Admitting: Family Medicine

## 2017-11-10 ENCOUNTER — Encounter: Payer: Self-pay | Admitting: Family Medicine

## 2017-11-10 ENCOUNTER — Ambulatory Visit: Payer: Medicaid Other | Admitting: Family Medicine

## 2017-11-10 ENCOUNTER — Encounter: Payer: Self-pay | Admitting: Emergency Medicine

## 2017-11-10 VITALS — BP 120/72 | HR 92 | Temp 98.9°F | Resp 18 | Ht 68.0 in | Wt 191.6 lb

## 2017-11-10 DIAGNOSIS — J029 Acute pharyngitis, unspecified: Secondary | ICD-10-CM

## 2017-11-10 LAB — POCT RAPID STREP A (OFFICE): Rapid Strep A Screen: NEGATIVE

## 2017-11-10 MED ORDER — MAGIC MOUTHWASH W/LIDOCAINE
5.0000 mL | Freq: Four times a day (QID) | ORAL | 0 refills | Status: DC | PRN
Start: 2017-11-10 — End: 2018-06-01

## 2017-11-10 NOTE — Progress Notes (Signed)
Name: Andrea Pope   MRN: [id number]    DOB: [birth year]   Date:11/10/2017       Progress Note  Subjective  Chief Complaint  Chief Complaint  Patient presents with  . Sore Throat    low grade fever 99.7 for 3 days    HPI  PT presents with concern for sore throat for 3 days with low grade fever at home. She has been drinking plenty of fluids and resting.  She endorses nausea, some stomach cramps, fatigue.  Denies vomiting, diarrhea, coughing, chest pain, shortness of breath, sinus pain/pressure, swallowing difficulty swallowing, body aches.  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  Patient Active Problem List   Diagnosis Date Noted  . Status post hysteroscopy/D&C with NovaSure endometrial ablation-postop check 04/02/2017  . Status post D&C/hysteroscopy/endometrial ablation 03/24/2017  . Overweight (BMI 25.0-29.9) 11/19/2016  . Acute thoracic back pain 07/23/2016  . Neutropenia (HCC) 07/23/2016  . White coat hypertension 01/25/2016  . Dyspareunia, female 01/03/2016  . Dysmenorrhea 01/03/2016  . Menorrhagia with regular cycle 01/03/2016  . High cholesterol 01/01/2016  . Preventative health care 12/08/2015  . Gallstones 12/05/2015  . Irregular periods/menstrual cycles 11/29/2015  . Abdominal pain 11/29/2015  . Ovarian cyst 11/29/2015  . History of ectopic pregnancy 11/29/2015  . Breast cancer screening 09/21/2015  . Proteinuria 08/14/2015  . Right knee sprain 07/19/2015  . Abnormal thyroid stimulating hormone (TSH) level 01/10/2015  . Allergic rhinitis, seasonal 01/10/2015  . History of anemia 01/10/2015  . Excessive and frequent menstruation with irregular cycle 01/10/2015  . Asymptomatic varicose veins of both lower extremities 01/10/2015  . Neck pain, musculoskeletal 01/10/2015  . Hand numbness 09/28/2014  . Hand weakness 09/28/2014    Social History   Tobacco Use  . Smoking status: Never Smoker  . Smokeless tobacco: Never Used   Substance Use Topics  . Alcohol use: No    Alcohol/week: 0.0 oz     Current Outpatient Medications:  .  ibuprofen (ADVIL,MOTRIN) 800 MG tablet, Take 1 tablet (800 mg total) by mouth every 8 (eight) hours as needed. (Patient taking differently: Take 800 mg every 8 (eight) hours as needed by mouth (for pain.). ), Disp: 50 tablet, Rfl: 1 .  magic mouthwash w/lidocaine SOLN, Take 5 mLs by mouth 4 (four) times daily as needed for mouth pain., Disp: 100 mL, Rfl: 0  No Known Allergies  ROS  Ten systems reviewed and is negative except as mentioned in HPI.  Objective  Vitals:   11/10/17 1119  BP: 120/72  Pulse: 92  Resp: 18  Temp: 98.9 F (37.2 C)  TempSrc: Oral  SpO2: 97%  Weight: 191 lb 9.6 oz (86.9 kg)  Height: 5\' 8"  (1.727 m)   Body mass index is 29.13 kg/m.  Nursing Note and Vital Signs reviewed.  Physical Exam  Constitutional: Patient appears well-developed and well-nourished. Obese. No distress.  HEENT: head atraumatic, normocephalic, pupils equal and reactive to light, EOM's intact, TM's without erythema or bulging, no maxillary or frontal sinus tenderness, neck supple with mild bilateral submandibular lymphadenopathy, oropharynx mildly erythematous and moist without exudate. Cardiovascular: Normal rate, regular rhythm, S1/S2 present.  No murmur or rub heard. No BLE edema. Pulmonary/Chest: Effort normal and breath sounds clear. No respiratory distress or retractions. Psychiatric: Patient has a normal mood and affect. behavior is normal. Judgment and thought content normal.  Results for orders placed or performed in visit on 11/10/17 (from the past 72 hour(s))  POCT rapid strep A     Status: None   Collection Time: 11/10/17 11:42 AM  Result Value Ref Range   Rapid Strep A Screen Negative Negative    Assessment & Plan  1. Sore throat - POCT rapid strep A - negative - Culture, Group A Strep - magic mouthwash w/lidocaine SOLN; Take 5 mLs by mouth 4 (four) times  daily as needed for mouth pain.  Dispense: 100 mL; Refill: 0 - Work note for today and 11/11/17 is provided. Advised if not improving by 11/12/17, she should return for re-evaluation.   -Red flags and when to present for emergency care or RTC including fever >101.34F, chest pain, shortness of breath, new/worsening/un-resolving symptoms, difficulty swallowing, reviewed with patient at time of visit. Follow up and care instructions discussed and provided in AVS.

## 2017-11-12 ENCOUNTER — Encounter: Payer: Self-pay | Admitting: Family Medicine

## 2017-11-12 LAB — CULTURE, GROUP A STREP
MICRO NUMBER:: 90806854
SPECIMEN QUALITY: ADEQUATE

## 2017-11-24 ENCOUNTER — Encounter: Payer: Medicaid Other | Admitting: Nurse Practitioner

## 2017-12-05 ENCOUNTER — Ambulatory Visit (INDEPENDENT_AMBULATORY_CARE_PROVIDER_SITE_OTHER): Payer: Managed Care, Other (non HMO) | Admitting: Nurse Practitioner

## 2017-12-05 ENCOUNTER — Encounter: Payer: Self-pay | Admitting: Nurse Practitioner

## 2017-12-05 VITALS — BP 140/90 | HR 66 | Temp 98.0°F | Resp 16 | Ht 68.0 in | Wt 187.6 lb

## 2017-12-05 DIAGNOSIS — L819 Disorder of pigmentation, unspecified: Secondary | ICD-10-CM

## 2017-12-05 DIAGNOSIS — Z23 Encounter for immunization: Secondary | ICD-10-CM

## 2017-12-05 DIAGNOSIS — Z0000 Encounter for general adult medical examination without abnormal findings: Secondary | ICD-10-CM

## 2017-12-05 DIAGNOSIS — R030 Elevated blood-pressure reading, without diagnosis of hypertension: Secondary | ICD-10-CM

## 2017-12-05 DIAGNOSIS — R7989 Other specified abnormal findings of blood chemistry: Secondary | ICD-10-CM | POA: Diagnosis not present

## 2017-12-05 DIAGNOSIS — Z1231 Encounter for screening mammogram for malignant neoplasm of breast: Secondary | ICD-10-CM

## 2017-12-05 DIAGNOSIS — E7800 Pure hypercholesterolemia, unspecified: Secondary | ICD-10-CM

## 2017-12-05 DIAGNOSIS — Z1239 Encounter for other screening for malignant neoplasm of breast: Secondary | ICD-10-CM

## 2017-12-05 DIAGNOSIS — M6281 Muscle weakness (generalized): Secondary | ICD-10-CM

## 2017-12-05 NOTE — Patient Instructions (Signed)
- Cut down on salt, avoid NSAIDs like ibuprofen, naproxen, advil, aleve- so we can get your blood pressures at goal. Goal is be under 130/80 but we would medically treat over 140/80.   DASH Eating Plan DASH stands for "Dietary Approaches to Stop Hypertension." The DASH eating plan is a healthy eating plan that has been shown to reduce high blood pressure (hypertension). It may also reduce your risk for type 2 diabetes, heart disease, and stroke. The DASH eating plan may also help with weight loss. What are tips for following this plan? General guidelines  Avoid eating more than 2,300 mg (milligrams) of salt (sodium) a day. If you have hypertension, you may need to reduce your sodium intake to 1,500 mg a day.  Limit alcohol intake to no more than 1 drink a day for nonpregnant women and 2 drinks a day for men. One drink equals 12 oz of beer, 5 oz of wine, or 1 oz of hard liquor.  Work with your health care provider to maintain a healthy body weight or to lose weight. Ask what an ideal weight is for you.  Get at least 30 minutes of exercise that causes your heart to beat faster (aerobic exercise) most days of the week. Activities may include walking, swimming, or biking.  Work with your health care provider or diet and nutrition specialist (dietitian) to adjust your eating plan to your individual calorie needs. Reading food labels  Check food labels for the amount of sodium per serving. Choose foods with less than 5 percent of the Daily Value of sodium. Generally, foods with less than 300 mg of sodium per serving fit into this eating plan.  To find whole grains, look for the word "whole" as the first word in the ingredient list. Shopping  Buy products labeled as "low-sodium" or "no salt added."  Buy fresh foods. Avoid canned foods and premade or frozen meals. Cooking  Avoid adding salt when cooking. Use salt-free seasonings or herbs instead of table salt or sea salt. Check with your health  care provider or pharmacist before using salt substitutes.  Do not fry foods. Cook foods using healthy methods such as baking, boiling, grilling, and broiling instead.  Cook with heart-healthy oils, such as olive, canola, soybean, or sunflower oil. Meal planning   Eat a balanced diet that includes: ? 5 or more servings of fruits and vegetables each day. At each meal, try to fill half of your plate with fruits and vegetables. ? Up to 6-8 servings of whole grains each day. ? Less than 6 oz of lean meat, poultry, or fish each day. A 3-oz serving of meat is about the same size as a deck of cards. One egg equals 1 oz. ? 2 servings of low-fat dairy each day. ? A serving of nuts, seeds, or beans 5 times each week. ? Heart-healthy fats. Healthy fats called Omega-3 fatty acids are found in foods such as flaxseeds and coldwater fish, like sardines, salmon, and mackerel.  Limit how much you eat of the following: ? Canned or prepackaged foods. ? Food that is high in trans fat, such as fried foods. ? Food that is high in saturated fat, such as fatty meat. ? Sweets, desserts, sugary drinks, and other foods with added sugar. ? Full-fat dairy products.  Do not salt foods before eating.  Try to eat at least 2 vegetarian meals each week.  Eat more home-cooked food and less restaurant, buffet, and fast food.  When eating at  a restaurant, ask that your food be prepared with less salt or no salt, if possible. What foods are recommended? The items listed may not be a complete list. Talk with your dietitian about what dietary choices are best for you. Grains Whole-grain or whole-wheat bread. Whole-grain or whole-wheat pasta. Brown rice. [place]. Bulgur. Whole-grain and low-sodium cereals. Pita bread. Low-fat, low-sodium crackers. Whole-wheat flour tortillas. Vegetables Fresh or frozen vegetables (raw, steamed, roasted, or grilled). Low-sodium or reduced-sodium tomato and vegetable juice.  Low-sodium or reduced-sodium tomato sauce and tomato paste. Low-sodium or reduced-sodium canned vegetables. Fruits All fresh, dried, or frozen fruit. Canned fruit in natural juice (without added sugar). Meat and other protein foods Skinless chicken or [place]. Ground chicken or [place]. Pork with fat trimmed off. Fish and seafood. Egg whites. Dried beans, peas, or lentils. Unsalted nuts, nut butters, and seeds. Unsalted canned beans. Lean cuts of beef with fat trimmed off. Low-sodium, lean deli meat. Dairy Low-fat (1%) or fat-free (skim) milk. Fat-free, low-fat, or reduced-fat cheeses. Nonfat, low-sodium ricotta or cottage cheese. Low-fat or nonfat yogurt. Low-fat, low-sodium cheese. Fats and oils Soft margarine without trans fats. Vegetable oil. Low-fat, reduced-fat, or light mayonnaise and salad dressings (reduced-sodium). Canola, safflower, olive, soybean, and sunflower oils. Avocado. Seasoning and other foods Herbs. Spices. Seasoning mixes without salt. Unsalted popcorn and pretzels. Fat-free sweets. What foods are not recommended? The items listed may not be a complete list. Talk with your dietitian about what dietary choices are best for you. Grains Baked goods made with fat, such as croissants, muffins, or some breads. Dry pasta or rice meal packs. Vegetables Creamed or fried vegetables. Vegetables in a cheese sauce. Regular canned vegetables (not low-sodium or reduced-sodium). Regular canned tomato sauce and paste (not low-sodium or reduced-sodium). Regular tomato and vegetable juice (not low-sodium or reduced-sodium). Angie Fava. Olives. Fruits Canned fruit in a light or heavy syrup. Fried fruit. Fruit in cream or butter sauce. Meat and other protein foods Fatty cuts of meat. Ribs. Fried meat. Berniece Salines. Sausage. Bologna and other processed lunch meats. Salami. Fatback. Hotdogs. Bratwurst. Salted nuts and seeds. Canned beans with added salt. Canned or smoked fish. Whole eggs or egg yolks. Chicken  or [place] with skin. Dairy Whole or 2% milk, cream, and half-and-half. Whole or full-fat cream cheese. Whole-fat or sweetened yogurt. Full-fat cheese. Nondairy creamers. Whipped toppings. Processed cheese and cheese spreads. Fats and oils Butter. Stick margarine. Lard. Shortening. Ghee. Bacon fat. Tropical oils, such as coconut, palm kernel, or palm oil. Seasoning and other foods Salted popcorn and pretzels. Onion salt, garlic salt, seasoned salt, table salt, and sea salt. Worcestershire sauce. Tartar sauce. Barbecue sauce. Teriyaki sauce. Soy sauce, including reduced-sodium. Steak sauce. Canned and packaged gravies. Fish sauce. Oyster sauce. Cocktail sauce. Horseradish that you find on the shelf. Ketchup. Mustard. Meat flavorings and tenderizers. Bouillon cubes. Hot sauce and Tabasco sauce. Premade or packaged marinades. Premade or packaged taco seasonings. Relishes. Regular salad dressings. Where to find more information:  National Heart, Lung, and [place]: https://wilson-eaton.com/  American Heart Association: www.heart.org Summary  The DASH eating plan is a healthy eating plan that has been shown to reduce high blood pressure (hypertension). It may also reduce your risk for type 2 diabetes, heart disease, and stroke.  With the DASH eating plan, you should limit salt (sodium) intake to 2,300 mg a day. If you have hypertension, you may need to reduce your sodium intake to 1,500 mg a day.  When on the DASH eating plan, aim to eat  more fresh fruits and vegetables, whole grains, lean proteins, low-fat dairy, and heart-healthy fats.  Work with your health care provider or diet and nutrition specialist (dietitian) to adjust your eating plan to your individual calorie needs. This information is not intended to replace advice given to you by your health care provider. Make sure you discuss any questions you have with your health care provider. Document Released: 04/11/2011 Document Revised:  04/15/2016 Document Reviewed: 04/15/2016 Elsevier Interactive Patient Education  [employer].

## 2017-12-05 NOTE — Progress Notes (Signed)
Name: Andrea Pope   MRN: [id number]    DOB: [birth year]   Date:12/05/2017       Progress Note  Subjective  Chief Complaint  Chief Complaint  Patient presents with  . Annual Exam    HPI  Patient presents for annual CPE.  Patient endorses stiffness and weakness intermittently in bilateral hands ongoing for the past year. States sometimes its hard her to swipe her credit card in machines. Notices it daily, mostly in the mornings. Has noticed it has been more frequent. Denies hand pain, numbness or tingling. Hasn't noted aggravating or alleviating factors. Has had similar episode in the past over 5 years ago lasting for days and then self resolved until this past year.   Diet:  Started keto last week and it has been great for her; lost ten pounds since last week. Good energy levels. Eating a lot of lean meats, broccoli, cauliflower, strawberries, nuts, and staying hydrated. States has done this before.  Drinking at least 4 cup of water, and sugar free tea and coffee, pickle juice,  Exercise:  Active at work; 2-3 days a week 30 minutes of walking.   USPSTF grade A and B recommendations   Depression:  Depression screen [facility] 2/9 12/05/2017 03/14/2017 11/19/2016 08/02/2016 07/23/2016  Decreased Interest 0 0 0 0 0  Down, Depressed, Hopeless 0 0 0 0 0  PHQ - 2 Score 0 0 0 0 0   Hypertension: BP Readings from Last 3 Encounters:  12/05/17 140/84  11/10/17 120/72  04/02/17 129/78   Obesity: Wt Readings from Last 3 Encounters:  12/05/17 187 lb 9.6 oz (85.1 kg)  11/10/17 191 lb 9.6 oz (86.9 kg)  04/02/17 191 lb (86.6 kg)   BMI Readings from Last 3 Encounters:  12/05/17 28.52 kg/m  11/10/17 29.13 kg/m  04/02/17 29.04 kg/m   Married  Hep C Screening: declines  STD testing and prevention (HIV/chl/gon/syphilis): declines  Intimate partner violence: denies  Sexual History/Pain during Intercourse: denies  Menstrual History/LMP/Abnormal Bleeding: no longer having periods- had ablation  03/2017 due to abnormal bleeding.  Incontinence Symptoms: has some symptoms of stress incontinence, able to deal with it, GYN said not bad enough for bladder tac  Advanced Care Planning: A voluntary discussion about advance care planning including the explanation and discussion of advance directives.  Discussed health care proxy and Living will, and the patient was able to identify a health care proxy as  Husband Manasvi Dickard.  Patient does not have a living will at present time. If patient does have living will, I have requested they bring this to the clinic to be scanned in to their chart.  Breast cancer:  HM Mammogram  Date Value Ref Range Status  05/06/2012 Normal  Final    BRCA gene screening: No personal of family history of breast or ovarian cancer. Father, uncles and grandfather had prostate cancer- Cervical cancer screening: sees GYN   Osteoporosis Screening: does not qualify, discussed No results found for: HMDEXASCAN  Lipids:  Lab Results  Component Value Date   CHOL 205 (H) 11/19/2016   CHOL 213 (H) 07/23/2016   CHOL 172 12/11/2015   Lab Results  Component Value Date   HDL 60 11/19/2016   HDL 60 07/23/2016   HDL 56 12/11/2015   Lab Results  Component Value Date   LDLCALC 124 (H) 11/19/2016   LDLCALC 136 (H) 07/23/2016   LDLCALC 94 12/11/2015   Lab Results  Component Value Date   TRIG 103 11/19/2016  TRIG 83 07/23/2016   TRIG 108 12/11/2015   Lab Results  Component Value Date   CHOLHDL 3.4 11/19/2016   CHOLHDL 3.6 07/23/2016   CHOLHDL 3.1 12/11/2015   No results found for: LDLDIRECT  Glucose:  Glucose, Bld  Date Value Ref Range Status  11/19/2016 100 (H) 65 - 99 mg/dL Final  07/23/2016 73 65 - 99 mg/dL Final  11/29/2015 84 65 - 99 mg/dL Final    Skin cancer: no personal or family history, doesn't use sunscreen, not out in the sun a lot  Colorectal cancer: Denies personal or family history  Lung cancer:  Does not qualify    Patient Active Problem  List   Diagnosis Date Noted  . Status post hysteroscopy/D&C with NovaSure endometrial ablation-postop check 04/02/2017  . Status post D&C/hysteroscopy/endometrial ablation 03/24/2017  . Overweight (BMI 25.0-29.9) 11/19/2016  . Acute thoracic back pain 07/23/2016  . Neutropenia ([place]) 07/23/2016  . White coat hypertension 01/25/2016  . Dyspareunia, female 01/03/2016  . Dysmenorrhea 01/03/2016  . Menorrhagia with regular cycle 01/03/2016  . High cholesterol 01/01/2016  . Preventative health care 12/08/2015  . Gallstones 12/05/2015  . Irregular periods/menstrual cycles 11/29/2015  . Abdominal pain 11/29/2015  . Ovarian cyst 11/29/2015  . History of ectopic pregnancy 11/29/2015  . Breast cancer screening 09/21/2015  . Proteinuria 08/14/2015  . Right knee sprain 07/19/2015  . Abnormal thyroid stimulating hormone (TSH) level 01/10/2015  . Allergic rhinitis, seasonal 01/10/2015  . History of anemia 01/10/2015  . Excessive and frequent menstruation with irregular cycle 01/10/2015  . Asymptomatic varicose veins of both lower extremities 01/10/2015  . Neck pain, musculoskeletal 01/10/2015  . Hand numbness 09/28/2014  . Hand weakness 09/28/2014    Past Surgical History:  Procedure Laterality Date  . CHOLECYSTECTOMY N/A 01/19/2016   Procedure: LAPAROSCOPIC CHOLECYSTECTOMY WITH INTRAOPERATIVE CHOLANGIOGRAM;  Surgeon: Christene Lye, MD;  Location: [facility] ORS;  Service: General;  Laterality: N/A;  . DILITATION & CURRETTAGE/HYSTROSCOPY WITH NOVASURE ABLATION N/A 03/24/2017   Procedure: DILATATION & CURETTAGE/HYSTEROSCOPY WITH NOVASURE ABLATION;  Surgeon: Brayton Mars, MD;  Location: [facility] ORS;  Service: Gynecology;  Laterality: N/A;  . laser surgery on eye Right 10/2016    Family History  Problem Relation Age of Onset  . Hypertension Mother   . Thyroid disease Mother   . Kidney disease Mother   . Hypertension Father   . Cancer Father        Prostate  . Diabetes Brother     . Diabetes Other   . Cancer Paternal Uncle        Prostate  . Diabetes Maternal Grandmother   . Cancer Paternal Grandfather        Prostate  . Diabetes Sister   . Schizophrenia Sister   . Breast cancer Neg Hx   . Ovarian cancer Neg Hx   . Heart disease Neg Hx     Social History   Socioeconomic History  . Marital status: Married    Spouse name: Not on file  . Number of children: Not on file  . Years of education: Not on file  . Highest education level: Not on file  Occupational History  . Not on file  Social Needs  . Financial resource strain: Not on file  . Food insecurity:    Worry: Not on file    Inability: Not on file  . Transportation needs:    Medical: Not on file    Non-medical: Not on file  Tobacco Use  . Smoking  status: Never Smoker  . Smokeless tobacco: Never Used  Substance and Sexual Activity  . Alcohol use: No    Alcohol/week: 0.0 oz  . Drug use: No  . Sexual activity: Yes    Partners: Male    Birth control/protection: None  Lifestyle  . Physical activity:    Days per week: Not on file    Minutes per session: Not on file  . Stress: Not on file  Relationships  . Social connections:    Talks on phone: Not on file    Gets together: Not on file    Attends religious service: Not on file    Active member of club or organization: Not on file    Attends meetings of clubs or organizations: Not on file    Relationship status: Not on file  . Intimate partner violence:    Fear of current or ex partner: Not on file    Emotionally abused: Not on file    Physically abused: Not on file    Forced sexual activity: Not on file  Other Topics Concern  . Not on file  Social History Narrative  . Not on file     Current Outpatient Medications:  .  ibuprofen (ADVIL,MOTRIN) 800 MG tablet, Take 1 tablet (800 mg total) by mouth every 8 (eight) hours as needed. (Patient taking differently: Take 800 mg every 8 (eight) hours as needed by mouth (for pain.). ), Disp:  50 tablet, Rfl: 1 .  magic mouthwash w/lidocaine SOLN, Take 5 mLs by mouth 4 (four) times daily as needed for mouth pain., Disp: 100 mL, Rfl: 0  No Known Allergies   Review of Systems  Constitutional: Positive for weight loss (10 pounds with keto diet). Negative for chills, fever and malaise/fatigue.  HENT: Negative for congestion, sinus pain and sore throat.   Eyes: Positive for blurred vision (corrected with eye glasses, due for annual optometry visit). Negative for double vision.  Respiratory: Negative for cough and shortness of breath.   Cardiovascular: Negative for chest pain, palpitations and leg swelling.  Gastrointestinal: Negative for abdominal pain, blood in stool, constipation, diarrhea, nausea and vomiting.  Genitourinary: Negative for dysuria and hematuria.  Musculoskeletal: Negative for joint pain and myalgias.  Skin: Negative for rash.  Neurological: Positive for weakness (weakness and stiffness intermittently in bilateral hands). Negative for dizziness, tingling, sensory change, speech change and headaches.  Endo/Heme/Allergies: Negative for polydipsia. Does not bruise/bleed easily.  Psychiatric/Behavioral: Negative for depression. The patient is not nervous/anxious and does not have insomnia.     Objective  Vitals:   12/05/17 0925  BP: 140/84  Pulse: 66  Resp: 16  Temp: 98 F (36.7 C)  TempSrc: Oral  SpO2: 100%  Weight: 187 lb 9.6 oz (85.1 kg)  Height: 5' 8" (1.727 m)    Body mass index is 28.52 kg/m.  Physical Exam  Constitutional: She is oriented to person, place, and time. She appears well-developed and well-nourished. She is cooperative.  HENT:  Head: Normocephalic.    Right Ear: Hearing normal.  Left Ear: Hearing normal.  Nose: Nose normal.  Mouth/Throat: Oropharynx is clear and moist and mucous membranes are normal.  Cardiovascular: Normal rate, regular rhythm and normal heart sounds.  No lower extremity edema noted.  Pulmonary/Chest: Effort  normal and breath sounds normal.  Abdominal: Soft. Normal appearance and bowel sounds are normal.  Neurological: She is alert and oriented to person, place, and time. She has normal strength. No cranial nerve deficit or sensory deficit.   She exhibits normal muscle tone.  Patient notes subjective weakness in bilateral hands   Skin: Skin is warm and dry. No rash noted.  Psychiatric: She has a normal mood and affect. Her speech is normal and behavior is normal. Thought content normal.        Fall Risk: Fall Risk  12/05/2017 03/14/2017 11/19/2016 08/02/2016 07/23/2016  Falls in the past year? _0      Functional Status Survey: Is the patient deaf or have difficulty hearing?: No Does the patient have difficulty seeing, even when wearing glasses/contacts?: No Does the patient have difficulty concentrating, remembering, or making decisions?: No Does the patient have difficulty walking or climbing stairs?: No Does the patient have difficulty dressing or bathing?: No Does the patient have difficulty doing errands alone such as visiting a doctor's office or shopping?: No   Assessment & Plan 1. Preventative health care -discussed healthy living, prevention of hypertension, osteoporosis and other chronic disease  2. Need for Tdap vaccination - Tdap vaccine greater than or equal to 7yo IM  3. High cholesterol Doing keto - Lipid Profile  4. Abnormal thyroid stimulating hormone (TSH) level - TSH  5. Hand muscle weakness Discussed possible neurology referral; follow up in 3 weeks  - Antinuclear Antib (ANA) - C-reactive protein - COMPLETE METABOLIC PANEL WITH GFR - TSH  6. Hyperpigmentation of skin of cheek - Ambulatory referral to Dermatology  7. Screening for breast cancer - MM Digital Screening; Future  8. Elevated blood pressure reading Discussed DASH, plans to reduce salt in diet, continue weight loss, follow-up in 3 weeks   -USPSTF grade A and B recommendations  reviewed with patient; age-appropriate recommendations, preventive care, screening tests, etc discussed and encouraged; healthy living encouraged; see AVS for patient education given to patient -Discussed importance of 150 minutes of physical activity weekly, eat two servings of fish weekly, eat one serving of tree nuts ( cashews, pistachios, pecans, almonds.Marland Kitchen) every other day, eat 6 servings of fruit/vegetables daily and drink plenty of water and avoid sweet beverages.   -Reviewed Health Maintenance: tdap; will schedule papsmear with OBGYN

## 2017-12-09 ENCOUNTER — Other Ambulatory Visit: Payer: Self-pay

## 2017-12-09 ENCOUNTER — Other Ambulatory Visit: Payer: Self-pay | Admitting: Nurse Practitioner

## 2017-12-09 DIAGNOSIS — R768 Other specified abnormal immunological findings in serum: Secondary | ICD-10-CM

## 2017-12-09 NOTE — Progress Notes (Signed)
Done

## 2017-12-10 LAB — ANTI-NUCLEAR AB-TITER (ANA TITER): ANA Titer 1: 1:320 {titer} — ABNORMAL HIGH

## 2017-12-10 LAB — TSH: TSH: 0.61 mIU/L

## 2017-12-10 LAB — CYCLIC CITRUL PEPTIDE ANTIBODY, IGG: Cyclic Citrullin Peptide Ab: <16 UNITS

## 2017-12-10 LAB — TEST AUTHORIZATION

## 2017-12-10 LAB — COMPLETE METABOLIC PANEL WITH GFR
AG Ratio: 1.6 (calc) (ref 1.0–2.5)
ALKALINE PHOSPHATASE (APISO): 46 U/L (ref 33–115)
ALT: 14 U/L (ref 6–29)
AST: 15 U/L (ref 10–35)
Albumin: 4.5 g/dL (ref 3.6–5.1)
BUN: 13 mg/dL (ref 7–25)
CHLORIDE: 102 mmol/L (ref 98–110)
CO2: 25 mmol/L (ref 20–32)
CREATININE: 0.82 mg/dL (ref 0.50–1.10)
Calcium: 9.3 mg/dL (ref 8.6–10.2)
GFR, Est African American: 100 mL/min/{1.73_m2} (ref >=60)
GFR, Est Non African American: 86 mL/min/{1.73_m2} (ref >=60)
GLUCOSE: 79 mg/dL (ref 65–99)
Globulin: 2.9 g/dL (calc) (ref 1.9–3.7)
Potassium: 4 mmol/L (ref 3.5–5.3)
SODIUM: 136 mmol/L (ref 135–146)
Total Bilirubin: 0.6 mg/dL (ref 0.2–1.2)
Total Protein: 7.4 g/dL (ref 6.1–8.1)

## 2017-12-10 LAB — LIPID PANEL
CHOL/HDL RATIO: 3.7 (calc) (ref <5.0)
Cholesterol: 236 mg/dL — ABNORMAL HIGH (ref <200)
HDL: 64 mg/dL (ref >50)
LDL Cholesterol (Calc): 158 mg/dL (calc) — ABNORMAL HIGH
Non-HDL Cholesterol (Calc): 172 mg/dL (calc) — ABNORMAL HIGH (ref <130)
Triglycerides: 51 mg/dL (ref <150)

## 2017-12-10 LAB — ANA: Anti Nuclear Antibody(ANA): POSITIVE — AB

## 2017-12-10 LAB — C-REACTIVE PROTEIN: CRP: 1.5 mg/L (ref <8.0)

## 2017-12-12 NOTE — Progress Notes (Signed)
There are resulted orders in the system for ccp lab. Do want them to run it again off the same blood.

## 2017-12-15 DIAGNOSIS — R768 Other specified abnormal immunological findings in serum: Secondary | ICD-10-CM | POA: Insufficient documentation

## 2017-12-15 DIAGNOSIS — M2550 Pain in unspecified joint: Secondary | ICD-10-CM | POA: Insufficient documentation

## 2017-12-15 DIAGNOSIS — R0609 Other forms of dyspnea: Secondary | ICD-10-CM | POA: Insufficient documentation

## 2017-12-26 ENCOUNTER — Ambulatory Visit: Payer: Managed Care, Other (non HMO) | Admitting: Family Medicine

## 2017-12-26 ENCOUNTER — Encounter: Payer: Self-pay | Admitting: Family Medicine

## 2017-12-26 VITALS — BP 126/82 | HR 82 | Temp 98.3°F | Ht 68.0 in | Wt 196.6 lb

## 2017-12-26 DIAGNOSIS — R768 Other specified abnormal immunological findings in serum: Secondary | ICD-10-CM | POA: Diagnosis not present

## 2017-12-26 DIAGNOSIS — E7800 Pure hypercholesterolemia, unspecified: Secondary | ICD-10-CM | POA: Diagnosis not present

## 2017-12-26 DIAGNOSIS — Z23 Encounter for immunization: Secondary | ICD-10-CM

## 2017-12-26 DIAGNOSIS — R200 Anesthesia of skin: Secondary | ICD-10-CM

## 2017-12-26 NOTE — Assessment & Plan Note (Signed)
Being worked up by rheumatologist

## 2017-12-26 NOTE — Patient Instructions (Signed)
Try to limit saturated fats in your diet (bologna, hot dogs, barbeque, cheeseburgers, hamburgers, steak, bacon, sausage, cheese, etc.) and get more fresh fruits, vegetables, and whole grains Return around November 15th or so and have fasting labs done

## 2017-12-26 NOTE — Progress Notes (Signed)
BP 126/82   Pulse 82   Temp 98.3 F (36.8 C)   Ht 5\' 8"  (1.727 m)   Wt 196 lb 9.6 oz (89.2 kg)   SpO2 96%   BMI 29.89 kg/m    Subjective:    Patient ID: Andrea Pope, female    DOB: February 06, 1971, 47 y.o.   MRN: [id number]  HPI: Andrea Pope is a 47 y.o. female  Chief Complaint  Patient presents with  . Follow-up    HPI Patient is here for follow-up She has her physical with Sharyon Cable, DNP on August 2nd At that visit, she mentioned stiffness and weakness in her hands, bilaterally; past year Some swelling; worse when weather is cold She had a positive ANA and was referred to rheumatologist She saw Dr. Tresa Moore on August 12th She drew more blood She did not start her on any medicines Has follow-up with her soon after labs and going to get more testing Had chest xray done Aug 12th Had cardiology scan on August 14th No results available  High cholesterol; she was doing keto at the time; she has stopped since then Diet is now low in fried foods  Depression screen [facility] 2/9 12/26/2017 12/05/2017 03/14/2017 11/19/2016 08/02/2016  Decreased Interest 0 0 0 0 0  Down, Depressed, Hopeless 0 0 0 0 0  PHQ - 2 Score 0 0 0 0 0    Relevant past medical, surgical, family and social history reviewed Past Medical History:  Diagnosis Date  . Abnormal thyroid stimulating hormone level   . Allergy   . Anemia    iron deficiency  . Encounter for initial prescription of injectable contraceptive   . Female stress incontinence   . High cholesterol   . History of ectopic pregnancy 11/29/2015  . Menorrhagia with irregular cycle   . Ovarian cyst 11/29/2015  . Spider veins of both lower extremities    Past Surgical History:  Procedure Laterality Date  . CHOLECYSTECTOMY N/A 01/19/2016   Procedure: LAPAROSCOPIC CHOLECYSTECTOMY WITH INTRAOPERATIVE CHOLANGIOGRAM;  Surgeon: Kieth Brightly, MD;  Location: [facility] ORS;  Service: General;  Laterality: N/A;  . DILITATION &  CURRETTAGE/HYSTROSCOPY WITH NOVASURE ABLATION N/A 03/24/2017   Procedure: DILATATION & CURETTAGE/HYSTEROSCOPY WITH NOVASURE ABLATION;  Surgeon: Herold Harms, MD;  Location: [facility] ORS;  Service: Gynecology;  Laterality: N/A;  . laser surgery on eye Right 10/2016   Family History  Problem Relation Age of Onset  . Hypertension Mother   . Thyroid disease Mother   . Kidney disease Mother   . Hypertension Father   . Cancer Father        Prostate  . Diabetes Brother   . Diabetes Other   . Cancer Paternal Uncle        Prostate  . Diabetes Maternal Grandmother   . Cancer Paternal Grandfather        Prostate  . Diabetes Sister   . Schizophrenia Sister   . Breast cancer Neg Hx   . Ovarian cancer Neg Hx   . Heart disease Neg Hx    Social History   Tobacco Use  . Smoking status: Never Smoker  . Smokeless tobacco: Never Used  Substance Use Topics  . Alcohol use: No    Alcohol/week: 0.0 standard drinks  . Drug use: No    Interim medical history since last visit reviewed. Allergies and medications reviewed  Review of Systems Per HPI unless specifically indicated above     Objective:    BP  126/82   Pulse 82   Temp 98.3 F (36.8 C)   Ht 5\' 8"  (1.727 m)   Wt 196 lb 9.6 oz (89.2 kg)   SpO2 96%   BMI 29.89 kg/m   Wt Readings from Last 3 Encounters:  12/26/17 196 lb 9.6 oz (89.2 kg)  12/05/17 187 lb 9.6 oz (85.1 kg)  11/10/17 191 lb 9.6 oz (86.9 kg)    Physical Exam  Constitutional: She appears well-developed and well-nourished. No distress.  HENT:  Mouth/Throat: Mucous membranes are normal.  Eyes: EOM are normal. No scleral icterus.  Cardiovascular: Normal rate and regular rhythm.  Pulmonary/Chest: Effort normal and breath sounds normal.  Musculoskeletal:       Right hand: She exhibits no tenderness and no deformity.       Left hand: She exhibits no tenderness and no deformity.  Psychiatric: She has a normal mood and affect. Her behavior is normal.     Results for orders placed or performed in visit on 12/05/17  Antinuclear Antib (ANA)  Result Value Ref Range   Anti Nuclear Antibody(ANA) POSITIVE (A) NEGATIVE  C-reactive protein  Result Value Ref Range   CRP 1.5 <8.0 mg/L  COMPLETE METABOLIC PANEL WITH GFR  Result Value Ref Range   Glucose, Bld 79 65 - 99 mg/dL   BUN 13 7 - 25 mg/dL   Creat 8.65 7.84 - 6.96 mg/dL   GFR, Est Non African American 86 > OR = 60 mL/min/1.42m2   GFR, Est African American 100 > OR = 60 mL/min/1.18m2   BUN/Creatinine Ratio NOT APPLICABLE 6 - 22 (calc)   Sodium 136 135 - 146 mmol/L   Potassium 4.0 3.5 - 5.3 mmol/L   Chloride 102 98 - 110 mmol/L   CO2 25 20 - 32 mmol/L   Calcium 9.3 8.6 - 10.2 mg/dL   Total Protein 7.4 6.1 - 8.1 g/dL   Albumin 4.5 3.6 - 5.1 g/dL   Globulin 2.9 1.9 - 3.7 g/dL (calc)   AG Ratio 1.6 1.0 - 2.5 (calc)   Total Bilirubin 0.6 0.2 - 1.2 mg/dL   Alkaline phosphatase (APISO) 46 33 - 115 U/L   AST 15 10 - 35 U/L   ALT 14 6 - 29 U/L  Lipid Profile  Result Value Ref Range   Cholesterol 236 (H) <200 mg/dL   HDL 64 >29 mg/dL   Triglycerides 51 <528 mg/dL   LDL Cholesterol (Calc) 158 (H) mg/dL (calc)   Total CHOL/HDL Ratio 3.7 <5.0 (calc)   Non-HDL Cholesterol (Calc) 172 (H) <130 mg/dL (calc)  TSH  Result Value Ref Range   TSH 0.61 mIU/L  Anti-nuclear ab-titer (ANA titer)  Result Value Ref Range   ANA Pattern 1 HOMOGENEOUS (A)    ANA Titer 1 1:320 (H) titer  Cyclic citrul peptide antibody, IgG  Result Value Ref Range   Cyclic Citrullin Peptide Ab <41 UNITS  TEST AUTHORIZATION  Result Value Ref Range   TEST NAME: CYCLIC CITRULLINATED    TEST CODE: [id number]    CLIENT CONTACT: TISHA MCMURTRY    REPORT ALWAYS MESSAGE SIGNATURE        Assessment & Plan:   Problem List Items Addressed This Visit      Other   High cholesterol - Primary    Glad she is off of the keto diet; reviewed high LDL with her; she will return in about 3 months for recheck lipid panel       Relevant Orders   Lipid panel  Hand numbness    Being worked up by rheumatologist       Other Visit Diagnoses    Positive ANA (antinuclear antibody)       seeing rheumatologist; explained that there are many people who have false positives; rheum will be able to tell is indicative of actual disease   Need for influenza vaccination       offered and given today   Relevant Orders   Flu Vaccine QUAD 6+ mos PF IM (Fluarix Quad PF) (Completed)       Follow up plan: No follow-ups on file.  An after-visit summary was printed and given to the patient at check-out.  Please see the patient instructions which may contain other information and recommendations beyond what is mentioned above in the assessment and plan.  No orders of the defined types were placed in this encounter.   Orders Placed This Encounter  Procedures  . Flu Vaccine QUAD 6+ mos PF IM (Fluarix Quad PF)  . Lipid panel

## 2017-12-26 NOTE — Assessment & Plan Note (Signed)
Glad she is off of the keto diet; reviewed high LDL with her; she will return in about 3 months for recheck lipid panel

## 2018-01-08 DIAGNOSIS — E538 Deficiency of other specified B group vitamins: Secondary | ICD-10-CM | POA: Insufficient documentation

## 2018-01-08 DIAGNOSIS — E559 Vitamin D deficiency, unspecified: Secondary | ICD-10-CM | POA: Insufficient documentation

## 2018-01-09 ENCOUNTER — Ambulatory Visit (INDEPENDENT_AMBULATORY_CARE_PROVIDER_SITE_OTHER): Payer: Managed Care, Other (non HMO)

## 2018-01-09 DIAGNOSIS — R5383 Other fatigue: Secondary | ICD-10-CM | POA: Diagnosis not present

## 2018-01-09 DIAGNOSIS — R200 Anesthesia of skin: Secondary | ICD-10-CM

## 2018-01-09 DIAGNOSIS — D508 Other iron deficiency anemias: Secondary | ICD-10-CM | POA: Diagnosis not present

## 2018-01-09 DIAGNOSIS — D649 Anemia, unspecified: Secondary | ICD-10-CM | POA: Insufficient documentation

## 2018-01-09 MED ORDER — CYANOCOBALAMIN 1000 MCG/ML IJ SOLN
1000.0000 ug | Freq: Once | INTRAMUSCULAR | Status: AC
  Administered 2018-01-09: 1000 ug via INTRAMUSCULAR

## 2018-01-09 NOTE — Progress Notes (Signed)
Patient came in for her B-12 injection. She tolerated it well. NKDA.   

## 2018-01-14 ENCOUNTER — Encounter: Payer: Self-pay | Admitting: Family Medicine

## 2018-03-02 ENCOUNTER — Ambulatory Visit (INDEPENDENT_AMBULATORY_CARE_PROVIDER_SITE_OTHER): Payer: Managed Care, Other (non HMO)

## 2018-03-02 DIAGNOSIS — E538 Deficiency of other specified B group vitamins: Secondary | ICD-10-CM | POA: Diagnosis not present

## 2018-03-02 NOTE — Progress Notes (Signed)
Pt came in with own B-12 and syringe and needle, and needed instructions on how to give.  Prescribed by rheumatology

## 2018-03-20 ENCOUNTER — Ambulatory Visit: Payer: Managed Care, Other (non HMO) | Admitting: Family Medicine

## 2018-04-28 ENCOUNTER — Encounter: Payer: Self-pay | Admitting: Family Medicine

## 2018-04-28 ENCOUNTER — Ambulatory Visit: Payer: Managed Care, Other (non HMO) | Admitting: Family Medicine

## 2018-04-28 VITALS — BP 122/76 | HR 70 | Temp 98.3°F | Ht 68.0 in | Wt 192.6 lb

## 2018-04-28 DIAGNOSIS — E7800 Pure hypercholesterolemia, unspecified: Secondary | ICD-10-CM

## 2018-04-28 DIAGNOSIS — Z789 Other specified health status: Secondary | ICD-10-CM

## 2018-04-28 DIAGNOSIS — Z111 Encounter for screening for respiratory tuberculosis: Secondary | ICD-10-CM | POA: Diagnosis not present

## 2018-04-28 NOTE — Patient Instructions (Signed)
We'll get you labs today If you have not heard anything from my staff in a week about any orders/referrals/studies from today, please contact us here to follow-up [phone] We'll be glad to fill out your forms

## 2018-04-28 NOTE — Progress Notes (Signed)
BP 122/76   Pulse 70   Temp 98.3 F (36.8 C)   Ht 5' 8"  (1.727 m)   Wt 192 lb 9.6 oz (87.4 kg)   SpO2 99%   BMI 29.28 kg/m    Subjective:    Patient ID: Andrea Pope, female    DOB: Oct 30, 1970, 47 y.o.   MRN: [id number]  HPI: FRANKYE SCHWEGEL is a 47 y.o. female  No chief complaint on file.  MD note; chief complaint: need titers for school form  HPI Will be needing titers for work; she has a picture on her phone of a form from [employer] that requires titers for various communicable diseases She did not bring the actual form so nothing can be filled out right now  No cough or fever No unexplained weight loss No night sweats  Natural chicken pox around age 25 or 8  Does not think she had hep B vaccine  Does not think she has had Gardisil  Flu shot UTD Dec 26, 2017  Episode of pneumonia in 2006; she politely declined pneumonia vaccine  Dr. Annalee Genta gave her B12 shots; one more shot to go and then patient will ask if she can take pills or needs ongoing shots  High cholesterol She is also due to have her cholesterol panel rechecked   Depression screen [facility] 2/9 04/28/2018 12/26/2017 12/05/2017 03/14/2017 11/19/2016  Decreased Interest 0 0 0 0 0  Down, Depressed, Hopeless 0 0 0 0 0  PHQ - 2 Score 0 0 0 0 0  Altered sleeping 0 - - - -  Tired, decreased energy 0 - - - -  Change in appetite 0 - - - -  Feeling bad or failure about yourself  0 - - - -  Trouble concentrating 0 - - - -  Moving slowly or fidgety/restless 0 - - - -  Suicidal thoughts 0 - - - -  PHQ-9 Score 0 - - - -  Difficult doing work/chores Not difficult at all - - - -   Fall Risk  04/28/2018 12/26/2017 12/05/2017 03/14/2017 11/19/2016  Falls in the past year? 0 No No No No    Relevant past medical, surgical, family and social history reviewed Past Medical History:  Diagnosis Date  . Abnormal thyroid stimulating hormone level   . Allergy   . Anemia    iron deficiency  . Encounter for initial  prescription of injectable contraceptive   . Female stress incontinence   . High cholesterol   . History of ectopic pregnancy 11/29/2015  . Menorrhagia with irregular cycle   . Ovarian cyst 11/29/2015  . Spider veins of both lower extremities    Past Surgical History:  Procedure Laterality Date  . CHOLECYSTECTOMY N/A 01/19/2016   Procedure: LAPAROSCOPIC CHOLECYSTECTOMY WITH INTRAOPERATIVE CHOLANGIOGRAM;  Surgeon: Christene Lye, MD;  Location: [facility] ORS;  Service: General;  Laterality: N/A;  . DILITATION & CURRETTAGE/HYSTROSCOPY WITH NOVASURE ABLATION N/A 03/24/2017   Procedure: DILATATION & CURETTAGE/HYSTEROSCOPY WITH NOVASURE ABLATION;  Surgeon: Brayton Mars, MD;  Location: [facility] ORS;  Service: Gynecology;  Laterality: N/A;  . laser surgery on eye Right 10/2016   Family History  Problem Relation Age of Onset  . Hypertension Mother   . Thyroid disease Mother   . Kidney disease Mother   . Hypertension Father   . Cancer Father        Prostate  . Diabetes Brother   . Diabetes Other   . Cancer Paternal Uncle  Prostate  . Diabetes Maternal Grandmother   . Cancer Paternal Grandfather        Prostate  . Diabetes Sister   . Schizophrenia Sister   . Breast cancer Neg Hx   . Ovarian cancer Neg Hx   . Heart disease Neg Hx    Social History   Tobacco Use  . Smoking status: Never Smoker  . Smokeless tobacco: Never Used  Substance Use Topics  . Alcohol use: No    Alcohol/week: 0.0 standard drinks  . Drug use: No     Office Visit from 04/28/2018 in [facility]  AUDIT-C Score  0      Interim medical history since last visit reviewed. Allergies and medications reviewed  Review of Systems Per HPI unless specifically indicated above     Objective:    BP 122/76   Pulse 70   Temp 98.3 F (36.8 C)   Ht 5' 8"  (1.727 m)   Wt 192 lb 9.6 oz (87.4 kg)   SpO2 99%   BMI 29.28 kg/m   Wt Readings from Last 3 Encounters:  04/28/18 192 lb  9.6 oz (87.4 kg)  12/26/17 196 lb 9.6 oz (89.2 kg)  12/05/17 187 lb 9.6 oz (85.1 kg)    Physical Exam Constitutional:      Appearance: She is well-developed.  Eyes:     General: No scleral icterus. Cardiovascular:     Rate and Rhythm: Normal rate and regular rhythm.  Pulmonary:     Effort: Pulmonary effort is normal.     Breath sounds: Normal breath sounds.  Psychiatric:        Behavior: Behavior normal.       Assessment & Plan:   Problem List Items Addressed This Visit      Other   High cholesterol    Due to have this rechecked; getting today with other labs       Other Visit Diagnoses    Unknown varicella vaccination status    -  Primary   order IgG and complete form when resulted; she will get a copy of the actual form to fill out   Relevant Orders   Varicella zoster antibody, IgG   Measles, mumps, rubella (MMR) vaccination status unknown       IgG levels ordered today   Relevant Orders   Measles/Mumps/Rubella Immunity   Hepatitis B vaccination status unknown       labs ordered today   Relevant Orders   Hepatitis B surface antibody,qualitative   Hepatitis B surface antigen   Hepatitis B Core Antibody, total   Screening-pulmonary TB       quantiferon Au ordered today   Relevant Orders   QuantiFERON-TB Gold Plus       Follow up plan: No follow-ups on file.  An after-visit summary was printed and given to the patient at [facility].  Please see the patient instructions which may contain other information and recommendations beyond what is mentioned above in the assessment and plan.  No orders of the defined types were placed in this encounter.   Orders Placed This Encounter  Procedures  . Measles/Mumps/Rubella Immunity  . Varicella zoster antibody, IgG  . Hepatitis B surface antibody,qualitative  . Hepatitis B surface antigen  . Hepatitis B Core Antibody, total  . QuantiFERON-TB Gold Plus

## 2018-04-28 NOTE — Assessment & Plan Note (Signed)
Due to have this rechecked; getting today with other labs

## 2018-04-30 ENCOUNTER — Encounter: Payer: Self-pay | Admitting: Family Medicine

## 2018-04-30 DIAGNOSIS — E7800 Pure hypercholesterolemia, unspecified: Secondary | ICD-10-CM

## 2018-04-30 DIAGNOSIS — Z789 Other specified health status: Secondary | ICD-10-CM

## 2018-04-30 DIAGNOSIS — E663 Overweight: Secondary | ICD-10-CM

## 2018-05-01 LAB — QUANTIFERON-TB GOLD PLUS
Mitogen-NIL: >10 IU/mL
NIL: 0.02 IU/mL
QUANTIFERON-TB GOLD PLUS: NEGATIVE
TB1-NIL: 0.03 IU/mL
TB2-NIL: 0.01 [IU]/mL

## 2018-05-01 LAB — HEPATITIS B SURFACE ANTIBODY,QUALITATIVE: Hep B S Ab: REACTIVE — AB

## 2018-05-01 LAB — VARICELLA ZOSTER ANTIBODY, IGG: Varicella IgG: 2645 index

## 2018-05-01 LAB — MEASLES/MUMPS/RUBELLA IMMUNITY
Mumps IgG: 207 AU/mL
RUBEOLA IGG: 36.3 [AU]/ml
Rubella: 14.7 index

## 2018-05-01 LAB — LIPID PANEL
CHOL/HDL RATIO: 3.9 (calc) (ref <5.0)
Cholesterol: 230 mg/dL — ABNORMAL HIGH (ref <200)
HDL: 59 mg/dL (ref >50)
LDL CHOLESTEROL (CALC): 153 mg/dL — AB
NON-HDL CHOLESTEROL (CALC): 171 mg/dL — AB (ref <130)
TRIGLYCERIDES: 78 mg/dL (ref <150)

## 2018-05-01 LAB — HEPATITIS B SURFACE ANTIGEN: Hepatitis B Surface Ag: NONREACTIVE

## 2018-05-01 LAB — HEPATITIS B CORE ANTIBODY, TOTAL: Hep B Core Total Ab: NONREACTIVE

## 2018-05-07 ENCOUNTER — Encounter: Payer: Self-pay | Admitting: Family Medicine

## 2018-05-07 NOTE — Telephone Encounter (Signed)
Forwarding to Pincus Badder, CMA to resolve and address with patient

## 2018-05-30 ENCOUNTER — Encounter

## 2018-06-01 ENCOUNTER — Encounter: Payer: Self-pay | Admitting: Nurse Practitioner

## 2018-06-01 ENCOUNTER — Ambulatory Visit: Payer: Managed Care, Other (non HMO) | Admitting: Nurse Practitioner

## 2018-06-01 ENCOUNTER — Ambulatory Visit (INDEPENDENT_AMBULATORY_CARE_PROVIDER_SITE_OTHER): Payer: Managed Care, Other (non HMO) | Admitting: Nurse Practitioner

## 2018-06-01 VITALS — BP 136/74 | HR 96 | Temp 98.7°F | Resp 16 | Ht 68.0 in | Wt 188.4 lb

## 2018-06-01 DIAGNOSIS — R05 Cough: Secondary | ICD-10-CM

## 2018-06-01 DIAGNOSIS — R059 Cough, unspecified: Secondary | ICD-10-CM

## 2018-06-01 DIAGNOSIS — R6889 Other general symptoms and signs: Secondary | ICD-10-CM

## 2018-06-01 LAB — POCT INFLUENZA A/B
INFLUENZA A, POC: NEGATIVE
Influenza B, POC: NEGATIVE

## 2018-06-01 MED ORDER — DIPHENHYDRAMINE HCL 25 MG PO TABS: 25.0000 mg | ORAL_TABLET | Freq: Every evening | ORAL | 0 refills | Status: DC | PRN

## 2018-06-01 MED ORDER — BENZONATATE 150 MG PO CAPS: 150.0000 mg | ORAL_CAPSULE | Freq: Three times a day (TID) | ORAL | 0 refills | Status: DC | PRN

## 2018-06-01 MED ORDER — GUAIFENESIN ER 600 MG PO TB12: 600.0000 mg | ORAL_TABLET | Freq: Two times a day (BID) | ORAL | 0 refills | Status: DC

## 2018-06-01 NOTE — Patient Instructions (Signed)
You likely have a viral upper respiratory infection (URI). Antibiotics will not reduce the number of days you are ill or prevent you from getting bacterial rhinosinusitis. A URI can take up to 14 days to resolve, but typically last between 7-11 days. Your body is so smart and strong that it will be fighting this illness off for you but it is important that you drink plenty of fluids, rest. Cover your nose/mouth when you cough or sneeze and wash your hands well and often. Here are some helpful things you can use or pick up over the counter from the pharmacy to help with your symptoms:   For Fever/Pain: Acetaminophen every 6 hours as needed (maximum of 3000mg a day). If you are still uncomfortable you can add ibuprofen OR naproxen  For coughing: try dextromethorphan for a cough suppressant, and/or a cool mist humidifier, lozenges  For sore throat: saline gargles, honey herbal tea, lozenges, throat spray  To dry out your nose: try an antihistamine like loratadine (non-sedating) or diphenhydramine (sedating) or others To relieve a stuffy nose: try an oral decongestant  Like pseudoephedrine if you are under the age of 50 and do not have high blood pressure, neti pot To make blowing your nose easier and relieve chest congestion: guaifenesin 400mg every 4-6 hours of guaifenesin ER 600-1200 mg every 12 hours. Do not take more than 2,400mg a day.    

## 2018-06-01 NOTE — Progress Notes (Signed)
Name: Andrea Pope   MRN: [id number]    DOB: 1970/08/27   Date:06/01/2018       Progress Note  Subjective  Chief Complaint  Chief Complaint  Patient presents with  . Extremity Weakness  . Cough    deep and productive  . Chills    HPI  States on Wednesday had headache, abdominal pain- nausea and diarrhea, chills then started to feel better on Saturday then started to get cough- strong, dry right now but feels chest congestion and rhinorrhea. Denies sore throat, facial pain, ear fullness, shortness of breath.   Drinking lots of water and trying to stay hydrated, and taking ibuprofen.  Patient Active Problem List   Diagnosis Date Noted  . Absolute anemia 01/09/2018  . 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 Primary osteoarthritis of right knee 05/13/2017  . Status post hysteroscopy/D&C with NovaSure endometrial ablation-postop check 04/02/2017  . Status post D&C/hysteroscopy/endometrial ablation 03/24/2017  . Overweight (BMI 25.0-29.9) 11/19/2016  . Acute thoracic back pain 07/23/2016  . Neutropenia ([place]) 07/23/2016  . White coat hypertension 01/25/2016  . Dyspareunia, female 01/03/2016  . Dysmenorrhea 01/03/2016  . Menorrhagia with regular cycle 01/03/2016  . High cholesterol 01/01/2016  . Preventative health care 12/08/2015  . Gallstones 12/05/2015  . Irregular periods/menstrual cycles 11/29/2015  . Abdominal pain 11/29/2015  . Ovarian cyst 11/29/2015  . History of ectopic pregnancy 11/29/2015  . Breast cancer screening 09/21/2015  . Proteinuria 08/14/2015  . Right knee sprain 07/19/2015  . Abnormal thyroid stimulating hormone (TSH) level 01/10/2015  . Allergic rhinitis, seasonal 01/10/2015  . History of anemia 01/10/2015  . Excessive and frequent menstruation with irregular cycle 01/10/2015  . Asymptomatic varicose veins of both lower extremities 01/10/2015  . Neck pain, musculoskeletal 01/10/2015  . Hand numbness 09/28/2014  . Hand weakness 09/28/2014     Past Medical History:  Diagnosis Date  . Abnormal thyroid stimulating hormone level   . Allergy   . Anemia    iron deficiency  . Encounter for initial prescription of injectable contraceptive   . Female stress incontinence   . High cholesterol   . History of ectopic pregnancy 11/29/2015  . Menorrhagia with irregular cycle   . Ovarian cyst 11/29/2015  . Spider veins of both lower extremities     Past Surgical History:  Procedure Laterality Date  . CHOLECYSTECTOMY N/A 01/19/2016   Procedure: LAPAROSCOPIC CHOLECYSTECTOMY WITH INTRAOPERATIVE CHOLANGIOGRAM;  Surgeon: Christene Lye, MD;  Location: [facility] ORS;  Service: General;  Laterality: N/A;  . DILITATION & CURRETTAGE/HYSTROSCOPY WITH NOVASURE ABLATION N/A 03/24/2017   Procedure: DILATATION & CURETTAGE/HYSTEROSCOPY WITH NOVASURE ABLATION;  Surgeon: Brayton Mars, MD;  Location: [facility] ORS;  Service: Gynecology;  Laterality: N/A;  . laser surgery on eye Right 10/2016    Social History   Tobacco Use  . Smoking status: Never Smoker  . Smokeless tobacco: Never Used  Substance Use Topics  . Alcohol use: No    Alcohol/week: 0.0 standard drinks     Current Outpatient Medications:  .  Cyanocobalamin (B-12) 1000 MCG/ML KIT, Inject as directed., Disp: , Rfl:  .  Vitamin D, Ergocalciferol, (DRISDOL) 1.25 MG (50000 UT) CAPS capsule, Take 50,000 Units by mouth every 7 (seven) days., Disp: , Rfl:   No Known Allergies  ROS   No other specific complaints in a complete review of systems (except as listed in HPI above).  Objective  Vitals:   06/01/18 1512  BP: 136/74  Pulse: 96  Resp: 16  Temp:  98.7 F (37.1 C)  TempSrc: Oral  SpO2: 95%  Weight: 188 lb 6.4 oz (85.5 kg)  Height: _0  (1.727 m)    Body mass index is 28.65 kg/m.  Nursing Note and Vital Signs reviewed.  Physical Exam HENT:     Head: Normocephalic and atraumatic.     Right Ear: Hearing, tympanic membrane, ear canal and external ear normal.      Left Ear: Hearing, tympanic membrane, ear canal and external ear normal.     Nose: Mucosal edema and rhinorrhea present. No nasal tenderness.     Right Turbinates: Enlarged.     Left Turbinates: Enlarged.     Right Sinus: Frontal sinus tenderness present. No maxillary sinus tenderness.     Left Sinus: Frontal sinus tenderness (mildly more tender then right) present. No maxillary sinus tenderness.     Mouth/Throat:     Mouth: Mucous membranes are moist.     Pharynx: Uvula midline. No oropharyngeal exudate or posterior oropharyngeal erythema.  Eyes:     General:        Right eye: No discharge.        Left eye: No discharge.     Conjunctiva/sclera: Conjunctivae normal.  Neck:     Musculoskeletal: Normal range of motion.  Cardiovascular:     Rate and Rhythm: Normal rate.  Pulmonary:     Effort: Pulmonary effort is normal.     Breath sounds: Normal breath sounds.  Lymphadenopathy:     Cervical: No cervical adenopathy.  Skin:    General: Skin is warm and dry.     Findings: No rash.  Neurological:     Mental Status: She is alert.  Psychiatric:        Judgment: Judgment normal.       No results found for this or any previous visit (from the past 48 hour(s)).  Assessment & Plan  1. Flu-like symptoms Negative for flu - POCT Influenza A/B - diphenhydrAMINE (BENADRYL ALLERGY) 25 MG tablet; Take 1 tablet (25 mg total) by mouth at bedtime as needed.  Dispense: 30 tablet; Refill: 0  2. Cough Likely URI developed after flu, discussed OTC management and monitoring for secondary infections  - Benzonatate 150 MG CAPS; Take 1 capsule (150 mg total) by mouth 3 (three) times daily as needed.  Dispense: 42 capsule; Refill: 0 - guaiFENesin (MUCINEX) 600 MG 12 hr tablet; Take 1 tablet (600 mg total) by mouth 2 (two) times daily.  Dispense: 30 tablet; Refill: 0

## 2018-06-08 ENCOUNTER — Encounter: Payer: Self-pay | Admitting: Family Medicine

## 2018-06-08 MED ORDER — PROMETHAZINE-DM 6.25-15 MG/5ML PO SYRP: 2.5000 mL | ORAL_SOLUTION | Freq: Four times a day (QID) | ORAL | 0 refills | Status: DC | PRN

## 2018-06-09 ENCOUNTER — Ambulatory Visit (INDEPENDENT_AMBULATORY_CARE_PROVIDER_SITE_OTHER): Payer: Managed Care, Other (non HMO) | Admitting: Nurse Practitioner

## 2018-06-09 ENCOUNTER — Ambulatory Visit
Admission: RE | Admit: 2018-06-09 | Discharge: 2018-06-09 | Disposition: A | Discharge status: Home / Self Care | Payer: Managed Care, Other (non HMO) | Attending: Nurse Practitioner | Admitting: Nurse Practitioner

## 2018-06-09 ENCOUNTER — Ambulatory Visit
Admission: RE | Admit: 2018-06-09 | Discharge: 2018-06-09 | Disposition: A | Discharge status: Home / Self Care | Payer: Managed Care, Other (non HMO) | Source: Ambulatory Visit | Attending: Nurse Practitioner | Admitting: Nurse Practitioner

## 2018-06-09 ENCOUNTER — Encounter: Payer: Self-pay | Admitting: Emergency Medicine

## 2018-06-09 ENCOUNTER — Encounter: Payer: Self-pay | Admitting: Nurse Practitioner

## 2018-06-09 VITALS — BP 122/68 | HR 84 | Temp 98.7°F | Resp 16 | Ht 68.0 in | Wt 190.0 lb

## 2018-06-09 DIAGNOSIS — R059 Cough, unspecified: Secondary | ICD-10-CM

## 2018-06-09 DIAGNOSIS — J40 Bronchitis, not specified as acute or chronic: Secondary | ICD-10-CM

## 2018-06-09 DIAGNOSIS — R05 Cough: Secondary | ICD-10-CM | POA: Insufficient documentation

## 2018-06-09 MED ORDER — BENZONATATE 150 MG PO CAPS: 150.0000 mg | ORAL_CAPSULE | Freq: Three times a day (TID) | ORAL | 0 refills | Status: DC | PRN

## 2018-06-09 MED ORDER — PREDNISONE 10 MG (48) PO TBPK: ORAL_TABLET | ORAL | 0 refills | Status: DC

## 2018-06-09 NOTE — Progress Notes (Signed)
Name: Andrea Pope   MRN: [id number]    DOB: 1971-01-23   Date:06/09/2018       Progress Note  Subjective  Chief Complaint  Chief Complaint  Patient presents with  . Follow-up    patient is not feeling any better and feels that her sx has gotten worse  . Chest Pain    patient thinks it is due to the coughing  . Cough    very productive - cough syrup knocks her out and the Perls are not helping    HPI  Patient was taking tessalon perls without relief and benadryl without relief. States cough has gotten stronger and getting copious amounts of sputum, chest tenderness with coughing. Endorses fatigue. Denies shortness of breath, fevers, chills.    Patient Active Problem List   Diagnosis Date Noted  . Absolute anemia 01/09/2018  . 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 Primary osteoarthritis of right knee 05/13/2017  . Status post hysteroscopy/D&C with NovaSure endometrial ablation-postop check 04/02/2017  . Status post D&C/hysteroscopy/endometrial ablation 03/24/2017  . Overweight (BMI 25.0-29.9) 11/19/2016  . Acute thoracic back pain 07/23/2016  . Neutropenia ([place]) 07/23/2016  . White coat hypertension 01/25/2016  . Dyspareunia, female 01/03/2016  . Dysmenorrhea 01/03/2016  . Menorrhagia with regular cycle 01/03/2016  . High cholesterol 01/01/2016  . Preventative health care 12/08/2015  . Gallstones 12/05/2015  . Irregular periods/menstrual cycles 11/29/2015  . Abdominal pain 11/29/2015  . Ovarian cyst 11/29/2015  . History of ectopic pregnancy 11/29/2015  . Breast cancer screening 09/21/2015  . Proteinuria 08/14/2015  . Right knee sprain 07/19/2015  . Abnormal thyroid stimulating hormone (TSH) level 01/10/2015  . Allergic rhinitis, seasonal 01/10/2015  . History of anemia 01/10/2015  . Excessive and frequent menstruation with irregular cycle 01/10/2015  . Asymptomatic varicose veins of both lower extremities 01/10/2015  . Neck pain, musculoskeletal 01/10/2015   . Hand numbness 09/28/2014  . Hand weakness 09/28/2014    Past Medical History:  Diagnosis Date  . Abnormal thyroid stimulating hormone level   . Allergy   . Anemia    iron deficiency  . Encounter for initial prescription of injectable contraceptive   . Female stress incontinence   . High cholesterol   . History of ectopic pregnancy 11/29/2015  . Menorrhagia with irregular cycle   . Ovarian cyst 11/29/2015  . Spider veins of both lower extremities     Past Surgical History:  Procedure Laterality Date  . CHOLECYSTECTOMY N/A 01/19/2016   Procedure: LAPAROSCOPIC CHOLECYSTECTOMY WITH INTRAOPERATIVE CHOLANGIOGRAM;  Surgeon: Christene Lye, MD;  Location: [facility] ORS;  Service: General;  Laterality: N/A;  . DILITATION & CURRETTAGE/HYSTROSCOPY WITH NOVASURE ABLATION N/A 03/24/2017   Procedure: DILATATION & CURETTAGE/HYSTEROSCOPY WITH NOVASURE ABLATION;  Surgeon: Brayton Mars, MD;  Location: [facility] ORS;  Service: Gynecology;  Laterality: N/A;  . laser surgery on eye Right 10/2016    Social History   Tobacco Use  . Smoking status: Never Smoker  . Smokeless tobacco: Never Used  Substance Use Topics  . Alcohol use: No    Alcohol/week: 0.0 standard drinks     Current Outpatient Medications:  .  Benzonatate 150 MG CAPS, Take 1 capsule (150 mg total) by mouth 3 (three) times daily as needed., Disp: 42 capsule, Rfl: 0 .  Cyanocobalamin (B-12) 1000 MCG/ML KIT, Inject as directed., Disp: , Rfl:  .  diphenhydrAMINE (BENADRYL ALLERGY) 25 MG tablet, Take 1 tablet (25 mg total) by mouth at bedtime as needed., Disp: 30 tablet, Rfl: 0 .  promethazine-dextromethorphan (PROMETHAZINE-DM) 6.25-15 MG/5ML syrup, Take 2.5 mLs by mouth 4 (four) times daily as needed., Disp: 118 mL, Rfl: 0 .  Vitamin D, Ergocalciferol, (DRISDOL) 1.25 MG (50000 UT) CAPS capsule, Take 50,000 Units by mouth every 7 (seven) days., Disp: , Rfl:  .  guaiFENesin (MUCINEX) 600 MG 12 hr tablet, Take 1 tablet (600 mg  total) by mouth 2 (two) times daily. (Patient not taking: Reported on 06/09/2018), Disp: 30 tablet, Rfl: 0  No Known Allergies  ROS   No other specific complaints in a complete review of systems (except as listed in HPI above).  Objective  Vitals:   06/09/18 0912  BP: 122/68  Pulse: 84  Resp: 16  Temp: 98.7 F (37.1 C)  TempSrc: Oral  SpO2: 98%  Weight: 190 lb (86.2 kg)  Height: _0  (1.727 m)    Body mass index is 28.89 kg/m.  Nursing Note and Vital Signs reviewed.  Physical Exam Constitutional:      Appearance: She is well-developed.  HENT:     Head: Normocephalic and atraumatic.     Nose: Nose normal.     Mouth/Throat:     Mouth: Mucous membranes are moist.     Pharynx: Oropharynx is clear. Uvula midline.  Cardiovascular:     Rate and Rhythm: Normal rate and regular rhythm.     Heart sounds: Normal heart sounds.  Pulmonary:     Effort: Pulmonary effort is normal. No tachypnea or respiratory distress.     Breath sounds: No decreased breath sounds, wheezing or rhonchi.     Comments: Strong productive cough  Chest:     Chest wall: Tenderness present.  Musculoskeletal: Normal range of motion.  Lymphadenopathy:     Cervical: No cervical adenopathy.  Skin:    General: Skin is warm and dry.  Neurological:     General: No focal deficit present.     Mental Status: She is alert. She is disoriented.  Psychiatric:        Mood and Affect: Mood normal.        Behavior: Behavior normal.      No results found for this or any previous visit (from the past 48 hour(s)).  Assessment & Plan  1. Cough Subacute cough, worsening and more productive likely bronchitis.  Chest x-ray to rule out pneumonia due to fatigue.  Discussed side effects of prednisone.  Follow-up as needed, if cough is unimproved in a week may consider Z-Pak or pulmonology referral. - DG Chest 2 View; Future - Benzonatate 150 MG CAPS; Take 1 capsule (150 mg total) by mouth 3 (three) times daily as  needed.  Dispense: 42 capsule; Refill: 0  2. Bronchitis - predniSONE (STERAPRED UNI-PAK 48 TAB) 10 MG (48) TBPK tablet; Take as directed  Dispense: 48 tablet; Refill: 0    -Red flags and when to present for emergency care or RTC including fever >101.50F, chest pain, shortness of breath, new/worsening/un-resolving symptoms, reviewed with patient at time of visit. Follow up and care instructions discussed and provided in AVS.

## 2018-06-09 NOTE — Patient Instructions (Addendum)
-   Please go across the street to get your chest x-ray - Continue to take cough Syurp at night, tessalon perls during the day. - Take prednisone taper after you hear from [place] that your chest-xray does not show pneumonia. Eat healthy foods and drink plenty water with this medication.  - Please take [phone] mg every 12 hours of guaifenesin or PLAIN musinex.     Acute Bronchitis, Adult Acute bronchitis is when air tubes (bronchi) in the lungs suddenly get swollen. The condition can make it hard to breathe. It can also cause these symptoms:  A cough.  Coughing up clear, yellow, or green mucus.  Wheezing.  Chest congestion.  Shortness of breath.  A fever.  Body aches.  Chills.  A sore throat. Follow these instructions at home:  Medicines  Take over-the-counter and prescription medicines only as told by your doctor.  If you were prescribed an antibiotic medicine, take it as told by your doctor. Do not stop taking the antibiotic even if you start to feel better. General instructions  Rest.  Drink enough fluids to keep your pee (urine) pale yellow.  Avoid smoking and secondhand smoke. If you smoke and you need help quitting, ask your doctor. Quitting will help your lungs heal faster.  Use an inhaler, cool mist vaporizer, or humidifier as told by your doctor.  Keep all follow-up visits as told by your doctor. This is important. How is this prevented? To lower your risk of getting this condition again:  Wash your hands often with soap and water. If you cannot use soap and water, use hand sanitizer.  Avoid contact with people who have cold symptoms.  Try not to touch your hands to your mouth, nose, or eyes.  Make sure to get the flu shot every year. Contact a doctor if:  Your symptoms do not get better in 2 weeks. Get help right away if:  You cough up blood.  You have chest pain.  You have very bad shortness of breath.  You become dehydrated.  You faint (pass  out) or keep feeling like you are going to pass out.  You keep throwing up (vomiting).  You have a very bad headache.  Your fever or chills gets worse. This information is not intended to replace advice given to you by your health care provider. Make sure you discuss any questions you have with your health care provider. Document Released: 10/09/2007 Document Revised: 12/04/2016 Document Reviewed: 10/11/2015 Elsevier Interactive Patient Education  2019 [employer].

## 2018-06-11 ENCOUNTER — Telehealth: Payer: Self-pay | Admitting: Dietician

## 2018-06-11 ENCOUNTER — Ambulatory Visit: Payer: BLUE CROSS/BLUE SHIELD | Admitting: Dietician

## 2018-06-11 NOTE — Telephone Encounter (Signed)
Called patient to cancel today's appointment due to inclement weather. Will contact patient later to reschedule.

## 2018-06-11 NOTE — Telephone Encounter (Signed)
Patient returned call, rescheduled her appointment to 06/22/18

## 2018-06-22 ENCOUNTER — Encounter: Payer: Self-pay | Admitting: Dietician

## 2018-06-22 ENCOUNTER — Encounter: Payer: Managed Care, Other (non HMO) | Attending: Family Medicine | Admitting: Dietician

## 2018-06-22 VITALS — Ht 68.0 in | Wt 193.3 lb

## 2018-06-22 DIAGNOSIS — E663 Overweight: Secondary | ICD-10-CM | POA: Diagnosis not present

## 2018-06-22 DIAGNOSIS — E7800 Pure hypercholesterolemia, unspecified: Secondary | ICD-10-CM | POA: Insufficient documentation

## 2018-06-22 DIAGNOSIS — Z6829 Body mass index (BMI) 29.0-29.9, adult: Secondary | ICD-10-CM | POA: Diagnosis not present

## 2018-06-22 DIAGNOSIS — E782 Mixed hyperlipidemia: Secondary | ICD-10-CM

## 2018-06-22 NOTE — Patient Instructions (Signed)
   Vegetarian meals are often a healthy way to lose weight and lower cholesterol, continue with your good efforts!  Include a protein source + controlled portion of starchy food, + a vegetable and/or fruit with each meal.

## 2018-06-22 NOTE — Progress Notes (Signed)
Medical Nutrition Therapy: Visit start time: 1520  end time: 1625  Assessment:  Diagnosis: hyperlipidemia, overweight Past medical history: none significant per patient Psychosocial issues/ stress concerns: none  Preferred learning method:  . Auditory . Visual . Hands-on   Current weight: 193.3lbs with shoes and jacket Height: 5'8" Medications, supplements: reconciled list in medical record; currently taking Vitamin D and Vitamin b12 supplements only.   Progress and evaluation: Patient reports gradual increase in weight in recent months, but did lose a few pounds when ill last month.  She would like to follow vegan/ vegetarian eating pattern to manage both weight and cholesterol; she hopes to avoid need for medication. Cholesterol did decrease when she followed a similar diet 1-2 years ago.   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 Dining out frequency: 2 meals per week.  Breakfast: coffee + oatmeal, mini shredded wheats, or special K, or croissant Snack: none when at work; at home chips, fruit, or boiled egg Lunch: at work-- veggie wrap; bowl with rice, black beans, and salsa; occ at work  [employer]: chips/ pretzels with peanut butter, some candy (due to fatigue), seasonal fruit mango, Supper: tofu, beans, occasional baked fish + vegetables, rice, pasta Snack: usually none but occasional ice cream or donut or chips Beverages: water, coffee, green tea  Nutrition Care Education: Topics covered: vegetarian diet, weight control, hyperlipidemia Basic nutrition: appropriate nutrient balance, plate method for basic meal planning    Weight control: benefits of weight control, low fat and low sugar food choices, appropriate food portions, role of physical activity, guidance for about 1500kcal daily intake.  Hyperlipidemia: healthy and unhealthy fats, role of fiber, food sources of  folate, Vitamin B12, phytochemicals Vegetarian diet: importance of adequate protein intake and vegetarian protein sources, estimated goal for protein intake at 68grams daily; other potential deficient nutrients including vitamin B12, calcium, iron, zinc and food sources of these nutrients; importance of healthy carb choices and fiber-containing foods  Nutritional Diagnosis:  [place]-2.2 Altered nutrition-related laboratory As related to hyperlipidemia.  As evidenced by patient with total cholesterol of 230, and LD of 153mg /dl. [place]-3.3 Overweight/obesity As related to excess calories.  As evidenced by patient with current BMI of 29.  Intervention:   Discussion and instruction as noted above.  Patient has been making diet changes to resume vegetarian eating pattern, and is motivated to continue.  She will continue working to ensure well balanced meals and adequate nutritional intake.   No follow-up scheduled at this time; patient will schedule later if needed.  Education Materials given:  . Plate Planner with food lists . Vegetarian Proteins . Nutrition and Healthy Eating (vegetarian diet) -- [facility] . Goals/ instructions   Learner/ who was taught:  . Patient   Level of understanding: Marland Kitchen Verbalizes/ demonstrates competency   Demonstrated degree of understanding via:   Teach back Learning barriers: . None  Willingness to learn/ readiness for change: . Eager, change in progress  Monitoring and Evaluation:  Dietary intake, exercise, blood lipids, and body weight      follow up: prn

## 2018-08-26 ENCOUNTER — Other Ambulatory Visit: Payer: Self-pay

## 2018-08-26 ENCOUNTER — Encounter: Payer: Self-pay | Admitting: Nurse Practitioner

## 2018-08-26 ENCOUNTER — Ambulatory Visit (INDEPENDENT_AMBULATORY_CARE_PROVIDER_SITE_OTHER): Payer: Managed Care, Other (non HMO) | Admitting: Nurse Practitioner

## 2018-08-26 VITALS — Resp 16 | Ht 68.0 in | Wt 195.4 lb

## 2018-08-26 DIAGNOSIS — R1013 Epigastric pain: Secondary | ICD-10-CM | POA: Diagnosis not present

## 2018-08-26 DIAGNOSIS — R140 Abdominal distension (gaseous): Secondary | ICD-10-CM | POA: Diagnosis not present

## 2018-08-26 MED ORDER — FAMOTIDINE 20 MG PO TABS: 20.0000 mg | ORAL_TABLET | Freq: Two times a day (BID) | ORAL | 2 refills | Status: DC

## 2018-08-26 MED ORDER — SIMETHICONE 125 MG PO CHEW: 125.0000 mg | CHEWABLE_TABLET | Freq: Four times a day (QID) | ORAL | 0 refills | Status: DC | PRN

## 2018-08-26 NOTE — Progress Notes (Signed)
Virtual Visit via Video Note  I connected with Andrea Pope  on 08/26/18 at 11:00 AM EDT by a video enabled telemedicine application and verified that I am speaking with the correct person using two identifiers.   Staff discussed the limitations of evaluation and management by telemedicine and the availability of in person appointments. The patient expressed understanding and agreed to proceed.  Patient location: home  My location: home office Other people present:  none HPI   Patient endorses a lot of abdominal discomfort and bloating the past week after eating. States has had decreased appetite the last few days because of pain after eating. States lots of flatulence, but this is normal for her. Denies constipation- last bowel movement this morning- normal without straining. Endorses some nausea and epigastric abdominal pain with eating.   Eating rice, beans, vegetables, eating less meat.   Denies vomiting, diarrhea, chest pain, shortness of breath, belching, no changes in stools, fevers, chills.  PHQ2/9: Depression screen [facility] 2/9 08/26/2018 06/22/2018 06/09/2018 06/01/2018 04/28/2018  Decreased Interest 0 0 0 0 0  Down, Depressed, Hopeless 0 0 0 0 0  PHQ - 2 Score 0 0 0 0 0  Altered sleeping - - 0 0 0  Tired, decreased energy - - 0 0 0  Change in appetite - - 0 0 0  Feeling bad or failure about yourself  - - 0 0 0  Trouble concentrating - - 0 0 0  Moving slowly or fidgety/restless - - 0 0 0  Suicidal thoughts - - 0 0 0  PHQ-9 Score - - 0 0 0  Difficult doing work/chores - - Not difficult at all Not difficult at all Not difficult at all    PHQ reviewed. Negative  Patient Active Problem List   Diagnosis Date Noted  . Absolute anemia 01/09/2018  . 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 Primary osteoarthritis of right knee 05/13/2017  . Status post hysteroscopy/D&C with NovaSure endometrial ablation-postop check 04/02/2017  . Status post D&C/hysteroscopy/endometrial ablation  03/24/2017  . Overweight (BMI 25.0-29.9) 11/19/2016  . Acute thoracic back pain 07/23/2016  . Neutropenia ([place]) 07/23/2016  . White coat hypertension 01/25/2016  . Dyspareunia, female 01/03/2016  . Dysmenorrhea 01/03/2016  . Menorrhagia with regular cycle 01/03/2016  . High cholesterol 01/01/2016  . Preventative health care 12/08/2015  . Gallstones 12/05/2015  . Irregular periods/menstrual cycles 11/29/2015  . Abdominal pain 11/29/2015  . Ovarian cyst 11/29/2015  . History of ectopic pregnancy 11/29/2015  . Breast cancer screening 09/21/2015  . Proteinuria 08/14/2015  . Right knee sprain 07/19/2015  . Abnormal thyroid stimulating hormone (TSH) level 01/10/2015  . Allergic rhinitis, seasonal 01/10/2015  . History of anemia 01/10/2015  . Excessive and frequent menstruation with irregular cycle 01/10/2015  . Asymptomatic varicose veins of both lower extremities 01/10/2015  . Neck pain, musculoskeletal 01/10/2015  . Hand numbness 09/28/2014  . Hand weakness 09/28/2014    Past Medical History:  Diagnosis Date  . Abnormal thyroid stimulating hormone level   . Allergy   . Anemia    iron deficiency  . Encounter for initial prescription of injectable contraceptive   . Female stress incontinence   . High cholesterol   . History of ectopic pregnancy 11/29/2015  . Menorrhagia with irregular cycle   . Ovarian cyst 11/29/2015  . Spider veins of both lower extremities     Past Surgical History:  Procedure Laterality Date  . CHOLECYSTECTOMY N/A 01/19/2016   Procedure: LAPAROSCOPIC CHOLECYSTECTOMY WITH INTRAOPERATIVE CHOLANGIOGRAM;  Surgeon: Christene Lye, MD;  Location: [facility] ORS;  Service: General;  Laterality: N/A;  . DILITATION & CURRETTAGE/HYSTROSCOPY WITH NOVASURE ABLATION N/A 03/24/2017   Procedure: DILATATION & CURETTAGE/HYSTEROSCOPY WITH NOVASURE ABLATION;  Surgeon: Brayton Mars, MD;  Location: [facility] ORS;  Service: Gynecology;  Laterality: N/A;  . laser surgery on  eye Right 10/2016    Social History   Tobacco Use  . Smoking status: Never Smoker  . Smokeless tobacco: Never Used  Substance Use Topics  . Alcohol use: No    Alcohol/week: 0.0 standard drinks     Current Outpatient Medications:  Marland Kitchen  Vitamin D, Ergocalciferol, (DRISDOL) 1.25 MG (50000 UT) CAPS capsule, Take 50,000 Units by mouth every 7 (seven) days., Disp: , Rfl:  .  Cyanocobalamin (B-12) 1000 MCG/ML KIT, Inject as directed., Disp: , Rfl:  .  famotidine (PEPCID) 20 MG tablet, Take 1 tablet (20 mg total) by mouth 2 (two) times daily., Disp: 60 tablet, Rfl: 2 .  simethicone (MYLICON) 211 MG chewable tablet, Chew 1 tablet (125 mg total) by mouth every 6 (six) hours as needed for flatulence (take one hour before meals)., Disp: 60 tablet, Rfl: 0  No Known Allergies  ROS   No other specific complaints in a complete review of systems (except as listed in HPI above).  Objective  Vitals:   08/26/18 1027  Resp: 16  Weight: 195 lb 6.4 oz (88.6 kg)  Height: 5' 8"  (1.727 m)     Body mass index is 29.71 kg/m.  Nursing Note and Vital Signs reviewed.  Physical Exam  Constitutional: Patient appears well-developed and well-nourished. No distress.  HENT: Head: Normocephalic and atraumatic. Cardiovascular: Normal rate Pulmonary/Chest: Effort normal  Musculoskeletal: Normal range of motion,  Neurological: he is alert and oriented to person, place, and time. speech and gait are normal.  Abdominal: Mild bloating, mild epigastric tenderness, non-tender else where.  Skin: No rash noted. No erythema.  Psychiatric: Patient has a normal mood and affect. behavior is normal. Judgment and thought content normal.    Assessment & Plan  .1. Abdominal bloating Discussed diet  - famotidine (PEPCID) 20 MG tablet; Take 1 tablet (20 mg total) by mouth 2 (two) times daily.  Dispense: 60 tablet; Refill: 2 - simethicone (MYLICON) 941 MG chewable tablet; Chew 1 tablet (125 mg total) by mouth every  6 (six) hours as needed for flatulence (take one hour before meals).  Dispense: 60 tablet; Refill: 0  2. Epigastric pain Discussed diet, and red flag symptoms if worsens follow-up sooner, will order labs and further evaluation if unimproved in one week with methods discussed. - famotidine (PEPCID) 20 MG tablet; Take 1 tablet (20 mg total) by mouth 2 (two) times daily.  Dispense: 60 tablet; Refill: 2   Follow Up Instructions:   follow up in 1 week for abdominal pain  I discussed the assessment and treatment plan with the patient. The patient was provided an opportunity to ask questions and all were answered. The patient agreed with the plan and demonstrated an understanding of the instructions.   The patient was advised to call back or seek an in-person evaluation if the symptoms worsen or if the condition fails to improve as anticipated.  I provided 16 minutes of non-face-to-face time during this encounter.   Fredderick Severance, NP

## 2018-09-02 ENCOUNTER — Other Ambulatory Visit: Payer: Self-pay

## 2018-09-02 ENCOUNTER — Ambulatory Visit: Payer: Managed Care, Other (non HMO) | Admitting: Nurse Practitioner

## 2018-09-09 ENCOUNTER — Other Ambulatory Visit: Payer: Self-pay

## 2018-09-09 ENCOUNTER — Encounter: Payer: Self-pay | Admitting: Nurse Practitioner

## 2018-09-09 ENCOUNTER — Ambulatory Visit (INDEPENDENT_AMBULATORY_CARE_PROVIDER_SITE_OTHER): Payer: Managed Care, Other (non HMO) | Admitting: Nurse Practitioner

## 2018-09-09 VITALS — Ht 68.0 in | Wt 193.0 lb

## 2018-09-09 DIAGNOSIS — K219 Gastro-esophageal reflux disease without esophagitis: Secondary | ICD-10-CM

## 2018-09-09 DIAGNOSIS — R140 Abdominal distension (gaseous): Secondary | ICD-10-CM

## 2018-09-09 NOTE — Progress Notes (Signed)
Virtual Visit via Video Note  I connected with Andrea Pope on 09/09/18 at 10:00 AM EDT by a video enabled telemedicine application and verified that I am speaking with the correct person using two identifiers.   Staff discussed the limitations of evaluation and management by telemedicine and the availability of in person appointments. The patient expressed understanding and agreed to proceed.  Patient location: home  My location: work office Other people present: none HPI  Patient presents for 2 week follow up on abdominal discomfort and bloating. States was able to get the famotidine, and pharmacy did not have simethicone. States is still gets bloating with bread, much improved   PHQ2/9: Depression screen [facility] 2/9 09/09/2018 08/26/2018 06/22/2018 06/09/2018 06/01/2018  Decreased Interest 0 0 0 0 0  Down, Depressed, Hopeless 0 0 0 0 0  PHQ - 2 Score 0 0 0 0 0  Altered sleeping - - - 0 0  Tired, decreased energy - - - 0 0  Change in appetite - - - 0 0  Feeling bad or failure about yourself  - - - 0 0  Trouble concentrating - - - 0 0  Moving slowly or fidgety/restless - - - 0 0  Suicidal thoughts - - - 0 0  PHQ-9 Score - - - 0 0  Difficult doing work/chores - - - Not difficult at all Not difficult at all     PHQ reviewed. Negativ  Patient Active Problem List   Diagnosis Date Noted  . Absolute anemia 01/09/2018  . 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 Primary osteoarthritis of right knee 05/13/2017  . Status post hysteroscopy/D&C with NovaSure endometrial ablation-postop check 04/02/2017  . Status post D&C/hysteroscopy/endometrial ablation 03/24/2017  . Overweight (BMI 25.0-29.9) 11/19/2016  . Acute thoracic back pain 07/23/2016  . Neutropenia ([place]) 07/23/2016  . White coat hypertension 01/25/2016  . Dyspareunia, female 01/03/2016  . Dysmenorrhea 01/03/2016  . Menorrhagia with regular cycle 01/03/2016  . High cholesterol 01/01/2016  . Preventative health care  12/08/2015  . Gallstones 12/05/2015  . Irregular periods/menstrual cycles 11/29/2015  . Abdominal pain 11/29/2015  . Ovarian cyst 11/29/2015  . History of ectopic pregnancy 11/29/2015  . Breast cancer screening 09/21/2015  . Proteinuria 08/14/2015  . Right knee sprain 07/19/2015  . Abnormal thyroid stimulating hormone (TSH) level 01/10/2015  . Allergic rhinitis, seasonal 01/10/2015  . History of anemia 01/10/2015  . Excessive and frequent menstruation with irregular cycle 01/10/2015  . Asymptomatic varicose veins of both lower extremities 01/10/2015  . Neck pain, musculoskeletal 01/10/2015  . Hand numbness 09/28/2014  . Hand weakness 09/28/2014    Past Medical History:  Diagnosis Date  . Abnormal thyroid stimulating hormone level   . Allergy   . Anemia    iron deficiency  . Encounter for initial prescription of injectable contraceptive   . Female stress incontinence   . High cholesterol   . History of ectopic pregnancy 11/29/2015  . Menorrhagia with irregular cycle   . Ovarian cyst 11/29/2015  . Spider veins of both lower extremities     Past Surgical History:  Procedure Laterality Date  . CHOLECYSTECTOMY N/A 01/19/2016   Procedure: LAPAROSCOPIC CHOLECYSTECTOMY WITH INTRAOPERATIVE CHOLANGIOGRAM;  Surgeon: Christene Lye, MD;  Location: [facility] ORS;  Service: General;  Laterality: N/A;  . DILITATION & CURRETTAGE/HYSTROSCOPY WITH NOVASURE ABLATION N/A 03/24/2017   Procedure: DILATATION & CURETTAGE/HYSTEROSCOPY WITH NOVASURE ABLATION;  Surgeon: Brayton Mars, MD;  Location: [facility] ORS;  Service: Gynecology;  Laterality: N/A;  . laser  surgery on eye Right 10/2016    Social History   Tobacco Use  . Smoking status: Never Smoker  . Smokeless tobacco: Never Used  Substance Use Topics  . Alcohol use: No    Alcohol/week: 0.0 standard drinks     Current Outpatient Medications:  .  famotidine (PEPCID) 20 MG tablet, Take 1 tablet (20 mg total) by mouth 2 (two) times  daily., Disp: 60 tablet, Rfl: 2 .  Vitamin D, Ergocalciferol, (DRISDOL) 1.25 MG (50000 UT) CAPS capsule, Take 50,000 Units by mouth every 7 (seven) days., Disp: , Rfl:  .  Cyanocobalamin (B-12) 1000 MCG/ML KIT, Inject as directed., Disp: , Rfl:  .  simethicone (MYLICON) 372 MG chewable tablet, Chew 1 tablet (125 mg total) by mouth every 6 (six) hours as needed for flatulence (take one hour before meals). (Patient not taking: Reported on 09/09/2018), Disp: 60 tablet, Rfl: 0  No Known Allergies  ROS    No other specific complaints in a complete review of systems (except as listed in HPI above).  Objective  Vitals:   09/09/18 0837  Weight: 193 lb (87.5 kg)  Height: 5' 8" (1.727 m)   Body mass index is 29.35 kg/m.  Nursing Note and Vital Signs reviewed.  Physical Exam  Constitutional: Patient appears well-developed and well-nourished. No distress.  HENT: Head: Normocephalic and atraumatic. Pulmonary/Chest: Effort normal  Neurological:  alert and orientedspeech  normal.  Psychiatric: Patient has a normal mood and affect. behavior is normal. Judgment and thought content normal.    Assessment & Plan  1. Abdominal bloating Discussed diet; improving with diet changes - You can take simethicone 30 minutes before gas forming foods to help with gas pain and abdominal bloating   2. Gastroesophageal reflux disease without esophagitis - Try taking famotidine just as night and if that is working well you can take it then just as needed. Avoid trigger foods or take it on days you know you will be consuming those.    Follow Up Instructions:   CPE in December   I discussed the assessment and treatment plan with the patient. The patient was provided an opportunity to ask questions and all were answered. The patient agreed with the plan and demonstrated an understanding of the instructions.   The patient was advised to call back or seek an in-person evaluation if the symptoms worsen or  if the condition fails to improve as anticipated.  I provided 11 minutes of non-face-to-face time during this encounter.   Fredderick Severance, NP

## 2018-09-09 NOTE — Patient Instructions (Signed)
-   You can take simethicone 30 minutes before gas forming foods to help with gas pain and abdominal bloating - Try taking famotidine just as night and if that is working well you can take it then just as needed. Avoid trigger foods or take it on days you know you will be consuming those.

## 2018-10-21 ENCOUNTER — Other Ambulatory Visit: Payer: Self-pay

## 2018-10-21 ENCOUNTER — Encounter: Payer: Self-pay | Admitting: Family Medicine

## 2018-10-21 ENCOUNTER — Ambulatory Visit (INDEPENDENT_AMBULATORY_CARE_PROVIDER_SITE_OTHER): Payer: Managed Care, Other (non HMO) | Admitting: Family Medicine

## 2018-10-21 DIAGNOSIS — R0689 Other abnormalities of breathing: Secondary | ICD-10-CM

## 2018-10-21 DIAGNOSIS — R0681 Apnea, not elsewhere classified: Secondary | ICD-10-CM | POA: Diagnosis not present

## 2018-10-21 DIAGNOSIS — G4719 Other hypersomnia: Secondary | ICD-10-CM | POA: Diagnosis not present

## 2018-10-21 MED ORDER — ALBUTEROL SULFATE HFA 108 (90 BASE) MCG/ACT IN AERS: 2.0000 | INHALATION_SPRAY | Freq: Four times a day (QID) | RESPIRATORY_TRACT | 0 refills | Status: DC | PRN

## 2018-10-21 NOTE — Progress Notes (Signed)
Name: Andrea Pope   MRN: [id number]    DOB: [birth year]   Date:10/21/2018       Progress Note  Subjective  Chief Complaint  Chief Complaint  Patient presents with  . Shortness of Breath    I connected with  Marye K Putnam  on 10/21/18 at  7:20 AM EDT by a video enabled telemedicine application and verified that I am speaking with the correct person using two identifiers.  I discussed the limitations of evaluation and management by telemedicine and the availability of in person appointments. The patient expressed understanding and agreed to proceed. Staff also discussed with the patient that there may be a patient responsible charge related to this service. Patient Location: Home Provider Location: Home Additional Individuals present: None  HPI  Pt presents with concern for shortness of breath for 4 days.  She also notes an episode of apnea where her husband woke her up from sleep saying it sounded like she had stopped breathing for a minute - this occurred 2 days ago. Endorses feeling of not being able to take a deep breath.  The shortness of breath is happening randomly, and it feels like she can't quite get a full breath, no rapid breathing.  She denies chest pain, no fever, no nasal congestion, ear ache, throat pain. Her GERD has been well controlled.  She has no history of asthma or lung issues in the past. She does endorse significant fatigue during the day.   Results of the Epworth flowsheet 10/21/2018  Sitting and reading 3  Watching TV 2  Sitting, inactive in a public place (e.g. a theatre or a meeting) 2  As a passenger in a car for an hour without a break 3  Lying down to rest in the afternoon when circumstances permit 3  Sitting and talking to someone 1  Sitting quietly after a lunch without alcohol 3  In a car, while stopped for a few minutes in traffic 0  Total score 17     Patient Active Problem List   Diagnosis Date Noted  . Absolute anemia 01/09/2018  .  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 Primary osteoarthritis of right knee 05/13/2017  . Status post hysteroscopy/D&C with NovaSure endometrial ablation-postop check 04/02/2017  . Status post D&C/hysteroscopy/endometrial ablation 03/24/2017  . Overweight (BMI 25.0-29.9) 11/19/2016  . Acute thoracic back pain 07/23/2016  . Neutropenia ([place]) 07/23/2016  . White coat hypertension 01/25/2016  . Dyspareunia, female 01/03/2016  . Dysmenorrhea 01/03/2016  . Menorrhagia with regular cycle 01/03/2016  . High cholesterol 01/01/2016  . Preventative health care 12/08/2015  . Gallstones 12/05/2015  . Irregular periods/menstrual cycles 11/29/2015  . Abdominal pain 11/29/2015  . Ovarian cyst 11/29/2015  . History of ectopic pregnancy 11/29/2015  . Breast cancer screening 09/21/2015  . Proteinuria 08/14/2015  . Right knee sprain 07/19/2015  . Abnormal thyroid stimulating hormone (TSH) level 01/10/2015  . Allergic rhinitis, seasonal 01/10/2015  . History of anemia 01/10/2015  . Excessive and frequent menstruation with irregular cycle 01/10/2015  . Asymptomatic varicose veins of both lower extremities 01/10/2015  . Neck pain, musculoskeletal 01/10/2015  . Hand numbness 09/28/2014  . Hand weakness 09/28/2014    Past Surgical History:  Procedure Laterality Date  . CHOLECYSTECTOMY N/A 01/19/2016   Procedure: LAPAROSCOPIC CHOLECYSTECTOMY WITH INTRAOPERATIVE CHOLANGIOGRAM;  Surgeon: Christene Lye, MD;  Location: [facility] ORS;  Service: General;  Laterality: N/A;  . DILITATION & CURRETTAGE/HYSTROSCOPY WITH NOVASURE ABLATION N/A 03/24/2017   Procedure: DILATATION &  CURETTAGE/HYSTEROSCOPY WITH NOVASURE ABLATION;  Surgeon: Brayton Mars, MD;  Location: [facility] ORS;  Service: Gynecology;  Laterality: N/A;  . laser surgery on eye Right 10/2016    Family History  Problem Relation Age of Onset  . Hypertension Mother   . Thyroid disease Mother   . Kidney disease Mother   . Hypertension  Father   . Cancer Father        Prostate  . Diabetes Brother   . Diabetes Other   . Cancer Paternal Uncle        Prostate  . Diabetes Maternal Grandmother   . Cancer Paternal Grandfather        Prostate  . Diabetes Sister   . Schizophrenia Sister   . Breast cancer Neg Hx   . Ovarian cancer Neg Hx   . Heart disease Neg Hx     Social History   Socioeconomic History  . Marital status: Married    Spouse name: Ron   . Number of children: 4  . Years of education: Not on file  . Highest education level: Bachelor's degree (e.g., BA, AB, BS)  Occupational History  . Occupation: Pharmacist, hospital  Social Needs  . Financial resource strain: Not hard at all  . Food insecurity    Worry: Never true    Inability: Never true  . Transportation needs    Medical: No    Non-medical: No  Tobacco Use  . Smoking status: Never Smoker  . Smokeless tobacco: Never Used  Substance and Sexual Activity  . Alcohol use: No    Alcohol/week: 0.0 standard drinks  . Drug use: No  . Sexual activity: Yes    Partners: Male    Birth control/protection: None  Lifestyle  . Physical activity    Days per week: 5 days    Minutes per session: 60 min  . Stress: Not at all  Relationships  . Social connections    Talks on phone: More than three times a week    Gets together: More than three times a week    Attends religious service: 1 to 4 times per year    Active member of club or organization: Yes    Attends meetings of clubs or organizations: 1 to 4 times per year    Relationship status: Married  . Intimate partner violence    Fear of current or ex partner: No    Emotionally abused: No    Physically abused: No    Forced sexual activity: No  Other Topics Concern  . Not on file  Social History Narrative  . Not on file     Current Outpatient Medications:  .  Cyanocobalamin (B-12) 1000 MCG/ML KIT, Inject as directed., Disp: , Rfl:  .  famotidine (PEPCID) 20 MG tablet, Take 1 tablet (20 mg total) by mouth  2 (two) times daily., Disp: 60 tablet, Rfl: 2 .  Vitamin D, Ergocalciferol, (DRISDOL) 1.25 MG (50000 UT) CAPS capsule, Take 50,000 Units by mouth every 7 (seven) days., Disp: , Rfl:  .  simethicone (MYLICON) 681 MG chewable tablet, Chew 1 tablet (125 mg total) by mouth every 6 (six) hours as needed for flatulence (take one hour before meals). (Patient not taking: Reported on 09/09/2018), Disp: 60 tablet, Rfl: 0  No Known Allergies  I personally reviewed active problem list, medication list, allergies, notes from last encounter, lab results with the patient/caregiver today.   ROS Ten systems reviewed and is negative except as mentioned in HPI  Objective  Virtual encounter, vitals not obtained.  There is no height or weight on file to calculate BMI.  Physical Exam Constitutional: Patient appears well-developed and well-nourished. No distress.  HENT: Head: Normocephalic and atraumatic.  Neck: Normal range of motion. Pulmonary/Chest: Effort normal. No respiratory distress. Speaking in complete sentences Neurological: Pt is alert and oriented to person, place, and time. Coordination, speech are normal.  Psychiatric: Patient has a normal mood and affect. behavior is normal. Judgment and thought content normal.  No results found for this or any previous visit (from the past 72 hour(s)).  PHQ2/9: Depression screen [facility] 2/9 10/21/2018 09/09/2018 08/26/2018 06/22/2018 06/09/2018  Decreased Interest 0 0 0 0 0  Down, Depressed, Hopeless 0 0 0 0 0  PHQ - 2 Score 0 0 0 0 0  Altered sleeping 0 - - - 0  Tired, decreased energy 0 - - - 0  Change in appetite 0 - - - 0  Feeling bad or failure about yourself  0 - - - 0  Trouble concentrating 0 - - - 0  Moving slowly or fidgety/restless 0 - - - 0  Suicidal thoughts 0 - - - 0  PHQ-9 Score 0 - - - 0  Difficult doing work/chores Not difficult at all - - - Not difficult at all   PHQ-2/9 Result is negative.    Fall Risk: Fall Risk  10/21/2018 09/09/2018  08/26/2018 06/22/2018 06/09/2018  Falls in the past year? 0 0 0 0 0  Number falls in past yr: 0 0 0 - 0  Injury with Fall? 0 0 0 - 0  Follow up Falls evaluation completed - - - -    Assessment & Plan  1. Excessive daytime sleepiness - Ambulatory referral to Pulmonology  2. Witnessed apneic spells - Ambulatory referral to Pulmonology  3. Breathing difficulty - albuterol (VENTOLIN HFA) 108 (90 Base) MCG/ACT inhaler; Inhale 2 puffs into the lungs every 6 (six) hours as needed for wheezing or shortness of breath.  Dispense: 1 Inhaler; Refill: 0 - Ambulatory referral to Pulmonology  - Advised we are unable to perform spirometry in office, but I will provide albuterol inhaler to see if this helps her sensation of not being able to take a deep breath.  Her Epworth score is quite elevated and has had a recent witnessed apneic episode by her husband.  Referral to Dr. Ramachandran/pulmonology is placed for sleep eval.  I discussed the assessment and treatment plan with the patient. The patient was provided an opportunity to ask questions and all were answered. The patient agreed with the plan and demonstrated an understanding of the instructions.  The patient was advised to call back or seek an in-person evaluation if the symptoms worsen or if the condition fails to improve as anticipated.  I provided 16 minutes of non-face-to-face time during this encounter.

## 2018-10-27 ENCOUNTER — Telehealth: Payer: Self-pay | Admitting: Internal Medicine

## 2018-10-27 NOTE — Telephone Encounter (Signed)
Called patient for COVID-19 pre-screening for in office visit. ° °Have you recently traveled any where out of the local area in the last 2 weeks? No ° °Have you been in close contact with a person diagnosed with COVID-19 within the last 2 weeks? No ° °Do you currently have any of the following symptoms? If so, when did they start? °Cough     Diarrhea   Joint Pain °Fever      Muscle Pain   Red eyes °Shortness of breath   Abdominal pain  Vomiting °Loss of smell    Rash    Sore Throat °Headache    Weakness   Bruising or bleeding ° ° °Okay to proceed with visit 10/28/2018  ° ° °

## 2018-10-28 ENCOUNTER — Other Ambulatory Visit: Payer: Self-pay

## 2018-10-28 ENCOUNTER — Encounter: Payer: Self-pay | Admitting: Internal Medicine

## 2018-10-28 ENCOUNTER — Ambulatory Visit: Payer: Managed Care, Other (non HMO) | Admitting: Internal Medicine

## 2018-10-28 VITALS — BP 130/80 | HR 66 | Temp 98.4°F | Ht 68.0 in | Wt 195.6 lb

## 2018-10-28 DIAGNOSIS — G4719 Other hypersomnia: Secondary | ICD-10-CM | POA: Diagnosis not present

## 2018-10-28 NOTE — Patient Instructions (Signed)

## 2018-10-28 NOTE — Progress Notes (Addendum)
[facility]      Assessment and Plan:  Excessive daytime sleepiness. - Symptoms and signs of obstructive sleep apnea. - Occasional morning paralysis symptoms, may be related to excessive sleepiness, denies other symptoms of cataplexy. - We will send for sleep study, start on CPAP as appropriate. - History of previous sleep study about 5 years ago, will see if we can obtain these results.  Orders Placed This Encounter  Procedures  . Home sleep test   Return in about 3 months (around 01/28/2019).    Date: 10/28/2018  MRN# [id number] Andrea Pope Jun 01, 1970    Andrea Columbiaaeemah K Tool is a 48 y.o. old female seen in consultation for chief complaint of:    Chief Complaint  Patient presents with  . sleep consult    per Maurice SmallEmily Boyce- prior sleeo study neg for OSA. c/o daytime sleepiness, loud snoring, stop breathing during sleep & occ restless sleep [id number]    HPI:  Andrea Columbiaaeemah K Yarde is a 48 y.o. female presenting with symptoms of daytime sleepiness.  She usually goes to bed between 8 and 10 PM, fall asleep quickly, gets out of bed at 6 AM feeling tired and fatigued.  She has gained about 6 to 8 pounds over the last few years.  Epworth is elevated at 17 today.  Her husband has told her that she is gasping for air and snoring loudly. She [facility], she was told that she had light snoring but no CPAP was required.  She is more tired during the day now, she works as a Runner, broadcasting/film/videoteacher.  Has occasional AM headaches.  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  Her mother and sis both are on CPAP.   Addendum: **PSG 08/28/2014>> AHI 0.3.  Negative for sleep apnea.  PMHX:   Past Medical History:  Diagnosis Date  . Abnormal thyroid stimulating hormone level   . Allergy   . Anemia    iron deficiency  . Encounter for initial prescription of injectable contraceptive   . 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 High cholesterol   . History of ectopic pregnancy 11/29/2015  . Menorrhagia with irregular cycle   . Ovarian cyst 11/29/2015  . Spider veins of both lower extremities    Surgical Hx:  Past Surgical History:  Procedure Laterality Date  . CHOLECYSTECTOMY N/A 01/19/2016   Procedure: LAPAROSCOPIC CHOLECYSTECTOMY WITH INTRAOPERATIVE CHOLANGIOGRAM;  Surgeon: Kieth BrightlySeeplaputhur G Sankar, MD;  Location: [facility] ORS;  Service: General;  Laterality: N/A;  . DILITATION & CURRETTAGE/HYSTROSCOPY WITH NOVASURE ABLATION N/A 03/24/2017   Procedure: DILATATION & CURETTAGE/HYSTEROSCOPY WITH NOVASURE ABLATION;  Surgeon: Herold Harmsefrancesco, Martin A, MD;  Location: [facility] ORS;  Service: Gynecology;  Laterality: N/A;  . laser surgery on eye Right 10/2016   Family Hx:  Family History  Problem Relation Age of Onset  . Hypertension Mother   . Thyroid disease Mother   . Kidney disease Mother   . Hypertension Father   . Cancer Father        Prostate  . Diabetes Brother   . Diabetes Other   . Cancer Paternal Uncle        Prostate  . Diabetes Maternal Grandmother   . Cancer Paternal Grandfather        Prostate  . Diabetes Sister   . Schizophrenia Sister   . Breast cancer Neg Hx   . Ovarian cancer Neg Hx   .  Heart disease Neg Hx    Social Hx:   Social History   Tobacco Use  . Smoking status: Never Smoker  . Smokeless tobacco: Never Used  Substance Use Topics  . Alcohol use: No    Alcohol/week: 0.0 standard drinks  . Drug use: No   Medication:    Current Outpatient Medications:  .  albuterol (VENTOLIN HFA) 108 (90 Base) MCG/ACT inhaler, Inhale 2 puffs into the lungs every 6 (six) hours as needed for wheezing or shortness of breath., Disp: 1 Inhaler, Rfl: 0 .  famotidine (PEPCID) 20 MG tablet, Take 1 tablet (20 mg total) by mouth 2 (two) times daily., Disp: 60 tablet, Rfl: 2 .  Vitamin D, Ergocalciferol, (DRISDOL) 1.25 MG (50000 UT) CAPS capsule, Take 50,000 Units by mouth every 7 (seven) days.,  Disp: , Rfl:    Allergies:  Patient has no known allergies.  Review of Systems: Gen:  Denies  fever, sweats, chills HEENT: Denies blurred vision, double vision. bleeds, sore throat Cvc:  No dizziness, chest pain. Resp:   Denies cough or sputum production, shortness of breath Gi: Denies swallowing difficulty, stomach pain. Gu:  Denies bladder incontinence, burning urine Ext:   No Joint pain, stiffness. Skin: No skin rash,  hives  Endoc:  No polyuria, polydipsia. Psych: No depression, insomnia. Other:  All other systems were reviewed with the patient and were negative other that what is mentioned in the HPI.   Physical Examination:   VS: BP 130/80 (BP Location: Left Arm, Cuff Size: Normal)   Pulse 66   Temp 98.4 F (36.9 C) (Oral)   Ht 5\' 8"  (1.727 m)   Wt 195 lb 9.6 oz (88.7 kg)   SpO2 96%   BMI 29.74 kg/m   General Appearance: No distress  Neuro:without focal findings,  speech normal,  HEENT: PERRLA, EOM intact.   Pulmonary: normal breath sounds, No wheezing.  CardiovascularNormal S1,S2.  No m/r/g.   Abdomen: Benign, Soft, non-tender. Renal:  No costovertebral tenderness  GU:  No performed at this time. Endoc: No evident thyromegaly, no signs of acromegaly. Skin:   warm, no rashes, no ecchymosis  Extremities: normal, no cyanosis, clubbing.  Other findings:    LABORATORY PANEL:   CBC No results for input(s): WBC, HGB, HCT, PLT in the last 168 hours. ------------------------------------------------------------------------------------------------------------------  Chemistries  No results for input(s): NA, K, CL, CO2, GLUCOSE, BUN, CREATININE, CALCIUM, MG, AST, ALT, ALKPHOS, BILITOT in the last 168 hours.  Invalid input(s): GFRCGP ------------------------------------------------------------------------------------------------------------------  Cardiac Enzymes No results for input(s): TROPONINI in the last 168 hours.  ------------------------------------------------------------  RADIOLOGY:  No results found.     Thank  you for the consultation and for allowing [facility] to assist in the care of your patient. Our recommendations are noted above.  Please contact us if we can be of further service.   Marda Stalker, M.D., F.C.C.P.  Board Certified in Internal Medicine, Pulmonary Medicine, [place], and Sleep Medicine.  [facility] Office Number: [phone]   10/28/2018

## 2018-11-10 ENCOUNTER — Other Ambulatory Visit: Payer: Self-pay

## 2018-11-10 ENCOUNTER — Ambulatory Visit: Payer: Managed Care, Other (non HMO)

## 2018-11-10 DIAGNOSIS — G4733 Obstructive sleep apnea (adult) (pediatric): Secondary | ICD-10-CM

## 2018-11-10 DIAGNOSIS — G4719 Other hypersomnia: Secondary | ICD-10-CM

## 2018-11-17 ENCOUNTER — Other Ambulatory Visit: Payer: Self-pay | Admitting: Family Medicine

## 2018-11-19 ENCOUNTER — Telehealth: Payer: Self-pay | Admitting: Internal Medicine

## 2018-11-19 NOTE — Telephone Encounter (Signed)
Called and spoke to pt, who is requesting HST results.  I have made pt aware that HST has not been read yet, however it is in DR folder to be ready. Advised pt that our office would contact her with results.  Nothing further is needed at this time.

## 2018-11-20 ENCOUNTER — Telehealth: Payer: Self-pay

## 2018-11-20 DIAGNOSIS — G4733 Obstructive sleep apnea (adult) (pediatric): Secondary | ICD-10-CM

## 2018-11-20 NOTE — Telephone Encounter (Signed)
Pt contacted and notified that her sleep study showed mild obstructive sleep apnea and it is recommended that she start auto-CPAP with a pressure range of 5-20 cm H20. Pt wishes to proceed and is aware that she will need to be seen within 31-90 after starting using the machine. Pt states she will call and schedule appt. Once she receives the machine. Order has been placed no further actions necessary at this time.

## 2018-11-23 ENCOUNTER — Other Ambulatory Visit: Payer: Self-pay

## 2018-11-23 ENCOUNTER — Ambulatory Visit (INDEPENDENT_AMBULATORY_CARE_PROVIDER_SITE_OTHER): Payer: Managed Care, Other (non HMO) | Admitting: Family Medicine

## 2018-11-23 ENCOUNTER — Encounter: Payer: Self-pay | Admitting: Family Medicine

## 2018-11-23 VITALS — BP 130/84 | HR 71 | Temp 97.9°F | Resp 14 | Ht 68.0 in | Wt 195.8 lb

## 2018-11-23 DIAGNOSIS — N6320 Unspecified lump in the left breast, unspecified quadrant: Secondary | ICD-10-CM | POA: Diagnosis not present

## 2018-11-23 DIAGNOSIS — Z9889 Other specified postprocedural states: Secondary | ICD-10-CM

## 2018-11-23 DIAGNOSIS — N926 Irregular menstruation, unspecified: Secondary | ICD-10-CM | POA: Diagnosis not present

## 2018-11-23 DIAGNOSIS — N6325 Unspecified lump in the left breast, overlapping quadrants: Secondary | ICD-10-CM

## 2018-11-23 NOTE — Progress Notes (Addendum)
Name: Andrea Pope   MRN: [id number]    DOB: [birth year]   Date:11/23/2018       Progress Note  Subjective  Chief Complaint  Chief Complaint  Patient presents with  . Breast Problem    lump found in left breast.  Last mammogram over 2 years. Patient stated she noticed it 3 weeks ago her husband stated it has been there over 2 months    HPI  Pt presents with concern for LEFT breast lump.  Last mammogram was about 2 years ago.  Her husband noticed the lump a few months ago, but pt did not feel anything at that time.  About a week ago she was having some tenderness and performed a SBE and did find the lump that time.  No nipple discharge, no rash or lesions that she has noticed, no changes in nipple shape, no changes in breast shape.  She does note her left breast is slightly larger than the right.  - Drinks about 16oz coffee most days; she is s/p uterine ablation; she is a never smoker.   Patient Active Problem List   Diagnosis Date Noted  . Absolute anemia 01/09/2018  . 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 Primary osteoarthritis of right knee 05/13/2017  . Status post hysteroscopy/D&C with NovaSure endometrial ablation-postop check 04/02/2017  . Status post D&C/hysteroscopy/endometrial ablation 03/24/2017  . Overweight (BMI 25.0-29.9) 11/19/2016  . Acute thoracic back pain 07/23/2016  . Neutropenia ([place]) 07/23/2016  . White coat hypertension 01/25/2016  . Dyspareunia, female 01/03/2016  . Dysmenorrhea 01/03/2016  . Menorrhagia with regular cycle 01/03/2016  . High cholesterol 01/01/2016  . Preventative health care 12/08/2015  . Gallstones 12/05/2015  . Irregular periods/menstrual cycles 11/29/2015  . Abdominal pain 11/29/2015  . Ovarian cyst 11/29/2015  . History of ectopic pregnancy 11/29/2015  . Breast cancer screening 09/21/2015  . Proteinuria 08/14/2015  . Right knee sprain 07/19/2015  . Abnormal thyroid stimulating hormone (TSH) level 01/10/2015  .  Allergic rhinitis, seasonal 01/10/2015  . History of anemia 01/10/2015  . Excessive and frequent menstruation with irregular cycle 01/10/2015  . Asymptomatic varicose veins of both lower extremities 01/10/2015  . Neck pain, musculoskeletal 01/10/2015  . Hand numbness 09/28/2014  . Hand weakness 09/28/2014    Social History   Tobacco Use  . Smoking status: Never Smoker  . Smokeless tobacco: Never Used  Substance Use Topics  . Alcohol use: No    Alcohol/week: 0.0 standard drinks     Current Outpatient Medications:  .  albuterol (VENTOLIN HFA) 108 (90 Base) MCG/ACT inhaler, Inhale 2 puffs into the lungs every 6 (six) hours as needed for wheezing or shortness of breath., Disp: 1 Inhaler, Rfl: 0 .  famotidine (PEPCID) 20 MG tablet, Take 1 tablet (20 mg total) by mouth 2 (two) times daily., Disp: 60 tablet, Rfl: 2 .  Vitamin D, Ergocalciferol, (DRISDOL) 1.25 MG (50000 UT) CAPS capsule, Take 50,000 Units by mouth every 7 (seven) days., Disp: , Rfl:   No Known Allergies  I personally reviewed active problem list, medication list, allergies, notes from last encounter, lab results with the patient/caregiver today.  ROS  Ten systems reviewed and is negative except as mentioned in HPI  Objective  Vitals:   11/23/18 1020  BP: 130/84  Pulse: 71  Resp: 14  Temp: 97.9 F (36.6 C)  TempSrc: Oral  SpO2: 95%  Weight: 195 lb 12.8 oz (88.8 kg)  Height: 5\' 8"  (1.727 m)    Body mass  index is 29.77 kg/m.  Nursing Note and Vital Signs reviewed.  Physical Exam  Constitutional: Patient appears well-developed and well-nourished. No distress.  HENT: Head: Normocephalic and atraumatic. Eyes: Conjunctivae and EOM are normal. Cardiovascular: Normal rate, regular rhythm and normal heart sounds.  No murmur heard. No BLE edema. Pulmonary/Chest: Effort normal and breath sounds normal. No respiratory distress. Breast: No nipple discharge or rashes. Normal nipples.  There is an ill-defined lump  to the 3:00 position of the left breast Musculoskeletal: Normal range of motion, no joint effusions. No gross deformities Neurological: he is alert and oriented to person, place, and time. No cranial nerve deficit. Coordination, balance, strength, speech and gait are normal.  Skin: Skin is warm and dry. No rash noted. No erythema.  Psychiatric: Patient has a normal mood and affect. behavior is normal. Judgment and thought content normal.  No results found for this or any previous visit (from the past 72 hour(s)).  Assessment & Plan  1. Breast lump on left side at 3 o'clock position - MM Digital Diagnostic Unilat L; Future - MM Digital Diagnostic Unilat R; Future  2. Irregular periods/menstrual cycles - Ambulatory referral to Gynecology  3. Status post hysteroscopic ablation of endometrium - Ambulatory referral to Gynecology  -Red flags and when to present for emergency care or RTC including fever >101.21F, chest pain, shortness of breath, new/worsening/un-resolving symptoms, reviewed with patient at time of visit. Follow up and care instructions discussed and provided in AVS.

## 2018-11-24 ENCOUNTER — Other Ambulatory Visit: Payer: Self-pay | Admitting: Family Medicine

## 2018-11-24 ENCOUNTER — Encounter: Payer: Self-pay | Admitting: Family Medicine

## 2018-11-24 DIAGNOSIS — N6325 Unspecified lump in the left breast, overlapping quadrants: Secondary | ICD-10-CM

## 2018-11-24 DIAGNOSIS — Z1231 Encounter for screening mammogram for malignant neoplasm of breast: Secondary | ICD-10-CM

## 2018-11-25 ENCOUNTER — Ambulatory Visit: Payer: Managed Care, Other (non HMO) | Admitting: Nurse Practitioner

## 2018-11-25 ENCOUNTER — Ambulatory Visit: Payer: Managed Care, Other (non HMO) | Admitting: Family Medicine

## 2018-11-26 ENCOUNTER — Ambulatory Visit (INDEPENDENT_AMBULATORY_CARE_PROVIDER_SITE_OTHER): Payer: Managed Care, Other (non HMO) | Admitting: Obstetrics and Gynecology

## 2018-11-26 ENCOUNTER — Other Ambulatory Visit: Payer: Self-pay

## 2018-11-26 ENCOUNTER — Encounter: Payer: Self-pay | Admitting: Obstetrics and Gynecology

## 2018-11-26 VITALS — BP 134/81 | HR 74 | Ht 68.0 in | Wt 196.0 lb

## 2018-11-26 DIAGNOSIS — Z01419 Encounter for gynecological examination (general) (routine) without abnormal findings: Secondary | ICD-10-CM

## 2018-11-26 NOTE — Progress Notes (Signed)
HPI:      [name] is a 48 y.o. [id number] who LMP was No LMP recorded. Patient has had an ablation.  Subjective:   She presents today for her annual examination.  She sees her PCP for lab work and has recently seen her PCP for a left breast concern.  Patient felt a painful tender spot in her left breast.  A mammogram has been ordered. Of significant note patient had endometrial ablation surgery approximately 3 years ago and she has occasional spotting but her bleeding problems have generally resolved.  She is currently not using any birth control and has not since the ablation.    Hx: The following portions of the patient's history were reviewed and updated as appropriate:             She  has a past medical history of Abnormal thyroid stimulating hormone level, Allergy, Anemia, Encounter for initial prescription of injectable contraceptive, Female stress incontinence, High cholesterol, History of ectopic pregnancy (11/29/2015), Menorrhagia with irregular cycle, Ovarian cyst (11/29/2015), and Spider veins of both lower extremities. She does not have any pertinent problems on file. She  has a past surgical history that includes Cholecystectomy (N/A, 01/19/2016); laser surgery on eye (Right, 10/2016); and Dilatation & currettage/hysteroscopy with novasure ablation (N/A, 03/24/2017). Her family history includes Cancer in her father, paternal grandfather, and paternal uncle; Diabetes in her brother, maternal grandmother, sister, and another family member; Hypertension in her father and mother; Kidney disease in her mother; Schizophrenia in her sister; Thyroid disease in her mother. She  reports that she has never smoked. She has never used smokeless tobacco. She reports that she does not drink alcohol or use drugs. She has a current medication list which includes the following prescription(s): albuterol and famotidine. She has No Known Allergies.       Review of Systems:  Review of  Systems  Constitutional: Denied constitutional symptoms, night sweats, recent illness, fatigue, fever, insomnia and weight loss.  Eyes: Denied eye symptoms, eye pain, photophobia, vision change and visual disturbance.  Ears/Nose/Throat/Neck: Denied ear, nose, throat or neck symptoms, hearing loss, nasal discharge, sinus congestion and sore throat.  Cardiovascular: Denied cardiovascular symptoms, arrhythmia, chest pain/pressure, edema, exercise intolerance, orthopnea and palpitations.  Respiratory: Denied pulmonary symptoms, asthma, pleuritic pain, productive sputum, cough, dyspnea and wheezing.  Gastrointestinal: Denied, gastro-esophageal reflux, melena, nausea and vomiting.  Genitourinary: Denied genitourinary symptoms including symptomatic vaginal discharge, pelvic relaxation issues, and urinary complaints.  Musculoskeletal: Denied musculoskeletal symptoms, stiffness, swelling, muscle weakness and myalgia.  Dermatologic: Denied dermatology symptoms, rash and scar.  Neurologic: Denied neurology symptoms, dizziness, headache, neck pain and syncope.  Psychiatric: Denied psychiatric symptoms, anxiety and depression.  Endocrine: Denied endocrine symptoms including hot flashes and night sweats.   Meds:   Current Outpatient Medications on File Prior to Visit  Medication Sig Dispense Refill  . albuterol (VENTOLIN HFA) 108 (90 Base) MCG/ACT inhaler Inhale 2 puffs into the lungs every 6 (six) hours as needed for wheezing or shortness of breath. 1 Inhaler 0  . famotidine (PEPCID) 20 MG tablet Take 1 tablet (20 mg total) by mouth 2 (two) times daily. 60 tablet 2   No current facility-administered medications on file prior to visit.     Objective:     Vitals:   11/26/18 1524  BP: 134/81  Pulse: 74              Physical examination General NAD, Conversant  HEENT Atraumatic; Op clear with mmm.  Normo-cephalic. Pupils reactive. Anicteric sclerae  Thyroid/Neck Smooth without nodularity or  enlargement. Normal ROM.  Neck Supple.  Skin No rashes, lesions or ulceration. Normal palpated skin turgor. No nodularity.  Breasts: No masses or discharge.  Symmetric.  No axillary adenopathy.  Lungs: Clear to auscultation.No rales or wheezes. Normal Respiratory effort, no retractions.  Heart: NSR.  No murmurs or rubs appreciated. No periferal edema  Abdomen: Soft.  Non-tender.  No masses.  No HSM. No hernia  Extremities: Moves all appropriately.  Normal ROM for age. No lymphadenopathy.  Neuro: Oriented to PPT.  Normal mood. Normal affect.     Pelvic:   Vulva: Normal appearance.  No lesions.  Vagina: No lesions or abnormalities noted.  Support: Normal pelvic support.  Urethra No masses tenderness or scarring.  Meatus Normal size without lesions or prolapse.  Cervix: Normal appearance.  No lesions.  Anus: Normal exam.  No lesions.  Perineum: Normal exam.  No lesions.        Bimanual   Uterus: Normal size.  Non-tender.  Mobile.  AV.  Adnexae: No masses.  Non-tender to palpation.  Cul-de-sac: Negative for abnormality.      Assessment:    [id number] Patient Active Problem List   Diagnosis Date Noted  . Absolute anemia 01/09/2018  . 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 Primary osteoarthritis of right knee 05/13/2017  . Status post hysteroscopy/D&C with NovaSure endometrial ablation-postop check 04/02/2017  . Status post D&C/hysteroscopy/endometrial ablation 03/24/2017  . Overweight (BMI 25.0-29.9) 11/19/2016  . Acute thoracic back pain 07/23/2016  . Neutropenia (HCC) 07/23/2016  . White coat hypertension 01/25/2016  . Dyspareunia, female 01/03/2016  . Dysmenorrhea 01/03/2016  . Menorrhagia with regular cycle 01/03/2016  . High cholesterol 01/01/2016  . Preventative health care 12/08/2015  . Gallstones 12/05/2015  . Irregular periods/menstrual cycles 11/29/2015  . Abdominal pain 11/29/2015  . Ovarian cyst 11/29/2015  . History of ectopic pregnancy 11/29/2015  .  Breast cancer screening 09/21/2015  . Proteinuria 08/14/2015  . Right knee sprain 07/19/2015  . Abnormal thyroid stimulating hormone (TSH) level 01/10/2015  . Allergic rhinitis, seasonal 01/10/2015  . History of anemia 01/10/2015  . Excessive and frequent menstruation with irregular cycle 01/10/2015  . Asymptomatic varicose veins of both lower extremities 01/10/2015  . Neck pain, musculoskeletal 01/10/2015  . Hand numbness 09/28/2014  . Hand weakness 09/28/2014     1. Well woman exam with routine gynecological exam     I can palpate no abnormality in the left breast.  Patient is however scheduled for mammography.  Patient is not currently using any birth control methods and relying on her endometrial ablation.   Plan:            1.  Basic Screening Recommendations The basic screening recommendations for asymptomatic women were discussed with the patient during her visit.  The age-appropriate recommendations were discussed with her and the rational for the tests reviewed.  When I am informed by the patient that another primary care physician has previously obtained the age-appropriate tests and they are up-to-date, only outstanding tests are ordered and referrals given as necessary.  Abnormal results of tests will be discussed with her when all of her results are completed. Mammogram as scheduled-Pap smear next year basic screening tests performed by PCP in December. 2.  Birth control and endometrial relation discussed in detail.  I believe based on her age and her ablation her risk of pregnancy is low but not 0.  We have discussed the ACOG  recommendations of birth control for endometrial ablation patients.  We have discussed them this in light of her age as well and I believe her risk for pregnancy is low.  Patient is not interested in additional birth control at this time. Orders No orders of the defined types were placed in this encounter.   No orders of the defined types were placed  in this encounter.       F/U  No follow-ups on file.  Finis Bud, M.D. 11/26/2018 3:51 PM

## 2018-11-26 NOTE — Progress Notes (Signed)
Patient comes in today for yearly physical. She had PAP 2018 that was normal. Mammogram and [place] tomorrow due to lump being found in left breast. Labs through PCP.

## 2018-11-27 ENCOUNTER — Ambulatory Visit: Payer: Managed Care, Other (non HMO) | Admitting: Family Medicine

## 2018-11-27 ENCOUNTER — Other Ambulatory Visit: Payer: Self-pay | Admitting: Family Medicine

## 2018-11-27 ENCOUNTER — Ambulatory Visit
Admission: RE | Admit: 2018-11-27 | Discharge: 2018-11-27 | Disposition: A | Discharge status: Home / Self Care | Payer: Managed Care, Other (non HMO) | Source: Ambulatory Visit | Attending: Family Medicine | Admitting: Family Medicine

## 2018-11-27 DIAGNOSIS — Z1231 Encounter for screening mammogram for malignant neoplasm of breast: Secondary | ICD-10-CM

## 2018-11-27 DIAGNOSIS — N6310 Unspecified lump in the right breast, unspecified quadrant: Secondary | ICD-10-CM

## 2018-11-27 DIAGNOSIS — N6325 Unspecified lump in the left breast, overlapping quadrants: Secondary | ICD-10-CM

## 2018-11-27 DIAGNOSIS — N6320 Unspecified lump in the left breast, unspecified quadrant: Secondary | ICD-10-CM | POA: Diagnosis present

## 2018-12-30 ENCOUNTER — Other Ambulatory Visit: Payer: Self-pay

## 2018-12-30 ENCOUNTER — Ambulatory Visit (INDEPENDENT_AMBULATORY_CARE_PROVIDER_SITE_OTHER): Payer: Managed Care, Other (non HMO) | Admitting: Nurse Practitioner

## 2018-12-30 ENCOUNTER — Encounter: Payer: Self-pay | Admitting: Nurse Practitioner

## 2018-12-30 VITALS — BP 108/62 | HR 65 | Temp 96.9°F | Resp 16 | Ht 67.5 in | Wt 200.1 lb

## 2018-12-30 DIAGNOSIS — Z0000 Encounter for general adult medical examination without abnormal findings: Secondary | ICD-10-CM

## 2018-12-30 DIAGNOSIS — R110 Nausea: Secondary | ICD-10-CM

## 2018-12-30 DIAGNOSIS — R7989 Other specified abnormal findings of blood chemistry: Secondary | ICD-10-CM | POA: Diagnosis not present

## 2018-12-30 DIAGNOSIS — D709 Neutropenia, unspecified: Secondary | ICD-10-CM

## 2018-12-30 DIAGNOSIS — Z862 Personal history of diseases of the blood and blood-forming organs and certain disorders involving the immune mechanism: Secondary | ICD-10-CM

## 2018-12-30 DIAGNOSIS — G4733 Obstructive sleep apnea (adult) (pediatric): Secondary | ICD-10-CM

## 2018-12-30 MED ORDER — ONDANSETRON HCL 4 MG PO TABS: 4.0000 mg | ORAL_TABLET | Freq: Three times a day (TID) | ORAL | 0 refills | Status: DC | PRN

## 2018-12-30 NOTE — Progress Notes (Signed)
Name: Andrea Pope   MRN: [id number]    DOB: [birth year]   Date:12/30/2018       Progress Note  Subjective  Chief Complaint  Chief Complaint  Patient presents with  . Annual Exam    sleeps about 6-8 hrs/ night, well balanced diet, lots of water and exercises  . Labs Only    patient is not fasting    HPI  Patient presents for annual CPE- just saw GYN last month for her annual there.  Diet:  Meals a day: 3 full meals with some snack  Fruits a day: twice daily  Vegetables a day: 2+ daily  Water intake:  At least 3 bottles of water a day  Nuts, eggs, meats  Exercise:  Walks 8k-10k 4 times a week.   USPSTF grade A and B recommendations    [facility] Visit from 12/30/2018 in [facility]  AUDIT-C Score  0     Depression: Phq 9 is  negative Depression screen [facility] 2/9 12/30/2018 11/23/2018 10/21/2018 09/09/2018 08/26/2018  Decreased Interest 0 0 0 0 0  Down, Depressed, Hopeless 0 0 0 0 0  PHQ - 2 Score 0 0 0 0 0  Altered sleeping 0 0 0 - -  Tired, decreased energy 0 0 0 - -  Change in appetite 0 0 0 - -  Feeling bad or failure about yourself  0 0 0 - -  Trouble concentrating 0 0 0 - -  Moving slowly or fidgety/restless 0 0 0 - -  Suicidal thoughts 0 0 0 - -  PHQ-9 Score 0 0 0 - -  Difficult doing work/chores Not difficult at all Not difficult at all Not difficult at all - -  Some recent data might be hidden   Hypertension: BP Readings from Last 3 Encounters:  12/30/18 108/62  11/26/18 134/81  11/23/18 130/84   Obesity: Wt Readings from Last 3 Encounters:  12/30/18 200 lb 1.6 oz (90.8 kg)  11/26/18 196 lb (88.9 kg)  11/23/18 195 lb 12.8 oz (88.8 kg)   BMI Readings from Last 3 Encounters:  12/30/18 30.88 kg/m  11/26/18 29.80 kg/m  11/23/18 29.77 kg/m    Hep C Screening: declines  STD testing and prevention (HIV/chl/gon/syphilis): declines Intimate partner violence: denies  Sexual History/Pain during Intercourse: denies  Menstrual  History/LMP/Abnormal Bleeding:  Ablation- resolved  Incontinence Symptoms: denies   Breast cancer:  - Last Mammogram: 11/27/2018 Fibrocystic changes.  No evidence of malignancy Cervical cancer screening: due next year Skin cancer: discussed   Lipids:  Lab Results  Component Value Date   CHOL 230 (H) 04/28/2018   CHOL 236 (H) 12/05/2017   CHOL 205 (H) 11/19/2016   Lab Results  Component Value Date   HDL 59 04/28/2018   HDL 64 12/05/2017   HDL 60 11/19/2016   Lab Results  Component Value Date   LDLCALC 153 (H) 04/28/2018   LDLCALC 158 (H) 12/05/2017   LDLCALC 124 (H) 11/19/2016   Lab Results  Component Value Date   TRIG 78 04/28/2018   TRIG 51 12/05/2017   TRIG 103 11/19/2016   Lab Results  Component Value Date   CHOLHDL 3.9 04/28/2018   CHOLHDL 3.7 12/05/2017   CHOLHDL 3.4 11/19/2016   No results found for: LDLDIRECT  Glucose:  Glucose, Bld  Date Value Ref Range Status  12/05/2017 79 65 - 99 mg/dL Final    Comment:    .  Fasting reference interval .   11/19/2016 100 (H) 65 - 99 mg/dL Final  07/23/2016 73 65 - 99 mg/dL Final      Patient Active Problem List   Diagnosis Date Noted  . Absolute anemia 01/09/2018  . Vitamin B12 deficiency 01/08/2018  . Vitamin D deficiency 01/08/2018  . Primary osteoarthritis of right knee 05/13/2017  . Overweight (BMI 25.0-29.9) 11/19/2016  . Neutropenia ([place]) 07/23/2016  . White coat hypertension 01/25/2016  . Dysmenorrhea 01/03/2016  . High cholesterol 01/01/2016  . Gallstones 12/05/2015  . Ovarian cyst 11/29/2015  . History of ectopic pregnancy 11/29/2015  . Proteinuria 08/14/2015  . Abnormal thyroid stimulating hormone (TSH) level 01/10/2015  . Allergic rhinitis, seasonal 01/10/2015  . Asymptomatic varicose veins of both lower extremities 01/10/2015    Past Surgical History:  Procedure Laterality Date  . CHOLECYSTECTOMY N/A 01/19/2016   Procedure: LAPAROSCOPIC CHOLECYSTECTOMY WITH INTRAOPERATIVE  CHOLANGIOGRAM;  Surgeon: Christene Lye, MD;  Location: [facility] ORS;  Service: General;  Laterality: N/A;  . DILITATION & CURRETTAGE/HYSTROSCOPY WITH NOVASURE ABLATION N/A 03/24/2017   Procedure: DILATATION & CURETTAGE/HYSTEROSCOPY WITH NOVASURE ABLATION;  Surgeon: Brayton Mars, MD;  Location: [facility] ORS;  Service: Gynecology;  Laterality: N/A;  . laser surgery on eye Right 10/2016    Family History  Problem Relation Age of Onset  . Hypertension Mother   . Thyroid disease Mother   . Kidney disease Mother   . Hypertension Father   . Cancer Father        Prostate  . Diabetes Brother   . Diabetes Other   . Cancer Paternal Uncle        Prostate  . Diabetes Maternal Grandmother   . Cancer Paternal Grandfather        Prostate  . Diabetes Sister   . Schizophrenia Sister   . Breast cancer Neg Hx   . Ovarian cancer Neg Hx   . Heart disease Neg Hx     Social History   Socioeconomic History  . Marital status: Married    Spouse name: Ron   . Number of children: 4  . Years of education: Not on file  . Highest education level: Bachelor's degree (e.g., BA, AB, BS)  Occupational History  . Occupation: Pharmacist, hospital  Social Needs  . Financial resource strain: Not hard at all  . Food insecurity    Worry: Never true    Inability: Never true  . Transportation needs    Medical: No    Non-medical: No  Tobacco Use  . Smoking status: Never Smoker  . Smokeless tobacco: Never Used  Substance and Sexual Activity  . Alcohol use: No    Alcohol/week: 0.0 standard drinks  . Drug use: No  . Sexual activity: Yes    Partners: Male    Birth control/protection: None  Lifestyle  . Physical activity    Days per week: 5 days    Minutes per session: 60 min  . Stress: Not at all  Relationships  . Social connections    Talks on phone: More than three times a week    Gets together: More than three times a week    Attends religious service: 1 to 4 times per year    Active member of club or  organization: Yes    Attends meetings of clubs or organizations: 1 to 4 times per year    Relationship status: Married  . Intimate partner violence    Fear of current or ex partner: No  Emotionally abused: No    Physically abused: No    Forced sexual activity: No  Other Topics Concern  . Not on file  Social History Narrative  . Not on file    No current outpatient medications on file.  No Known Allergies   Review of Systems  Constitutional: Negative for chills, fever and malaise/fatigue.  HENT: Negative for congestion, sinus pain and sore throat.   Eyes: Negative for blurred vision.  Respiratory: Negative for cough and shortness of breath.   Cardiovascular: Negative for chest pain, palpitations and leg swelling.  Gastrointestinal: Positive for nausea (resolved with eating). Negative for abdominal pain, blood in stool, constipation and diarrhea.  Genitourinary: Negative for dysuria.  Musculoskeletal: Negative for falls and joint pain.  Skin: Negative for rash.  Neurological: Negative for dizziness and headaches.  Endo/Heme/Allergies: Negative for polydipsia.  Psychiatric/Behavioral: The patient is not nervous/anxious and does not have insomnia.      Objective  Vitals:   12/30/18 1059  BP: 108/62  Pulse: 65  Resp: 16  Temp: (!) 96.9 F (36.1 C)  TempSrc: Temporal  SpO2: 99%  Weight: 200 lb 1.6 oz (90.8 kg)  Height: 5' 7.5" (1.715 m)    Body mass index is 30.88 kg/m.  Physical Exam Constitutional: Patient appears well-developed and well-nourished. No distress.  HENT: Head: Normocephalic and atraumatic. Ears: B TMs ok, no erythema or effusion; Eyes: Conjunctivae and EOM are normal. Pupils are equal, round, and reactive to light. No scleral icterus.  Neck: Normal range of motion. Neck supple. No JVD present. No thyromegaly present.  Cardiovascular: Normal rate, regular rhythm and normal heart sounds.  No murmur heard. No BLE edema. Pulmonary/Chest: Effort  normal and breath sounds normal. No respiratory distress. Abdominal: Soft. Bowel sounds are normal, no distension. There is no tenderness. no masses Breast & FEMALE GENITALIA: deferred Musculoskeletal: Normal range of motion, no joint effusions. No gross deformities Neurological: he is alert and oriented to person, place, and time. No cranial nerve deficit. Coordination, balance, strength, speech and gait are normal.  Skin: Skin is warm and dry. No rash noted. No erythema.  Psychiatric: Patient has a normal mood and affect. behavior is normal. Judgment and thought content normal.   No results found for this or any previous visit (from the past 2160 hour(s)).   Fall Risk: Fall Risk  12/30/2018 11/23/2018 10/21/2018 09/09/2018 08/26/2018  Falls in the past year? 0 0 0 0 0  Number falls in past yr: 0 0 0 0 0  Injury with Fall? 0 0 0 0 0  Follow up - Falls evaluation completed Falls evaluation completed - -     Functional Status Survey: Is the patient deaf or have difficulty hearing?: No Does the patient have difficulty seeing, even when wearing glasses/contacts?: No Does the patient have difficulty concentrating, remembering, or making decisions?: No Does the patient have difficulty walking or climbing stairs?: No Does the patient have difficulty dressing or bathing?: No Does the patient have difficulty doing errands alone such as visiting a doctor's office or shopping?: No   Assessment & Plan 1. Preventative health care - Lipid Profile - COMPLETE METABOLIC PANEL WITH GFR - CBC w/Diff/Platelet - TSH  2. History of anemia - CBC w/Diff/Platelet  3. Abnormal thyroid stimulating hormone (TSH) level - TSH  4. Neutropenia, unspecified type (HCC) - CBC w/Diff/Platelet  5. Nausea Last 2 weeks relieved by food; if not improving or worsening needs follow-up - H. pylori breath test - COMPLETE METABOLIC PANEL  WITH GFR - CBC w/Diff/Platelet 6. Obstructive sleep apnea Getting cpap fitted  next month    -USPSTF grade A and B recommendations reviewed with patient; age-appropriate recommendations, preventive care, screening tests, etc discussed and encouraged; healthy living encouraged; see AVS for patient education given to patient -Discussed importance of 150 minutes of physical activity weekly, eat two servings of fish weekly, eat one serving of tree nuts ( cashews, pistachios, pecans, almonds.Marland Kitchen.) every other day, eat 6 servings of fruit/vegetables daily and drink plenty of water and avoid sweet beverages.   -Reviewed Health Maintenance:  Recommend flu shot in one month

## 2018-12-30 NOTE — Patient Instructions (Signed)
-   flu shot end up September to mid October  General recommendations: 150 minutes of physical activity weekly, eat two servings of fish weekly, eat one serving of tree nuts ( cashews, pistachios, pecans, almonds.Marland Kitchen) every other day, eat 6 servings of fruit/vegetables daily and drink plenty of water and avoid sweet beverages.  Recommend drinking at least 64 ounces of water a day.   Stay Safe in the [place] The majority of sun exposure occurs before age 55 and skin cancer can take 20 years or more to develop. Whether your sun bathing days are behind you or you still spend time pursuing the perfect tan, you should be concerned about skin cancer.  Remember, the sun's ultraviolet (UV) rays can reflect off water, sand, concrete and snow, and can reach below the water's surface. Certain types of UV light penetrate fog and clouds, so it's possible to get sunburn even on overcast days.  Avoid direct sunlight as much as possible during the peak sun hours, generally 10 a.m. to 3 p.m., or seek shade during this part of day. Wear broad-spectrum sunscreen - with an SPF of at least 30 - containing both UVA and UVB protection. Look for ingredients like [employer] (also known as avobenzone) or titanium dioxide on the label. Reapply sunscreen frequently, at least every two hours when outdoors, especially if you perspire or you've been swimming. Your best bet is to choose water-resistant products that are more likely to stay on your skin. Wear lip balm with an SPF 15 or higher. Wear a hat and other protective clothing while in the sun. Tightly woven fibers and darker clothing generally provide more protection. Also, look for products approved by the American Academy of Dermatology. Wear UV-protective sunglasses.

## 2018-12-31 LAB — CBC WITH DIFFERENTIAL/PLATELET
Absolute Monocytes: 486 cells/uL (ref 200–950)
Basophils Absolute: 22 cells/uL (ref 0–200)
Basophils Relative: 0.4 %
Eosinophils Absolute: 189 cells/uL (ref 15–500)
Eosinophils Relative: 3.5 %
HCT: 39.2 % (ref 35.0–45.0)
Hemoglobin: 13.2 g/dL (ref 11.7–15.5)
Lymphs Abs: 1701 cells/uL (ref 850–3900)
MCH: 30.3 pg (ref 27.0–33.0)
MCHC: 33.7 g/dL (ref 32.0–36.0)
MCV: 90.1 fL (ref 80.0–100.0)
MPV: 11.3 fL (ref 7.5–12.5)
Monocytes Relative: 9 %
Neutro Abs: 3002 cells/uL (ref 1500–7800)
Neutrophils Relative %: 55.6 %
Platelets: 288 10*3/uL (ref 140–400)
RBC: 4.35 10*6/uL (ref 3.80–5.10)
RDW: 12.7 % (ref 11.0–15.0)
Total Lymphocyte: 31.5 %
WBC: 5.4 10*3/uL (ref 3.8–10.8)

## 2018-12-31 LAB — LIPID PANEL
Cholesterol: 188 mg/dL (ref <200)
HDL: 61 mg/dL (ref >=50)
LDL Cholesterol (Calc): 103 mg/dL (calc) — ABNORMAL HIGH
Non-HDL Cholesterol (Calc): 127 mg/dL (calc) (ref <130)
Total CHOL/HDL Ratio: 3.1 (calc) (ref <5.0)
Triglycerides: 144 mg/dL (ref <150)

## 2018-12-31 LAB — COMPLETE METABOLIC PANEL WITH GFR
AG Ratio: 1.6 (calc) (ref 1.0–2.5)
ALT: 8 U/L (ref 6–29)
AST: 10 U/L (ref 10–35)
Albumin: 3.9 g/dL (ref 3.6–5.1)
Alkaline phosphatase (APISO): 50 U/L (ref 31–125)
BUN: 11 mg/dL (ref 7–25)
CO2: 24 mmol/L (ref 20–32)
Calcium: 8.9 mg/dL (ref 8.6–10.2)
Chloride: 105 mmol/L (ref 98–110)
Creat: 0.79 mg/dL (ref 0.50–1.10)
GFR, Est African American: 104 mL/min/{1.73_m2} (ref >=60)
GFR, Est Non African American: 90 mL/min/{1.73_m2} (ref >=60)
Globulin: 2.5 g/dL (calc) (ref 1.9–3.7)
Glucose, Bld: 81 mg/dL (ref 65–99)
Potassium: 4.2 mmol/L (ref 3.5–5.3)
Sodium: 136 mmol/L (ref 135–146)
Total Bilirubin: 0.3 mg/dL (ref 0.2–1.2)
Total Protein: 6.4 g/dL (ref 6.1–8.1)

## 2018-12-31 LAB — H. PYLORI BREATH TEST: H. pylori Breath Test: NOT DETECTED

## 2018-12-31 LAB — TSH: TSH: 0.65 mIU/L

## 2019-02-05 ENCOUNTER — Other Ambulatory Visit: Payer: Self-pay

## 2019-02-05 DIAGNOSIS — Z20822 Contact with and (suspected) exposure to covid-19: Secondary | ICD-10-CM

## 2019-02-06 LAB — NOVEL CORONAVIRUS, NAA: SARS-CoV-2, NAA: NOT DETECTED

## 2019-02-24 ENCOUNTER — Other Ambulatory Visit: Payer: Self-pay

## 2019-02-24 ENCOUNTER — Ambulatory Visit (INDEPENDENT_AMBULATORY_CARE_PROVIDER_SITE_OTHER): Payer: Managed Care, Other (non HMO)

## 2019-02-24 DIAGNOSIS — Z23 Encounter for immunization: Secondary | ICD-10-CM | POA: Diagnosis not present

## 2019-03-26 ENCOUNTER — Ambulatory Visit (INDEPENDENT_AMBULATORY_CARE_PROVIDER_SITE_OTHER): Payer: Managed Care, Other (non HMO) | Admitting: Family Medicine

## 2019-03-26 ENCOUNTER — Encounter: Payer: Self-pay | Admitting: Family Medicine

## 2019-03-26 ENCOUNTER — Other Ambulatory Visit: Payer: Self-pay

## 2019-03-26 VITALS — BP 126/80 | HR 65 | Temp 97.8°F | Resp 16 | Ht 68.0 in | Wt 194.4 lb

## 2019-03-26 DIAGNOSIS — J301 Allergic rhinitis due to pollen: Secondary | ICD-10-CM

## 2019-03-26 DIAGNOSIS — R7989 Other specified abnormal findings of blood chemistry: Secondary | ICD-10-CM | POA: Diagnosis not present

## 2019-03-26 DIAGNOSIS — G4733 Obstructive sleep apnea (adult) (pediatric): Secondary | ICD-10-CM

## 2019-03-26 DIAGNOSIS — F330 Major depressive disorder, recurrent, mild: Secondary | ICD-10-CM | POA: Insufficient documentation

## 2019-03-26 DIAGNOSIS — D508 Other iron deficiency anemias: Secondary | ICD-10-CM

## 2019-03-26 DIAGNOSIS — N6325 Unspecified lump in the left breast, overlapping quadrants: Secondary | ICD-10-CM

## 2019-03-26 DIAGNOSIS — E559 Vitamin D deficiency, unspecified: Secondary | ICD-10-CM

## 2019-03-26 DIAGNOSIS — E663 Overweight: Secondary | ICD-10-CM | POA: Diagnosis not present

## 2019-03-26 DIAGNOSIS — D709 Neutropenia, unspecified: Secondary | ICD-10-CM | POA: Diagnosis not present

## 2019-03-26 DIAGNOSIS — E538 Deficiency of other specified B group vitamins: Secondary | ICD-10-CM

## 2019-03-26 DIAGNOSIS — L659 Nonscarring hair loss, unspecified: Secondary | ICD-10-CM

## 2019-03-26 MED ORDER — NALTREXONE-BUPROPION HCL ER 8-90 MG PO TB12: ORAL_TABLET | ORAL | 3 refills | Status: DC

## 2019-03-26 NOTE — Progress Notes (Signed)
Name: Andrea Pope   MRN: [id number]    DOB: [birth year]   Date:03/26/2019       Progress Note  Subjective  Chief Complaint  Chief Complaint  Patient presents with  . Follow-up    6 month recheck    HPI  Neutropenia/Anemia:  Has history; last labs were normal; energy levels normal, no concerns.  Overweight: She is frustrated with her weight loss journey.  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  Diet is balanced and healthy. Discussed weight loss medications - she would like to try Contrave.  We will rx today. Body mass index is 29.56 kg/m.  History Abnormal TSH: Last check was normal.  Her mother has history hypothyroidism.  Does get occasional palpitations (when she over exerts); denies skin or nail changes.  Has ongoing hair thinning/shedding and some tracks of tension alopecia.  Would like to see dermatology for evaluation.  Hx Breast lump: She had multiple cystic lesions found on US bilateral breasts.  Was told no additional biopsy or follow up aside from routine screening needed.   B12 and Vit D deficiency:  Stopped the B12 shots - ran out after not following with Dr. Renard MatterBock.  Also stopped Vitamin D.  Discussed adding supplements back in to routine.  Bilateral hand arthralgia: Was seeing Dr. Renard MatterBock - lost follow up after about a year, but is still having weakness and pain in her hands.  Did have +ANA, but no diagnosis of Lupus or rheumatoid issue yet.  We will re-refer today per her request.  OSA: Did sleep study at home, was set up to get her CPAP machine, but is now waiting until first of the year to have this set up.  Patient Active Problem List   Diagnosis Date Noted  . 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 Obstructive sleep apnea 12/30/2018  . Absolute anemia 01/09/2018  . Vitamin B12 deficiency 01/08/2018  . Vitamin D deficiency 01/08/2018  . Primary osteoarthritis  of right knee 05/13/2017  . Overweight (BMI 25.0-29.9) 11/19/2016  . Neutropenia (HCC) 07/23/2016  . White coat hypertension 01/25/2016  . Dysmenorrhea 01/03/2016  . High cholesterol 01/01/2016  . Gallstones 12/05/2015  . Ovarian cyst 11/29/2015  . History of ectopic pregnancy 11/29/2015  . Proteinuria 08/14/2015  . Abnormal thyroid stimulating hormone (TSH) level 01/10/2015  . Allergic rhinitis, seasonal 01/10/2015  . Asymptomatic varicose veins of both lower extremities 01/10/2015    Past Surgical History:  Procedure Laterality Date  . CHOLECYSTECTOMY N/A 01/19/2016   Procedure: LAPAROSCOPIC CHOLECYSTECTOMY WITH INTRAOPERATIVE CHOLANGIOGRAM;  Surgeon: Kieth BrightlySeeplaputhur G Sankar, MD;  Location: [facility] ORS;  Service: General;  Laterality: N/A;  . DILITATION & CURRETTAGE/HYSTROSCOPY WITH NOVASURE ABLATION N/A 03/24/2017   Procedure: DILATATION & CURETTAGE/HYSTEROSCOPY WITH NOVASURE ABLATION;  Surgeon: Herold Harmsefrancesco, Martin A, MD;  Location: [facility] ORS;  Service: Gynecology;  Laterality: N/A;  . laser surgery on eye Right 10/2016    Family History  Problem Relation Age of Onset  . Hypertension Mother   . Thyroid disease Mother   . Kidney disease Mother   . Hypertension Father   . Cancer Father        Prostate  . Diabetes Brother   . Diabetes Other   . Cancer Paternal Uncle        Prostate  . Diabetes Maternal Grandmother   . Cancer Paternal Grandfather        Prostate  .  Diabetes Sister   . Schizophrenia Sister   . Breast cancer Neg Hx   . Ovarian cancer Neg Hx   . Heart disease Neg Hx     Social History   Socioeconomic History  . Marital status: Married    Spouse name: Ron   . Number of children: 4  . Years of education: Not on file  . Highest education level: Bachelor's degree (e.g., BA, AB, BS)  Occupational History  . Occupation: Pharmacist, hospital  Social Needs  . Financial resource strain: Not hard at all  . Food insecurity    Worry: Never true    Inability: Never true  .  Transportation needs    Medical: No    Non-medical: No  Tobacco Use  . Smoking status: Never Smoker  . Smokeless tobacco: Never Used  Substance and Sexual Activity  . Alcohol use: No    Alcohol/week: 0.0 standard drinks  . Drug use: No  . Sexual activity: Yes    Partners: Male    Birth control/protection: None  Lifestyle  . Physical activity    Days per week: 5 days    Minutes per session: 60 min  . Stress: Not at all  Relationships  . Social connections    Talks on phone: More than three times a week    Gets together: More than three times a week    Attends religious service: 1 to 4 times per year    Active member of club or organization: Yes    Attends meetings of clubs or organizations: 1 to 4 times per year    Relationship status: Married  . Intimate partner violence    Fear of current or ex partner: No    Emotionally abused: No    Physically abused: No    Forced sexual activity: No  Other Topics Concern  . Not on file  Social History Narrative  . Not on file    No current outpatient medications on file.  No Known Allergies  I personally reviewed active problem list, medication list, allergies, notes from last encounter, lab results with the patient/caregiver today.   ROS  Constitutional: Negative for fever or weight change.  Respiratory: Negative for cough and shortness of breath.   Cardiovascular: Negative for chest pain or palpitations.  Gastrointestinal: Negative for abdominal pain, no bowel changes.  Musculoskeletal: Negative for gait problem or joint swelling.  Skin: Negative for rash.  Neurological: Negative for dizziness or headache.  No other specific complaints in a complete review of systems (except as listed in HPI above)  Objective  Vitals:   03/26/19 1351  BP: 126/80  Pulse: 65  Resp: 16  Temp: 97.8 F (36.6 C)  TempSrc: Temporal  SpO2: 94%  Weight: 194 lb 6.4 oz (88.2 kg)  Height: 5\' 8"  (1.727 m)   Body mass index is 29.56 kg/m.   Physical Exam  Constitutional: Patient appears well-developed and well-nourished. No distress.  HENT: Head: Normocephalic and atraumatic.  Eyes: Conjunctivae and EOM are normal. No scleral icterus.  Neck: Normal range of motion. Neck supple. No JVD present.  Cardiovascular: Normal rate, regular rhythm and normal heart sounds.  No murmur heard. No BLE edema. Pulmonary/Chest: Effort normal and breath sounds normal. No respiratory distress. Musculoskeletal: Normal range of motion, no joint effusions. No gross deformities Neurological: Pt is alert and oriented to person, place, and time. No cranial nerve deficit. Coordination, balance, strength, speech and gait are normal.  Skin: Skin is warm and dry. No rash  noted. No erythema.  Psychiatric: Patient has a normal mood and affect. behavior is normal. Judgment and thought content normal.  No results found for this or any previous visit (from the past 72 hour(s)).   PHQ2/9: Depression screen [facility] 2/9 03/26/2019 12/30/2018 11/23/2018 10/21/2018 09/09/2018  Decreased Interest 0 0 0 0 0  Down, Depressed, Hopeless 0 0 0 0 0  PHQ - 2 Score 0 0 0 0 0  Altered sleeping 0 0 0 0 -  Tired, decreased energy 0 0 0 0 -  Change in appetite 0 0 0 0 -  Feeling bad or failure about yourself  0 0 0 0 -  Trouble concentrating 0 0 0 0 -  Moving slowly or fidgety/restless 0 0 0 0 -  Suicidal thoughts 0 0 0 0 -  PHQ-9 Score 0 0 0 0 -  Difficult doing work/chores Not difficult at all Not difficult at all Not difficult at all Not difficult at all -  Some recent data might be hidden   PHQ-2/9 Result is negative.    Fall Risk: Fall Risk  03/26/2019 12/30/2018 11/23/2018 10/21/2018 09/09/2018  Falls in the past year? 0 0 0 0 0  Number falls in past yr: 0 0 0 0 0  Injury with Fall? 0 0 0 0 0  Follow up Falls evaluation completed - Falls evaluation completed Falls evaluation completed -   Assessment & Plan  1. Overweight (BMI 25.0-29.9) - Trial of this, recheck  weight in 4 months - Naltrexone-buPROPion HCl ER 8-90 MG TB12; Start 1 tablet every morning for 7 days, then 1 tablet twice daily for 7 days, then 2 tablets every morning and one in the evening for 7 days, then 2 tablets twice daily.  Dispense: 120 tablet; Refill: 3  2. Neutropenia, unspecified type (HCC) 3. Other iron deficiency anemia - Stable at last check; recheck labs in 4 months  4. Abnormal thyroid stimulating hormone (TSH) level - Stable at last check; recheck labs in 4 months  5. Breast lump on left side at 3 o'clock position - No additional follow up  6. Obstructive sleep apnea - Needs to follow up with Pulmonology  7. Seasonal allergic rhinitis due to pollen - Stable  8. Vitamin D deficiency 9. Vitamin B12 deficiency - Restart supplementation  10. Alopecia of scalp - Ambulatory referral to Dermatology   Face-to-face time with patient was more than 25 minutes, >50% time spent counseling and coordination of care

## 2019-03-26 NOTE — Patient Instructions (Addendum)
Call Dr. Meda Coffee for an appointment for follow up.

## 2019-04-24 ENCOUNTER — Emergency Department
Admission: EM | Admit: 2019-04-24 | Discharge: 2019-04-24 | Disposition: A | Discharge status: Home / Self Care | Payer: Managed Care, Other (non HMO) | Attending: Emergency Medicine | Admitting: Emergency Medicine

## 2019-04-24 ENCOUNTER — Other Ambulatory Visit: Payer: Self-pay

## 2019-04-24 ENCOUNTER — Encounter: Payer: Self-pay | Admitting: Emergency Medicine

## 2019-04-24 DIAGNOSIS — R002 Palpitations: Secondary | ICD-10-CM | POA: Diagnosis not present

## 2019-04-24 DIAGNOSIS — Z79899 Other long term (current) drug therapy: Secondary | ICD-10-CM | POA: Diagnosis not present

## 2019-04-24 DIAGNOSIS — I10 Essential (primary) hypertension: Secondary | ICD-10-CM | POA: Insufficient documentation

## 2019-04-24 LAB — BASIC METABOLIC PANEL
Anion gap: 10 (ref 5–15)
BUN: 12 mg/dL (ref 6–20)
CO2: 23 mmol/L (ref 22–32)
Calcium: 9 mg/dL (ref 8.9–10.3)
Chloride: 107 mmol/L (ref 98–111)
Creatinine, Ser: 0.84 mg/dL (ref 0.44–1.00)
GFR calc Af Amer: >60 mL/min (ref >60)
GFR calc non Af Amer: >60 mL/min (ref >60)
Glucose, Bld: 120 mg/dL — ABNORMAL HIGH (ref 70–99)
Potassium: 3.4 mmol/L — ABNORMAL LOW (ref 3.5–5.1)
Sodium: 140 mmol/L (ref 135–145)

## 2019-04-24 LAB — CBC WITH DIFFERENTIAL/PLATELET
Abs Immature Granulocytes: 0.01 10*3/uL (ref 0.00–0.07)
Basophils Absolute: 0 10*3/uL (ref 0.0–0.1)
Basophils Relative: 1 %
Eosinophils Absolute: 0.2 10*3/uL (ref 0.0–0.5)
Eosinophils Relative: 5 %
HCT: 39.5 % (ref 36.0–46.0)
Hemoglobin: 13.5 g/dL (ref 12.0–15.0)
Immature Granulocytes: 0 %
Lymphocytes Relative: 54 %
Lymphs Abs: 2.6 10*3/uL (ref 0.7–4.0)
MCH: 29.5 pg (ref 26.0–34.0)
MCHC: 34.2 g/dL (ref 30.0–36.0)
MCV: 86.2 fL (ref 80.0–100.0)
Monocytes Absolute: 0.5 10*3/uL (ref 0.1–1.0)
Monocytes Relative: 11 %
Neutro Abs: 1.4 10*3/uL — ABNORMAL LOW (ref 1.7–7.7)
Neutrophils Relative %: 29 %
Platelets: 354 10*3/uL (ref 150–400)
RBC: 4.58 MIL/uL (ref 3.87–5.11)
RDW: 12.5 % (ref 11.5–15.5)
WBC: 4.7 10*3/uL (ref 4.0–10.5)
nRBC: 0 % (ref 0.0–0.2)

## 2019-04-24 LAB — URINALYSIS, COMPLETE (UACMP) WITH MICROSCOPIC
Bilirubin Urine: NEGATIVE
Glucose, UA: NEGATIVE mg/dL
Hgb urine dipstick: NEGATIVE
Ketones, ur: NEGATIVE mg/dL
Nitrite: NEGATIVE
Protein, ur: NEGATIVE mg/dL
Specific Gravity, Urine: 1.016 (ref 1.005–1.030)
pH: 7 (ref 5.0–8.0)

## 2019-04-24 LAB — PREGNANCY, URINE: Preg Test, Ur: NEGATIVE

## 2019-04-24 LAB — TROPONIN I (HIGH SENSITIVITY): Troponin I (High Sensitivity): 3 ng/L (ref <18)

## 2019-04-24 LAB — TSH: TSH: 1.165 u[IU]/mL (ref 0.350–4.500)

## 2019-04-24 NOTE — ED Notes (Signed)
Per pt, started [facility] Nov 20th. Checks BP at home, noticed it was high today. She called a teledoc, who had her recheck her BP and had her come in.

## 2019-04-24 NOTE — ED Triage Notes (Signed)
Pt arrives POV and ambulatory to triage with c/o HTN today without hx. Pt denies CP at this time. Pt also states that she has felt her heart racing. Pt is otherwise in NAD.

## 2019-04-24 NOTE — ED Provider Notes (Signed)
Emergency Department Provider Note  ____________________________________________  Time seen: Approximately 11:31 PM  I have reviewed the triage vital signs and the nursing notes.   HISTORY  Chief Complaint Hypertension   Historian Patient     HPI Andrea Pope is a 48 y.o. female presents to the emergency department with concern for hypertension.  Patient states that she has never been diagnosed with essential hypertension.  She recently started taking naltrexone bupropion for weight loss and has recently increased her dose.  Patient states that she occasionally feels like her heart is racing.  She denies chest pain, chest tightness or current sensation of "heart racing".  She denies shortness of breath.  No fever at home.  No associated rhinorrhea, nasal congestion or nonproductive cough.  No history of cardiac issues in the past.   Past Medical History:  Diagnosis Date  . Abnormal thyroid stimulating hormone level   . Allergy   . Anemia    iron deficiency  . Encounter for initial prescription of injectable contraceptive   . Female stress incontinence   . High cholesterol   . History of ectopic pregnancy 11/29/2015  . Menorrhagia with irregular cycle   . Ovarian cyst 11/29/2015  . 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 Spider veins of both lower extremities   . Status post D&C/hysteroscopy/endometrial ablation 03/24/2017   Good TVH candidate 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 Pathology: Proliferative endometrium with breakdown changes; negative for atypia/hyperplasia/carcinoma     Immunizations up to date:  Yes.     Past Medical History:  Diagnosis Date  . Abnormal thyroid stimulating hormone level   . Allergy   . Anemia    iron deficiency  . Encounter for initial prescription of injectable contraceptive   . Female stress incontinence   . High cholesterol   . History of ectopic pregnancy 11/29/2015  . Menorrhagia with irregular cycle   . Ovarian  cyst 11/29/2015  . 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 Spider veins of both lower extremities   . 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 Mild episode of recurrent major depressive disorder ([place]) 03/26/2019  . Obstructive sleep apnea 12/30/2018  . Absolute anemia 01/09/2018  . Vitamin B12 deficiency 01/08/2018  . Vitamin D deficiency 01/08/2018  . Primary osteoarthritis of right knee 05/13/2017  . Overweight (BMI 25.0-29.9) 11/19/2016  . Neutropenia ([place]) 07/23/2016  . White coat hypertension 01/25/2016  . Dysmenorrhea 01/03/2016  . High cholesterol 01/01/2016  . Gallstones 12/05/2015  . Ovarian cyst 11/29/2015  . History of ectopic pregnancy 11/29/2015  . Proteinuria 08/14/2015  . Abnormal thyroid stimulating hormone (TSH) level 01/10/2015  . Allergic rhinitis, seasonal 01/10/2015  . Asymptomatic varicose veins of both lower extremities 01/10/2015    Past Surgical History:  Procedure Laterality Date  . CHOLECYSTECTOMY N/A 01/19/2016   Procedure: LAPAROSCOPIC CHOLECYSTECTOMY WITH INTRAOPERATIVE CHOLANGIOGRAM;  Surgeon: Christene Lye, MD;  Location: [facility] ORS;  Service: General;  Laterality: N/A;  . DILITATION & CURRETTAGE/HYSTROSCOPY WITH NOVASURE ABLATION N/A 03/24/2017   Procedure: DILATATION & CURETTAGE/HYSTEROSCOPY WITH NOVASURE ABLATION;  Surgeon: Brayton Mars, MD;  Location: [facility] ORS;  Service: Gynecology;  Laterality: N/A;  . laser surgery on eye Right 10/2016    Prior to Admission medications   Medication Sig Start Date End Date Taking? Authorizing Provider  Naltrexone-buPROPion HCl ER 8-90 MG TB12 Start 1 tablet every morning for 7 days, then 1 tablet twice daily for 7 days, then 2 tablets every morning and  one in the evening for 7 days, then 2 tablets twice daily. 03/26/19   Doren CustardBoyce, Emily E, FNP    Allergies Patient has no known allergies.  Family History  Problem Relation Age of Onset  . Hypertension Mother   . Thyroid disease Mother   . Kidney disease Mother   . Hypertension Father   . Cancer Father        Prostate  . Diabetes Brother   . Diabetes Other   . Cancer Paternal Uncle        Prostate  . Diabetes Maternal Grandmother   . Cancer Paternal Grandfather        Prostate  . Diabetes Sister   . Schizophrenia Sister   . Breast cancer Neg Hx   . Ovarian cancer Neg Hx   . Heart disease Neg Hx     Social History Social History   Tobacco Use  . Smoking status: Never Smoker  . Smokeless tobacco: Never Used  Substance Use Topics  . Alcohol use: No    Alcohol/week: 0.0 standard drinks  . Drug use: No     Review of Systems  Constitutional: No fever/chills Eyes:  No discharge ENT: No upper respiratory complaints. Respiratory: no cough. No SOB/ use of accessory muscles to breath Gastrointestinal:   No nausea, no vomiting.  No diarrhea.  No constipation. Musculoskeletal: Negative for musculoskeletal pain. Skin: Negative for rash, abrasions, lacerations, ecchymosis.   ____________________________________________   PHYSICAL EXAM:  VITAL SIGNS: ED Triage Vitals  Enc Vitals Group     BP 04/24/19 1958 (!) 182/111     Pulse Rate 04/24/19 1958 98     Resp 04/24/19 1958 18     Temp 04/24/19 1958 98.8 F (37.1 C)     Temp Source 04/24/19 1958 Oral     SpO2 04/24/19 1958 97 %     Weight 04/24/19 1959 185 lb (83.9 kg)     Height 04/24/19 1959 5\' 8"  (1.727 m)     Head Circumference --      Peak Flow --      Pain Score 04/24/19 1959 0     Pain Loc --      Pain Edu? --      Excl. in GC? --      Constitutional: Alert and oriented. Well appearing and in no acute distress. Eyes: Conjunctivae are normal. PERRL. EOMI. Head: Atraumatic. ENT:      Nose: No  congestion/rhinnorhea.      Mouth/Throat: Mucous membranes are moist.  Neck: No stridor.  No cervical spine tenderness to palpation. Cardiovascular: Normal rate, regular rhythm. Normal S1 and S2.  Good peripheral circulation. Respiratory: Normal respiratory effort without tachypnea or retractions. Lungs CTAB. Good air entry to the bases with no decreased or absent breath sounds Gastrointestinal: Bowel sounds x 4 quadrants. Soft and nontender to palpation. No guarding or rigidity. No distention. Musculoskeletal: Full range of motion to all extremities. No obvious deformities noted Neurologic:  Normal for age. No gross focal neurologic deficits are appreciated.  Skin:  Skin is warm, dry and intact. No rash noted. Psychiatric: Mood and affect are normal for age. Speech and behavior are normal.   ____________________________________________   LABS (all labs ordered are listed, but only abnormal results are displayed)  Labs Reviewed  CBC WITH DIFFERENTIAL/PLATELET - Abnormal; Notable  for the following components:      Result Value   Neutro Abs 1.4 (*)    All other components within normal limits  BASIC METABOLIC PANEL - Abnormal; Notable for the following components:   Potassium 3.4 (*)    Glucose, Bld 120 (*)    All other components within normal limits  URINALYSIS, COMPLETE (UACMP) WITH MICROSCOPIC - Abnormal; Notable for the following components:   Color, Urine YELLOW (*)    APPearance CLOUDY (*)    Leukocytes,Ua TRACE (*)    Bacteria, UA RARE (*)    All other components within normal limits  TSH  PREGNANCY, URINE  TROPONIN I (HIGH SENSITIVITY)   ____________________________________________  EKG   ____________________________________________  RADIOLOGY  No results found.  ____________________________________________    PROCEDURES  Procedure(s) performed:     Procedures     Medications - No data to  display   ____________________________________________   INITIAL IMPRESSION / ASSESSMENT AND PLAN / ED COURSE  Pertinent labs & imaging results that were available during my care of the patient were reviewed by me and considered in my medical decision making (see chart for details).      Assessment and Plan:  Hypertension 48 year old female presents to the emergency department with concern for hypertension after starting a new medication for weight loss.  Patient was hypertensive at triage.  Basic labs were obtained in the emergency department which were reassuring.  Troponin was within reference range.  EKG revealed normal sinus rhythm without ischemic changes.  TSH was within reference range.  Discussed starting patient on an antihypertensive agent but patient stated that she wanted to stop taking naltrexone bupropion first before starting medication.  Patient was advised to update her primary care provider regarding new onset hypertension.  Strict return precautions were given to return to the emergency department for new or worsening symptoms.  All patient questions were answered.  ____________________________________________  FINAL CLINICAL IMPRESSION(S) / ED DIAGNOSES  Final diagnoses:  Hypertension, unspecified type      NEW MEDICATIONS STARTED DURING THIS VISIT:  ED Discharge Orders    None          This chart was dictated using voice recognition software/Dragon. Despite best efforts to proofread, errors can occur which can change the meaning. Any change was purely unintentional.     Orvil Feil, PA-C 04/25/19 [id number]    Sharman Cheek, MD 04/25/19 2326

## 2019-04-24 NOTE — ED Notes (Signed)
Per MD Joni Fears, no repeat troponin needed at this time.

## 2019-04-24 NOTE — Discharge Instructions (Signed)
Stop taking naltrexone bupropion due to new onset hypertension and sensation of heart racing. If hypertension persists, please follow-up with primary care to start antihypertensive.

## 2019-04-24 NOTE — ED Notes (Signed)
Lab to add on urine preg.  

## 2019-05-06 ENCOUNTER — Encounter: Payer: Self-pay | Admitting: Family Medicine

## 2019-05-06 ENCOUNTER — Ambulatory Visit (INDEPENDENT_AMBULATORY_CARE_PROVIDER_SITE_OTHER): Payer: Managed Care, Other (non HMO) | Admitting: Family Medicine

## 2019-05-06 ENCOUNTER — Other Ambulatory Visit: Payer: Self-pay

## 2019-05-06 VITALS — BP 140/75

## 2019-05-06 DIAGNOSIS — E876 Hypokalemia: Secondary | ICD-10-CM

## 2019-05-06 DIAGNOSIS — E663 Overweight: Secondary | ICD-10-CM | POA: Diagnosis not present

## 2019-05-06 NOTE — Progress Notes (Signed)
Name: Andrea Pope   MRN: [id number]    DOB: [birth year]   Date:05/06/2019       Progress Note  Subjective  Chief Complaint  Chief Complaint  Patient presents with  . ER Follow up  . Hypertension    elevated BP 189/112    I connected with  Andrea Pope on 05/06/19 at  7:20 AM EST by telephone and verified that I am speaking with the correct person using two identifiers.  I discussed the limitations, risks, security and privacy concerns of performing an evaluation and management service by telephone and the availability of in person appointments. Staff also discussed with the patient that there may be a patient responsible charge related to this service. Patient Location: Home Provider Location: Office Additional Individuals present: None  HPI  Pt presents for ER follow up. She started Contrave after most recent visit with me on 03/26/2019, and noticed that her heart rate had been elevated with some palpitations.  She then checked her BP on 04/24/2019 and it was very elevated so she called on-call doctor who advised she go to ER for management.  Labs reviewed - mild hypokalemia, CBC and TSH with no actionable derangements. She has since stopped the Contrave and is feeling better - BP has been coming down and HR has been normal.  Today: 140/70, HR 63bpm today.    Denies chest pain, shortness of breath; palpitations have subsided and are no longer causing any issues.  She has been doing a lot of walking and working on her diet - we will refer to nutrition today.  She is encouraged to continue her weight management journey without medication at this time and she is agreeable.  Patient Active Problem List   Diagnosis Date Noted  . 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 Obstructive sleep apnea 12/30/2018  . Absolute anemia 01/09/2018  . Vitamin B12 deficiency 01/08/2018  . Vitamin D deficiency 01/08/2018  . Primary osteoarthritis of right knee  05/13/2017  . Overweight (BMI 25.0-29.9) 11/19/2016  . Neutropenia (HCC) 07/23/2016  . White coat hypertension 01/25/2016  . Dysmenorrhea 01/03/2016  . High cholesterol 01/01/2016  . Gallstones 12/05/2015  . Ovarian cyst 11/29/2015  . History of ectopic pregnancy 11/29/2015  . Proteinuria 08/14/2015  . Abnormal thyroid stimulating hormone (TSH) level 01/10/2015  . Allergic rhinitis, seasonal 01/10/2015  . Asymptomatic varicose veins of both lower extremities 01/10/2015    Past Surgical History:  Procedure Laterality Date  . CHOLECYSTECTOMY N/A 01/19/2016   Procedure: LAPAROSCOPIC CHOLECYSTECTOMY WITH INTRAOPERATIVE CHOLANGIOGRAM;  Surgeon: Kieth BrightlySeeplaputhur G Sankar, MD;  Location: [facility] ORS;  Service: General;  Laterality: N/A;  . DILITATION & CURRETTAGE/HYSTROSCOPY WITH NOVASURE ABLATION N/A 03/24/2017   Procedure: DILATATION & CURETTAGE/HYSTEROSCOPY WITH NOVASURE ABLATION;  Surgeon: Herold Harmsefrancesco, Martin A, MD;  Location: [facility] ORS;  Service: Gynecology;  Laterality: N/A;  . laser surgery on eye Right 10/2016    Family History  Problem Relation Age of Onset  . Hypertension Mother   . Thyroid disease Mother   . Kidney disease Mother   . Hypertension Father   . Cancer Father        Prostate  . Diabetes Brother   . Diabetes Other   . Cancer Paternal Uncle        Prostate  . Diabetes Maternal Grandmother   . Cancer Paternal Grandfather        Prostate  . Diabetes Sister   . Schizophrenia Sister   . Breast  cancer Neg Hx   . Ovarian cancer Neg Hx   . Heart disease Neg Hx     Social History   Socioeconomic History  . Marital status: Married    Spouse name: Ron   . Number of children: 4  . Years of education: Not on file  . Highest education level: Bachelor's degree (e.g., BA, AB, BS)  Occupational History  . Occupation: Pharmacist, hospital  Tobacco Use  . Smoking status: Never Smoker  . Smokeless tobacco: Never Used  Substance and Sexual Activity  . Alcohol use: No    Alcohol/week:  0.0 standard drinks  . Drug use: No  . Sexual activity: Yes    Partners: Male    Birth control/protection: None  Other Topics Concern  . Not on file  Social History Narrative  . Not on file   Social Determinants of Health   Financial Resource Strain: Low Risk   . Difficulty of Paying Living Expenses: Not hard at all  Food Insecurity: No Food Insecurity  . Worried About Charity fundraiser in the Last Year: Never true  . Ran Out of Food in the Last Year: Never true  Transportation Needs: No Transportation Needs  . Lack of Transportation (Medical): No  . Lack of Transportation (Non-Medical): No  Physical Activity: Sufficiently Active  . Days of Exercise per Week: 5 days  . Minutes of Exercise per Session: 60 min  Stress: No Stress Concern Present  . Feeling of Stress : Not at all  Social Connections: Not Isolated  . Frequency of Communication with Friends and Family: More than three times a week  . Frequency of Social Gatherings with Friends and Family: More than three times a week  . Attends Religious Services: 1 to 4 times per year  . Active Member of Clubs or Organizations: Yes  . Attends Archivist Meetings: 1 to 4 times per year  . Marital Status: Married  Human resources officer Violence: Not At Risk  . Fear of Current or Ex-Partner: No  . Emotionally Abused: No  . Physically Abused: No  . Sexually Abused: No     Current Outpatient Medications:  .  Naltrexone-buPROPion HCl ER 8-90 MG TB12, Start 1 tablet every morning for 7 days, then 1 tablet twice daily for 7 days, then 2 tablets every morning and one in the evening for 7 days, then 2 tablets twice daily. (Patient not taking: Reported on 05/06/2019), Disp: 120 tablet, Rfl: 3  No Known Allergies  I personally reviewed active problem list, medication list, allergies, notes from last encounter, lab results with the patient/caregiver today.   ROS Ten systems reviewed and is negative except as mentioned in HPI   Objective  Virtual encounter, vitals not obtained.  There is no height or weight on file to calculate BMI.  Physical Exam  Pulmonary/Chest: Effort normal. No respiratory distress. Speaking in complete sentences Neurological: Pt is alert and oriented to person, place, and time. Speech is normal Psychiatric: Patient has a normal mood and affect. behavior is normal. Judgment and thought content normal.   No results found for this or any previous visit (from the past 72 hour(s)).  PHQ2/9: Depression screen [facility] 2/9 05/06/2019 03/26/2019 12/30/2018 11/23/2018 10/21/2018  Decreased Interest 0 0 0 0 0  Down, Depressed, Hopeless 0 0 0 0 0  PHQ - 2 Score 0 0 0 0 0  Altered sleeping 0 0 0 0 0  Tired, decreased energy 0 0 0 0 0  Change in  appetite 0 0 0 0 0  Feeling bad or failure about yourself  0 0 0 0 0  Trouble concentrating 0 0 0 0 0  Moving slowly or fidgety/restless 0 0 0 0 0  Suicidal thoughts 0 0 0 0 0  PHQ-9 Score 0 0 0 0 0  Difficult doing work/chores Not difficult at all Not difficult at all Not difficult at all Not difficult at all Not difficult at all  Some recent data might be hidden   PHQ-2/9 Result is negative.    Fall Risk: Fall Risk  05/06/2019 03/26/2019 12/30/2018 11/23/2018 10/21/2018  Falls in the past year? 0 0 0 0 0  Number falls in past yr: 0 0 0 0 0  Injury with Fall? 0 0 0 0 0  Follow up Falls evaluation completed Falls evaluation completed - Falls evaluation completed Falls evaluation completed    Assessment & Plan  1. Overweight (BMI 25.0-29.9) - Contrave added to allergy list, advised that at this time her BP is returning to normal and we will avoid antihypertensives.  She will come in to office in 1 week for BP check and repeat BMP to ensure stability. - Amb ref to Medical Nutrition Therapy-MNT  I discussed the assessment and treatment plan with the patient. The patient was provided an opportunity to ask questions and all were answered. The patient agreed  with the plan and demonstrated an understanding of the instructions.   The patient was advised to call back or seek an in-person evaluation if the symptoms worsen or if the condition fails to improve as anticipated.  I provided 11 minutes of non-face-to-face time during this encounter.  Doren Custard, FNP

## 2019-05-12 DIAGNOSIS — L668 Other cicatricial alopecia: Secondary | ICD-10-CM | POA: Diagnosis not present

## 2019-05-13 ENCOUNTER — Other Ambulatory Visit: Payer: Self-pay

## 2019-05-13 ENCOUNTER — Ambulatory Visit: Payer: BC Managed Care – PPO

## 2019-05-13 ENCOUNTER — Other Ambulatory Visit: Payer: Self-pay | Admitting: Emergency Medicine

## 2019-05-13 DIAGNOSIS — E559 Vitamin D deficiency, unspecified: Secondary | ICD-10-CM

## 2019-05-13 DIAGNOSIS — D508 Other iron deficiency anemias: Secondary | ICD-10-CM | POA: Diagnosis not present

## 2019-05-13 DIAGNOSIS — E876 Hypokalemia: Secondary | ICD-10-CM | POA: Diagnosis not present

## 2019-05-13 NOTE — Progress Notes (Unsigned)
Vitamin b12

## 2019-05-14 LAB — BASIC METABOLIC PANEL
BUN: 16 mg/dL (ref 7–25)
CO2: 27 mmol/L (ref 20–32)
Calcium: 9.4 mg/dL (ref 8.6–10.2)
Chloride: 103 mmol/L (ref 98–110)
Creat: 0.9 mg/dL (ref 0.50–1.10)
Glucose, Bld: 85 mg/dL (ref 65–99)
Potassium: 4.4 mmol/L (ref 3.5–5.3)
Sodium: 137 mmol/L (ref 135–146)

## 2019-05-14 LAB — VITAMIN D 25 HYDROXY (VIT D DEFICIENCY, FRACTURES): Vit D, 25-Hydroxy: 17 ng/mL — ABNORMAL LOW (ref 30–100)

## 2019-05-14 LAB — VITAMIN B12: Vitamin B-12: 437 pg/mL (ref 200–1100)

## 2019-05-26 ENCOUNTER — Other Ambulatory Visit: Payer: Self-pay

## 2019-05-26 ENCOUNTER — Encounter: Payer: BC Managed Care – PPO | Attending: Family Medicine | Admitting: Dietician

## 2019-05-26 VITALS — Ht 68.0 in | Wt 190.2 lb

## 2019-05-26 DIAGNOSIS — E663 Overweight: Secondary | ICD-10-CM

## 2019-05-26 NOTE — Progress Notes (Signed)
Medical Nutrition Therapy: Visit start time: 1100  end time: 1200  Assessment:  Diagnosis: overweight Past medical history: mild sleep apnea Psychosocial issues/ stress concerns: none  Preferred learning method:  . Auditory . Visual . Hands-on   Current weight: 190.2lbs with shoes  Height: 5'8" Medications, supplements: Vitamin D, probiotic, apple cider vinegar supplement   Progress and evaluation:   Patient attended MNT visit on 06/23/19 for weight management. She was following a vegetarian eating pattern at the time, but is no longer doing so.   She reports trying Contrave to aid weight loss, which was helping, but had to stop due to increase in blood pressure.   She had lost down to about 185lbs when taking Contrave, but weight has increased again since stopping. She liked the effect of feeling full sooner with the medication.    She would like help with resuming weight loss; having difficulty despite following low-calorie eating pattern. Her goal is 165lbs. She reports increase in abdominal weight, and wants to reverse that pattern; and wants to reduce risk for chronic illness per family history.  She reports not feeling hungry in am x many years, so has been skipping breakfast recently.  Physical activity: walking, light cardio for 30 minutes, 4x a week  Dietary Intake:  Usual eating pattern includes 2 meals and 2 snacks per day. Dining out frequency: 1-2 meals per week.  Breakfast: usually coffee only Snack: none Lunch: [place] sandwich with lettuce or salad; chicken wings (air fryer) + veg + fruit; salad; leftovers (pomegranate) Snack: cheez-it crackers, popcorn, chips Supper: light meal -- veg + protein, low carb meal Snack: none occ 1/2 bag popcorn Beverages: water, occ juice diluted with water  Nutrition Care Education: Topics covered:  Basic nutrition: basic food groups, appropriate nutrient balance, appropriate meal and snack schedule, general nutrition guidelines     Weight control: appropriate food portions, estimated energy needs for weight loss at 1400-1500kcal, provided guidance for 40% CHO, 30% protein, 30% fat -- discussed lower-carb intake as an option for potential results with abdominal weight, especially if reducing processed starches and sugars; discussed healthy carb choices; discussed role of and options for changes in physical activity; discussed healthy eating patterns and advantages/ disadvantages of intermittent fasting pattern ie restricting eating to shorter time-frame during the day.    Nutritional Diagnosis:  [place]-3.3 Overweight/obesity As related to history of excess calories and inadequate physical activity.  As evidenced by patient with current BMI of 28.92, following low-calorie eating pattern to promote weight loss.  Intervention:   Discussion and instruction as noted above.  Patient seems to be making healthy food choices in general, and controlling food portions.   She will plan to adjust macronutrient balance to help promote resumption of weight loss.   Established goals with direction from patient.  No follow-up scheduled at this time; patient will schedule later if needed.  Education Materials given:  . Plate Planner with food lists, sample meal pattern . sample menus . Goals/ instructions   Learner/ who was taught:  . Patient   Level of understanding: Marland Kitchen Verbalizes/ demonstrates competency   Demonstrated degree of understanding via:   Teach back Learning barriers: . None  Willingness to learn/ readiness for change: . Eager, change in progress  Monitoring and Evaluation:  Dietary intake, exercise, and body weight      follow up: prn

## 2019-05-26 NOTE — Patient Instructions (Signed)
   Great job making healthy food choices, keep up the good work!  Consider shifting caloric balance to increase protein amount and keep carb intake low -- choose mostly whole grains and fruits as carbs. This might help further reduce belly fat.   Keep up regular exercise, consider adding strength-building exercise or higher intensity exercise to further boost calorie burning.

## 2019-06-23 DIAGNOSIS — E559 Vitamin D deficiency, unspecified: Secondary | ICD-10-CM | POA: Diagnosis not present

## 2019-06-23 DIAGNOSIS — L668 Other cicatricial alopecia: Secondary | ICD-10-CM | POA: Diagnosis not present

## 2019-07-05 ENCOUNTER — Ambulatory Visit: Payer: BC Managed Care – PPO | Attending: Internal Medicine

## 2019-07-05 DIAGNOSIS — Z20828 Contact with and (suspected) exposure to other viral communicable diseases: Secondary | ICD-10-CM | POA: Insufficient documentation

## 2019-07-05 DIAGNOSIS — U071 COVID-19: Secondary | ICD-10-CM

## 2019-07-06 ENCOUNTER — Other Ambulatory Visit: Payer: Self-pay

## 2019-07-06 ENCOUNTER — Ambulatory Visit: Payer: Self-pay | Admitting: *Deleted

## 2019-07-06 ENCOUNTER — Ambulatory Visit (INDEPENDENT_AMBULATORY_CARE_PROVIDER_SITE_OTHER): Payer: BC Managed Care – PPO | Admitting: Internal Medicine

## 2019-07-06 ENCOUNTER — Encounter: Payer: Self-pay | Admitting: Internal Medicine

## 2019-07-06 VITALS — Ht 68.0 in | Wt 188.0 lb

## 2019-07-06 DIAGNOSIS — J4520 Mild intermittent asthma, uncomplicated: Secondary | ICD-10-CM

## 2019-07-06 DIAGNOSIS — R0602 Shortness of breath: Secondary | ICD-10-CM

## 2019-07-06 LAB — NOVEL CORONAVIRUS, NAA: SARS-CoV-2, NAA: NOT DETECTED

## 2019-07-06 MED ORDER — ALBUTEROL SULFATE HFA 108 (90 BASE) MCG/ACT IN AERS: 1.0000 | INHALATION_SPRAY | RESPIRATORY_TRACT | 1 refills | Status: DC | PRN

## 2019-07-06 NOTE — Telephone Encounter (Signed)
Patient had virtual with Dr.Hendrickson

## 2019-07-06 NOTE — Progress Notes (Signed)
Name: Andrea Pope   MRN: [id number]    DOB: March 10, 1971   Date:07/06/2019       Progress Note  Subjective  Chief Complaint  Chief Complaint  Patient presents with  . Shortness of Breath    Onset 1 week ago, comes and goes    I connected with  Kjersti K Osterhout  on 07/06/19 at  8:20 AM EST by a video enabled telemedicine application and verified that I am speaking with the correct person using two identifiers.  I discussed the limitations of evaluation and management by telemedicine and the availability of in person appointments. The patient expressed understanding and agreed to proceed. Staff also discussed with the patient that there may be a patient responsible charge related to this service. Patient Location: Home Provider Location: [facility] Additional Individuals present: none  HPI Patient is a 49 year old female who has noted some intermittent shortness of breath over the past week. She was tested for Covid yesterday afternoon, in [place].  She notes she has been sleeping all night, works from home during the week at out of home on weekends, and that her symptoms are more problematic when she is home.  She works Saturday night, and had no problems with shortness of breath episodes.  The episodes last at most about 30 minutes, and seemed to improve.  She tried to take some gas pills as thought that may be a contributor.  She also was told she had panic attacks in the past when she had something similar to this, although she denies any increased anxiety or stressors, no associated palpitations or cardiac symptoms of concern.  She has been on an albuterol inhaler in the past when she had bronchitis.  Denies any history of asthma.  Notes her energy levels have been good, and does not feel particularly ill.  She did have an episode of pneumonia in her past. neg cough, no production no fever, feeling feverish neg sore throat.  negmild congestion no loss of smell, loss of taste no  N/V no muscle aches No marked loose stools/diarrhea No CP, confusion, passing out episodes No orthopnea, better when lay flat, No PND No difference in outside vs inside, more prominent at home No asthma/COPD hx,  No h/o DM, heart disease, morbid obesity  Never smoker.   Patient Active Problem List   Diagnosis Date Noted  . 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 Obstructive sleep apnea 12/30/2018  . Absolute anemia 01/09/2018  . Vitamin B12 deficiency 01/08/2018  . Vitamin D deficiency 01/08/2018  . Primary osteoarthritis of right knee 05/13/2017  . Overweight (BMI 25.0-29.9) 11/19/2016  . Neutropenia (HCC) 07/23/2016  . White coat hypertension 01/25/2016  . Dysmenorrhea 01/03/2016  . High cholesterol 01/01/2016  . Gallstones 12/05/2015  . Ovarian cyst 11/29/2015  . History of ectopic pregnancy 11/29/2015  . Proteinuria 08/14/2015  . Abnormal thyroid stimulating hormone (TSH) level 01/10/2015  . Allergic rhinitis, seasonal 01/10/2015  . Asymptomatic varicose veins of both lower extremities 01/10/2015    Past Surgical History:  Procedure Laterality Date  . CHOLECYSTECTOMY N/A 01/19/2016   Procedure: LAPAROSCOPIC CHOLECYSTECTOMY WITH INTRAOPERATIVE CHOLANGIOGRAM;  Surgeon: Kieth Brightly, MD;  Location: [facility] ORS;  Service: General;  Laterality: N/A;  . DILITATION & CURRETTAGE/HYSTROSCOPY WITH NOVASURE ABLATION N/A 03/24/2017   Procedure: DILATATION & CURETTAGE/HYSTEROSCOPY WITH NOVASURE ABLATION;  Surgeon: Herold Harms, MD;  Location: [facility] ORS;  Service: Gynecology;  Laterality: N/A;  . laser surgery on eye Right  10/2016    Family History  Problem Relation Age of Onset  . Hypertension Mother   . Thyroid disease Mother   . Kidney disease Mother   . Hypertension Father   . Cancer Father        Prostate  . Diabetes Brother   . Diabetes Other   . Cancer Paternal Uncle        Prostate  . Diabetes Maternal Grandmother   .  Cancer Paternal Grandfather        Prostate  . Diabetes Sister   . Schizophrenia Sister   . Breast cancer Neg Hx   . Ovarian cancer Neg Hx   . Heart disease Neg Hx     Social History   Tobacco Use  . Smoking status: Never Smoker  . Smokeless tobacco: Never Used  Substance Use Topics  . Alcohol use: No    Alcohol/week: 0.0 standard drinks     Current Outpatient Medications:  .  HALOG 0.1 % external solution, , Disp: , Rfl:  .  VITAMIN D PO, Take by mouth., Disp: , Rfl:  .  Probiotic Product (ADVANCED PROBIOTIC PO), Take by mouth., Disp: , Rfl:  .  UNABLE TO FIND, Med Name: [place] apple cider vinegar gummies, Disp: , Rfl:   Allergies  Allergen Reactions  . Contrave [Naltrexone-Bupropion Hcl Er]     Palpitations, hypertension    With staff assistance, above reviewed with the patient today.   ROS: As per HPI, otherwise no specific complaints on a limited and focused system review   Objective  Virtual encounter, vitals not obtained.  Body mass index is 28.59 kg/m.  Physical Exam  Patient appears in NAD, not appear ill, calm demeanor HENT: Head: Normocephalic and atraumatic. Conj-non-inj'ed Pulmonary/Chest: Effort normal. No respiratory distress. Speaking in complete sentences Neurological: Pt is alert and oriented,  Speech is normal.  Psychiatric: Patient has a normal mood and affect, behavior is normal. Very appropriate with conversation, judgment and thought content normal.   Covid test - pending  PHQ2/9: Depression screen [facility] 2/9 07/06/2019 05/26/2019 05/06/2019 03/26/2019 12/30/2018  Decreased Interest 0 0 0 0 0  Down, Depressed, Hopeless 0 0 0 0 0  PHQ - 2 Score 0 0 0 0 0  Altered sleeping 0 - 0 0 0  Tired, decreased energy 0 - 0 0 0  Change in appetite 0 - 0 0 0  Feeling bad or failure about yourself  0 - 0 0 0  Trouble concentrating 0 - 0 0 0  Moving slowly or fidgety/restless 0 - 0 0 0  Suicidal thoughts 0 - 0 0 0  PHQ-9 Score 0 - 0 0 0  Difficult doing  work/chores Not difficult at all - Not difficult at all Not difficult at all Not difficult at all  Some recent data might be hidden   PHQ-2/9 Result reviewed - neg  Fall Risk: Fall Risk  07/06/2019 05/26/2019 05/06/2019 03/26/2019 12/30/2018  Falls in the past year? 0 0 0 0 0  Number falls in past yr: 0 - 0 0 0  Injury with Fall? 0 - 0 0 0  Follow up - - Falls evaluation completed Falls evaluation completed -     Assessment & Plan  1. Shortness of breath Exact source unclear, seems unlikely to be a major infectious source as we discussed.  Do feel getting the Covid test was appropriate and await that result.  Possible reactive airway component discussed, and can be environmental or allergic based.  No major allergic asthma history.  She seemed quite reserved and calm on our video assessment, and seems less likely to be a panic attack component.  Did note can internalize stressors and sometimes can be an issue as was noted to her in the past as well..  Limited not being able to listen to her today, as this is a video call.  Also noted spirometry is not available in the times of Covid and has been more challenging. Agreed to try an albuterol inhaler empirically, 1 to 2 puffs up to 4 times a day every 4 hours as needed, and she has used before and does not have to use the inhaler. Not feel adding any expectorant or decongestant as needed presently. Reviewed if the Covid returns positive the importance of symptomatic measures and isolation. If that returns negative, and her symptoms are not improving or more problematic, asked if she could present to the office to be assessed, as do feel a clinical visit will be needed to be more helpful.  She was in agreement with that.  Await her response - albuterol (VENTOLIN HFA) 108 (90 Base) MCG/ACT inhaler; Inhale 1-2 puffs into the lungs every 4 (four) hours as needed for wheezing or shortness of breath.  Dispense: 6.7 g; Refill: 1  2. Mild intermittent  reactive airway disease without complication As above - albuterol (VENTOLIN HFA) 108 (90 Base) MCG/ACT inhaler; Inhale 1-2 puffs into the lungs every 4 (four) hours as needed for wheezing or shortness of breath.  Dispense: 6.7 g; Refill: 1   I discussed the assessment and treatment plan with the patient. The patient was provided an opportunity to ask questions and all were answered. The patient agreed with the plan and demonstrated an understanding of the instructions.  Red flags and when to present for emergency care or RTC including fever >101.27F, chest pain, shortness of breath, new/worsening/un-resolving symptoms reviewed with patient at time of visit.  The patient was advised to call back or seek an in-person evaluation if the symptoms worsen or if the condition fails to improve as anticipated.  I provided 20 minutes of non-face-to-face time during this encounter that included discussing at length patient's sx/history, pertinent pmhx, medications, treatment and follow up plan. This time also included the necessary documentation, orders, and chart review.

## 2019-07-06 NOTE — Telephone Encounter (Signed)
Pt called in c/o shortness of breath on and off for the past week.  She had a COVID-19 test done yesterday just to be sure but is not having any other symptoms.   Does not have her test result yet.   She does have a history of panic attacks years ago and is wondering if that is what's going on.   She is not in distress.  Per protocol I made her an appt.  I made her a virtual visit appt with Maurice Small, FNP for today at 8:20 after doing the screening questions.  I sent my notes to the office.  Reason for Disposition . [1] MODERATE longstanding difficulty breathing (e.g., speaks in phrases, SOB even at rest, pulse 100-120) AND [2] SAME as normal  Answer Assessment - Initial Assessment Questions 1. RESPIRATORY STATUS: "Describe your breathing?" (e.g., wheezing, shortness of breath, unable to speak, severe coughing)      Past few days on and off I'm short of breath.   I felt short of breath a lot yesterday.   I had a COVID-19 test done yesterday.    Years ago I had panic attacks that make me short of breath too.   I've had pneumonia before 14 years ago. 2. ONSET: "When did this breathing problem begin?"      A week. 3. PATTERN "Does the difficult breathing come and go, or has it been constant since it started?"      Comes and goes.   I'm not having problems sleeping because of it. 4. SEVERITY: "How bad is your breathing?" (e.g., mild, moderate, severe)    - MILD: No SOB at rest, mild SOB with walking, speaks normally in sentences, can lay down, no retractions, pulse < 100.    - MODERATE: SOB at rest, SOB with minimal exertion and prefers to sit, cannot lie down flat, speaks in phrases, mild retractions, audible wheezing, pulse 100-120.    - SEVERE: Very SOB at rest, speaks in single words, struggling to breathe, sitting hunched forward, retractions, pulse > 120      Mild.    5. RECURRENT SYMPTOM: "Have you had difficulty breathing before?" If so, ask: "When was the last time?" and "What happened  that time?"      Yes with panic attacks. 6. CARDIAC HISTORY: "Do you have any history of heart disease?" (e.g., heart attack, angina, bypass surgery, angioplasty)      No 7. LUNG HISTORY: "Do you have any history of lung disease?"  (e.g., pulmonary embolus, asthma, emphysema)     No 8. CAUSE: "What do you think is causing the breathing problem?"      Maybe panic attacks 9. OTHER SYMPTOMS: "Do you have any other symptoms? (e.g., dizziness, runny nose, cough, chest pain, fever)     No 10. PREGNANCY: "Is there any chance you are pregnant?" "When was your last menstrual period?"       No 11. TRAVEL: "Have you traveled out of the country in the last month?" (e.g., travel history, exposures)       No COVID-19 exposures.  Protocols used: BREATHING DIFFICULTY-A-AH

## 2019-08-03 DIAGNOSIS — E559 Vitamin D deficiency, unspecified: Secondary | ICD-10-CM | POA: Diagnosis not present

## 2019-08-03 DIAGNOSIS — L668 Other cicatricial alopecia: Secondary | ICD-10-CM | POA: Diagnosis not present

## 2019-09-06 DIAGNOSIS — L668 Other cicatricial alopecia: Secondary | ICD-10-CM | POA: Diagnosis not present

## 2019-09-07 ENCOUNTER — Other Ambulatory Visit: Payer: Self-pay

## 2019-09-07 ENCOUNTER — Ambulatory Visit: Payer: BC Managed Care – PPO | Admitting: Family Medicine

## 2019-09-07 ENCOUNTER — Encounter: Payer: Self-pay | Admitting: Family Medicine

## 2019-09-07 ENCOUNTER — Ambulatory Visit: Payer: Self-pay | Admitting: *Deleted

## 2019-09-07 VITALS — BP 136/88 | HR 78 | Temp 97.9°F | Resp 14 | Ht 68.0 in | Wt 191.9 lb

## 2019-09-07 DIAGNOSIS — R739 Hyperglycemia, unspecified: Secondary | ICD-10-CM | POA: Diagnosis not present

## 2019-09-07 DIAGNOSIS — E538 Deficiency of other specified B group vitamins: Secondary | ICD-10-CM

## 2019-09-07 DIAGNOSIS — E559 Vitamin D deficiency, unspecified: Secondary | ICD-10-CM | POA: Diagnosis not present

## 2019-09-07 DIAGNOSIS — D709 Neutropenia, unspecified: Secondary | ICD-10-CM

## 2019-09-07 DIAGNOSIS — R450 Nervousness: Secondary | ICD-10-CM | POA: Diagnosis not present

## 2019-09-07 DIAGNOSIS — R350 Frequency of micturition: Secondary | ICD-10-CM | POA: Diagnosis not present

## 2019-09-07 DIAGNOSIS — R5383 Other fatigue: Secondary | ICD-10-CM

## 2019-09-07 DIAGNOSIS — Z8349 Family history of other endocrine, nutritional and metabolic diseases: Secondary | ICD-10-CM

## 2019-09-07 DIAGNOSIS — Z833 Family history of diabetes mellitus: Secondary | ICD-10-CM

## 2019-09-07 DIAGNOSIS — R351 Nocturia: Secondary | ICD-10-CM | POA: Diagnosis not present

## 2019-09-07 DIAGNOSIS — R61 Generalized hyperhidrosis: Secondary | ICD-10-CM | POA: Diagnosis not present

## 2019-09-07 DIAGNOSIS — E876 Hypokalemia: Secondary | ICD-10-CM

## 2019-09-07 DIAGNOSIS — D508 Other iron deficiency anemias: Secondary | ICD-10-CM

## 2019-09-07 LAB — POCT GLYCOSYLATED HEMOGLOBIN (HGB A1C): Hemoglobin A1C: 5.2 % (ref 4.0–5.6)

## 2019-09-07 NOTE — Progress Notes (Signed)
Patient ID: Andrea Pope, female    DOB: December 19, 1970, 49 y.o.   MRN: [id number]  PCP: Doren Custard, FNP  Chief Complaint  Patient presents with  . Shaking     jittery, sweating not feeling well yesterday aslo frequent urination.  Husband is diabetic and took her sugar and it was 156.  Ate dinner at 5:00 and crackers about 1 hr before checking at 9:30.  This am was 116    Subjective:   Andrea Pope is a 49 y.o. 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  But when she got home she didn't feel well and she went to bed, she had sweats, felt a little hot, did not have a fever, no hot or cold chills, she had worsening sensation of weakness jitters and shakiness.  Her husband got home and found her in bed and was concerned that it could be low blood sugar so they checked it with his glucometer.  Her blood sugar yesterday evening was 156 which they thought was a little high she had eaten dinner at 5:00 and had some crackers prior to checking her blood sugar last night.  This morning she rechecked it without eating and it was 116.  She has not had a big appetite lately but has felt thirsty.  In the last day she notes increased urinary frequency and nocturia but she denies any dysuria, hematuria, abdominal pain pressure, flank pain, nausea vomiting diarrhea.  She does have a history of prediabetes with labs in the chart from ten years ago  A1C of 5.7, since then has had annual physicals with mostly normal blood  sugars     No myalgias, fever, cough, rash, URI sx, abd pain, back pain No focal weakness Had some pulse/pounding sensation behind eyes -but she states it does not feel like a headache and she has had this sensation come and go several times associated with some blurry visions which she just thinks is because she is "getting older" She is more sleepy than normal for the past 2 months, she is a very energetic person usually loves mornings but that has definitely changed last couple months. Her husband has noticed a few months of urinary frequency,.  She has nocturia going to bathroom sometimes 5-6 times a night  increase in urinary freq noticed worsening yesterday during work, slight increase urinary volume -no change in urine appearance or odor no hematuria, no vaginal symptoms  No chance she could be pregnany - had ablation  She has had some night sweats for some time now her diaphoresis yesterday was definitely different been her night sweats which which she suspects is some perimenopausal symptoms  She denies any chest pain, shortness of breath, cough, wheeze, lower extremity edema, palpitations, near syncope, exertional symptoms  Does have a history of abnormal thyroid labs in the past and the strong family history of thyroid disease, strong family history of  diabetes, patient also has high cholesterol and history of whitecoat hypertension, vitamin D deficiency, vitamin B12 deficiency, anemia, neutropenia, depression, proteinuria, elevated TSH in the past which normalized, history of dysmenorrhea, obstructive sleep apnea, allergic rhinitis that is seasonal patient denies any current nasal or seasonal allergy symptoms  She denies any unintentional weight changes  Her last physical was August 2020 about 9 months ago her TSH was normal and chemistry showed a normal fasting blood sugar level no A1c was done  She had a visit about 2 months ago for some respiratory symptoms was given an inhaler  was tested for Covid and was negative  She does not have any known sick contacts but has recently gone back to teaching in person  Patient Active Problem List   Diagnosis Date Noted  . Mild episode of recurrent major depressive disorder ([place]) 03/26/2019  . Obstructive sleep apnea 12/30/2018  . Absolute anemia 01/09/2018  . Vitamin B12 deficiency 01/08/2018  . Vitamin D deficiency 01/08/2018  . Primary osteoarthritis of right knee 05/13/2017  . Overweight (BMI 25.0-29.9) 11/19/2016  . Neutropenia ([place]) 07/23/2016  . White coat hypertension 01/25/2016  . Dysmenorrhea 01/03/2016  . High cholesterol 01/01/2016  . Gallstones 12/05/2015  . Ovarian cyst 11/29/2015  . History of ectopic pregnancy 11/29/2015  . Proteinuria 08/14/2015  . Abnormal thyroid stimulating hormone (TSH) level 01/10/2015  . Allergic rhinitis, seasonal 01/10/2015  . Asymptomatic varicose veins of both lower extremities 01/10/2015      Current Outpatient Medications:  .  albuterol (VENTOLIN HFA) 108 (90 Base) MCG/ACT inhaler, Inhale 1-2 puffs into the lungs every 4 (four) hours as needed for wheezing or shortness of breath., Disp: 6.7 g, Rfl: 1 .  HALOG 0.1 % external solution, , Disp: , Rfl:  .  VITAMIN D PO, Take by mouth., Disp: , Rfl:    Allergies  Allergen Reactions  . Contrave [Naltrexone-Bupropion Hcl Er]     Palpitations, hypertension     Family History  Problem Relation Age of Onset  . Hypertension Mother   . Thyroid disease Mother   . Kidney disease Mother   . Hypertension Father   . Cancer Father        Prostate  . Diabetes Brother   . Diabetes Other   . Cancer Paternal Uncle        Prostate  . Diabetes Maternal Grandmother   . Cancer Paternal Grandfather        Prostate  . Diabetes Sister   . Schizophrenia Sister   . Breast cancer Neg Hx   . Ovarian cancer Neg Hx   . Heart disease Neg Hx      Social History   Socioeconomic History  . Marital status: Married    Spouse name:  Ron   . Number of children: 4  . Years of education: Not on file  . Highest education level: Bachelor's degree (e.g., BA, AB, BS)  Occupational History  . Occupation: Pharmacist, hospital  Tobacco Use  . Smoking status: Never Smoker  . Smokeless tobacco: Never Used  Substance and Sexual Activity  . Alcohol use: No    Alcohol/week: 0.0 standard drinks  . Drug use: No  . Sexual activity: Yes    Partners: Male    Birth control/protection: None  Other Topics Concern  . Not on file  Social History Narrative  . Not on file   Social Determinants of Health   Financial Resource Strain:   . Difficulty of Paying Living Expenses:   Food  Insecurity:   . Worried About Programme researcher, broadcasting/film/video in the Last Year:   . Barista in the Last Year:   Transportation Needs:   . Freight forwarder (Medical):   Marland Kitchen Lack of Transportation (Non-Medical):   Physical Activity:   . Days of Exercise per Week:   . Minutes of Exercise per Session:   Stress:   . Feeling of Stress :   Social Connections:   . Frequency of Communication with Friends and Family:   . Frequency of Social Gatherings with Friends and Family:   . Attends Religious Services:   . Active Member of Clubs or Organizations:   . Attends Banker Meetings:   Marland Kitchen Marital Status:   Intimate Partner Violence:   . Fear of Current or Ex-Partner:   . Emotionally Abused:   Marland Kitchen Physically Abused:   . Sexually Abused:     Chart Review Today: I personally reviewed active problem list, medication list, allergies, family history, social history, health maintenance, notes from last encounter, lab results, imaging with the patient/caregiver today.   Review of Systems 10 Systems reviewed and are negative for acute change except as noted in the HPI.     Objective:   Vitals:   09/07/19 0926  BP: 136/88  Pulse: 78  Resp: 14  Temp: 97.9 F (36.6 C)  SpO2: 98%  Weight: 191 lb 14.4 oz (87 kg)  Height: 5\' 8"  (1.727 m)    Body mass index  is 29.18 kg/m.  Physical Exam Vitals and nursing note reviewed.  Constitutional:      General: She is not in acute distress.    Appearance: Normal appearance. She is well-developed. She is not ill-appearing, toxic-appearing or diaphoretic.     Interventions: Face mask in place.  HENT:     Head: Normocephalic and atraumatic.     Jaw: There is normal jaw occlusion.     Right Ear: External ear normal.     Left Ear: External ear normal.     Nose: Mucosal edema present. No congestion.     Right Turbinates: Enlarged and swollen.     Left Turbinates: Enlarged and swollen.     Right Sinus: No maxillary sinus tenderness or frontal sinus tenderness.     Left Sinus: No maxillary sinus tenderness or frontal sinus tenderness.     Mouth/Throat:     Lips: Pink.     Mouth: Mucous membranes are moist.     Pharynx: Oropharynx is clear. Uvula midline. No pharyngeal swelling, oropharyngeal exudate, posterior oropharyngeal erythema or uvula swelling.  Eyes:     General: Lids are normal. No scleral icterus.       Right eye: No discharge.        Left eye: No discharge.     Conjunctiva/sclera: Conjunctivae normal.     Pupils: Pupils are equal, round, and reactive to light.  Neck:     Thyroid: No thyroid mass or thyroid tenderness.     Trachea: Phonation normal. No tracheal deviation.  Cardiovascular:     Rate and Rhythm: Normal rate and regular rhythm.     Pulses: Normal pulses.          Radial pulses are 2+ on the right side and 2+ on the left side.       Posterior tibial pulses are 2+ on the right side and 2+ on the left side.     Heart sounds: Normal heart sounds. No murmur. No friction rub. No  gallop.   Pulmonary:     Effort: Pulmonary effort is normal. No respiratory distress.     Breath sounds: Normal breath sounds. No stridor. No wheezing, rhonchi or rales.  Chest:     Chest wall: No tenderness.  Abdominal:     General: Bowel sounds are normal. There is no distension.     Palpations:  Abdomen is soft.     Tenderness: There is no abdominal tenderness. There is no right CVA tenderness, left CVA tenderness, guarding or rebound.  Musculoskeletal:        General: No deformity.     Cervical back: Normal range of motion and neck supple.     Right lower leg: No edema.     Left lower leg: No edema.  Lymphadenopathy:     Cervical: No cervical adenopathy.  Skin:    General: Skin is warm and dry.     Capillary Refill: Capillary refill takes less than 2 seconds.     Coloration: Skin is not ashen, cyanotic, jaundiced, mottled or pale.     Findings: No rash.  Neurological:     Mental Status: She is alert and oriented to person, place, and time.     Cranial Nerves: No dysarthria or facial asymmetry.     Motor: No weakness, tremor or abnormal muscle tone.     Coordination: Coordination is intact. Coordination normal.     Gait: Gait normal.  Psychiatric:        Attention and Perception: Attention normal.        Mood and Affect: Mood is not anxious.        Speech: Speech normal.        Behavior: Behavior normal. Behavior is cooperative.        Cognition and Memory: Cognition normal.     Comments: Pt appears somewhat tired, mildly depressed to flat affect            Assessment & Plan:    Patient is a 49 year old AA female she presents with multiple generalized and vague symptoms that became much more prominent and severe yesterday including weakness fatigue jitteriness diaphoresis, with about 1 to 2 months of increased urinary frequency and nocturia, yesterday urinary frequency increased.  She did check blood sugars at home a fasting blood sugar was mildly elevated to 119 and a random blood sugar last evening was 156 with a past history about 10 years ago of prediabetes with a A1c of 5.7 no subsequent A1c is done.  She does not have any other focal symptoms including no suspected viral etiology, no cardiac symptoms of concern.  She had diaphoresis throughout the day yesterday and  throughout the night but has been afebrile without any myalgias or hot or cold chills.  She has been having some sweats at night that she suspects are perimenopausal but yesterday throughout the day was definitely different and lasted for longer periods of time.  She does have a history of multiple nutritional deficiencies, anemia has a strong family history of thyroid disease and diabetes will do blood work to rule out any infectious etiology such as UTI, rule out diabetes, hypothyroid, anemia, electrolyte abnormality.    Did not feel that there was any symptoms that warrant a cardiac evaluation she has normal vital signs, cardiopulmonary exam was unremarkable good pulses throughout normal heart and lung exam, no edema to her lower extremities and orthopnea, PND, near syncope, exertional symptoms  Did briefly mention feeling a pulse behind her eyes and having some  intermittent blurry vision this was very brief yesterday and has happened multiple times before when not feeling weak and jittery, she does have signs of allergic rhinitis/rhinosinusitis but has no physical exam findings concerning for acute bacterial sinusitis.  I did discuss with the patient the broad work-up check, which may tell if she has new onset diabetes or hypothyroid or it may be completely normal.  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  Encouraged her with her slightly elevated fasting blood sugar to work on pushing lots of clear fluids and limiting carbs and simple sugars in her diet.    ICD-10-CM   1. Hyperglycemia  R73.9 COMPLETE METABOLIC PANEL WITH GFR    TSH    Urinalysis, Routine w reflex microscopic    POCT glycosylated hemoglobin (Hb A1C)    CANCELED: Hemoglobin A1c  2. Fatigue, unspecified type  R53.83 CBC with Differential/Platelet    TSH    T4, free     POCT glycosylated hemoglobin (Hb A1C)   generalized sx x 2 months   3. Diaphoresis  R61 CBC with Differential/Platelet    TSH    T4, free  4. Feeling jittery  R45.0 CBC with Differential/Platelet  5. Vitamin D deficiency  E55.9 COMPLETE METABOLIC PANEL WITH GFR    VITAMIN D 25 Hydroxy (Vit-D Deficiency, Fractures)   recheck, very low with last labs, not compliant with Rx supplement - unlikely to be related to sx yestday but may cause hypocalcemia or mild generalized sx  6. Vitamin B12 deficiency  E53.8 CBC with Differential/Platelet  7. Other iron deficiency anemia  D50.8 CBC with Differential/Platelet    TSH   with generalized sx recheck CBC, r/o anemia, no signs or sx of blood loss per pt, denies melena hematochezia  8. Neutropenia, unspecified type (HCC)  D70.9 CBC with Differential/Platelet  9. Hypokalemia  E87.6 COMPLETE METABOLIC PANEL WITH GFR   hx of, recheck chemistry  10. Urinary frequency  R35.0 CBC with Differential/Platelet    COMPLETE METABOLIC PANEL WITH GFR    Urinalysis, Routine w reflex microscopic    POCT glycosylated hemoglobin (Hb A1C)    CANCELED: Hemoglobin A1c   x2 months, DM, OAB?  less likely to be UTI   11. Nocturia  R35.1 COMPLETE METABOLIC PANEL WITH GFR    Urinalysis, Routine w reflex microscopic    POCT glycosylated hemoglobin (Hb A1C)    CANCELED: Hemoglobin A1c   r/o DM, may be OAB?  12. Family history of thyroid disease in mother  Z67.49 TSH    T4, free  13. Family history of diabetes mellitus in sister  Z46.3 POCT glycosylated hemoglobin (Hb A1C)    Results for orders placed or performed in visit on 09/07/19  POCT glycosylated hemoglobin (Hb A1C)  Result Value Ref Range   Hemoglobin A1C 5.2 4.0 - 5.6 %   HbA1c POC (<> result, manual entry)     HbA1c, POC (prediabetic range)     HbA1c, POC (controlled diabetic range)     Pt waited here for POC A1C results - which were neg, no DM Offered meds to try for urinary frequency/OAB   F/up and plan  pending remaining labs     Danelle Berry, PA-C 09/07/19 9:47 AM

## 2019-09-07 NOTE — Telephone Encounter (Signed)
Pt called stating her CBG was 116 at 0600 09/07/19; her blood sugar was 160 at 2130 09/06/19; at that time she was shakiness and sweating; the pt says that she is having mild shakinness this morning she also says her cbg normally runs around 70; she  Is also having frequent urination; she states that she is concerned that she may be developing pre-diabetes; the pt says she spoke with a RN last pm, and was directed to be seen within 24 hours; recommendations made per nurse triage protocol; decision tree completed; she sees Maurice Small, [facility]; there is no availability in the office until 09/09/19; pt advised to be seen at urgent care/ED for evaluation; pt also requested to schedule appt; pt offered and accepted appt with Dr Dorris Fetch, 09/09/19 at 0940; she verbalized understanding; will route to office for notification.  Reason for Disposition . New onset diabetes suspected (e.g., frequent urination, weakness, weight loss)  Answer Assessment - Initial Assessment Questions 1. BLOOD GLUCOSE: "What is your blood glucose level?"     116 and 160 2. ONSET: "When did you check the blood glucose?"     09/06/19 at 2130 and 09/07/19 at 0600 3. USUAL RANGE: "What is your glucose level usually?" (e.g., usual fasting morning value, usual evening value)  around 70 4. KETONES: "Do you check for ketones (urine or blood test strips)?" If yes, ask: "What does the test show now?"      n/a 5. TYPE 1 or 2:  "Do you know what type of diabetes you have?"  (e.g., Type 1, Type 2, Gestational; doesn't know)      n/a 6. INSULIN: "Do you take insulin?" "What type of insulin(s) do you use? What is the mode of delivery? (syringe, pen; injection or pump)?"    no 7. DIABETES PILLS: "Do you take any pills for your diabetes?" If yes, ask: "Have you missed taking any pills recently?" no 8. OTHER SYMPTOMS: "Do you have any symptoms?" (e.g., fever, frequent urination, difficulty breathing, dizziness, weakness, vomiting)  Shakiness, sweating 9. PREGNANCY: "Is there any chance you are pregnant?" "When was your last menstrual period?"     No ablation  Protocols used: DIABETES - HIGH BLOOD SUGAR-A-AH

## 2019-09-07 NOTE — Patient Instructions (Signed)
Hyperglycemia Hyperglycemia is when the sugar (glucose) level in your blood is too high. It may not cause symptoms. If you do have symptoms, they may include warning signs, such as:  Feeling more thirsty than normal.  Hunger.  Feeling tired.  Needing to pee (urinate) more than normal.  Blurry eyesight (vision). You may get other symptoms as it gets worse, such as:  Dry mouth.  Not being hungry (loss of appetite).  Fruity-smelling breath.  Weakness.  Weight gain or loss that is not planned. Weight loss may be fast.  A tingling or numb feeling in your hands or feet.  Headache.  Skin that does not bounce back quickly when it is lightly pinched and released (poor skin turgor).  Pain in your belly (abdomen).  Cuts or bruises that heal slowly. High blood sugar can happen to people who do or do not have diabetes. High blood sugar can happen slowly or quickly, and it can be an emergency. Follow these instructions at home: General instructions  Take over-the-counter and prescription medicines only as told by your doctor.  Do not use products that contain nicotine or tobacco, such as cigarettes and e-cigarettes. If you need help quitting, ask your doctor.  Limit alcohol intake to no more than 1 drink per day for nonpregnant women and 2 drinks per day for men. One drink equals 12 oz of beer, 5 oz of wine, or 1 oz of hard liquor.  Manage stress. If you need help with this, ask your doctor.  Keep all follow-up visits as told by your doctor. This is important. Eating and drinking   Stay at a healthy weight.  Exercise regularly, as told by your doctor.  Drink enough fluid, especially when you: ? Exercise. ? Get sick. ? Are in hot temperatures.  Eat healthy foods, such as: ? Low-fat (lean) proteins. ? Complex carbs (complex carbohydrates), such as whole wheat bread or brown rice. ? Fresh fruits and vegetables. ? Low-fat dairy products. ? Healthy fats.  Drink enough  fluid to keep your pee (urine) clear or pale yellow. If you have diabetes:   Make sure you know the symptoms of hyperglycemia.  Follow your diabetes management plan, as told by your doctor. Make sure you: ? Take insulin and medicines as told. ? Follow your exercise plan. ? Follow your meal plan. Eat on time. Do not skip meals. ? Check your blood sugar as often as told. Make sure to check before and after exercise. If you exercise longer or in a different way than you normally do, check your blood sugar more often. ? Follow your sick day plan whenever you cannot eat or drink normally. Make this plan ahead of time with your doctor.  Share your diabetes management plan with people in your workplace, school, and household.  Check your urine for ketones when you are ill and as told by your doctor.  Carry a card or wear jewelry that says that you have diabetes. Contact a doctor if:  Your blood sugar level is higher than 240 mg/dL (13.3 mmol/L) for 2 days in a row.  You have problems keeping your blood sugar in your target range.  High blood sugar happens often for you. Get help right away if:  You have trouble breathing.  You have a change in how you think, feel, or act (mental status).  You feel sick to your stomach (nauseous), and that feeling does not go away.  You cannot stop throwing up (vomiting). These symptoms may  be an emergency. Do not wait to see if the symptoms will go away. Get medical help right away. Call your local emergency services (911 in the [place] Do not drive yourself to the hospital. Summary  Hyperglycemia is when the sugar (glucose) level in your blood is too high.  High blood sugar can happen to people who do or do not have diabetes.  Make sure you drink enough fluids, eat healthy foods, and exercise regularly.  Contact your doctor if you have problems keeping your blood sugar in your target range. This information is not intended to replace advice given  to you by your health care provider. Make sure you discuss any questions you have with your health care provider. Document Revised: 01/08/2016 Document Reviewed: 01/08/2016 Elsevier Patient Education  [place].   Fatigue If you have fatigue, you feel tired all the time and have a lack of energy or a lack of motivation. Fatigue may make it difficult to start or complete tasks because of exhaustion. In general, occasional or mild fatigue is often a normal response to activity or life. However, long-lasting (chronic) or extreme fatigue may be a symptom of a medical condition. Follow these instructions at home: General instructions  Watch your fatigue for any changes.  Go to bed and get up at the same time every day.  Avoid fatigue by pacing yourself during the day and getting enough sleep at night.  Maintain a healthy weight. Medicines  Take over-the-counter and prescription medicines only as told by your health care provider.  Take a multivitamin, if told by your health care provider.  Do not use herbal or dietary supplements unless they are approved by your health care provider. Activity   Exercise regularly, as told by your health care provider.  Use or practice techniques to help you relax, such as yoga, tai chi, meditation, or massage therapy. Eating and drinking   Avoid heavy meals in the evening.  Eat a well-balanced diet, which includes lean proteins, whole grains, plenty of fruits and vegetables, and low-fat dairy products.  Avoid consuming too much caffeine.  Avoid the use of alcohol.  Drink enough fluid to keep your urine pale yellow. Lifestyle  Change situations that cause you stress. Try to keep your work and personal schedule in balance.  Do not use any products that contain nicotine or tobacco, such as cigarettes and e-cigarettes. If you need help quitting, ask your health care provider.  Do not use drugs. Contact a health care provider  if:  Your fatigue does not get better.  You have a fever.  You suddenly lose or gain weight.  You have headaches.  You have trouble falling asleep or sleeping through the night.  You feel angry, guilty, anxious, or sad.  You are unable to have a bowel movement (constipation).  Your skin is dry.  You have swelling in your legs or another part of your body. Get help right away if:  You feel confused.  Your vision is blurry.  You feel faint or you pass out.  You have a severe headache.  You have severe pain in your abdomen, your back, or the area between your waist and hips (pelvis).  You have chest pain, shortness of breath, or an irregular or fast heartbeat.  You are unable to urinate, or you urinate less than normal.  You have abnormal bleeding, such as bleeding from the rectum, vagina, nose, lungs, or nipples.  You vomit blood.  You have thoughts about  hurting yourself or others. If you ever feel like you may hurt yourself or others, or have thoughts about taking your own life, get help right away. You can go to your nearest emergency department or call:  Your local emergency services (911 in the [place]  A suicide crisis helpline, such as the [facility] at (602) 309-8161. This is open 24 hours a day. Summary  If you have fatigue, you feel tired all the time and have a lack of energy or a lack of motivation.  Fatigue may make it difficult to start or complete tasks because of exhaustion.  Long-lasting (chronic) or extreme fatigue may be a symptom of a medical condition.  Exercise regularly, as told by your health care provider.  Change situations that cause you stress. Try to keep your work and personal schedule in balance. This information is not intended to replace advice given to you by your health care provider. Make sure you discuss any questions you have with your health care provider. Document Revised: 11/11/2018 Document  Reviewed: 01/15/2017 Elsevier Patient Education  2020 [employer].

## 2019-09-08 ENCOUNTER — Telehealth: Payer: Self-pay

## 2019-09-08 DIAGNOSIS — R7989 Other specified abnormal findings of blood chemistry: Secondary | ICD-10-CM

## 2019-09-08 DIAGNOSIS — Z8349 Family history of other endocrine, nutritional and metabolic diseases: Secondary | ICD-10-CM

## 2019-09-08 DIAGNOSIS — R61 Generalized hyperhidrosis: Secondary | ICD-10-CM

## 2019-09-08 DIAGNOSIS — R450 Nervousness: Secondary | ICD-10-CM

## 2019-09-08 LAB — COMPLETE METABOLIC PANEL WITH GFR
AG Ratio: 1.6 (calc) (ref 1.0–2.5)
ALT: 8 U/L (ref 6–29)
AST: 11 U/L (ref 10–35)
Albumin: 4.2 g/dL (ref 3.6–5.1)
Alkaline phosphatase (APISO): 50 U/L (ref 31–125)
BUN: 13 mg/dL (ref 7–25)
CO2: 25 mmol/L (ref 20–32)
Calcium: 9.4 mg/dL (ref 8.6–10.2)
Chloride: 103 mmol/L (ref 98–110)
Creat: 0.83 mg/dL (ref 0.50–1.10)
GFR, Est African American: 97 mL/min/{1.73_m2} (ref >=60)
GFR, Est Non African American: 84 mL/min/{1.73_m2} (ref >=60)
Globulin: 2.6 g/dL (calc) (ref 1.9–3.7)
Glucose, Bld: 94 mg/dL (ref 65–99)
Potassium: 4.1 mmol/L (ref 3.5–5.3)
Sodium: 137 mmol/L (ref 135–146)
Total Bilirubin: 0.4 mg/dL (ref 0.2–1.2)
Total Protein: 6.8 g/dL (ref 6.1–8.1)

## 2019-09-08 LAB — URINALYSIS, ROUTINE W REFLEX MICROSCOPIC
Bilirubin Urine: NEGATIVE
Glucose, UA: NEGATIVE
Hgb urine dipstick: NEGATIVE
Leukocytes,Ua: NEGATIVE
Nitrite: NEGATIVE
Protein, ur: NEGATIVE
Specific Gravity, Urine: 1.027 (ref 1.001–1.03)
pH: 6 (ref 5.0–8.0)

## 2019-09-08 LAB — CBC WITH DIFFERENTIAL/PLATELET
Absolute Monocytes: 510 cells/uL (ref 200–950)
Basophils Absolute: 17 cells/uL (ref 0–200)
Basophils Relative: 0.2 %
Eosinophils Absolute: 0 cells/uL — ABNORMAL LOW (ref 15–500)
Eosinophils Relative: 0 %
HCT: 40.7 % (ref 35.0–45.0)
Hemoglobin: 13.7 g/dL (ref 11.7–15.5)
Lymphs Abs: 1258 cells/uL (ref 850–3900)
MCH: 31.1 pg (ref 27.0–33.0)
MCHC: 33.7 g/dL (ref 32.0–36.0)
MCV: 92.3 fL (ref 80.0–100.0)
MPV: 10.9 fL (ref 7.5–12.5)
Monocytes Relative: 6 %
Neutro Abs: 6715 cells/uL (ref 1500–7800)
Neutrophils Relative %: 79 %
Platelets: 347 10*3/uL (ref 140–400)
RBC: 4.41 10*6/uL (ref 3.80–5.10)
RDW: 12.4 % (ref 11.0–15.0)
Total Lymphocyte: 14.8 %
WBC: 8.5 10*3/uL (ref 3.8–10.8)

## 2019-09-08 LAB — T4, FREE: Free T4: 1.1 ng/dL (ref 0.8–1.8)

## 2019-09-08 LAB — VITAMIN D 25 HYDROXY (VIT D DEFICIENCY, FRACTURES): Vit D, 25-Hydroxy: 33 ng/mL (ref 30–100)

## 2019-09-08 LAB — TSH: TSH: 0.28 mIU/L — ABNORMAL LOW

## 2019-09-08 NOTE — Telephone Encounter (Signed)
-----   Message from Danelle Berry, [place] sent at 09/08/2019  1:53 PM EDT ----- Please notify pt of labwork  Her thyroid test show abnormal low TSH which can be suggestive of hyperthyroid which can make her have sweats or be jittery -please refer to endocrinology for further evaluation  The rest of her labs look pretty good she had no diabetes her A1c was 5.2, she had no anemia, kidney function, liver function electrolytes, vitamin D were all normal  Please have her come back in to follow up if any sx change or worsen    For now f/up with endocrinology for low TSH

## 2019-09-08 NOTE — Addendum Note (Signed)
Addended by: Danelle Berry on: 09/08/2019 05:18 PM   Modules accepted: Orders

## 2019-09-09 ENCOUNTER — Ambulatory Visit: Payer: Self-pay | Admitting: Family Medicine

## 2019-09-23 DIAGNOSIS — Z83511 Family history of glaucoma: Secondary | ICD-10-CM | POA: Diagnosis not present

## 2019-09-23 DIAGNOSIS — H5713 Ocular pain, bilateral: Secondary | ICD-10-CM | POA: Diagnosis not present

## 2019-09-24 ENCOUNTER — Encounter: Payer: Self-pay | Admitting: Pulmonary Disease

## 2019-09-24 ENCOUNTER — Ambulatory Visit: Payer: BC Managed Care – PPO | Admitting: Pulmonary Disease

## 2019-09-24 ENCOUNTER — Other Ambulatory Visit: Payer: Self-pay

## 2019-09-24 VITALS — BP 128/78 | HR 86 | Temp 98.2°F | Ht 68.0 in | Wt 193.4 lb

## 2019-09-24 DIAGNOSIS — G4733 Obstructive sleep apnea (adult) (pediatric): Secondary | ICD-10-CM | POA: Diagnosis not present

## 2019-09-24 MED ORDER — ALPRAZOLAM 0.5 MG PO TABS: 0.5000 mg | ORAL_TABLET | Freq: Three times a day (TID) | ORAL | 3 refills | Status: DC | PRN

## 2019-09-24 NOTE — Patient Instructions (Signed)
Recent diagnosis of hyperthyroidism History of obstructive sleep apnea  Tachycardia, shortness of breath with activity  Above knee related to anxiety We will initiate Xanax-0.5 mg may be used up to 3 times a day as needed, you do not need to use it if you do not feel anxious If 0.5 mg is making you feel sleepy or groggy, you may cut it to half a pill to be used up to 3 times a day as needed  We will send the prescription to a medical supply company for CPAP initiation -Your study may have become invalid if it has been over a year Prescription for auto CPAP 5-20, patient's mask of choice will be initiated A repeat home sleep study if the study is invalid/dated  I do not believe we need to do any other testing at the present time until you see the endocrinologist  A breathing study, ultrasound of the heart may be done in furtherance of the shortness of breath  Tentative follow-up in 3 months

## 2019-09-24 NOTE — Progress Notes (Signed)
Andrea Pope    [id number]    1971/01/19  Primary Care Physician:Boyce, Gerome Apley, FNP  Referring Physician: Doren Custard, FNP [address] [place],  [place] [address]  Chief complaint:   Shortness of breath, tachycardia  HPI:  Shortness of breath on moderate exertion Can walk a good distance on level ground Can tolerate only about 14 steps before she starts getting short of breath with tachycardia She sometimes has tachycardia just at rest  Was recently diagnosed with hyperthyroidism Has appointment to see endocrinology  Diagnosis of mild obstructive sleep apnea for which CPAP was recommended She is willing to initiate CPAP-still has witnessed apneas, sleepiness  Has no underlying lung disease No underlying heart disease No wheezing No family history of heart disease Mother has hypothyroidism  She has a dog No recent travel  Never smoker  Works in childcare  Outpatient Encounter Medications as of 09/24/2019  Medication Sig  . albuterol (VENTOLIN HFA) 108 (90 Base) MCG/ACT inhaler Inhale 1-2 puffs into the lungs every 4 (four) hours as needed for wheezing or shortness of breath.  . ALPRAZolam (XANAX) 0.5 MG tablet Take 1 tablet (0.5 mg total) by mouth 3 (three) times daily as needed for anxiety.  Marland Kitchen HALOG 0.1 % external solution   . VITAMIN D PO Take by mouth.   No facility-administered encounter medications on file as of 09/24/2019.    Allergies as of 09/24/2019 - Review Complete 09/07/2019  Allergen Reaction Noted  . Contrave [naltrexone-bupropion hcl er]  05/06/2019    Past Medical History:  Diagnosis Date  . Abnormal thyroid stimulating hormone level   . Allergy   . Anemia    iron deficiency  . Encounter for initial prescription of injectable contraceptive   . Female stress incontinence   . High cholesterol   . History of ectopic pregnancy 11/29/2015  . Menorrhagia with irregular cycle   . Ovarian cyst 11/29/2015  .  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 Spider veins of both lower extremities   . 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 CHOLECYSTECTOMY N/A 01/19/2016   Procedure: LAPAROSCOPIC CHOLECYSTECTOMY WITH INTRAOPERATIVE CHOLANGIOGRAM;  Surgeon: Kieth Brightly, MD;  Location: [facility] ORS;  Service: General;  Laterality: N/A;  . DILITATION & CURRETTAGE/HYSTROSCOPY WITH NOVASURE ABLATION N/A 03/24/2017   Procedure: DILATATION & CURETTAGE/HYSTEROSCOPY WITH NOVASURE ABLATION;  Surgeon: Herold Harms, MD;  Location: [facility] ORS;  Service: Gynecology;  Laterality: N/A;  . laser surgery on eye Right 10/2016    Family History  Problem Relation Age of Onset  . Hypertension Mother   . Thyroid disease Mother   . Kidney disease Mother   . Hypertension Father   . Cancer Father        Prostate  . Diabetes Brother   . Diabetes Other   . Cancer Paternal Uncle        Prostate  . Diabetes Maternal Grandmother   . Cancer Paternal Grandfather        Prostate  . Diabetes Sister   . Schizophrenia Sister   . Breast cancer Neg Hx   . Ovarian cancer Neg Hx   . Heart disease Neg Hx     Social History   Socioeconomic History  . Marital status: Married    Spouse  name: Ron   . Number of children: 4  . Years of education: Not on file  . Highest education level: Bachelor's degree (e.g., BA, AB, BS)  Occupational History  . Occupation: Pharmacist, hospital  Tobacco Use  . Smoking status: Never Smoker  . Smokeless tobacco: Never Used  Substance and Sexual Activity  . Alcohol use: No    Alcohol/week: 0.0 standard drinks  . Drug use: No  . Sexual activity: Yes    Partners: Male    Birth control/protection: None  Other Topics Concern  . Not on file  Social  History Narrative  . Not on file   Social Determinants of Health   Financial Resource Strain:   . Difficulty of Paying Living Expenses:   Food Insecurity:   . Worried About Charity fundraiser in the Last Year:   . Arboriculturist in the Last Year:   Transportation Needs:   . Film/video editor (Medical):   Marland Kitchen Lack of Transportation (Non-Medical):   Physical Activity:   . Days of Exercise per Week:   . Minutes of Exercise per Session:   Stress:   . Feeling of Stress :   Social Connections:   . Frequency of Communication with Friends and Family:   . Frequency of Social Gatherings with Friends and Family:   . Attends Religious Services:   . Active Member of Clubs or Organizations:   . Attends Archivist Meetings:   Marland Kitchen Marital Status:   Intimate Partner Violence:   . Fear of Current or Ex-Partner:   . Emotionally Abused:   Marland Kitchen Physically Abused:   . Sexually Abused:     Review of Systems  Constitutional: Positive for fatigue.  HENT: Negative.   Respiratory: Positive for apnea.   Cardiovascular: Positive for palpitations. Negative for chest pain.  Psychiatric/Behavioral: Positive for sleep disturbance.    Vitals:   09/24/19 1514  BP: 128/78  Pulse: 86  Temp: 98.2 F (36.8 C)  SpO2: 97%     Physical Exam  Constitutional: She appears well-developed.  HENT:  Head: Normocephalic.  Eyes: Right eye exhibits no discharge. Left eye exhibits no discharge.  Neck: No tracheal deviation present. No thyromegaly present.  Cardiovascular: Normal rate and regular rhythm.  Pulmonary/Chest: Effort normal and breath sounds normal. No respiratory distress. She has no wheezes. She has no rales. She exhibits no tenderness.  Neurological: She is alert.  Skin: Skin is warm.  Psychiatric: She has a normal mood and affect.   Data Reviewed: Sleep study did reveal mild obstructive sleep apnea  Assessment:  Obstructive sleep apnea  Hyperthyroidism  Shortness of breath,  tachycardia-both at rest and with minimal exertion -This may be related to hypothyroidism -Associated anxiety  Initiation of Xanax for anxiety  Sleep apnea is mild -She is willing to try CPAP  Plan/Recommendations: Prescription for CPAP therapy 5-20, auto titrating CPAP  Xanax 0.5 p.o. 3 times daily as needed  Follow-up with endocrinologist  Shortness of breath may include a PFT and echocardiogram-I do not believe this is appropriate at present until she sees endocrinology  Follow-up in 3 months  Call with significant concerns   Sherrilyn Rist MD [facility] 09/24/2019, 3:41 PM  CC: Hubbard Hartshorn, FNP

## 2019-09-25 ENCOUNTER — Telehealth: Payer: Self-pay | Admitting: Internal Medicine

## 2019-09-25 NOTE — Telephone Encounter (Signed)
Patient called the answering service saying yesterday Xanax was initiated by Dr. Val Eagle but this never got to the pharmacy.  She reluctantly agreed to wait till the start of the week to get the Xanax sent.  Currently she is out of town review of the chart indicates that chronic anxiety therefore she agreed that she could wait for another 48 hours for the prescription to be sent in  Please do the prescription per Dr Val Eagle  Thanks    SIGNATURE    Dr. Kalman Shan, M.D., F.C.C.P,  Pulmonary and Critical Care Medicine Staff Physician, [facility] Center Director - Interstitial Lung Disease  Program  Pulmonary Fibrosis [facility] at [facility] [place], [place], [address]  Pager: [phone], If no answer or between  15:00h - 7:00h: call [phone] Telephone: [phone]  4:55 PM 09/25/2019

## 2019-09-26 ENCOUNTER — Other Ambulatory Visit: Payer: Self-pay

## 2019-09-26 ENCOUNTER — Encounter: Payer: Self-pay | Admitting: Emergency Medicine

## 2019-09-26 DIAGNOSIS — R531 Weakness: Secondary | ICD-10-CM | POA: Diagnosis not present

## 2019-09-26 DIAGNOSIS — Z79899 Other long term (current) drug therapy: Secondary | ICD-10-CM | POA: Diagnosis not present

## 2019-09-26 DIAGNOSIS — F419 Anxiety disorder, unspecified: Secondary | ICD-10-CM | POA: Insufficient documentation

## 2019-09-26 DIAGNOSIS — R002 Palpitations: Secondary | ICD-10-CM | POA: Insufficient documentation

## 2019-09-26 LAB — BASIC METABOLIC PANEL
Anion gap: 9 (ref 5–15)
BUN: 16 mg/dL (ref 6–20)
CO2: 25 mmol/L (ref 22–32)
Calcium: 8.8 mg/dL — ABNORMAL LOW (ref 8.9–10.3)
Chloride: 105 mmol/L (ref 98–111)
Creatinine, Ser: 0.87 mg/dL (ref 0.44–1.00)
GFR calc Af Amer: >60 mL/min (ref >60)
GFR calc non Af Amer: >60 mL/min (ref >60)
Glucose, Bld: 121 mg/dL — ABNORMAL HIGH (ref 70–99)
Potassium: 3.9 mmol/L (ref 3.5–5.1)
Sodium: 139 mmol/L (ref 135–145)

## 2019-09-26 LAB — URINALYSIS, COMPLETE (UACMP) WITH MICROSCOPIC
Bilirubin Urine: NEGATIVE
Glucose, UA: NEGATIVE mg/dL
Hgb urine dipstick: NEGATIVE
Ketones, ur: NEGATIVE mg/dL
Leukocytes,Ua: NEGATIVE
Nitrite: NEGATIVE
Protein, ur: NEGATIVE mg/dL
Specific Gravity, Urine: 1.015 (ref 1.005–1.030)
pH: 5 (ref 5.0–8.0)

## 2019-09-26 LAB — CBC
HCT: 40.6 % (ref 36.0–46.0)
Hemoglobin: 13.8 g/dL (ref 12.0–15.0)
MCH: 30.6 pg (ref 26.0–34.0)
MCHC: 34 g/dL (ref 30.0–36.0)
MCV: 90 fL (ref 80.0–100.0)
Platelets: 383 10*3/uL (ref 150–400)
RBC: 4.51 MIL/uL (ref 3.87–5.11)
RDW: 12.7 % (ref 11.5–15.5)
WBC: 5.9 10*3/uL (ref 4.0–10.5)
nRBC: 0 % (ref 0.0–0.2)

## 2019-09-26 LAB — POCT PREGNANCY, URINE: Preg Test, Ur: NEGATIVE

## 2019-09-26 LAB — TROPONIN I (HIGH SENSITIVITY): Troponin I (High Sensitivity): 4 ng/L (ref <18)

## 2019-09-26 NOTE — ED Triage Notes (Signed)
Pt arrives POV to triage with c/o palpitations and weakness. Pt states that she has an endocrinologist appointment June 24 due to possibly having hyperthyroidism. Pt is in NAD.

## 2019-09-27 ENCOUNTER — Emergency Department
Admission: EM | Admit: 2019-09-27 | Discharge: 2019-09-27 | Disposition: A | Discharge status: Home / Self Care | Payer: BC Managed Care – PPO | Attending: Emergency Medicine | Admitting: Emergency Medicine

## 2019-09-27 ENCOUNTER — Ambulatory Visit (INDEPENDENT_AMBULATORY_CARE_PROVIDER_SITE_OTHER): Payer: BC Managed Care – PPO

## 2019-09-27 ENCOUNTER — Encounter: Payer: Self-pay | Admitting: Cardiology

## 2019-09-27 ENCOUNTER — Emergency Department: Payer: BC Managed Care – PPO

## 2019-09-27 ENCOUNTER — Other Ambulatory Visit: Payer: Self-pay | Admitting: Pulmonary Disease

## 2019-09-27 ENCOUNTER — Encounter: Payer: Self-pay | Admitting: Radiology

## 2019-09-27 ENCOUNTER — Telehealth: Payer: Self-pay | Admitting: Pulmonary Disease

## 2019-09-27 ENCOUNTER — Ambulatory Visit (INDEPENDENT_AMBULATORY_CARE_PROVIDER_SITE_OTHER): Payer: BC Managed Care – PPO | Admitting: Cardiology

## 2019-09-27 VITALS — BP 136/88 | HR 71 | Ht 68.0 in | Wt 191.0 lb

## 2019-09-27 DIAGNOSIS — R531 Weakness: Secondary | ICD-10-CM | POA: Diagnosis not present

## 2019-09-27 DIAGNOSIS — F419 Anxiety disorder, unspecified: Secondary | ICD-10-CM | POA: Diagnosis not present

## 2019-09-27 DIAGNOSIS — R002 Palpitations: Secondary | ICD-10-CM | POA: Diagnosis not present

## 2019-09-27 DIAGNOSIS — Z79899 Other long term (current) drug therapy: Secondary | ICD-10-CM | POA: Diagnosis not present

## 2019-09-27 LAB — TSH: TSH: 1.099 u[IU]/mL (ref 0.350–4.500)

## 2019-09-27 LAB — TROPONIN I (HIGH SENSITIVITY): Troponin I (High Sensitivity): 3 ng/L (ref <18)

## 2019-09-27 LAB — FIBRIN DERIVATIVES D-DIMER (ARMC ONLY): Fibrin derivatives D-dimer (ARMC): 140.96 ng/mL (FEU) (ref 0.00–499.00)

## 2019-09-27 LAB — T4, FREE: Free T4: 0.82 ng/dL (ref 0.61–1.12)

## 2019-09-27 MED ORDER — ALPRAZOLAM 0.5 MG PO TABS
0.5000 mg | ORAL_TABLET | Freq: Three times a day (TID) | ORAL | 3 refills | Status: DC | PRN
Start: 2019-09-27 — End: 2019-12-03

## 2019-09-27 MED ORDER — SODIUM CHLORIDE 0.9 % IV BOLUS
500.0000 mL | Freq: Once | INTRAVENOUS | Status: AC
  Administered 2019-09-27: 500 mL via INTRAVENOUS

## 2019-09-27 MED ORDER — LORAZEPAM 2 MG/ML IJ SOLN
0.5000 mg | Freq: Once | INTRAMUSCULAR | Status: AC
  Administered 2019-09-27: 0.5 mg via INTRAVENOUS
  Filled 2019-09-27: qty 1

## 2019-09-27 NOTE — Discharge Instructions (Signed)
1.  Decrease caffeine intake whenever possible. 2.  Return to the ER for worsening symptoms, persistent vomiting, difficulty breathing or other concerns.

## 2019-09-27 NOTE — Progress Notes (Signed)
Cardiology Office Note:    Date:  09/27/2019   ID:  Andrea Pope, DOB 02/25/1971, MRN [id number]  PCP:  Doren Custard, FNP  Cardiologist:  Debbe Odea, MD  Electrophysiologist:  None   Referring MD: Doren Custard, FNP   Chief Complaint  Patient presents with  . New Patient (Initial Visit)    ED follow up for Weakness in arms and legs and Heart Palpitation. Meds reviewed verbally with patient.    Andrea Pope is a 49 y.o. female who is being seen today for the evaluation of palpitations at the request of Doren Custard, FNP.   History of Present Illness:    Andrea Pope is a 49 y.o. female with no significant cardiac history who presents due to palpitations.  Patient states having symptoms of worsening palpitations over the past 3 weeks.  Symptoms alcohol daily lasting couple of minutes.  Symptoms associated with shortness of breath and dizziness.  She denies syncope.  She has a smart watch and noticed her heart rates increase up to 130s beats per minute while she is at rest sitting.  She also notes occasional irregular heartbeats.  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  She was referred to endocrinology and appointment is scheduled for next month.  She was seen in the ED last evening due to worsening palpitations.  Work-up with EKG showed sinus rhythm, TSH and free T4 were within normal limits, troponin was normal.  She states symptoms occur daily.  She denies any history of heart disease.  She used to drink 1 cup of coffee a day but has stopped drinking coffee saying symptoms began.  Past Medical History:  Diagnosis Date  . Abnormal thyroid stimulating hormone level   . Allergy   . Anemia    iron deficiency  . Encounter for initial prescription of injectable contraceptive   . Female stress incontinence   . High cholesterol   . History of ectopic pregnancy 11/29/2015  . Menorrhagia with irregular cycle   .  Ovarian cyst 11/29/2015  . 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 Spider veins of both lower extremities   . 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 CHOLECYSTECTOMY N/A 01/19/2016   Procedure: LAPAROSCOPIC CHOLECYSTECTOMY WITH INTRAOPERATIVE CHOLANGIOGRAM;  Surgeon: Kieth Brightly, MD;  Location: [facility] ORS;  Service: General;  Laterality: N/A;  . DILITATION & CURRETTAGE/HYSTROSCOPY WITH NOVASURE ABLATION N/A 03/24/2017   Procedure: DILATATION & CURETTAGE/HYSTEROSCOPY WITH NOVASURE ABLATION;  Surgeon: Herold Harms, MD;  Location: [facility] ORS;  Service: Gynecology;  Laterality: N/A;  . laser surgery on eye Right 10/2016    Current Medications: Current Meds  Medication Sig  . albuterol (VENTOLIN HFA) 108 (90 Base) MCG/ACT inhaler Inhale 1-2 puffs into the lungs every 4 (four) hours as needed for wheezing or shortness of breath.  . ALPRAZolam (XANAX) 0.5 MG tablet Take 1 tablet (0.5 mg total) by mouth 3 (three) times daily as needed for anxiety.  Marland Kitchen HALOG 0.1 % external solution   . VITAMIN D PO Take by mouth.     Allergies:   Contrave [naltrexone-bupropion hcl er]   Social History   Socioeconomic History  . Marital status: Married    Spouse name: Ron   . Number of  children: 4  . Years of education: Not on file  . Highest education level: Bachelor's degree (e.g., BA, AB, BS)  Occupational History  . Occupation: Runner, broadcasting/film/video  Tobacco Use  . Smoking status: Never Smoker  . Smokeless tobacco: Never Used  Substance and Sexual Activity  . Alcohol use: No    Alcohol/week: 0.0 standard drinks  . Drug use: No  . Sexual activity: Yes    Partners: Male    Birth control/protection: None  Other Topics Concern  . Not on file  Social  History Narrative  . Not on file   Social Determinants of Health   Financial Resource Strain:   . Difficulty of Paying Living Expenses:   Food Insecurity:   . Worried About Programme researcher, broadcasting/film/video in the Last Year:   . Barista in the Last Year:   Transportation Needs:   . Freight forwarder (Medical):   Marland Kitchen Lack of Transportation (Non-Medical):   Physical Activity:   . Days of Exercise per Week:   . Minutes of Exercise per Session:   Stress:   . Feeling of Stress :   Social Connections:   . Frequency of Communication with Friends and Family:   . Frequency of Social Gatherings with Friends and Family:   . Attends Religious Services:   . Active Member of Clubs or Organizations:   . Attends Banker Meetings:   Marland Kitchen Marital Status:      Family History: The patient's family history includes Cancer in her father, paternal grandfather, and paternal uncle; Diabetes in her brother, maternal grandmother, sister, and another family member; Hypertension in her father and mother; Kidney disease in her mother; Schizophrenia in her sister; Thyroid disease in her mother. There is no history of Breast cancer, Ovarian cancer, or Heart disease.  ROS:   Please see the history of present illness.     All other systems reviewed and are negative.  EKGs/Labs/Other Studies Reviewed:    The following studies were reviewed today:   EKG:  EKG is  ordered today.  The ekg ordered today demonstrates sinus rhythm, normal ECG.  Recent Labs: 09/07/2019: ALT 8 09/26/2019: BUN 16; Creatinine, Ser 0.87; Hemoglobin 13.8; Platelets 383; Potassium 3.9; Sodium 139 09/27/2019: TSH 1.099  Recent Lipid Panel    Component Value Date/Time   CHOL 188 12/30/2018 0000   TRIG 144 12/30/2018 0000   HDL 61 12/30/2018 0000   CHOLHDL 3.1 12/30/2018 0000   VLDL 21 11/19/2016 1513   LDLCALC 103 (H) 12/30/2018 0000    Physical Exam:    VS:  BP 136/88 (BP Location: Right Arm, Patient Position: Sitting,  Cuff Size: Normal)   Pulse 71   Ht 5\' 8"  (1.727 m)   Wt 191 lb (86.6 kg)   SpO2 98%   BMI 29.04 kg/m     Wt Readings from Last 3 Encounters:  09/27/19 191 lb (86.6 kg)  09/26/19 190 lb (86.2 kg)  09/24/19 193 lb 6.4 oz (87.7 kg)     GEN:  Well nourished, well developed in no acute distress HEENT: Normal NECK: No JVD; No carotid bruits LYMPHATICS: No lymphadenopathy CARDIAC: RRR, no murmurs, rubs, gallops RESPIRATORY:  Clear to auscultation without rales, wheezing or rhonchi  ABDOMEN: Soft, non-tender, non-distended MUSCULOSKELETAL:  No edema; No deformity  SKIN: Warm and dry NEUROLOGIC:  Alert and oriented x 3 PSYCHIATRIC:  Normal affect   ASSESSMENT:    1. Palpitations    PLAN:    In  order of problems listed above:  1. Patient with 3-week history of palpitations.  Will evaluate patient with a cardiac monitor x2 weeks for any significant arrhythmias.  She does not have any cardiac risk factors.  Follow-up after cardiac monitor.  This note was generated in part or whole with voice recognition software. Voice recognition is usually quite accurate but there are transcription errors that can and very often do occur. I apologize for any typographical errors that were not detected and corrected.  Medication Adjustments/Labs and Tests Ordered: Current medicines are reviewed at length with the patient today.  Concerns regarding medicines are outlined above.  Orders Placed This Encounter  Procedures  . LONG TERM MONITOR (3-14 DAYS)  . EKG 12-Lead   No orders of the defined types were placed in this encounter.   Patient Instructions  Medication Instructions:  No Changes *If you need a refill on your cardiac medications before your next appointment, please call your pharmacy*   Lab Work: None Ordered If you have labs (blood work) drawn today and your tests are completely normal, you will receive your results only by: Marland Kitchen MyChart Message (if you have MyChart) OR . A  paper copy in the mail If you have any lab test that is abnormal or we need to change your treatment, we will call you to review the results.   Testing/Procedures:  Your physician has recommended that you wear a Zio monitor. This monitor is a medical device that records the heart's electrical activity. Doctors most often use these monitors to diagnose arrhythmias. Arrhythmias are problems with the speed or rhythm of the heartbeat. The monitor is a small device applied to your chest. You can wear one while you do your normal daily activities. While wearing this monitor if you have any symptoms to push the button and record what you felt. Once you have worn this monitor for the period of time provider prescribed (Usually 14 days), you will return the monitor device in the postage paid box. Once it is returned they will download the data collected and provide [place] with a report which the provider will then review and we will call you with those results. Important tips:  1. Avoid showering during the first 24 hours of wearing the monitor. 2. Avoid excessive sweating to help maximize wear time. 3. Do not submerge the device, no hot tubs, and no swimming pools. 4. Keep any lotions or oils away from the patch. 5. After 24 hours you may shower with the patch on. Take brief showers with your back facing the shower head.  6. Do not remove patch once it has been placed because that will interrupt data and decrease adhesive wear time. 7. Push the button when you have any symptoms and write down what you were feeling. 8. Once you have completed wearing your monitor, remove and place into box which has postage paid and place in your outgoing mailbox.  9. If for some reason you have misplaced your box then call our office and we can provide another box and/or mail it off for you.        Follow-Up: At [facility], you and your health needs are our priority.  As part of our continuing mission to provide you  with exceptional heart care, we have created designated Provider Care Teams.  These Care Teams include your primary Cardiologist (physician) and Advanced Practice Providers (APPs -  Physician Assistants and Nurse Practitioners) who all work together to provide you with the  care you need, when you need it.  We recommend signing up for the patient portal called "MyChart".  Sign up information is provided on this After Visit Summary.  MyChart is used to connect with patients for Virtual Visits (Telemedicine).  Patients are able to view lab/test results, encounter notes, upcoming appointments, etc.  Non-urgent messages can be sent to your provider as well.   To learn more about what you can do with MyChart, go to [email or web address].    Your next appointment:   Follow up 5-6 weeks, after zio monitor results.  The format for your next appointment:   In Person  Provider:   Debbe Odea, MD   Other Instructions N/A    Signed, Debbe Odea, MD  09/27/2019 4:42 PM    [facility]

## 2019-09-27 NOTE — ED Provider Notes (Signed)
[facility] Emergency Department Provider Note   ____________________________________________   First MD Initiated Contact with Patient 09/27/19 [phone]     (approximate)  I have reviewed the triage vital signs and the nursing notes.   HISTORY  Chief Complaint Weakness    HPI TYLEAH LOH is a 49 y.o. female who presents to the ED from home with a chief complaint of heart palpitations and generalized weakness.  Patient has been experiencing history of same.  Recently told 1 week ago by her PCP that she has hyperthyroidism and referred to endocrinology for follow-up.  Had pulmonology appointment several days ago for same; prescribed Xanax which has not yet been filled by the pharmacy and diagnosed with OSA.  Patient is awaiting approval for her machine.  Reports palpitations frequently after she eats.  Awoke tonight with palpitations and feeling of generalized weakness.  Denies recent fever, cough, chest pain, abdominal pain, nausea, vomiting or diarrhea.  Denies recent travel, trauma or OCP use.      Past Medical History:  Diagnosis Date  . Abnormal thyroid stimulating hormone level   . Allergy   . Anemia    iron deficiency  . Encounter for initial prescription of injectable contraceptive   . Female stress incontinence   . High cholesterol   . History of ectopic pregnancy 11/29/2015  . Menorrhagia with irregular cycle   . Ovarian cyst 11/29/2015  . 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 Spider veins of both lower extremities   . 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 Mild episode of recurrent major depressive disorder ([place]) 03/26/2019  . Obstructive sleep apnea 12/30/2018  . Absolute anemia 01/09/2018    . Vitamin B12 deficiency 01/08/2018  . Vitamin D deficiency 01/08/2018  . Primary osteoarthritis of right knee 05/13/2017  . Overweight (BMI 25.0-29.9) 11/19/2016  . Neutropenia ([place]) 07/23/2016  . White coat hypertension 01/25/2016  . Dysmenorrhea 01/03/2016  . High cholesterol 01/01/2016  . Gallstones 12/05/2015  . Ovarian cyst 11/29/2015  . History of ectopic pregnancy 11/29/2015  . Proteinuria 08/14/2015  . Abnormal thyroid stimulating hormone (TSH) level 01/10/2015  . Allergic rhinitis, seasonal 01/10/2015  . Asymptomatic varicose veins of both lower extremities 01/10/2015    Past Surgical History:  Procedure Laterality Date  . CHOLECYSTECTOMY N/A 01/19/2016   Procedure: LAPAROSCOPIC CHOLECYSTECTOMY WITH INTRAOPERATIVE CHOLANGIOGRAM;  Surgeon: Christene Lye, MD;  Location: [facility] ORS;  Service: General;  Laterality: N/A;  . DILITATION & CURRETTAGE/HYSTROSCOPY WITH NOVASURE ABLATION N/A 03/24/2017   Procedure: DILATATION & CURETTAGE/HYSTEROSCOPY WITH NOVASURE ABLATION;  Surgeon: Brayton Mars, MD;  Location: [facility] ORS;  Service: Gynecology;  Laterality: N/A;  . laser surgery on eye Right 10/2016    Prior to Admission medications   Medication Sig Start Date End Date Taking? Authorizing Provider  albuterol (VENTOLIN HFA) 108 (90 Base) MCG/ACT inhaler Inhale 1-2 puffs into the lungs every 4 (four) hours as needed for wheezing or shortness of breath. 07/06/19   Towanda Malkin, MD  ALPRAZolam Duanne Moron) 0.5 MG tablet Take 1 tablet (0.5 mg total) by mouth 3 (three) times daily as needed for anxiety. 09/24/19   Sherrilyn Rist A, MD  HALOG 0.1 % external solution  06/23/19   [provider]  VITAMIN D PO Take by mouth.    [provider]    Allergies Contrave [naltrexone-bupropion hcl er]  Family History  Problem Relation Age of Onset  . Hypertension Mother   . Thyroid disease Mother   . Kidney disease Mother   . Hypertension Father   .  Cancer Father        Prostate  . Diabetes Brother   . Diabetes Other   . Cancer Paternal Uncle        Prostate  . Diabetes Maternal Grandmother   . Cancer Paternal Grandfather        Prostate  . Diabetes Sister   . Schizophrenia Sister   . Breast cancer Neg Hx   . Ovarian cancer Neg Hx   . Heart disease Neg Hx     Social History Social History   Tobacco Use  . Smoking status: Never Smoker  . Smokeless tobacco: Never Used  Substance Use Topics  . Alcohol use: No    Alcohol/week: 0.0 standard drinks  . Drug use: No    Review of Systems  Constitutional: Positive for generalized weakness.  No fever/chills Eyes: No visual changes. ENT: No sore throat. Cardiovascular: Positive for palpitations.  Denies chest pain. Respiratory: Denies shortness of breath. Gastrointestinal: No abdominal pain.  No nausea, no vomiting.  No diarrhea.  No constipation. Genitourinary: Negative for dysuria. Musculoskeletal: Negative for back pain. Skin: Negative for rash. Neurological: Negative for headaches, focal weakness or numbness. Psychiatric: Positive for anxiety.  ____________________________________________   PHYSICAL EXAM:  VITAL SIGNS: ED Triage Vitals  Enc Vitals Group     BP 09/26/19 2217 (!) 176/106     Pulse Rate 09/26/19 2217 87     Resp 09/26/19 2217 18     Temp 09/26/19 2217 98.4 F (36.9 C)     Temp Source 09/26/19 2217 Oral     SpO2 09/26/19 2217 99 %     Weight 09/26/19 2218 190 lb (86.2 kg)     Height 09/26/19 2218 5\' 8"  (1.727 m)     Head Circumference --      Peak Flow --      Pain Score 09/26/19 2218 6     Pain Loc --      Pain Edu? --      Excl. in GC? --     Constitutional: Alert and oriented. Well appearing and in no acute distress. Eyes: Conjunctivae are normal. PERRL. EOMI. Head: Atraumatic. Nose: No congestion/rhinnorhea. Mouth/Throat: Mucous membranes are moist.  Oropharynx non-erythematous. Neck: No stridor.  No thyromegaly. Cardiovascular:  Normal rate, regular rhythm. Grossly normal heart sounds.  Good peripheral circulation. Respiratory: Normal respiratory effort.  No retractions. Lungs CTAB. Gastrointestinal: Soft and nontender to light or deep palpation. No distention. No abdominal bruits. No CVA tenderness. Musculoskeletal: No lower extremity tenderness nor edema.  No joint effusions. Neurologic:  Normal speech and language. No gross focal neurologic deficits are appreciated. No gait instability. Skin:  Skin is warm, dry and intact. No rash noted. Psychiatric: Mood and affect are normal. Speech and behavior are normal.  ____________________________________________   LABS (all labs ordered are listed, but only abnormal results are displayed)  Labs Reviewed  BASIC METABOLIC PANEL - Abnormal; Notable for the following components:      Result Value   Glucose, Bld 121 (*)    Calcium 8.8 (*)    All other components within normal limits  URINALYSIS, COMPLETE (UACMP) WITH MICROSCOPIC - Abnormal; Notable for the following components:   Color, Urine YELLOW (*)    APPearance HAZY (*)  Bacteria, UA RARE (*)    All other components within normal limits  CBC  TSH  T4, FREE  FIBRIN DERIVATIVES D-DIMER ([facility] ONLY)  POC URINE PREG, ED  POCT PREGNANCY, URINE  CBG MONITORING, ED  TROPONIN I (HIGH SENSITIVITY)  TROPONIN I (HIGH SENSITIVITY)   ____________________________________________  EKG  ED ECG REPORT I, Toyia Jelinek J, the attending physician, personally viewed and interpreted this ECG.   Date: 09/27/2019  EKG Time: 2215  Rate: 83  Rhythm: normal EKG, normal sinus rhythm  Axis: Normal  Intervals:none  ST&T Change: Nonspecific  ____________________________________________  RADIOLOGY  ED MD interpretation: No acute cardiopulmonary process  Official radiology report(s): DG Chest 2 View  Result Date: 09/27/2019 CLINICAL DATA:  Palpitations EXAM: CHEST - 2 VIEW COMPARISON:  06/09/2018 FINDINGS: Normal heart  size and mediastinal contours. No acute infiltrate or edema. No effusion or pneumothorax. No acute osseous findings. IMPRESSION: Negative chest. Electronically Signed   By: Marnee Spring M.D.   On: 09/27/2019 05:03    ____________________________________________   PROCEDURES  Procedure(s) performed (including Critical Care):  Procedures   ____________________________________________   INITIAL IMPRESSION / ASSESSMENT AND PLAN / ED COURSE  As part of my medical decision making, I reviewed the following data within the electronic MEDICAL RECORD NUMBER Nursing notes reviewed and incorporated, Labs reviewed, EKG interpreted, Old chart reviewed, Radiograph reviewed and Notes from prior ED visits     ALIANY FIORENZA was evaluated in Emergency Department on 09/27/2019 for the symptoms described in the history of present illness. She was evaluated in the context of the global COVID-19 pandemic, which necessitated consideration that the patient might be at risk for infection with the SARS-CoV-2 virus that causes COVID-19. Institutional protocols and algorithms that pertain to the evaluation of patients at risk for COVID-19 are in a state of rapid change based on information released by regulatory bodies including the CDC and federal and state organizations. These policies and algorithms were followed during the patient's care in the ED.    49 year old female presenting with generalized weakness and palpitations.  Differential diagnosis includes but is not limited to infectious, metabolic, endocrinologic etiologies, etc.  Laboratory results thus far unremarkable.  Will repeat troponin, check thyroid panel, D-dimer.  Obtain chest x-ray.  Initiate IV fluid hydration, low-dose Ativan.   Clinical Course as of Sep 27 654  Mon Sep 27, 2019  4235 Patient resting no acute distress.  Updated her of all test results including normal TSH and free T4.  Will refer her to cardiology for outpatient follow-up.   She is to pick up her Xanax prescribed by her pulmonologist today.  Strict return precautions given.  Patient verbalizes understanding and agrees with plan of care.   [JS]    Clinical Course User Index [JS] Irean Hong, MD     ____________________________________________   FINAL CLINICAL IMPRESSION(S) / ED DIAGNOSES  Final diagnoses:  Generalized weakness  Palpitations     ED Discharge Orders    None       Note:  This document was prepared using Dragon voice recognition software and may include unintentional dictation errors.   Irean Hong, MD 09/27/19 [phone]

## 2019-09-27 NOTE — Telephone Encounter (Signed)
Patient went to the ED today. Dr. Val Eagle please advise.

## 2019-09-27 NOTE — ED Notes (Signed)
See triage notes; pt states she is having heart palpitations, feels some anxiety, feels weak.  Pt states she has an upcoming endocrinologist appt and saw the pulmonologist this past Friday.

## 2019-09-27 NOTE — Telephone Encounter (Signed)
Addressed.

## 2019-09-27 NOTE — Patient Instructions (Addendum)
Medication Instructions:  No Changes *If you need a refill on your cardiac medications before your next appointment, please call your pharmacy*   Lab Work: None Ordered If you have labs (blood work) drawn today and your tests are completely normal, you will receive your results only by: Marland Kitchen MyChart Message (if you have MyChart) OR . A paper copy in the mail If you have any lab test that is abnormal or we need to change your treatment, we will call you to review the results.   Testing/Procedures:  Your physician has recommended that you wear a Zio monitor. This monitor is a medical device that records the heart's electrical activity. Doctors most often use these monitors to diagnose arrhythmias. Arrhythmias are problems with the speed or rhythm of the heartbeat. The monitor is a small device applied to your chest. You can wear one while you do your normal daily activities. While wearing this monitor if you have any symptoms to push the button and record what you felt. Once you have worn this monitor for the period of time provider prescribed (Usually 14 days), you will return the monitor device in the postage paid box. Once it is returned they will download the data collected and provide [place] with a report which the provider will then review and we will call you with those results. Important tips:  1. Avoid showering during the first 24 hours of wearing the monitor. 2. Avoid excessive sweating to help maximize wear time. 3. Do not submerge the device, no hot tubs, and no swimming pools. 4. Keep any lotions or oils away from the patch. 5. After 24 hours you may shower with the patch on. Take brief showers with your back facing the shower head.  6. Do not remove patch once it has been placed because that will interrupt data and decrease adhesive wear time. 7. Push the button when you have any symptoms and write down what you were feeling. 8. Once you have completed wearing your monitor, remove and  place into box which has postage paid and place in your outgoing mailbox.  9. If for some reason you have misplaced your box then call our office and we can provide another box and/or mail it off for you.        Follow-Up: At [facility], you and your health needs are our priority.  As part of our continuing mission to provide you with exceptional heart care, we have created designated Provider Care Teams.  These Care Teams include your primary Cardiologist (physician) and Advanced Practice Providers (APPs -  Physician Assistants and Nurse Practitioners) who all work together to provide you with the care you need, when you need it.  We recommend signing up for the patient portal called "MyChart".  Sign up information is provided on this After Visit Summary.  MyChart is used to connect with patients for Virtual Visits (Telemedicine).  Patients are able to view lab/test results, encounter notes, upcoming appointments, etc.  Non-urgent messages can be sent to your provider as well.   To learn more about what you can do with MyChart, go to [email or web address].    Your next appointment:   Follow up 5-6 weeks, after zio monitor results.  The format for your next appointment:   In Person  Provider:   Debbe Odea, MD   Other Instructions N/A

## 2019-09-27 NOTE — Telephone Encounter (Signed)
Dr.Olalere can you please advise.  Spoke with regarding script for Xanax 1 tab 0.5mg  by mouth 3 times a day PRN was phone into her pharmacy. I called [facility] to verify about this script they never received the script. Can you please sent in a new script to [facility] . Thank you

## 2019-09-27 NOTE — Telephone Encounter (Signed)
Called patient  Prescription for Xanax sent in

## 2019-10-07 ENCOUNTER — Ambulatory Visit: Payer: BC Managed Care – PPO | Admitting: Internal Medicine

## 2019-10-14 DIAGNOSIS — G4733 Obstructive sleep apnea (adult) (pediatric): Secondary | ICD-10-CM | POA: Diagnosis not present

## 2019-10-28 ENCOUNTER — Ambulatory Visit (INDEPENDENT_AMBULATORY_CARE_PROVIDER_SITE_OTHER): Payer: BC Managed Care – PPO | Admitting: Family Medicine

## 2019-10-28 ENCOUNTER — Encounter: Payer: Self-pay | Admitting: Family Medicine

## 2019-10-28 ENCOUNTER — Other Ambulatory Visit: Payer: Self-pay

## 2019-10-28 VITALS — BP 142/100 | HR 79 | Temp 97.9°F | Resp 18 | Ht 68.0 in | Wt 191.4 lb

## 2019-10-28 DIAGNOSIS — E0590 Thyrotoxicosis, unspecified without thyrotoxic crisis or storm: Secondary | ICD-10-CM | POA: Diagnosis not present

## 2019-10-28 DIAGNOSIS — R7989 Other specified abnormal findings of blood chemistry: Secondary | ICD-10-CM | POA: Diagnosis not present

## 2019-10-28 DIAGNOSIS — R002 Palpitations: Secondary | ICD-10-CM

## 2019-10-28 DIAGNOSIS — R030 Elevated blood-pressure reading, without diagnosis of hypertension: Secondary | ICD-10-CM

## 2019-10-28 DIAGNOSIS — F419 Anxiety disorder, unspecified: Secondary | ICD-10-CM

## 2019-10-28 NOTE — Patient Instructions (Signed)
Let me know if you want to start something for anxiety that is a daily medicine - will help brain chemistry   I will watch for your thyroid results in the next couple days and if endo does not treat anything abnormal, we can start a BP med and monitor that while you do your follow up with the cardiologist.  Try to avoid monitoring your vitals for the next week and just relax.    Meds that I think may help (and can be for 3-6 months - doesn't have to be for forever) are medicines like prozac, zoloft, lexapro, etc.

## 2019-10-28 NOTE — Progress Notes (Signed)
Patient ID: Andrea Columbiaaeemah K Pates, female    DOB: [birth year], 49 y.o.   MRN: [id number]  PCP: Doren CustardBoyce, Emily E, FNP  Chief Complaint  Patient presents with  . Hypertension    elevated BP, 150/104 at Doctor today and at home was 175/89    Subjective:   Andrea Pope is a 49 y.o. female, presents to clinic with CC of the following:  HPI  Here for concerns of high BP,  Readings high at home and at other Dr appt as noted above Patient was recently seen for a vague near syncopal episode they were concern for low blood sugar, thyroid labs were abnormal and patient endorsed palpitations and tachycardia so she has been [facility] and cardiology, I have reviewed both of their office visits  She has Dx of white coat HTN on chart. Today BP mildly elevated, it did not improve significantly with letting the patient relax and wait in the room She is having high readings at home She is extremely anxious today and has been anxious monitoring her blood pressure and heart rate at home, is frequently getting alerts from her apple watch that her heart rate is high in "cardiac" zone-this occurs when at rest, upon waking and also with minimal activity like singing songs with kids at her job or walking up the stairs BP Readings from Last 15 Encounters:  10/28/19 (!) 148/100  09/27/19 136/88  09/27/19 (!) 143/95  09/24/19 128/78  09/07/19 136/88  05/06/19 140/75  04/24/19 (!) 176/114  03/26/19 126/80  12/30/18 108/62  11/26/18 134/81  11/23/18 130/84  10/28/18 130/80  06/09/18 122/68  06/01/18 136/74  04/28/18 122/76   Pt denies CP, SOB, exertional sx, LE edema, orthopnea, visual disturbances She denies near syncope, syncope  Main concern is that she feels her heart is often pounding out of her chest, she is having GI symptoms particularly diarrhea after eating she denies sweats, she feels she is lost weight she states is because she avoids eating because of  how it triggers palpitations and diarrhea, weight has not significantly declined in the past 6 months No hair or skin changes She sometimes has headaches  After our last office visit she did present to the ER for generalized weakness and heart palpitations ER work-up showed negative pregnancy test, normal free T4 and TSH, negative D-dimer, troponin, chest x-ray and EKG was normal sinus rhythm with sinus arrhythmia-at a rate in the 80s, likely some respiratory variation   Patient Active Problem List   Diagnosis Date Noted  . 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 Obstructive sleep apnea 12/30/2018  . Absolute anemia 01/09/2018  . Vitamin B12 deficiency 01/08/2018  . Vitamin D deficiency 01/08/2018  . Positive ANA (antinuclear antibody) 12/15/2017  . Primary osteoarthritis of right knee 05/13/2017  . Overweight (BMI 25.0-29.9) 11/19/2016  . White coat hypertension 01/25/2016  . Dysmenorrhea 01/03/2016  . High cholesterol 01/01/2016  . History of ectopic pregnancy 11/29/2015  . Abnormal thyroid stimulating hormone (TSH) level 01/10/2015  . Allergic rhinitis, seasonal 01/10/2015  . Asymptomatic varicose veins of both lower extremities 01/10/2015      Current Outpatient Medications:  .  albuterol (VENTOLIN HFA) 108 (90 Base) MCG/ACT inhaler, Inhale 1-2 puffs into the lungs every 4 (four) hours as needed for wheezing or shortness of breath., Disp: 6.7 g, Rfl: 1 .  ALPRAZolam (XANAX) 0.5 MG tablet, Take 1 tablet (0.5 mg total) by mouth 3 (  three) times daily as needed for anxiety., Disp: 90 tablet, Rfl: 3 .  HALOG 0.1 % external solution, , Disp: , Rfl:  .  VITAMIN D PO, Take by mouth., Disp: , Rfl:    Allergies  Allergen Reactions  . Contrave [Naltrexone-Bupropion Hcl Er]     Palpitations, hypertension     Social History   Tobacco Use  . Smoking status: Never Smoker  . Smokeless tobacco: Never Used  Vaping Use  . Vaping Use: Never used    Substance Use Topics  . Alcohol use: No    Alcohol/week: 0.0 standard drinks  . Drug use: No      Chart Review Today: I have reviewed the patient's medical history in detail and updated the computerized patient record.   Review of Systems 10 Systems reviewed and are negative for acute change except as noted in the HPI.     Objective:   Vitals:   10/28/19 1430  BP: (!) 148/100  Pulse: 79  Resp: 18  Temp: 97.9 F (36.6 C)  TempSrc: Temporal  SpO2: 98%  Weight: 191 lb 6.4 oz (86.8 kg)  Height: 5\' 8"  (1.727 m)    Body mass index is 29.1 kg/m.  Physical Exam Vitals and nursing note reviewed.  Constitutional:      General: She is not in acute distress.    Appearance: Normal appearance. She is well-developed. She is not ill-appearing, toxic-appearing or diaphoretic.     Interventions: Face mask in place.  HENT:     Head: Normocephalic and atraumatic.     Right Ear: External ear normal.     Left Ear: External ear normal.  Eyes:     General: Lids are normal. No scleral icterus.       Right eye: No discharge.        Left eye: No discharge.     Conjunctiva/sclera: Conjunctivae normal.  Neck:     Trachea: Phonation normal. No tracheal deviation.  Cardiovascular:     Rate and Rhythm: Normal rate and regular rhythm.  No extrasystoles are present.    Chest Wall: PMI is not displaced. No thrill.     Pulses: Normal pulses.          Radial pulses are 2+ on the right side and 2+ on the left side.       Posterior tibial pulses are 2+ on the right side and 2+ on the left side.     Heart sounds: Normal heart sounds. No murmur heard.  No friction rub. No gallop.   Pulmonary:     Effort: Pulmonary effort is normal. No respiratory distress.     Breath sounds: Normal breath sounds. No stridor. No wheezing, rhonchi or rales.  Chest:     Chest wall: No tenderness.  Abdominal:     General: Bowel sounds are normal. There is no distension.     Palpations: Abdomen is soft.   Musculoskeletal:     Right lower leg: No edema.     Left lower leg: No edema.  Lymphadenopathy:     Cervical: No cervical adenopathy.  Skin:    General: Skin is warm and dry.     Coloration: Skin is not ashen, cyanotic, jaundiced or pale.     Findings: No rash.     Nails: There is no clubbing.  Neurological:     Mental Status: She is alert. Mental status is at baseline.     Motor: No abnormal muscle tone.     Gait:  Gait normal.  Psychiatric:        Attention and Perception: Attention normal.        Mood and Affect: Mood is anxious.        Speech: Speech normal.        Behavior: Behavior normal. Behavior is cooperative.        Thought Content: Thought content is paranoid. Thought content does not include suicidal ideation. Thought content does not include suicidal plan.        Judgment: Judgment normal.      Results for orders placed or performed during the hospital encounter of 09/27/19  Basic metabolic panel  Result Value Ref Range   Sodium 139 135 - 145 mmol/L   Potassium 3.9 3.5 - 5.1 mmol/L   Chloride 105 98 - 111 mmol/L   CO2 25 22 - 32 mmol/L   Glucose, Bld 121 (H) 70 - 99 mg/dL   BUN 16 6 - 20 mg/dL   Creatinine, Ser 4.96 0.44 - 1.00 mg/dL   Calcium 8.8 (L) 8.9 - 10.3 mg/dL   GFR calc non Af Amer >60 >60 mL/min   GFR calc Af Amer >60 >60 mL/min   Anion gap 9 5 - 15  CBC  Result Value Ref Range   WBC 5.9 4.0 - 10.5 K/uL   RBC 4.51 3.87 - 5.11 MIL/uL   Hemoglobin 13.8 12.0 - 15.0 g/dL   HCT 75.9 36 - 46 %   MCV 90.0 80.0 - 100.0 fL   MCH 30.6 26.0 - 34.0 pg   MCHC 34.0 30.0 - 36.0 g/dL   RDW 16.3 84.6 - 65.9 %   Platelets 383 150 - 400 K/uL   nRBC 0.0 0.0 - 0.2 %  Urinalysis, Complete w Microscopic  Result Value Ref Range   Color, Urine YELLOW (A) YELLOW   APPearance HAZY (A) CLEAR   Specific Gravity, Urine 1.015 1.005 - 1.030   pH 5.0 5.0 - 8.0   Glucose, UA NEGATIVE NEGATIVE mg/dL   Hgb urine dipstick NEGATIVE NEGATIVE   Bilirubin Urine NEGATIVE  NEGATIVE   Ketones, ur NEGATIVE NEGATIVE mg/dL   Protein, ur NEGATIVE NEGATIVE mg/dL   Nitrite NEGATIVE NEGATIVE   Leukocytes,Ua NEGATIVE NEGATIVE   WBC, UA 0-5 0 - 5 WBC/hpf   Bacteria, UA RARE (A) NONE SEEN   Squamous Epithelial / LPF 6-10 0 - 5   Mucus PRESENT   TSH  Result Value Ref Range   TSH 1.099 0.350 - 4.500 uIU/mL  T4, free  Result Value Ref Range   Free T4 0.82 0.61 - 1.12 ng/dL  Fibrin derivatives D-Dimer  Result Value Ref Range   Fibrin derivatives D-dimer ([facility]) 140.96 0.00 - 499.00 ng/mL (FEU)  Pregnancy, urine POC  Result Value Ref Range   Preg Test, Ur NEGATIVE NEGATIVE  Troponin I (High Sensitivity)  Result Value Ref Range   Troponin I (High Sensitivity) 4 <18 ng/L  Troponin I (High Sensitivity)  Result Value Ref Range   Troponin I (High Sensitivity) 3 <18 ng/L       Assessment & Plan:     ICD-10-CM   1. Elevated BP without diagnosis of hypertension  R03.0    pt extremely anxious right now, cardiology workup and endo workup pending - will wait and monitor for now   2. Anxiety  F41.9    pt extremely anxious, cardiac sx causing anxiety? or anxiety causing sx, discussed meds today, daily med may be beneficial, avoid xanax use daily  3.  Palpitations  R00.2    tachycardia?  pounding and forceful heartbeat, causing severe anxiety in pt, cardiology working up  4. Abnormal TSH  R79.89    TSH low and then normal multiple times over the past several years and even last month, with normal T4, per endo, will only tx with abnormal labs    I'm concerned that the patient may be developing more severe anxiety disorder having panic disorder.  She checks her watch frequently, she is checking her blood pressures at home often now with more elevated readings.  She reports having palpitations, tachycardia and forceful heartbeat occurring multiple times per day, it occurs when at rest and driving in her car, she is extremely anxious right now and has felt it, she has heart  rate over 100 when doing songs with her kids at her work or with going up a few stairs at home, she endorses it being present upon waking in the morning.   Her heart rate currently is in the 70s and if the regular rate and rhythm I do not appreciate any palpable thrill the patient states she feels as if her heart is pounding through her chest.  She is seeing cardiology and has a follow-up visit scheduled in the next 2 weeks to review her monitor. She just saw endocrinology this morning TSH has been low noted to be suppressed by PCP in the past, 2016 was low -0.436, from  2017-04/2019 was low normal highest 1.165.  Her recent OV I had checked and it was 0.28 with normal Free T4 - referred to endocrinology for further eval. I reviewed endocrinology office visit from this morning they stated that they will treat the patient with methimazole if thyroid labs are abnormal if they are in normal range patient was advised to follow-up with PCP regarding management of symptoms.  Patient originally coming concern for low blood sugar, she has not been checking her blood sugar, she endorses symptoms occurring when she eats so she has not been eating as much she is not lost any weight, has been stable x6 months, some weight loss over the past year about 9 pounds. Wt Readings from Last 10 Encounters:  10/28/19 191 lb 6.4 oz (86.8 kg)  09/27/19 191 lb (86.6 kg)  09/26/19 190 lb (86.2 kg)  09/24/19 193 lb 6.4 oz (87.7 kg)  09/07/19 191 lb 14.4 oz (87 kg)  07/06/19 188 lb (85.3 kg)  05/26/19 190 lb 3.2 oz (86.3 kg)  04/24/19 185 lb (83.9 kg)  03/26/19 194 lb 6.4 oz (88.2 kg)  12/30/18 200 lb 1.6 oz (90.8 kg)   BMI Readings from Last 5 Encounters:  10/28/19 29.10 kg/m  09/27/19 29.04 kg/m  09/26/19 28.89 kg/m  09/24/19 29.41 kg/m  09/07/19 29.18 kg/m   Regarding her palpitations and tachycardia-she is concerned that her apple watch will alert her that with physical activity her heart rate increases to  100-140.  Reviewed the average heart rate on her apple watch today which shows her daily weekly and monthly heart rate averaged to be in the 60s.    Reviewed flowsheets over several years of office visits she has had some bradycardia with heart rate in the 50s, most office visit her heart rate is in the 60s or 70s.  She may not tolerate a beta-blocker but if there is no abnormal thyroid findings a low-dose extended release beta-blocker may be appropriate and help with her palpitations.  We had extensive discussion today about how her symptoms, alert on  her phone, stress about her health may all be causing worsening anxiety.  She was advised to avoid using Xanax as much as she can.  She states she breaks 0.5 mg Xanax in half that will help calm her down and she reports not doing this daily. I explained that anxiety and depression medicines may help adjust her brain chemistry so she does not feel constantly panicked and on edge.    HTN-her blood pressure is elevated today and has been at home but she is very tense very stressed appearing in office and I prefer to wait rather than start medication right now.  I considered having her check her blood pressures at home but I feel that this may do more harm than good because of her anxiety.  I encouraged her to take off her apple watch and not have any external alerts or tasks that will prompt her to check her vital signs because this has likely worsened her blood pressure and her anxiety.    Did consider amlodipine for treating blood pressure today but I would be worried about any additional reflexive tachycardia  She may tolerate a very low-dose beta-blocker but it does seem that her resting heart rate tends to be low normal  She may need to have a ACE or an ARB started  -I feel this would improve if there is an thyroid pathology that is treated will likely resolve, if there are any concerning cardiac findings would defer medication choices and treatment to  cardiology.  Did explain to the patient that for now I feel like the best plan is to wait and to avoid any further frequent monitoring.  Encouraged her to check her vital signs only if she is feeling like she could pass out or if she has chest pain, diaphoresis, shortness of breath etc.   Did asked the patient to consider starting a medicine that treats anxiety-she may be on it for 3 to 6 months, and if her symptoms were truly severe we could give her Klonopin or Ativan or something slightly more longer acting than Xanax because I truly prefer to avoid Xanax as much as possible, can tend to make anxiety disorders worse  Greater than 50% of this visit was spent in direct face-to-face counseling, obtaining history and physical, discussing and educating pt on treatment plan.  Total time of this visit was 50+ min.  Remainder of time involved but was not limited to reviewing chart (recent and pertinent OV notes and labs), documentation in EMR, and coordinating care and treatment plan.   Danelle Berry, PA-C 10/28/19 2:50 PM

## 2019-10-29 ENCOUNTER — Telehealth: Payer: Self-pay | Admitting: Cardiology

## 2019-10-29 ENCOUNTER — Encounter: Payer: Self-pay | Admitting: Family Medicine

## 2019-10-29 ENCOUNTER — Ambulatory Visit: Payer: Self-pay

## 2019-10-29 DIAGNOSIS — R002 Palpitations: Secondary | ICD-10-CM | POA: Diagnosis not present

## 2019-10-29 MED ORDER — LOSARTAN POTASSIUM 50 MG PO TABS: 50.0000 mg | ORAL_TABLET | Freq: Every day | ORAL | 1 refills | Status: DC

## 2019-10-29 MED ORDER — SERTRALINE HCL 25 MG PO TABS
25.0000 mg | ORAL_TABLET | Freq: Every day | ORAL | 1 refills | Status: DC
Start: 2019-10-29 — End: 2019-12-03

## 2019-10-29 NOTE — Telephone Encounter (Signed)
Pt c/o BP issue: STAT if pt c/o blurred vision, one-sided weakness or slurred speech  1. What are your last 5 BP readings?  Yesterday 150/104, later yesterday 160/100 then went down 158/90 This morning 165/104 at 4:30 am, a little later 148/99  2. Are you having any other symptoms (ex. Dizziness, headache, blurred vision, passed out)?  pateint states she had her thyroid checked and her thyroid was normal.   3. What is your BP issue? elevated

## 2019-10-29 NOTE — Telephone Encounter (Signed)
Pt. Seen in office yesterday with elevated BP. Today she has checked it - 165/100 and 145/100. Feels "weak". No other symptoms. Concerned she needs to be on BP medication. Please advise pt.  Answer Assessment - Initial Assessment Questions 1. BLOOD PRESSURE: "What is the blood pressure?" "Did you take at least two measurements 5 minutes apart?"     165/100 at 0400  And 145/90 2. ONSET: "When did you take your blood pressure?"     Today 3. HOW: "How did you obtain the blood pressure?" (e.g., visiting nurse, automatic home BP monitor)     Home monitor 4. HISTORY: "Do you have a history of high blood pressure?"     No 5. MEDICATIONS: "Are you taking any medications for blood pressure?" "Have you missed any doses recently?"     No meds 6. OTHER SYMPTOMS: "Do you have any symptoms?" (e.g., headache, chest pain, blurred vision, difficulty breathing, weakness)     Weakness 7. PREGNANCY: "Is there any chance you are pregnant?" "When was your last menstrual period?"     No  Protocols used: BLOOD PRESSURE - HIGH-A-AH

## 2019-10-29 NOTE — Telephone Encounter (Signed)
Spoke with patient regarding her elevated BP as charted in previous note. She had spoken with her primary care office today, and also had an appointment with them yesterday. She was prescribed Ativan today, as her care provider thinks anxiety is causing the elevated BP. She did have a ZIO monitor on for two weeks and the results are currently pending. She has a follow up appointment next week with Dr. Azucena Cecil on 11/05/19. I told her I would forward her BP readings to Dr. Azucena Cecil for review, and check in with her early next week.Patient was grateful for the call and agreed with plan.

## 2019-11-01 ENCOUNTER — Ambulatory Visit: Payer: Self-pay | Admitting: *Deleted

## 2019-11-01 NOTE — Telephone Encounter (Signed)
Pt notified that was fine how she had taken, let me know if you disagree and I will let her know otherwise.

## 2019-11-01 NOTE — Telephone Encounter (Signed)
Yes - its perfectly safe to use like this She can use small, as needed doses of her benzos - it will take a few weeks for the zoloft to work.   -LT

## 2019-11-01 NOTE — Telephone Encounter (Signed)
  Pt called in c/o not being able to sleep well last night after taking the Zoloft 25 mg.   It was the first dose she has taken.   She saw Danelle Berry, PA-C on 10/28/2019 for anxiety She was prescribed the losartan and Zoloft.  She took 1/2 a Xanax and that helped her sleep last night.   She wanted to know if it was ok for her to take all 3 medications.   The Losaratn 50 mg that she just started on 10/29/2019 in the mornings, the Zoloft at bedtime and if needed the Xanax 1/2 a tablet when needed for anxiety including at bedtime?  I let her know I would sent a note to Danelle Berry letting her know of pt's concern and someone will get back in touch with her.   She was agreeable to this.  I sent my notes to [facility] for Danelle Berry.   Reason for Disposition . [1] Caller has NON-URGENT medicine question about med that PCP prescribed AND [2] triager unable to answer question    Wants to know if she can take losartan, Zoloft and Xanax together?  Answer Assessment - Initial Assessment Questions 1. NAME of MEDICATION: "What medicine are you calling about?"     Started Losartan yesterday morning for my BP.    I took the first Zoloft last night that she gave me for anxiety.   I didn't sleep very well.   It made me restless so I took 1/2 a Xanax.  That helped me sleep.        2. QUESTION: "What is your question?" (e.g., medication refill, side effect)     Is it ok to take my medications that way?   Use the Xanax if the Zoloft doesn't help. 3. PRESCRIBING HCP: "Who prescribed it?" Reason: if prescribed by specialist, call should be referred to that group.     Leisa Tapia 4. SYMPTOMS: "Do you have any symptoms?"     The Zoloft makes me restless at night. 5. SEVERITY: If symptoms are present, ask "Are they mild, moderate or severe?"     Moderate but the Xanax helps. 6. PREGNANCY:  "Is there any chance that you are pregnant?" "When was your last menstrual period?"     Not asked  Protocols  used: MEDICATION QUESTION CALL-A-AH

## 2019-11-01 NOTE — Telephone Encounter (Signed)
Spoke with patient and she reported that her BP is still running 160 or above (she was not at home to give me exact numbers),  and she is worried about the sustained elevation. She stated that she did not feel like the Ativan was helping her and that her BP is in the 160's when she wakes up in the morning.  I informed her I would inform Dr. Azucena Cecil for any further recommendations.

## 2019-11-02 MED ORDER — LOSARTAN POTASSIUM 50 MG PO TABS
50.0000 mg | ORAL_TABLET | Freq: Two times a day (BID) | ORAL | 6 refills | Status: DC
Start: 2019-11-02 — End: 2019-11-05

## 2019-11-02 NOTE — Telephone Encounter (Signed)
Called and left a VM (per DPR) with patient regarding the following instructions and also  Reiterated the normal result from her Zio monitor that was released to her through MyChart.  Encouraged patient to call back if she had any further questions, otherwise we would follow up with her this Friday 7/2 at her scheduled appointment.  Debbe Odea, MD  You 15 hours ago (6:07 PM)   Okay for patient to increase current losartan dose to 50 mg twice daily. Continue BP checks at home. Thank you   Routing comment

## 2019-11-03 ENCOUNTER — Telehealth: Payer: Self-pay | Admitting: Cardiology

## 2019-11-03 NOTE — Telephone Encounter (Addendum)
Spoke with the patient. Patient sts that she started her increased does of Losartan 50 mg bid yesterday. Today she developed lightheadedness. Patient sts that the lightheadedness is not associated with positional change and that she has been lightheaded all through the day. She denies any other symptoms.  She provided todays BP reading below. Adv the patient that the reading are elevated and should not be causing her symptoms. Adv her that I di d not think the losartan was the cause of her lightheadedness. Adv the patient that I will fwd the update to Dr. Azucena Cecil and that we will call back with his recommendation.

## 2019-11-03 NOTE — Telephone Encounter (Signed)
Pt c/o medication issue:  1. Name of Medication: losartan 50 mg po BID   2. How are you currently taking this medication (dosage and times per day)? Just increased to BID yesterday   3. Are you having a reaction (difficulty breathing--STAT)?  Lightheadedness   4. What is your medication issue?   BP today  Am  139/96 Pm  165/92  159/97

## 2019-11-04 NOTE — Telephone Encounter (Signed)
Called patient and relayed Dr. Merita Norton recommendation as follows:  "Agree with assessment. Continue medication as prescribed. Keep follow-up appointment. Thank you "  Patient stated that she thinks it was the Zoloft causing the dizziness, as she skipped that dose and is feeling better today. She was still concerned with her BP as she had just got home from work and took it, and it was 179/82. I advised her to continue the Losartan BID and to bring in her recorded BP's tomorrow morning to her scheduled follow up with Dr. Azucena Cecil.  Patient agreed with plan and was grateful for the call back.

## 2019-11-05 ENCOUNTER — Other Ambulatory Visit: Payer: Self-pay

## 2019-11-05 ENCOUNTER — Encounter: Payer: Self-pay | Admitting: Cardiology

## 2019-11-05 ENCOUNTER — Ambulatory Visit: Payer: BC Managed Care – PPO | Admitting: Cardiology

## 2019-11-05 VITALS — BP 142/90 | HR 57 | Ht 68.0 in | Wt 187.1 lb

## 2019-11-05 DIAGNOSIS — I10 Essential (primary) hypertension: Secondary | ICD-10-CM | POA: Diagnosis not present

## 2019-11-05 DIAGNOSIS — R002 Palpitations: Secondary | ICD-10-CM | POA: Diagnosis not present

## 2019-11-05 DIAGNOSIS — Z8659 Personal history of other mental and behavioral disorders: Secondary | ICD-10-CM | POA: Diagnosis not present

## 2019-11-05 MED ORDER — HYDROCHLOROTHIAZIDE 25 MG PO TABS
25.0000 mg | ORAL_TABLET | Freq: Every day | ORAL | 6 refills | Status: DC
Start: 2019-11-05 — End: 2019-12-06

## 2019-11-05 MED ORDER — LOSARTAN POTASSIUM 100 MG PO TABS
100.0000 mg | ORAL_TABLET | Freq: Every day | ORAL | 6 refills | Status: DC
Start: 2019-11-05 — End: 2020-05-01

## 2019-11-05 NOTE — Progress Notes (Signed)
Cardiology Office Note:    Date:  11/05/2019   ID:  Andrea Pope, DOB Mar 25, 1971, MRN [id number]  PCP:  Andrea Berry, PA-C  Cardiologist:  Andrea Odea, MD  Electrophysiologist:  None   Referring MD: Andrea Custard, FNP   Chief Complaint  Patient presents with  . office visit    Pt is concerned w/ BP elevated. Meds verbally reviewed w/ pt.    History of Present Illness:    Andrea Pope is a 49 y.o. female with history of hypertension, anxiety who presents for follow-up.  Patient was last seen due to palpitations for 3 weeks.  Symptoms are associated with shortness of breath and dizziness.  Cardiac monitor was placed for 2 weeks to evaluate any significant arrhythmias.  Denies caffeinated products.  Blood pressures were noted to be elevated and patient was started on losartan.  Her home blood pressure readings have been elevated with systolic in the 170s.  Patient had some dizziness which she attributes to skipping her dose of Zoloft.  Symptoms have improved since taking Zoloft.  Previous work-up showed normal TSH T4.   Past Medical History:  Diagnosis Date  . Abnormal thyroid stimulating hormone level   . Allergy   . Anemia    iron deficiency  . Encounter for initial prescription of injectable contraceptive   . Female stress incontinence   . Gallstones 12/05/2015  . High cholesterol   . History of ectopic pregnancy 11/29/2015  . Hypertension   . Menorrhagia with irregular cycle   . Neutropenia (HCC) 07/23/2016  . Ovarian cyst 11/29/2015  . Ovarian cyst 11/29/2015  . 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 Proteinuria 08/14/2015  . Spider veins of both lower extremities   . 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  CHOLECYSTECTOMY N/A 01/19/2016   Procedure: LAPAROSCOPIC CHOLECYSTECTOMY WITH INTRAOPERATIVE CHOLANGIOGRAM;  Surgeon: Kieth Brightly, MD;  Location: [facility] ORS;  Service: General;  Laterality: N/A;  . DILITATION & CURRETTAGE/HYSTROSCOPY WITH NOVASURE ABLATION N/A 03/24/2017   Procedure: DILATATION & CURETTAGE/HYSTEROSCOPY WITH NOVASURE ABLATION;  Surgeon: Herold Harms, MD;  Location: [facility] ORS;  Service: Gynecology;  Laterality: N/A;  . laser surgery on eye Right 10/2016    Current Medications: Current Meds  Medication Sig  . albuterol (VENTOLIN HFA) 108 (90 Base) MCG/ACT inhaler Inhale 1-2 puffs into the lungs every 4 (four) hours as needed for wheezing or shortness of breath.  . ALPRAZolam (XANAX) 0.5 MG tablet Take 1 tablet (0.5 mg total) by mouth 3 (three) times daily as needed for anxiety.  Marland Kitchen HALOG 0.1 % external solution   . losartan (COZAAR) 100 MG tablet Take 1 tablet (100 mg total) by mouth daily.  . sertraline (ZOLOFT) 25 MG tablet Take 1 tablet (25 mg total) by mouth at bedtime.  Marland Kitchen VITAMIN D PO Take by mouth.  . [DISCONTINUED] losartan (COZAAR) 50 MG tablet Take 1 tablet (50 mg total) by mouth in the morning and at bedtime.     Allergies:   Contrave [naltrexone-bupropion hcl er]   Social History   Socioeconomic History  . Marital status: Married    Spouse name: Andrea Pope   . Number of children: 4  . Years of education: Not on file  . Highest education level: Bachelor's degree (e.g., BA, AB, BS)  Occupational History  .  Occupation: Runner, broadcasting/film/video  Tobacco Use  . Smoking status: Never Smoker  . Smokeless tobacco: Never Used  Vaping Use  . Vaping Use: Never used  Substance and Sexual Activity  . Alcohol use: No    Alcohol/week: 0.0 standard drinks  . Drug use: No  . Sexual activity: Yes    Partners: Male    Birth control/protection: None  Other Topics Concern  . Not on file  Social History Narrative  . Not on file   Social Determinants of Health   Financial  Resource Strain:   . Difficulty of Paying Living Expenses:   Food Insecurity:   . Worried About Programme researcher, broadcasting/film/video in the Last Year:   . Barista in the Last Year:   Transportation Needs:   . Freight forwarder (Medical):   Marland Kitchen Lack of Transportation (Non-Medical):   Physical Activity:   . Days of Exercise per Week:   . Minutes of Exercise per Session:   Stress:   . Feeling of Stress :   Social Connections:   . Frequency of Communication with Friends and Family:   . Frequency of Social Gatherings with Friends and Family:   . Attends Religious Services:   . Active Member of Clubs or Organizations:   . Attends Banker Meetings:   Marland Kitchen Marital Status:      Family History: The patient's family history includes Cancer in her father, paternal grandfather, and paternal uncle; Diabetes in her brother, maternal grandmother, sister, and another family member; Hypertension in her father and mother; Kidney disease in her mother; Schizophrenia in her sister; Thyroid disease in her mother. There is no history of Breast cancer, Ovarian cancer, or Heart disease.  ROS:   Please see the history of present illness.     All other systems reviewed and are negative.  EKGs/Labs/Other Studies Reviewed:    The following studies were reviewed today:   EKG:  EKG is  ordered today.  The ekg ordered today demonstrates sinus brady, normal ECG.  Recent Labs: 09/07/2019: ALT 8 09/26/2019: BUN 16; Creatinine, Ser 0.87; Hemoglobin 13.8; Platelets 383; Potassium 3.9; Sodium 139 09/27/2019: TSH 1.099  Recent Lipid Panel    Component Value Date/Time   CHOL 188 12/30/2018 0000   TRIG 144 12/30/2018 0000   HDL 61 12/30/2018 0000   CHOLHDL 3.1 12/30/2018 0000   VLDL 21 11/19/2016 1513   LDLCALC 103 (H) 12/30/2018 0000    Physical Exam:    VS:  BP (!) 142/90 (BP Location: Left Arm, Patient Position: Sitting, Cuff Size: Normal)   Pulse (!) 57   Ht 5\' 8"  (1.727 m)   Wt 187 lb 2 oz (84.9  kg)   SpO2 98%   BMI 28.45 kg/m     Wt Readings from Last 3 Encounters:  11/05/19 187 lb 2 oz (84.9 kg)  10/28/19 191 lb 6.4 oz (86.8 kg)  09/27/19 191 lb (86.6 kg)     GEN:  Well nourished, well developed in no acute distress HEENT: Normal NECK: No JVD; No carotid bruits LYMPHATICS: No lymphadenopathy CARDIAC: RRR, no murmurs, rubs, gallops RESPIRATORY:  Clear to auscultation without rales, wheezing or rhonchi  ABDOMEN: Soft, non-tender, non-distended MUSCULOSKELETAL:  No edema; No deformity  SKIN: Warm and dry NEUROLOGIC:  Alert and oriented x 3 PSYCHIATRIC:  Normal affect   ASSESSMENT:    1. Palpitations   2. Essential hypertension   3. Hx of anxiety disorder    PLAN:  In order of problems listed above:  1. Patient with 3-week history of palpitations.  2-week cardiac monitor did not show any significant arrhythmias.  Patient triggered events were associated with sinus rhythm.  Overall benign cardiac monitor.  Patient reassured. 2. Patient with elevated blood pressure readings at home.  Unsure if symptoms of anxiety was contributing to blood pressure measurements.  Blood pressure is elevated today.  Start HCTZ 25 mg daily.  Consolidate losartan to 100 mg daily.  Patient encouraged to check blood pressure when calm and relaxed. 3. Recommend she follows up with primary care provider regarding anxiety issues and management.  Follow-up in 1 month.  This note was generated in part or whole with voice recognition software. Voice recognition is usually quite accurate but there are transcription errors that can and very often do occur. I apologize for any typographical errors that were not detected and corrected.  Medication Adjustments/Labs and Tests Ordered: Current medicines are reviewed at length with the patient today.  Concerns regarding medicines are outlined above.  Orders Placed This Encounter  Procedures  . EKG 12-Lead   Meds ordered this encounter  Medications    . hydrochlorothiazide (HYDRODIURIL) 25 MG tablet    Sig: Take 1 tablet (25 mg total) by mouth daily.    Dispense:  30 tablet    Refill:  6  . losartan (COZAAR) 100 MG tablet    Sig: Take 1 tablet (100 mg total) by mouth daily.    Dispense:  30 tablet    Refill:  6    Patient Instructions  Medication Instructions:   Your physician has recommended you make the following change in your medication:   1.  START Hydrochlorthiazide: Take 1 tablet (25 mg total) by mouth daily. 2.  CHANGE your Losartan:  Take 1 tablet (100 mg total) by mouth daily.  *If you need a refill on your cardiac medications before your next appointment, please call your pharmacy*   Lab Work: None Ordered If you have labs (blood work) drawn today and your tests are completely normal, you will receive your results only by: Marland Kitchen MyChart Message (if you have MyChart) OR . A paper copy in the mail If you have any lab test that is abnormal or we need to change your treatment, we will call you to review the results.   Testing/Procedures: None Ordered   Follow-Up: At [facility], you and your health needs are our priority.  As part of our continuing mission to provide you with exceptional heart care, we have created designated Provider Care Teams.  These Care Teams include your primary Cardiologist (physician) and Advanced Practice Providers (APPs -  Physician Assistants and Nurse Practitioners) who all work together to provide you with the care you need, when you need it.  We recommend signing up for the patient portal called "MyChart".  Sign up information is provided on this After Visit Summary.  MyChart is used to connect with patients for Virtual Visits (Telemedicine).  Patients are able to view lab/test results, encounter notes, upcoming appointments, etc.  Non-urgent messages can be sent to your provider as well.   To learn more about what you can do with MyChart, go to [email or web address].    Your next  appointment:   1 month(s)  The format for your next appointment:   In Person  Provider:   Debbe Odea, MD   Other Instructions  N/A     Signed, Andrea Odea, MD  11/05/2019 12:33 PM  [facility]

## 2019-11-05 NOTE — Patient Instructions (Signed)
Medication Instructions:   Your physician has recommended you make the following change in your medication:   1.  START Hydrochlorthiazide: Take 1 tablet (25 mg total) by mouth daily. 2.  CHANGE your Losartan:  Take 1 tablet (100 mg total) by mouth daily.  *If you need a refill on your cardiac medications before your next appointment, please call your pharmacy*   Lab Work: None Ordered If you have labs (blood work) drawn today and your tests are completely normal, you will receive your results only by:  MyChart Message (if you have MyChart) OR  A paper copy in the mail If you have any lab test that is abnormal or we need to change your treatment, we will call you to review the results.   Testing/Procedures: None Ordered   Follow-Up: At [facility], you and your health needs are our priority.  As part of our continuing mission to provide you with exceptional heart care, we have created designated Provider Care Teams.  These Care Teams include your primary Cardiologist (physician) and Advanced Practice Providers (APPs -  Physician Assistants and Nurse Practitioners) who all work together to provide you with the care you need, when you need it.  We recommend signing up for the patient portal called "MyChart".  Sign up information is provided on this After Visit Summary.  MyChart is used to connect with patients for Virtual Visits (Telemedicine).  Patients are able to view lab/test results, encounter notes, upcoming appointments, etc.  Non-urgent messages can be sent to your provider as well.   To learn more about what you can do with MyChart, go to [email or web address].    Your next appointment:   1 month(s)  The format for your next appointment:   In Person  Provider:   Debbe Odea, MD   Other Instructions  N/A

## 2019-11-09 ENCOUNTER — Ambulatory Visit: Payer: BC Managed Care – PPO

## 2019-11-09 DIAGNOSIS — Z03818 Encounter for observation for suspected exposure to other biological agents ruled out: Secondary | ICD-10-CM | POA: Diagnosis not present

## 2019-11-09 DIAGNOSIS — Z1159 Encounter for screening for other viral diseases: Secondary | ICD-10-CM | POA: Diagnosis not present

## 2019-11-10 ENCOUNTER — Ambulatory Visit
Admission: EM | Admit: 2019-11-10 | Discharge: 2019-11-10 | Disposition: A | Discharge status: Home / Self Care | Payer: BC Managed Care – PPO | Attending: Emergency Medicine | Admitting: Emergency Medicine

## 2019-11-10 ENCOUNTER — Emergency Department
Admission: EM | Admit: 2019-11-10 | Discharge: 2019-11-10 | Disposition: A | Discharge status: Home / Self Care | Payer: BC Managed Care – PPO | Attending: Emergency Medicine | Admitting: Emergency Medicine

## 2019-11-10 ENCOUNTER — Telehealth: Payer: Self-pay | Admitting: Pulmonary Disease

## 2019-11-10 ENCOUNTER — Emergency Department: Payer: BC Managed Care – PPO

## 2019-11-10 ENCOUNTER — Other Ambulatory Visit: Payer: Self-pay

## 2019-11-10 DIAGNOSIS — R079 Chest pain, unspecified: Secondary | ICD-10-CM | POA: Insufficient documentation

## 2019-11-10 DIAGNOSIS — I10 Essential (primary) hypertension: Secondary | ICD-10-CM | POA: Diagnosis not present

## 2019-11-10 DIAGNOSIS — G4733 Obstructive sleep apnea (adult) (pediatric): Secondary | ICD-10-CM

## 2019-11-10 DIAGNOSIS — Z79899 Other long term (current) drug therapy: Secondary | ICD-10-CM | POA: Diagnosis not present

## 2019-11-10 DIAGNOSIS — K2900 Acute gastritis without bleeding: Secondary | ICD-10-CM | POA: Diagnosis not present

## 2019-11-10 DIAGNOSIS — Z7951 Long term (current) use of inhaled steroids: Secondary | ICD-10-CM | POA: Insufficient documentation

## 2019-11-10 DIAGNOSIS — R1013 Epigastric pain: Secondary | ICD-10-CM | POA: Diagnosis not present

## 2019-11-10 LAB — CBC
HCT: 42.5 % (ref 36.0–46.0)
Hemoglobin: 14.3 g/dL (ref 12.0–15.0)
MCH: 30.8 pg (ref 26.0–34.0)
MCHC: 33.6 g/dL (ref 30.0–36.0)
MCV: 91.4 fL (ref 80.0–100.0)
Platelets: 347 10*3/uL (ref 150–400)
RBC: 4.65 MIL/uL (ref 3.87–5.11)
RDW: 11.9 % (ref 11.5–15.5)
WBC: 4.4 10*3/uL (ref 4.0–10.5)
nRBC: 0 % (ref 0.0–0.2)

## 2019-11-10 LAB — TROPONIN I (HIGH SENSITIVITY)
Troponin I (High Sensitivity): 4 ng/L (ref <18)
Troponin I (High Sensitivity): 5 ng/L (ref <18)

## 2019-11-10 LAB — BASIC METABOLIC PANEL
Anion gap: 11 (ref 5–15)
BUN: 13 mg/dL (ref 6–20)
CO2: 29 mmol/L (ref 22–32)
Calcium: 9.2 mg/dL (ref 8.9–10.3)
Chloride: 98 mmol/L (ref 98–111)
Creatinine, Ser: 1 mg/dL (ref 0.44–1.00)
GFR calc Af Amer: >60 mL/min (ref >60)
GFR calc non Af Amer: >60 mL/min (ref >60)
Glucose, Bld: 89 mg/dL (ref 70–99)
Potassium: 3.1 mmol/L — ABNORMAL LOW (ref 3.5–5.1)
Sodium: 138 mmol/L (ref 135–145)

## 2019-11-10 MED ORDER — SUCRALFATE 1 G PO TABS
1.0000 g | ORAL_TABLET | Freq: Four times a day (QID) | ORAL | 0 refills | Status: DC
Start: 2019-11-10 — End: 2019-12-03

## 2019-11-10 MED ORDER — ALUM & MAG HYDROXIDE-SIMETH 200-200-20 MG/5ML PO SUSP
30.0000 mL | Freq: Once | ORAL | Status: AC
  Administered 2019-11-10: 30 mL via ORAL
  Filled 2019-11-10: qty 30

## 2019-11-10 MED ORDER — LIDOCAINE VISCOUS HCL 2 % MT SOLN
15.0000 mL | Freq: Once | OROMUCOSAL | Status: AC
  Administered 2019-11-10: 15 mL via ORAL
  Filled 2019-11-10: qty 15

## 2019-11-10 MED ORDER — SODIUM CHLORIDE 0.9% FLUSH: 3.0000 mL | Freq: Once | INTRAVENOUS | Status: DC

## 2019-11-10 NOTE — ED Provider Notes (Signed)
[facility] Emergency Department Provider Note   ____________________________________________    I have reviewed the triage vital signs and the nursing notes.   HISTORY  Chief Complaint Chest Pain     HPI Andrea Pope is a 49 y.o. female presents with complaints of epigastric abdominal discomfort.  She feels as though something is stuck in there and has been for several days.  She does not actually think anything is stuck she is describing how it feels.  She was started on Protonix 3 days ago with little improvement, she has frequent belching as well.  She did go to urgent care and they recommended she come to the emergency department for cardiac evaluation as well.  She denies shortness of breath.  No pleurisy.  No fevers or chills.  Past Medical History:  Diagnosis Date  . Abnormal thyroid stimulating hormone level   . Allergy   . Anemia    iron deficiency  . Encounter for initial prescription of injectable contraceptive   . Female stress incontinence   . Gallstones 12/05/2015  . High cholesterol   . History of ectopic pregnancy 11/29/2015  . Hypertension   . Menorrhagia with irregular cycle   . Neutropenia (HCC) 07/23/2016  . Ovarian cyst 11/29/2015  . Ovarian cyst 11/29/2015  . 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 Proteinuria 08/14/2015  . Spider veins of both lower extremities   . 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 Obstructive sleep apnea 12/30/2018  . Absolute anemia 01/09/2018  . Vitamin B12 deficiency 01/08/2018  . Vitamin D deficiency 01/08/2018  . Positive ANA (antinuclear antibody) 12/15/2017  . Primary  osteoarthritis of right knee 05/13/2017  . Overweight (BMI 25.0-29.9) 11/19/2016  . White coat hypertension 01/25/2016  . Dysmenorrhea 01/03/2016  . High cholesterol 01/01/2016  . History of ectopic pregnancy 11/29/2015  . Abnormal thyroid stimulating hormone (TSH) level 01/10/2015  . Allergic rhinitis, seasonal 01/10/2015  . Asymptomatic varicose veins of both lower extremities 01/10/2015    Past Surgical History:  Procedure Laterality Date  . CHOLECYSTECTOMY N/A 01/19/2016   Procedure: LAPAROSCOPIC CHOLECYSTECTOMY WITH INTRAOPERATIVE CHOLANGIOGRAM;  Surgeon: Kieth Brightly, MD;  Location: [facility] ORS;  Service: General;  Laterality: N/A;  . DILITATION & CURRETTAGE/HYSTROSCOPY WITH NOVASURE ABLATION N/A 03/24/2017   Procedure: DILATATION & CURETTAGE/HYSTEROSCOPY WITH NOVASURE ABLATION;  Surgeon: Herold Harms, MD;  Location: [facility] ORS;  Service: Gynecology;  Laterality: N/A;  . laser surgery on eye Right 10/2016    Prior to Admission medications   Medication Sig Start Date End Date Taking? Authorizing Provider  albuterol (VENTOLIN HFA) 108 (90 Base) MCG/ACT inhaler Inhale 1-2 puffs into the lungs every 4 (four) hours as needed for wheezing or shortness of breath. 07/06/19   Jamelle Haring, MD  ALPRAZolam Prudy Feeler) 0.5 MG tablet Take 1 tablet (0.5 mg total) by mouth 3 (three) times daily as needed for anxiety. 09/27/19   Virl Diamond A, MD  HALOG 0.1 % external solution  06/23/19   [provider]  hydrochlorothiazide (HYDRODIURIL) 25 MG tablet Take 1 tablet (25 mg total) by mouth daily. 11/05/19 02/03/20  Debbe Odea, MD  losartan (COZAAR) 100 MG tablet Take 1 tablet (100 mg total) by mouth daily. 11/05/19 02/03/20  Debbe Odea, MD  sertraline (ZOLOFT) 25 MG tablet Take 1 tablet (25 mg total) by mouth at bedtime. 10/29/19   Danelle Berry, PA-C  sucralfate (CARAFATE) 1 g tablet Take 1 tablet (1 g total) by mouth 4 (four) times daily for 15 days. 11/10/19  11/25/19  Jene Every, MD  VITAMIN D PO Take by mouth.    [provider]     Allergies Contrave [naltrexone-bupropion hcl er]  Family History  Problem Relation Age of Onset  . Hypertension Mother   . Thyroid disease Mother   . Kidney disease Mother   . Hypertension Father   . Cancer Father        Prostate  . Diabetes Brother   . Diabetes Other   . Cancer Paternal Uncle        Prostate  . Diabetes Maternal Grandmother   . Cancer Paternal Grandfather        Prostate  . Diabetes Sister   . Schizophrenia Sister   . Breast cancer Neg Hx   . Ovarian cancer Neg Hx   . Heart disease Neg Hx     Social History Social History   Tobacco Use  . Smoking status: Never Smoker  . Smokeless tobacco: Never Used  Vaping Use  . Vaping Use: Never used  Substance Use Topics  . Alcohol use: No    Alcohol/week: 0.0 standard drinks  . Drug use: No    Review of Systems  Constitutional: No fever/chills Eyes: No visual changes.  ENT: No sore throat. Cardiovascular: As above Respiratory: As above Gastrointestinal: As above Genitourinary: Negative for dysuria. Musculoskeletal: Negative for back pain. Skin: Negative for rash. Neurological: Negative for headaches or weakness   ____________________________________________   PHYSICAL EXAM:  VITAL SIGNS: ED Triage Vitals  Enc Vitals Group     BP 11/10/19 1637 (!) 148/95     Pulse Rate 11/10/19 1637 69     Resp 11/10/19 1637 18     Temp 11/10/19 1637 98.7 F (37.1 C)     Temp Source 11/10/19 1637 Oral     SpO2 11/10/19 1637 100 %     Weight 11/10/19 1656 83.9 kg (185 lb)     Height 11/10/19 1656 1.727 m (5\' 8" )     Head Circumference --      Peak Flow --      Pain Score 11/10/19 1655 0     Pain Loc --      Pain Edu? --      Excl. in GC? --     Constitutional: Alert and oriented.  Nose: No congestion/rhinnorhea. Mouth/Throat: Mucous membranes are moist.    Cardiovascular: Normal rate, regular rhythm.  Grossly normal heart sounds.  Good peripheral circulation. Respiratory: Normal respiratory effort.  No retractions. Lungs CTAB. Gastrointestinal: Soft and nontender. No distention.  No CVA tenderness.  Musculoskeletal: Warm and well perfused Neurologic:  Normal speech and language. No gross focal neurologic deficits are appreciated.  Skin:  Skin is warm, dry and intact. No rash noted. Psychiatric: Mood and affect are normal. Speech and behavior are normal.  ____________________________________________   LABS (all labs ordered are listed, but only abnormal results are displayed)  Labs Reviewed  BASIC METABOLIC PANEL - Abnormal; Notable for the following components:      Result Value   Potassium 3.1 (*)    All other components within normal limits  CBC  POC URINE PREG, ED  TROPONIN I (HIGH SENSITIVITY)  TROPONIN I (HIGH SENSITIVITY)   ____________________________________________  EKG   ____________________________________________  RADIOLOGY  Chest x-ray reviewed by me, unremarkable ____________________________________________   PROCEDURES  Procedure(s) performed: No  Procedures   Critical Care performed: No ____________________________________________   INITIAL IMPRESSION / ASSESSMENT AND PLAN / ED COURSE  Pertinent labs & imaging results that were available during my care of the patient were reviewed by me and considered in my medical decision making (see chart for details).  Patient well-appearing in no acute distress.  Mild discomfort in the epigastrium however no tenderness.  Lab work today is quite reassuring, troponin is normal.  CBC is normal, BMP demonstrates very mild hypokalemia.  No recent alcohol, non-smoker.  Suspect gastritis/PUD, will trial GI cocktail.  Patient with significant improvement after GI cocktail.  She did start Protonix recently, will add on Carafate and refer to GI as an outpatient.    ____________________________________________   FINAL CLINICAL IMPRESSION(S) / ED DIAGNOSES  Final diagnoses:  Acute gastritis without hemorrhage, unspecified gastritis type        Note:  This document was prepared using Dragon voice recognition software and may include unintentional dictation errors.   Jene Every, MD 11/10/19 2039

## 2019-11-10 NOTE — Telephone Encounter (Signed)
Called and left message for patient that Dr. Wynona Neat has ordered a change to her pressure settings. Changed from 5-20 to 5-10. Order sent to DME.

## 2019-11-10 NOTE — Discharge Instructions (Signed)
Go to the emergency department if you have acute chest pain, shortness of breath, or other concerning symptoms.    Follow-up with your primary care provider tomorrow.

## 2019-11-10 NOTE — ED Triage Notes (Signed)
Patient reports she has been belching a lot more than normal and "feels like there is something stuck in my chest". Endorses right arm numbness yesterday. Reports she recently stopped using her CPAP.   Denies: headaches and dizziness OTC: gas relief, pantoprazole provided by her PCP.

## 2019-11-10 NOTE — Progress Notes (Deleted)
Patient is a 49 year old female patient of Danelle Berry She presents today after she was seen in urgent care yesterday with CP concerns and A/P noted:   Patient presents with midsternal chest pain, SOB, nausea, and increased belching x4 days.  She was recently started on pantoprazole for her symptoms.  She states her right arm was numb yesterday but this has resolved.  She has history of palpitations and was seen by her PCP on 09/27/2019 and by cardiology on 11/05/2019.  Her history is significant for hypertension, OSA, and anxiety.  She denies focal weakness, dizziness, facial asymmetry, cough, lower extremity edema, emesis, or other symptoms.  Patient states she has not taken her blood pressure medication today and is not using her CPAP. Chest pain.  Discussed limitations of urgent care versus ED with patient at length.  Instructed her to go to the emergency department if she has acute chest pain, shortness of breath, or other concerning symptoms.  EKG shows sinus rhythm, rate 63, 1 mm ST elevation in V2 which was present on her previous EKG done on 11/05/2019.  Instructed her to follow-up with her PCP tomorrow.  Patient agrees to plan of care.    She contacted pulmonary-patient calling with complaint of belching, chest pain, nausea. She is asking if it could be related to CPAP because it is more frequent in the first part of the day. She reports the chest pain is sharp. .  Pulmonary did note that the pressures in CPAP can contribute, recommended a change from 5-20 to 5-10, and also noted that compliance was poor with his CPAP, and recommended she use it more frequently to help them assess on another download in the next couple weeks  She called [facility] and they prescribed pantoprazole and Gas X. Belching relieved by gingerale, chest pain in middle of chest Her most recent visit with cardiology was on 11/05/2019 with the following assessment/plan noted and a 1 month follow-up recommended:  1. Patient with  3-week history of palpitations.  2-week cardiac monitor did not show any significant arrhythmias.  Patient triggered events were associated with sinus rhythm.  Overall benign cardiac monitor.  Patient reassured. 2. Patient with elevated blood pressure readings at home.  Unsure if symptoms of anxiety was contributing to blood pressure measurements.  Blood pressure is elevated today.  Start HCTZ 25 mg daily.  Consolidate losartan to 100 mg daily.  Patient encouraged to check blood pressure when calm and relaxed. 3. Recommend she follows up with primary care provider regarding anxiety issues and management.  Her last visit with Lavada Mesi was 10/28/2019, with an extensive and lengthy assessment/plan noted and reviewed. Basically concerns with anxiety contributing to her palpitations and elevated blood pressures, with an abnormal TSH noted several times in her past and endocrine consulted, last TSH in May was normal.  She was seen in the emergency department 09/27/2019 for generalized weakness and heart palpitations ER work-up showed negative pregnancy test, normal free T4 and TSH, negative D-dimer, troponin, chest x-ray and EKG was normal sinus rhythm with sinus arrhythmia-at a rate in the 80s, likely some respiratory variation

## 2019-11-10 NOTE — ED Triage Notes (Signed)
Pt arrives from urgent care for reports of heart burn and chest pain x 3 days. Pt started taking protonix on Monday but she has not experienced any relief. Pt reports increased burping as well. Pt in NAD, no shob, skin warm and dry

## 2019-11-10 NOTE — Telephone Encounter (Signed)
Addressed on a different thread

## 2019-11-10 NOTE — Telephone Encounter (Signed)
Dr. Olalere - please advise. Thanks. 

## 2019-11-10 NOTE — Telephone Encounter (Signed)
CPAP download reviewed showing residual AHI 1.9  Compliance is poor, wonder if this may be related to ongoing complaints  Will reduce pressures from 5-20 to 5-10  Encourage patient to use CPAP more regularly for [place] to get another download in a few weeks to see if this is making any changes or if any other intervention needs to be explored  DME referral to make changes to machine

## 2019-11-10 NOTE — ED Notes (Signed)
Pt c/o epigastric pain/pressure, states it feels like something is stuck there but is able to eat and drink without issue.states she been having a lot of belching, some dizziness. States she just started taking protonix but has not had any relief yet.

## 2019-11-10 NOTE — Telephone Encounter (Signed)
Yes.  This can be related to CPAP use Sometimes may be related to the pressures and pressure may need to be changed In some people, switching to a BiPAP may be the only option-unfortunately this will involve going to the sleep lab to get titrated to a BiPAP  Sleeping with the head of the bed elevated may help  Let us get a download from the machine -Once we review the download we may be able to change the pressures as this may be occasionally associated with higher pressures

## 2019-11-10 NOTE — ED Provider Notes (Signed)
Andrea Pope    CSN: [id number] Arrival date & time: 11/10/19  1502      History   Chief Complaint Chief Complaint  Patient presents with  . Gastroesophageal Reflux  . belching    HPI Andrea Pope is a 49 y.o. female.   Patient presents with midsternal chest pain, SOB, nausea, and increased belching x4 days.  She was recently started on pantoprazole for her symptoms.  She states her right arm was numb yesterday but this has resolved.  She has history of palpitations and was seen by her PCP on 09/27/2019 and by cardiology on 11/05/2019.  Her history is significant for hypertension, OSA, and anxiety.  She denies focal weakness, dizziness, facial asymmetry, cough, lower extremity edema, emesis, or other symptoms.  Patient states she has not taken her blood pressure medication today and is not using her CPAP.  The history is provided by the patient.    Past Medical History:  Diagnosis Date  . Abnormal thyroid stimulating hormone level   . Allergy   . Anemia    iron deficiency  . Encounter for initial prescription of injectable contraceptive   . Female stress incontinence   . Gallstones 12/05/2015  . High cholesterol   . History of ectopic pregnancy 11/29/2015  . Hypertension   . Menorrhagia with irregular cycle   . Neutropenia (HCC) 07/23/2016  . Ovarian cyst 11/29/2015  . Ovarian cyst 11/29/2015  . 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 Proteinuria 08/14/2015  . Spider veins of both lower extremities   . 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 Obstructive sleep apnea 12/30/2018  . Absolute anemia 01/09/2018  . Vitamin B12 deficiency 01/08/2018  .  Vitamin D deficiency 01/08/2018  . Positive ANA (antinuclear antibody) 12/15/2017  . Primary osteoarthritis of right knee 05/13/2017  . Overweight (BMI 25.0-29.9) 11/19/2016  . White coat hypertension 01/25/2016  . Dysmenorrhea 01/03/2016  . High cholesterol 01/01/2016  . History of ectopic pregnancy 11/29/2015  . Abnormal thyroid stimulating hormone (TSH) level 01/10/2015  . Allergic rhinitis, seasonal 01/10/2015  . Asymptomatic varicose veins of both lower extremities 01/10/2015    Past Surgical History:  Procedure Laterality Date  . CHOLECYSTECTOMY N/A 01/19/2016   Procedure: LAPAROSCOPIC CHOLECYSTECTOMY WITH INTRAOPERATIVE CHOLANGIOGRAM;  Surgeon: Kieth Brightly, MD;  Location: [facility] ORS;  Service: General;  Laterality: N/A;  . DILITATION & CURRETTAGE/HYSTROSCOPY WITH NOVASURE ABLATION N/A 03/24/2017   Procedure: DILATATION & CURETTAGE/HYSTEROSCOPY WITH NOVASURE ABLATION;  Surgeon: Herold Harms, MD;  Location: [facility] ORS;  Service: Gynecology;  Laterality: N/A;  . laser surgery on eye Right 10/2016    OB History    Gravida  4   Para  4   Term  4   Preterm      AB      Living  4     SAB      TAB      Ectopic      Multiple      Live Births  4            Home Medications    Prior to Admission medications   Medication Sig Start Date End Date Taking? Authorizing Provider  albuterol (VENTOLIN HFA) 108 (90 Base) MCG/ACT inhaler  Inhale 1-2 puffs into the lungs every 4 (four) hours as needed for wheezing or shortness of breath. 07/06/19   Jamelle Haring, MD  ALPRAZolam Prudy Feeler) 0.5 MG tablet Take 1 tablet (0.5 mg total) by mouth 3 (three) times daily as needed for anxiety. 09/27/19   Virl Diamond A, MD  HALOG 0.1 % external solution  06/23/19   [provider]  hydrochlorothiazide (HYDRODIURIL) 25 MG tablet Take 1 tablet (25 mg total) by mouth daily. 11/05/19 02/03/20  Debbe Odea, MD  losartan (COZAAR) 100 MG tablet Take 1  tablet (100 mg total) by mouth daily. 11/05/19 02/03/20  Debbe Odea, MD  sertraline (ZOLOFT) 25 MG tablet Take 1 tablet (25 mg total) by mouth at bedtime. 10/29/19   Danelle Berry, PA-C  VITAMIN D PO Take by mouth.    [provider]    Family History Family History  Problem Relation Age of Onset  . Hypertension Mother   . Thyroid disease Mother   . Kidney disease Mother   . Hypertension Father   . Cancer Father        Prostate  . Diabetes Brother   . Diabetes Other   . Cancer Paternal Uncle        Prostate  . Diabetes Maternal Grandmother   . Cancer Paternal Grandfather        Prostate  . Diabetes Sister   . Schizophrenia Sister   . Breast cancer Neg Hx   . Ovarian cancer Neg Hx   . Heart disease Neg Hx     Social History Social History   Tobacco Use  . Smoking status: Never Smoker  . Smokeless tobacco: Never Used  Vaping Use  . Vaping Use: Never used  Substance Use Topics  . Alcohol use: No    Alcohol/week: 0.0 standard drinks  . Drug use: No     Allergies   Contrave [naltrexone-bupropion hcl er]   Review of Systems Review of Systems  Constitutional: Negative for chills and fever.  HENT: Negative for ear pain and sore throat.   Eyes: Negative for pain and visual disturbance.  Respiratory: Positive for shortness of breath. Negative for cough.   Cardiovascular: Positive for chest pain and palpitations.  Gastrointestinal: Positive for nausea. Negative for abdominal pain and vomiting.  Genitourinary: Negative for dysuria and hematuria.  Musculoskeletal: Negative for arthralgias and back pain.  Skin: Negative for color change and rash.  Neurological: Positive for numbness. Negative for dizziness, seizures, syncope, facial asymmetry, speech difficulty, weakness and headaches.  All other systems reviewed and are negative.    Physical Exam Triage Vital Signs ED Triage Vitals [11/10/19 1508]  Enc Vitals Group     BP      Pulse      Resp       Temp      Temp src      SpO2      Weight      Height      Head Circumference      Peak Flow      Pain Score 0     Pain Loc      Pain Edu?      Excl. in GC?    No data found.  Updated Vital Signs BP 133/85   Pulse 76   Temp 99.4 F (37.4 C)   Resp 16   SpO2 97%   Visual Acuity Right Eye Distance:   Left Eye Distance:   Bilateral Distance:    Right  Eye Near:   Left Eye Near:    Bilateral Near:     Physical Exam Vitals and nursing note reviewed.  Constitutional:      General: She is not in acute distress.    Appearance: She is well-developed. She is not ill-appearing.  HENT:     Head: Normocephalic and atraumatic.     Mouth/Throat:     Mouth: Mucous membranes are moist.     Pharynx: Oropharynx is clear.  Eyes:     Conjunctiva/sclera: Conjunctivae normal.  Cardiovascular:     Rate and Rhythm: Normal rate and regular rhythm.     Heart sounds: Normal heart sounds. No murmur heard.   Pulmonary:     Effort: Pulmonary effort is normal. No respiratory distress.     Breath sounds: Normal breath sounds.  Abdominal:     Palpations: Abdomen is soft.     Tenderness: There is no abdominal tenderness. There is no guarding or rebound.  Musculoskeletal:     Cervical back: Neck supple.     Right lower leg: No edema.     Left lower leg: No edema.  Skin:    General: Skin is warm and dry.     Findings: No rash.  Neurological:     General: No focal deficit present.     Mental Status: She is alert and oriented to person, place, and time.     Cranial Nerves: No cranial nerve deficit.     Sensory: No sensory deficit.     Motor: No weakness.     Coordination: Coordination normal.     Gait: Gait normal.      UC Treatments / Results  Labs (all labs ordered are listed, but only abnormal results are displayed) Labs Reviewed - No data to display  EKG   Radiology No results found.  Procedures Procedures (including critical care time)  Medications Ordered in  UC Medications - No data to display  Initial Impression / Assessment and Plan / UC Course  I have reviewed the triage vital signs and the nursing notes.  Pertinent labs & imaging results that were available during my care of the patient were reviewed by me and considered in my medical decision making (see chart for details).   Chest pain.  Discussed limitations of urgent care versus ED with patient at length.  Instructed her to go to the emergency department if she has acute chest pain, shortness of breath, or other concerning symptoms.  EKG shows sinus rhythm, rate 63, 1 mm ST elevation in V2 which was present on her previous EKG done on 11/05/2019.  Instructed her to follow-up with her PCP tomorrow.  Patient agrees to plan of care.      Final Clinical Impressions(s) / UC Diagnoses   Final diagnoses:  Chest pain, unspecified type     Discharge Instructions     Go to the emergency department if you have acute chest pain, shortness of breath, or other concerning symptoms.    Follow-up with your primary care provider tomorrow.        ED Prescriptions    None     PDMP not reviewed this encounter.   Mickie Bail, NP 11/10/19 1550

## 2019-11-10 NOTE — Telephone Encounter (Signed)
Patient calling with complaint of belching, chest pain, nausea. She is asking if it could be related to CPAP because it is more frequent in the first part of the day. She reports the chest pain is sharp. She called [facility] and they prescribed pantoprazole and Gas X. Belching relieved by gingerale, chest pain in middle of chest. Please advise.

## 2019-11-11 ENCOUNTER — Ambulatory Visit: Payer: BC Managed Care – PPO | Admitting: Internal Medicine

## 2019-11-12 ENCOUNTER — Ambulatory Visit: Payer: BC Managed Care – PPO | Admitting: Cardiology

## 2019-11-12 ENCOUNTER — Ambulatory Visit: Payer: Self-pay | Admitting: *Deleted

## 2019-11-12 NOTE — Telephone Encounter (Signed)
Pt was seen in the ED on 10/29/2019 for this same pain.   She is c/o tightness in her chest in between my breast bone.  Denies radiation, shortness of breath, nausea.    "I took the medicine they gave me in the ER but it makes my chest more tight".   "They checked my heart and everything was fine".    "I feel like something is stuck in my chest".    "They didn't know what was wrong with me". I'm burping a lot and the tightness started after I ate.  The tightness is constant.  Per the protocol I have referred her back to the ED.     She replied, "Ok" and ended the call.   Reason for Disposition  [1] Chest pain (or "angina") comes and goes AND [2] is happening more often (increasing in frequency) or getting worse (increasing in severity) (Exception: chest pains that last only a few seconds)    Tightness in between her breastbone area a 5-6 on pain scale.  Answer Assessment - Initial Assessment Questions 1. LOCATION: "Where does it hurt?"       My chest is hurting intween my breast bone.   I was sent to the ER.  All my heart workup was fine.   They gave me medicine for my stomach.   When I take the medicine he gave me it makes my chest tight. I feel like I have bronchitis it feels like that but I don't have bronchitis.   I'm not sick.   I have numbness in both hands but that's not new for me.   2. RADIATION: "Does the pain go anywhere else?" (e.g., into neck, jaw, arms, back)     No 3. ONSET: "When did the chest pain begin?" (Minutes, hours or days)      It started not long after my ate.  I feel like something is stuck in my chest.  I felt this when I went to the ED. 4. PATTERN "Does the pain come and go, or has it been constant since it started?"  "Does it get worse with exertion?"      It's constant tightness since I ate.   I'm burping a lot. 5. DURATION: "How long does it last" (e.g., seconds, minutes, hours)     Constant 6. SEVERITY: "How bad is the pain?"  (e.g., Scale 1-10; mild,  moderate, or severe)    - MILD (1-3): doesn't interfere with normal activities     - MODERATE (4-7): interferes with normal activities or awakens from sleep    - SEVERE (8-10): excruciating pain, unable to do any normal activities       5-6 pain 7. CARDIAC RISK FACTORS: "Do you have any history of heart problems or risk factors for heart disease?" (e.g., angina, prior heart attack; diabetes, high blood pressure, high cholesterol, smoker, or strong family history of heart disease)     No all tests are fine for my heart.   It's all in my chest area.  Nothing in my stomach. 8. PULMONARY RISK FACTORS: "Do you have any history of lung disease?"  (e.g., blood clots in lung, asthma, emphysema, birth control pills)     Occasional bronchitis 9. CAUSE: "What do you think is causing the chest pain?"     I don't know.  My stomach feels fine it's all in my chest that feels tight.   I'm going to see a rheumatologist for the numbness in my hands.  That's not new. 10. OTHER SYMPTOMS: "Do you have any other symptoms?" (e.g., dizziness, nausea, vomiting, sweating, fever, difficulty breathing, cough)       Denies nausea or diarrhea, recent illness, shortness of breath.  Occasional dry cough. 11. PREGNANCY: "Is there any chance you are pregnant?" "When was your last menstrual period?"       Not asked  Protocols used: CHEST PAIN-A-AH

## 2019-11-13 DIAGNOSIS — G4733 Obstructive sleep apnea (adult) (pediatric): Secondary | ICD-10-CM | POA: Diagnosis not present

## 2019-11-19 DIAGNOSIS — R0789 Other chest pain: Secondary | ICD-10-CM | POA: Diagnosis not present

## 2019-11-22 ENCOUNTER — Other Ambulatory Visit: Payer: Self-pay | Admitting: Family Medicine

## 2019-11-26 DIAGNOSIS — R1013 Epigastric pain: Secondary | ICD-10-CM | POA: Diagnosis not present

## 2019-11-26 DIAGNOSIS — E538 Deficiency of other specified B group vitamins: Secondary | ICD-10-CM | POA: Diagnosis not present

## 2019-11-26 DIAGNOSIS — D8989 Other specified disorders involving the immune mechanism, not elsewhere classified: Secondary | ICD-10-CM | POA: Diagnosis not present

## 2019-11-26 DIAGNOSIS — R142 Eructation: Secondary | ICD-10-CM | POA: Diagnosis not present

## 2019-11-26 DIAGNOSIS — R1310 Dysphagia, unspecified: Secondary | ICD-10-CM | POA: Diagnosis not present

## 2019-11-26 DIAGNOSIS — M25541 Pain in joints of right hand: Secondary | ICD-10-CM | POA: Diagnosis not present

## 2019-11-26 DIAGNOSIS — Z01818 Encounter for other preprocedural examination: Secondary | ICD-10-CM | POA: Diagnosis not present

## 2019-11-26 DIAGNOSIS — R768 Other specified abnormal immunological findings in serum: Secondary | ICD-10-CM | POA: Diagnosis not present

## 2019-11-26 DIAGNOSIS — M25542 Pain in joints of left hand: Secondary | ICD-10-CM | POA: Diagnosis not present

## 2019-11-26 DIAGNOSIS — M7918 Myalgia, other site: Secondary | ICD-10-CM | POA: Diagnosis not present

## 2019-11-26 DIAGNOSIS — R200 Anesthesia of skin: Secondary | ICD-10-CM | POA: Diagnosis not present

## 2019-12-02 ENCOUNTER — Ambulatory Visit: Payer: BC Managed Care – PPO | Admitting: Internal Medicine

## 2019-12-03 ENCOUNTER — Ambulatory Visit: Payer: BC Managed Care – PPO

## 2019-12-03 ENCOUNTER — Encounter: Payer: Self-pay | Admitting: Family Medicine

## 2019-12-03 ENCOUNTER — Other Ambulatory Visit: Payer: Self-pay

## 2019-12-03 ENCOUNTER — Ambulatory Visit (INDEPENDENT_AMBULATORY_CARE_PROVIDER_SITE_OTHER): Payer: BC Managed Care – PPO | Admitting: Family Medicine

## 2019-12-03 VITALS — BP 100/70 | HR 84 | Temp 96.9°F | Resp 16 | Ht 68.0 in | Wt 188.2 lb

## 2019-12-03 DIAGNOSIS — E876 Hypokalemia: Secondary | ICD-10-CM

## 2019-12-03 DIAGNOSIS — N6315 Unspecified lump in the right breast, overlapping quadrants: Secondary | ICD-10-CM | POA: Diagnosis not present

## 2019-12-03 DIAGNOSIS — I10 Essential (primary) hypertension: Secondary | ICD-10-CM | POA: Diagnosis not present

## 2019-12-03 DIAGNOSIS — N6011 Diffuse cystic mastopathy of right breast: Secondary | ICD-10-CM

## 2019-12-03 DIAGNOSIS — N6012 Diffuse cystic mastopathy of left breast: Secondary | ICD-10-CM

## 2019-12-03 LAB — POTASSIUM: Potassium: 3.7 mmol/L (ref 3.5–5.3)

## 2019-12-03 NOTE — Progress Notes (Signed)
Name: Andrea Pope   MRN: [id number]    DOB: 1970/06/05   Date:12/03/2019       Progress Note  Subjective  Chief Complaint  Chief Complaint  Patient presents with  . Breast Problem    She was having some tenderness in her breast. Tenderness has resolved. She is due for a mammogram, but plans on getting vaccinated today and wanted to wait on mammogram if there are no issues with her breast.    HPI  Breasts tenderness: she had mammogram and [place] of both breasts last year because of lumps, report states fibrocystic cysts, she had endometrial ablation a few years ago and does not have cycles, explained to her she still has hormonal changes that can cause breast tenderness. She states her breast tenderness on both breasts mostly on medial aspect, left worse than right, it happened a couple weeks ago and lasted about one week. She states pain is not as intense. She called to schedule mammogram and was advised to be seen first.  Advised her to schedule mammogram prior to first vaccine  HTN: seen by cardiologist 11/05/2019 and is now on Losartan 100 mg and HCTZ 25 mg, she went to [facility] on 11/10/2019 and potassium was low, bp today is great and towards low end of normal, discussed rechecking potassium and consider going to losartan hctz 100/12.5 mg dose. She has a follow up with Dr. Azucena Cecil on Monday and will ask about combining medication. No chest pain or palpitation   Patient Active Problem List   Diagnosis Date Noted  . 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 Obstructive sleep apnea 12/30/2018  . Absolute anemia 01/09/2018  . Vitamin B12 deficiency 01/08/2018  . Vitamin D deficiency 01/08/2018  . Positive ANA (antinuclear antibody) 12/15/2017  . Primary osteoarthritis of right knee 05/13/2017  . Overweight (BMI 25.0-29.9) 11/19/2016  . White coat hypertension 01/25/2016  . Dysmenorrhea 01/03/2016  . High cholesterol 01/01/2016  . History of ectopic  pregnancy 11/29/2015  . Abnormal thyroid stimulating hormone (TSH) level 01/10/2015  . Allergic rhinitis, seasonal 01/10/2015  . Asymptomatic varicose veins of both lower extremities 01/10/2015    Past Surgical History:  Procedure Laterality Date  . CHOLECYSTECTOMY N/A 01/19/2016   Procedure: LAPAROSCOPIC CHOLECYSTECTOMY WITH INTRAOPERATIVE CHOLANGIOGRAM;  Surgeon: Kieth Brightly, MD;  Location: [facility] ORS;  Service: General;  Laterality: N/A;  . DILITATION & CURRETTAGE/HYSTROSCOPY WITH NOVASURE ABLATION N/A 03/24/2017   Procedure: DILATATION & CURETTAGE/HYSTEROSCOPY WITH NOVASURE ABLATION;  Surgeon: Herold Harms, MD;  Location: [facility] ORS;  Service: Gynecology;  Laterality: N/A;  . laser surgery on eye Right 10/2016    Family History  Problem Relation Age of Onset  . Hypertension Mother   . Thyroid disease Mother   . Kidney disease Mother   . Hypertension Father   . Cancer Father        Prostate  . Diabetes Brother   . Diabetes Other   . Cancer Paternal Uncle        Prostate  . Diabetes Maternal Grandmother   . Cancer Paternal Grandfather        Prostate  . Diabetes Sister   . Schizophrenia Sister   . Breast cancer Neg Hx   . Ovarian cancer Neg Hx   . Heart disease Neg Hx     Social History   Tobacco Use  . Smoking status: Never Smoker  . Smokeless tobacco: Never Used  Substance Use Topics  . Alcohol use: No  Alcohol/week: 0.0 standard drinks     Current Outpatient Medications:  .  albuterol (VENTOLIN HFA) 108 (90 Base) MCG/ACT inhaler, Inhale 1-2 puffs into the lungs every 4 (four) hours as needed for wheezing or shortness of breath., Disp: 6.7 g, Rfl: 1 .  hydrochlorothiazide (HYDRODIURIL) 25 MG tablet, Take 1 tablet (25 mg total) by mouth daily., Disp: 30 tablet, Rfl: 6 .  losartan (COZAAR) 100 MG tablet, Take 1 tablet (100 mg total) by mouth daily., Disp: 30 tablet, Rfl: 6 .  VITAMIN D PO, Take by mouth., Disp: , Rfl:  .  pantoprazole (PROTONIX)  40 MG tablet, Take 40 mg by mouth 2 (two) times daily., Disp: , Rfl:   Allergies  Allergen Reactions  . Contrave [Naltrexone-Bupropion Hcl Er]     Palpitations, hypertension    I personally reviewed active problem list, medication list, allergies, family history, social history with the patient/caregiver today.   ROS  Ten systems reviewed and is negative except as mentioned in HPI   Objective  Vitals:   12/03/19 0746  BP: 100/70  Pulse: 84  Resp: 16  Temp: (!) 96.9 F (36.1 C)  TempSrc: Temporal  SpO2: 99%  Weight: 188 lb 3.2 oz (85.4 kg)  Height: 5\' 8"  (1.727 m)    Body mass index is 28.62 kg/m.  Physical Exam  Constitutional: Patient appears well-developed and well-nourished. Overweight. No distress.  HEENT: head atraumatic, normocephalic, pupils equal and reactive to light, neck supple  Cardiovascular: Normal rate, regular rhythm and normal heart sounds.  No murmur heard. No BLE edema. Pulmonary/Chest: Effort normal and breath sounds normal. No respiratory distress. Abdominal: Soft.  There is no tenderness. Psychiatric: Patient has a normal mood and affect. behavior is normal. Judgment and thought content normal.  Recent Results (from the past 2160 hour(s))  CBC with Differential/Platelet     Status: Abnormal   Collection Time: 09/07/19 10:17 AM  Result Value Ref Range   WBC 8.5 3.8 - 10.8 Thousand/uL   RBC 4.41 3.80 - 5.10 Million/uL   Hemoglobin 13.7 11.7 - 15.5 g/dL   HCT 11/07/19 35 - 45 %   MCV 92.3 80.0 - 100.0 fL   MCH 31.1 27.0 - 33.0 pg   MCHC 33.7 32.0 - 36.0 g/dL   RDW 18.2 99.3 - 71.6 %   Platelets 347 140 - 400 Thousand/uL   MPV 10.9 7.5 - 12.5 fL   Neutro Abs 6,715 1,500 - 7,800 cells/uL   Lymphs Abs 1,258 850 - 3,900 cells/uL   Absolute Monocytes 510 200 - 950 cells/uL   Eosinophils Absolute 0 (L) 15 - 500 cells/uL   Basophils Absolute 17 0 - 200 cells/uL   Neutrophils Relative % 79 %   Total Lymphocyte 14.8 %   Monocytes Relative 6.0 %    Eosinophils Relative 0.0 %   Basophils Relative 0.2 %  COMPLETE METABOLIC PANEL WITH GFR     Status: None   Collection Time: 09/07/19 10:17 AM  Result Value Ref Range   Glucose, Bld 94 65 - 99 mg/dL    Comment: .            Fasting reference interval .    BUN 13 7 - 25 mg/dL   Creat 11/07/19 8.93 - 8.10 mg/dL   GFR, Est Non African American 84 > OR = 60 mL/min/1.12m2   GFR, Est African American 97 > OR = 60 mL/min/1.26m2   BUN/Creatinine Ratio NOT APPLICABLE 6 - 22 (calc)   Sodium 137 135 -  146 mmol/L   Potassium 4.1 3.5 - 5.3 mmol/L   Chloride 103 98 - 110 mmol/L   CO2 25 20 - 32 mmol/L   Calcium 9.4 8.6 - 10.2 mg/dL   Total Protein 6.8 6.1 - 8.1 g/dL   Albumin 4.2 3.6 - 5.1 g/dL   Globulin 2.6 1.9 - 3.7 g/dL (calc)   AG Ratio 1.6 1.0 - 2.5 (calc)   Total Bilirubin 0.4 0.2 - 1.2 mg/dL   Alkaline phosphatase (APISO) 50 31 - 125 U/L   AST 11 10 - 35 U/L   ALT 8 6 - 29 U/L  TSH     Status: Abnormal   Collection Time: 09/07/19 10:17 AM  Result Value Ref Range   TSH 0.28 (L) mIU/L    Comment:           Reference Range .           > or = 20 Years  0.40-4.50 .                Pregnancy Ranges           First trimester    0.26-2.66           Second trimester   0.55-2.73           Third trimester    0.43-2.91   Urinalysis, Routine w reflex microscopic     Status: Abnormal   Collection Time: 09/07/19 10:17 AM  Result Value Ref Range   Color, Urine YELLOW YELLOW   APPearance CLOUDY (A) CLEAR   Specific Gravity, Urine 1.027 1.001 - 1.03   pH 6.0 5.0 - 8.0   Glucose, UA NEGATIVE NEGATIVE   Bilirubin Urine NEGATIVE NEGATIVE   Ketones, ur TRACE (A) NEGATIVE   Hgb urine dipstick NEGATIVE NEGATIVE   Protein, ur NEGATIVE NEGATIVE   Nitrite NEGATIVE NEGATIVE   Leukocytes,Ua NEGATIVE NEGATIVE  T4, free     Status: None   Collection Time: 09/07/19 10:17 AM  Result Value Ref Range   Free T4 1.1 0.8 - 1.8 ng/dL  VITAMIN D 25 Hydroxy (Vit-D Deficiency, Fractures)     Status: None    Collection Time: 09/07/19 10:17 AM  Result Value Ref Range   Vit D, 25-Hydroxy 33 30 - 100 ng/mL    Comment: Vitamin D Status         25-OH Vitamin D: . Deficiency:                    <20 ng/mL Insufficiency:             20 - 29 ng/mL Optimal:                 > or = 30 ng/mL . For 25-OH Vitamin D testing on patients on  D2-supplementation and patients for whom quantitation  of D2 and D3 fractions is required, the [employer](TM) 25-OH VIT D, (D2,D3), LC/MS/MS is recommended: order  code 16109 (patients >13yrs). See Note 1 . Note 1 . For additional information, please refer to  http://education.[facility].com/faq/FAQ199  (This link is being provided for informational/ educational purposes only.)   POCT glycosylated hemoglobin (Hb A1C)     Status: Normal   Collection Time: 09/07/19 10:37 AM  Result Value Ref Range   Hemoglobin A1C 5.2 4.0 - 5.6 %   HbA1c POC (<> result, manual entry)     HbA1c, POC (prediabetic range)     HbA1c, POC (controlled diabetic range)    Basic metabolic  panel     Status: Abnormal   Collection Time: 09/26/19 10:21 PM  Result Value Ref Range   Sodium 139 135 - 145 mmol/L   Potassium 3.9 3.5 - 5.1 mmol/L   Chloride 105 98 - 111 mmol/L   CO2 25 22 - 32 mmol/L   Glucose, Bld 121 (H) 70 - 99 mg/dL    Comment: Glucose reference range applies only to samples taken after fasting for at least 8 hours.   BUN 16 6 - 20 mg/dL   Creatinine, Ser 1.610.87 0.44 - 1.00 mg/dL   Calcium 8.8 (L) 8.9 - 10.3 mg/dL   GFR calc non Af Amer >60 >60 mL/min   GFR calc Af Amer >60 >60 mL/min   Anion gap 9 5 - 15    Comment: Performed at [facility], [address] [place], [place] [address]  CBC     Status: None   Collection Time: 09/26/19 10:21 PM  Result Value Ref Range   WBC 5.9 4.0 - 10.5 K/uL   RBC 4.51 3.87 - 5.11 MIL/uL   Hemoglobin 13.8 12.0 - 15.0 g/dL   HCT 54.040.6 36 - 46 %   MCV 90.0 80.0 - 100.0 fL   MCH 30.6 26.0 - 34.0 pg   MCHC 34.0 30.0 -  36.0 g/dL   RDW 98.112.7 19.111.5 - 47.815.5 %   Platelets 383 150 - 400 K/uL   nRBC 0.0 0.0 - 0.2 %    Comment: Performed at [facility], [address] [place], [place] [address]  Urinalysis, Complete w Microscopic     Status: Abnormal   Collection Time: 09/26/19 10:21 PM  Result Value Ref Range   Color, Urine YELLOW (A) YELLOW   APPearance HAZY (A) CLEAR   Specific Gravity, Urine 1.015 1.005 - 1.030   pH 5.0 5.0 - 8.0   Glucose, UA NEGATIVE NEGATIVE mg/dL   Hgb urine dipstick NEGATIVE NEGATIVE   Bilirubin Urine NEGATIVE NEGATIVE   Ketones, ur NEGATIVE NEGATIVE mg/dL   Protein, ur NEGATIVE NEGATIVE mg/dL   Nitrite NEGATIVE NEGATIVE   Leukocytes,Ua NEGATIVE NEGATIVE   WBC, UA 0-5 0 - 5 WBC/hpf   Bacteria, UA RARE (A) NONE SEEN   Squamous Epithelial / LPF 6-10 0 - 5   Mucus PRESENT     Comment: Performed at [facility], [address] [place], [place] [address]  Troponin I (High Sensitivity)     Status: None   Collection Time: 09/26/19 10:21 PM  Result Value Ref Range   Troponin I (High Sensitivity) 4 <18 ng/L    Comment: (NOTE) Elevated high sensitivity troponin I (hsTnI) values and significant  changes across serial measurements may suggest ACS but many other  chronic and acute conditions are known to elevate hsTnI results.  Refer to the "Links" section for chest pain algorithms and additional  guidance. Performed at [facility], [address] [place], [place] [address]   Pregnancy, urine POC     Status: None   Collection Time: 09/26/19 10:30 PM  Result Value Ref Range   Preg Test, Ur NEGATIVE NEGATIVE    Comment:        THE SENSITIVITY OF THIS METHODOLOGY IS >24 mIU/mL   Troponin I (High Sensitivity)     Status: None   Collection Time: 09/27/19  4:46 AM  Result Value Ref Range   Troponin I (High Sensitivity) 3 <18 ng/L    Comment: (NOTE) Elevated high sensitivity troponin I (hsTnI) values and  significant  changes across serial  measurements may suggest ACS but many other  chronic and acute conditions are known to elevate hsTnI results.  Refer to the "Links" section for chest pain algorithms and additional  guidance. Performed at [facility], [address] [place], [place] [address]   TSH     Status: None   Collection Time: 09/27/19  4:46 AM  Result Value Ref Range   TSH 1.099 0.350 - 4.500 uIU/mL    Comment: Performed by a 3rd Generation assay with a functional sensitivity of <=0.01 uIU/mL. Performed at [facility], [address] [place], [place] [address]   T4, free     Status: None   Collection Time: 09/27/19  4:46 AM  Result Value Ref Range   Free T4 0.82 0.61 - 1.12 ng/dL    Comment: (NOTE) Biotin ingestion may interfere with free T4 tests. If the results are inconsistent with the TSH level, previous test results, or the clinical presentation, then consider biotin interference. If needed, order repeat testing after stopping biotin. Performed at [facility], [address] [place], [place] [address]   Fibrin derivatives D-Dimer     Status: None   Collection Time: 09/27/19  4:46 AM  Result Value Ref Range   Fibrin derivatives D-dimer ([facility]) 140.96 0.00 - 499.00 ng/mL (FEU)    Comment: (NOTE) <> Exclusion of Venous Thromboembolism (VTE) - OUTPATIENT ONLY   (Emergency Department or [place])   0-499 ng/ml (FEU): With a low to intermediate pretest probability                      for VTE this test result excludes the diagnosis                      of VTE.   >499 ng/ml (FEU) : VTE not excluded; additional work up for VTE is                      required. <> Testing on Inpatients and Evaluation of Disseminated Intravascular   Coagulation (DIC) Reference Range:   0-499 ng/ml (FEU) Performed at [facility], [address] [place], [place] [address]   Basic metabolic panel     Status: Abnormal   Collection Time: 11/10/19  4:46 PM  Result Value Ref Range    Sodium 138 135 - 145 mmol/L   Potassium 3.1 (L) 3.5 - 5.1 mmol/L   Chloride 98 98 - 111 mmol/L   CO2 29 22 - 32 mmol/L   Glucose, Bld 89 70 - 99 mg/dL    Comment: Glucose reference range applies only to samples taken after fasting for at least 8 hours.   BUN 13 6 - 20 mg/dL   Creatinine, Ser 3.55 0.44 - 1.00 mg/dL   Calcium 9.2 8.9 - 73.2 mg/dL   GFR calc non Af Amer >60 >60 mL/min   GFR calc Af Amer >60 >60 mL/min   Anion gap 11 5 - 15    Comment: Performed at [facility], [address] [place], [place] [address]  CBC     Status: None   Collection Time: 11/10/19  4:46 PM  Result Value Ref Range   WBC 4.4 4.0 - 10.5 K/uL   RBC 4.65 3.87 - 5.11 MIL/uL   Hemoglobin 14.3 12.0 - 15.0 g/dL   HCT 27.0 36 - 46 %   MCV 91.4 80.0 - 100.0 fL  MCH 30.8 26.0 - 34.0 pg   MCHC 33.6 30.0 - 36.0 g/dL   RDW 96.0 45.4 - 09.8 %   Platelets 347 150 - 400 K/uL   nRBC 0.0 0.0 - 0.2 %    Comment: Performed at [facility], [address] [place], [place] [address]  Troponin I (High Sensitivity)     Status: None   Collection Time: 11/10/19  4:46 PM  Result Value Ref Range   Troponin I (High Sensitivity) 4 <18 ng/L    Comment: (NOTE) Elevated high sensitivity troponin I (hsTnI) values and significant  changes across serial measurements may suggest ACS but many other  chronic and acute conditions are known to elevate hsTnI results.  Refer to the "Links" section for chest pain algorithms and additional  guidance. Performed at [facility], [address] [place], [place] [address]   Troponin I (High Sensitivity)     Status: None   Collection Time: 11/10/19  8:08 PM  Result Value Ref Range   Troponin I (High Sensitivity) 5 <18 ng/L    Comment: (NOTE) Elevated high sensitivity troponin I (hsTnI) values and significant  changes across serial measurements may suggest ACS but many other  chronic and acute conditions are known to elevate hsTnI results.  Refer to  the "Links" section for chest pain algorithms and additional  guidance. Performed at [facility], [address] [place], [place] [address]       PHQ2/9: Depression screen [facility] 2/9 10/28/2019 09/07/2019 07/06/2019 05/26/2019 05/06/2019  Decreased Interest 0 0 0 0 0  Down, Depressed, Hopeless 0 0 0 0 0  PHQ - 2 Score 0 0 0 0 0  Altered sleeping 0 0 0 - 0  Tired, decreased energy 0 0 0 - 0  Change in appetite 0 0 0 - 0  Feeling bad or failure about yourself  0 0 0 - 0  Trouble concentrating 0 0 0 - 0  Moving slowly or fidgety/restless 0 0 0 - 0  Suicidal thoughts 0 0 0 - 0  PHQ-9 Score 0 0 0 - 0  Difficult doing work/chores Not difficult at all Not difficult at all Not difficult at all - Not difficult at all  Some recent data might be hidden    phq 9 is negative   Fall Risk: Fall Risk  12/03/2019 10/28/2019 09/07/2019 07/06/2019 05/26/2019  Falls in the past year? 0 0 0 0 0  Number falls in past yr: 0 0 0 0 -  Injury with Fall? 0 0 0 0 -  Follow up - - - - -    Assessment & Plan  1. Hypokalemia  - Potassium  2. Fibrocystic breast changes, bilateral  Explained likely the cause of her symptoms   3. Breast lump on right side at 12 o'clock position  - MM Digital Diagnostic Bilat; Future - US BREAST LTD UNI RIGHT INC AXILLA; Future - MM DIAG BREAST TOMO BILATERAL; Future  4. Hypertension, benign  Continue medications, recheck potassium and if still low consider going down ton HCTZ to 12.5 mg daily, she has a follow up scheduled with Dr. Azucena Cecil next week

## 2019-12-03 NOTE — Patient Instructions (Signed)
Fibrocystic Breast Changes  Fibrocystic breast changes are changes in breast tissue that can cause breasts to become swollen, lumpy, or painful. This can happen due to buildup of scar-like tissue (fibrous tissue) or the forming of fluid-filled lumps (cysts) in the breast. This is a common condition, and it is not cancerous (is benign). The exact cause is not known, but it seems to occur when women go through hormonal changes during their menstrual cycle. Fibrocystic breast changes can affect one or both breasts. What are the causes? The exact cause of fibrocystic breast changes is not known. However, this condition:  May be related to the female hormones estrogen and progesterone.  May be influenced by family traits that get passed from parent to child (genetics). What are the signs or symptoms? Symptoms of this condition may affect one or both breasts, and may include:  Tenderness, mild discomfort, or pain.  Swelling.  Rope-like tissue that can be felt when touching the breast.  Lumps in one or both breasts.  Changes in breast size. Breasts may get larger before the menstrual period and smaller after the menstrual period.  Green or dark brown discharge from the nipple. Symptoms are usually worse before menstrual periods start, and they get better toward the end of menstrual periods. How is this diagnosed? This condition is diagnosed based on your medical history and a physical exam of your breasts. You may also have tests, such as:  A breast X-ray (mammogram).  Ultrasound of your breasts.  MRI.  Removal of a breast tissue sample for testing (breast biopsy). This may be done if your health care provider thinks that something else may be causing changes in your breasts. How is this treated? Often, treatment is not needed for this condition. In some cases, treatment may include:  Taking over-the-counter pain relievers to help lessen pain or discomfort.  Limiting or avoiding  caffeine. Foods and beverages that contain caffeine include chocolate, soda, coffee, and tea.  Reducing sugar and fat in your diet. Your health care provider may also recommend:  A procedure to remove fluid from a cyst that is causing pain (fine needle aspiration).  Surgery to remove a cyst that is large or tender or does not go away. Follow these instructions at home:  Examine your breasts after every menstrual period. If you do not have menstrual periods, check your breasts on the first day of every month. Feel for changes in your breasts, such as: ? More tenderness. ? A new growth. ? A change in size. ? A change in an existing lump.  Take over-the-counter and prescription medicines only as told by your health care provider.  Wear a well-fitted support or sports bra, especially when exercising.  Decrease or avoid caffeine, fat, and sugar in your diet as directed by your health care provider. Contact a health care provider if:  You have fluid leaking from your nipple, especially if it is bloody.  You have new lumps or bumps in your breast.  Your breast becomes enlarged, red, and painful.  You have areas of your breast that pucker inward.  Your nipple appears flat or indented. Get help right away if:  You have redness of your breast and the redness is spreading. Summary  Fibrocystic breast changes are changes in breast tissue that can cause breasts to become swollen, lumpy, or painful.  This condition may be related to the female hormones estrogen and progesterone.  With this condition, it is important to examine your breasts after every   menstrual period. If you do not have menstrual periods, check your breasts on the first day of every month. This information is not intended to replace advice given to you by your health care provider. Make sure you discuss any questions you have with your health care provider. Document Revised: 04/04/2017 Document Reviewed:  12/20/2015 Elsevier Patient Education  2020 [employer]  

## 2019-12-04 ENCOUNTER — Ambulatory Visit: Payer: BC Managed Care – PPO

## 2019-12-06 ENCOUNTER — Other Ambulatory Visit (HOSPITAL_COMMUNITY): Payer: Self-pay

## 2019-12-06 ENCOUNTER — Other Ambulatory Visit: Payer: Self-pay

## 2019-12-06 ENCOUNTER — Encounter: Payer: Self-pay | Admitting: Cardiology

## 2019-12-06 ENCOUNTER — Ambulatory Visit (INDEPENDENT_AMBULATORY_CARE_PROVIDER_SITE_OTHER): Payer: BC Managed Care – PPO | Admitting: Cardiology

## 2019-12-06 VITALS — BP 104/60 | HR 60 | Ht 68.0 in | Wt 187.0 lb

## 2019-12-06 DIAGNOSIS — R072 Precordial pain: Secondary | ICD-10-CM

## 2019-12-06 DIAGNOSIS — I10 Essential (primary) hypertension: Secondary | ICD-10-CM | POA: Diagnosis not present

## 2019-12-06 DIAGNOSIS — R0600 Dyspnea, unspecified: Secondary | ICD-10-CM

## 2019-12-06 DIAGNOSIS — R079 Chest pain, unspecified: Secondary | ICD-10-CM

## 2019-12-06 DIAGNOSIS — R0609 Other forms of dyspnea: Secondary | ICD-10-CM

## 2019-12-06 MED ORDER — HYDROCHLOROTHIAZIDE 12.5 MG PO TABS
12.5000 mg | ORAL_TABLET | Freq: Every day | ORAL | 6 refills | Status: DC
Start: 2019-12-06 — End: 2020-02-25

## 2019-12-06 MED ORDER — METOPROLOL TARTRATE 50 MG PO TABS
50.0000 mg | ORAL_TABLET | Freq: Once | ORAL | 0 refills | Status: DC
Start: 2019-12-06 — End: 2020-01-28

## 2019-12-06 NOTE — Progress Notes (Signed)
Cardiology Office Note:    Date:  12/06/2019   ID:  Andrea Pope, DOB 08-30-1970, MRN [id number]  PCP:  Delsa Grana, PA-C  Cardiologist:  Kate Sable, MD  Electrophysiologist:  None   Referring MD: Delsa Grana, PA-C   Chief Complaint  Patient presents with  . Other    1 month follow up. Patient c/o SOB and chest pain with exertion - patient would like a stress test.  Meds reviewed verbally with patient.     History of Present Illness:    Andrea Pope is a 49 y.o. female with history of hypertension, anxiety who presents for follow-up.  Last seen due to elevated blood pressures.  HCTZ was started.  Patient takes losartan 100 mg daily.  Prior work-up with cardiac monitor for palpitations was unrevealing.  Her blood pressure improved with HCTZ, now BP is low normal with systolics in the low 409W.  She also endorses shortness of breath and chest pain over the past several weeks.  Typically gets out of breath whenever she goes upstairs.  Patient is a Radio producer.  This is new and has been persistent.  She notes atypical chest pain which she describes as sharp.  Lasting a few seconds.  Symptoms are not always associated with exertion.  Has a history of reflux, GI work-up underway.  Protonix helped symptoms somewhat but did not totally relieve it.    Past Medical History:  Diagnosis Date  . Abnormal thyroid stimulating hormone level   . Allergy   . Anemia    iron deficiency  . Encounter for initial prescription of injectable contraceptive   . Female stress incontinence   . Gallstones 12/05/2015  . High cholesterol   . History of ectopic pregnancy 11/29/2015  . Hypertension   . Menorrhagia with irregular cycle   . Neutropenia ([place]) 07/23/2016  . Ovarian cyst 11/29/2015  . Ovarian cyst 11/29/2015  . 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 Proteinuria 08/14/2015  . Spider veins of both lower extremities   . Status post D&C/hysteroscopy/endometrial ablation 03/24/2017    Good TVH candidate 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 Pathology: Proliferative endometrium with breakdown changes; negative for atypia/hyperplasia/carcinoma    Past Surgical History:  Procedure Laterality Date  . CHOLECYSTECTOMY N/A 01/19/2016   Procedure: LAPAROSCOPIC CHOLECYSTECTOMY WITH INTRAOPERATIVE CHOLANGIOGRAM;  Surgeon: Christene Lye, MD;  Location: [facility] ORS;  Service: General;  Laterality: N/A;  . DILITATION & CURRETTAGE/HYSTROSCOPY WITH NOVASURE ABLATION N/A 03/24/2017   Procedure: DILATATION & CURETTAGE/HYSTEROSCOPY WITH NOVASURE ABLATION;  Surgeon: Brayton Mars, MD;  Location: [facility] ORS;  Service: Gynecology;  Laterality: N/A;  . laser surgery on eye Right 10/2016    Current Medications: Current Meds  Medication Sig  . albuterol (VENTOLIN HFA) 108 (90 Base) MCG/ACT inhaler Inhale 1-2 puffs into the lungs every 4 (four) hours as needed for wheezing or shortness of breath.  . hydrochlorothiazide (HYDRODIURIL) 12.5 MG tablet Take 1 tablet (12.5 mg total) by mouth daily.  Marland Kitchen losartan (COZAAR) 100 MG tablet Take 1 tablet (100 mg total) by mouth daily.  . pantoprazole (PROTONIX) 40 MG tablet Take 40 mg by mouth 2 (two) times daily.  Marland Kitchen VITAMIN D PO Take by mouth once a week.   . [DISCONTINUED] hydrochlorothiazide (HYDRODIURIL) 25 MG tablet Take 1 tablet (25 mg total) by mouth daily.     Allergies:   Contrave [naltrexone-bupropion hcl er]   Social History   Socioeconomic History  . Marital status: Married    Spouse name:  Ron   . Number of children: 4  . Years of education: Not on file  . Highest education level: Bachelor's degree (e.g., BA, AB, BS)  Occupational History  . Occupation: Pharmacist, hospital  Tobacco Use  . Smoking status: Never Smoker  . Smokeless tobacco: Never Used  Vaping Use  . Vaping Use: Never used  Substance and Sexual Activity  . Alcohol use: No    Alcohol/week: 0.0 standard drinks  . Drug use: No  . Sexual activity: Yes    Partners:  Male    Birth control/protection: None  Other Topics Concern  . Not on file  Social History Narrative  . Not on file   Social Determinants of Health   Financial Resource Strain:   . Difficulty of Paying Living Expenses:   Food Insecurity:   . Worried About Charity fundraiser in the Last Year:   . Arboriculturist in the Last Year:   Transportation Needs:   . Film/video editor (Medical):   Marland Kitchen Lack of Transportation (Non-Medical):   Physical Activity:   . Days of Exercise per Week:   . Minutes of Exercise per Session:   Stress:   . Feeling of Stress :   Social Connections:   . Frequency of Communication with Friends and Family:   . Frequency of Social Gatherings with Friends and Family:   . Attends Religious Services:   . Active Member of Clubs or Organizations:   . Attends Archivist Meetings:   Marland Kitchen Marital Status:      Family History: The patient's family history includes Cancer in her father, paternal grandfather, and paternal uncle; Diabetes in her brother, maternal grandmother, sister, and another family member; Hypertension in her father and mother; Kidney disease in her mother; Schizophrenia in her sister; Thyroid disease in her mother. There is no history of Breast cancer, Ovarian cancer, or Heart disease.  ROS:   Please see the history of present illness.     All other systems reviewed and are negative.  EKGs/Labs/Other Studies Reviewed:    The following studies were reviewed today:   EKG:  EKG is  ordered today.  The ekg ordered today demonstrates normal sinus rhythm, normal ECG.  Recent Labs: 09/07/2019: ALT 8 09/27/2019: TSH 1.099 11/10/2019: BUN 13; Creatinine, Ser 1.00; Hemoglobin 14.3; Platelets 347; Sodium 138 12/03/2019: Potassium 3.7  Recent Lipid Panel    Component Value Date/Time   CHOL 188 12/30/2018 0000   TRIG 144 12/30/2018 0000   HDL 61 12/30/2018 0000   CHOLHDL 3.1 12/30/2018 0000   VLDL 21 11/19/2016 1513   LDLCALC 103 (H)  12/30/2018 0000    Physical Exam:    VS:  BP (!) 104/60 (BP Location: Left Arm, Patient Position: Sitting, Cuff Size: Normal)   Pulse 60   Ht 5' 8" (1.727 m)   Wt 187 lb (84.8 kg)   SpO2 98%   BMI 28.43 kg/m     Wt Readings from Last 3 Encounters:  12/06/19 187 lb (84.8 kg)  12/03/19 188 lb 3.2 oz (85.4 kg)  11/10/19 185 lb (83.9 kg)     GEN:  Well nourished, well developed in no acute distress HEENT: Normal NECK: No JVD; No carotid bruits LYMPHATICS: No lymphadenopathy CARDIAC: RRR, no murmurs, rubs, gallops RESPIRATORY:  Clear to auscultation without rales, wheezing or rhonchi  ABDOMEN: Soft, non-tender, non-distended MUSCULOSKELETAL:  No edema; No deformity  SKIN: Warm and dry NEUROLOGIC:  Alert and oriented x 3 PSYCHIATRIC:  Normal affect   ASSESSMENT:    1. Chest pain of uncertain etiology   2. Dyspnea on exertion   3. Essential hypertension   4. Precordial pain    PLAN:    In order of problems listed above:  1. Patient with history of atypical chest pain.  Has risk factors of hypertension.  Also endorses dyspnea on exertion.  Will evaluate patient with an echocardiogram for any structural defects, get coronary CTA to evaluate presence of CAD. 2. Dyspnea on exertion, get echo and coronary CTA as above. 3. History of hypertension, BP low normal today.  Decrease HCTZ to 12.5 mg daily.  Continue losartan.  Follow-up after echocardiogram and coronary CTA.  Total encounter time 40 minutes  Greater than 50% was spent in counseling and coordination of care with the patient   This note was generated in part or whole with voice recognition software. Voice recognition is usually quite accurate but there are transcription errors that can and very often do occur. I apologize for any typographical errors that were not detected and corrected.  Medication Adjustments/Labs and Tests Ordered: Current medicines are reviewed at length with the patient today.  Concerns  regarding medicines are outlined above.  Orders Placed This Encounter  Procedures  . CT CORONARY MORPH W/CTA COR W/SCORE W/CA W/CM &/OR WO/CM  . CT CORONARY FRACTIONAL FLOW RESERVE DATA PREP  . CT CORONARY FRACTIONAL FLOW RESERVE FLUID ANALYSIS  . EKG 12-Lead  . ECHOCARDIOGRAM COMPLETE   Meds ordered this encounter  Medications  . hydrochlorothiazide (HYDRODIURIL) 12.5 MG tablet    Sig: Take 1 tablet (12.5 mg total) by mouth daily.    Dispense:  30 tablet    Refill:  6  . metoprolol tartrate (LOPRESSOR) 50 MG tablet    Sig: Take 1 tablet (50 mg total) by mouth once for 1 dose. Take 2 hours prior to your CT scan.    Dispense:  1 tablet    Refill:  0    Patient Instructions  Medication Instructions:   1.  DECREASE hydrochlorothiazide (HYDRODIURIL): Take 1 tablet (12.5 mg total) by mouth daily  *If you need a refill on your cardiac medications before your next appointment, please call your pharmacy*   Lab Work: None Ordered If you have labs (blood work) drawn today and your tests are completely normal, you will receive your results only by: Marland Kitchen MyChart Message (if you have MyChart) OR . A paper copy in the mail If you have any lab test that is abnormal or we need to change your treatment, we will call you to review the results.   Testing/Procedures:  1.  Your physician has requested that you have an echocardiogram. Echocardiography is a painless test that uses sound waves to create images of your heart. It provides your doctor with information about the size and shape of your heart and how well your heart's chambers and valves are working. This procedure takes approximately one hour. There are no restrictions for this procedure.   2.  Your physician has requested that you have cardiac CT. Cardiac computed tomography (CT) is a painless test that uses an x-ray machine to take clear, detailed pictures of your heart.     Your cardiac CT will be scheduled at one of the below  locations:    [facility] [address] [place], [place] [address] [phone]  I If scheduled at [facility], please arrive 15 mins early for check-in and  test prep.  Please follow these instructions carefully (unless otherwise directed):   On the Night Before the Test: . Be sure to Drink plenty of water. . Do not consume any caffeinated/decaffeinated beverages or chocolate 12 hours prior to your test. . Do not take any antihistamines 12 hours prior to your test.   On the Day of the Test: . Drink plenty of water. Do not drink any water within one hour of the test. . Do not eat any food 4 hours prior to the test. . You may take your regular medications prior to the test.  . Take metoprolol (Lopressor) two hours prior to test. (pick up at your pharmacy prior to procedure) . HOLD Losartan, and your  Hydrochlorothiazide morning of the test. . FEMALES- please wear underwire-free bra if available        After the Test: . Drink plenty of water. . After receiving IV contrast, you may experience a mild flushed feeling. This is normal. . On occasion, you may experience a mild rash up to 24 hours after the test. This is not dangerous. If this occurs, you can take Benadryl 25 mg and increase your fluid intake. . If you experience trouble breathing, this can be serious. If it is severe call 911 IMMEDIATELY. If it is mild, please call our office. . If you take any of these medications: Glipizide/Metformin, Avandament, Glucavance, please do not take 48 hours after completing test unless otherwise instructed.   Once we have confirmed authorization from your insurance company, we will call you to set up a date and time for your test. Based on how quickly your insurance processes prior authorizations requests, please allow up to 4 weeks to be contacted for scheduling your Cardiac CT appointment. Be advised that routine  Cardiac CT appointments could be scheduled as many as 8 weeks after your provider has ordered it.  For non-scheduling related questions, please contact the cardiac imaging nurse navigator should you have any questions/concerns: Marchia Bond, Cardiac Imaging Nurse Navigator Burley Saver, Interim Cardiac Imaging Nurse [place] [facility] Direct Office Dial: [phone]   For scheduling needs, including cancellations and rescheduling, please call Vivien Rota at [phone], option 3.     Follow-Up: At [facility], you and your health needs are our priority.  As part of our continuing mission to provide you with exceptional heart care, we have created designated Provider Care Teams.  These Care Teams include your primary Cardiologist (physician) and Advanced Practice Providers (APPs -  Physician Assistants and Nurse Practitioners) who all work together to provide you with the care you need, when you need it.  We recommend signing up for the patient portal called "MyChart".  Sign up information is provided on this After Visit Summary.  MyChart is used to connect with patients for Virtual Visits (Telemedicine).  Patients are able to view lab/test results, encounter notes, upcoming appointments, etc.  Non-urgent messages can be sent to your provider as well.   To learn more about what you can do with MyChart, go to [email or web address].    Your next appointment:   After Echo and CTA   The format for your next appointment:   In Person  Provider:   Kate Sable, MD   Other Instructions   Echocardiogram An echocardiogram is a procedure that uses painless sound waves (ultrasound) to produce an image of the heart. Images from an echocardiogram can provide important information about:  Signs of coronary artery disease (CAD).  Aneurysm detection.  An aneurysm is a weak or damaged part of an artery wall that bulges out from the normal force of blood pumping through  the body.  Heart size and shape. Changes in the size or shape of the heart can be associated with certain conditions, including heart failure, aneurysm, and CAD.  Heart muscle function.  Heart valve function.  Signs of a past heart attack.  Fluid buildup around the heart.  Thickening of the heart muscle.  A tumor or infectious growth around the heart valves. Tell a health care provider about:  Any allergies you have.  All medicines you are taking, including vitamins, herbs, eye drops, creams, and over-the-counter medicines.  Any blood disorders you have.  Any surgeries you have had.  Any medical conditions you have.  Whether you are pregnant or may be pregnant. What are the risks? Generally, this is a safe procedure. However, problems may occur, including:  Allergic reaction to dye (contrast) that may be used during the procedure. What happens before the procedure? No specific preparation is needed. You may eat and drink normally. What happens during the procedure?   An IV tube may be inserted into one of your veins.  You may receive contrast through this tube. A contrast is an injection that improves the quality of the pictures from your heart.  A gel will be applied to your chest.  A wand-like tool (transducer) will be moved over your chest. The gel will help to transmit the sound waves from the transducer.  The sound waves will harmlessly bounce off of your heart to allow the heart images to be captured in real-time motion. The images will be recorded on a computer. The procedure may vary among health care providers and hospitals. What happens after the procedure?  You may return to your normal, everyday life, including diet, activities, and medicines, unless your health care provider tells you not to do that. Summary  An echocardiogram is a procedure that uses painless sound waves (ultrasound) to produce an image of the heart.  Images from an echocardiogram  can provide important information about the size and shape of your heart, heart muscle function, heart valve function, and fluid buildup around your heart.  You do not need to do anything to prepare before this procedure. You may eat and drink normally.  After the echocardiogram is completed, you may return to your normal, everyday life, unless your health care provider tells you not to do that. This information is not intended to replace advice given to you by your health care provider. Make sure you discuss any questions you have with your health care provider. Document Revised: 08/13/2018 Document Reviewed: 05/25/2016 Elsevier Patient Education  2020 [place], Kate Sable, MD  12/06/2019 12:46 PM    [place]

## 2019-12-06 NOTE — Patient Instructions (Signed)
Medication Instructions:   1.  DECREASE hydrochlorothiazide (HYDRODIURIL): Take 1 tablet (12.5 mg total) by mouth daily  *If you need a refill on your cardiac medications before your next appointment, please call your pharmacy*   Lab Work: None Ordered If you have labs (blood work) drawn today and your tests are completely normal, you will receive your results only by: Marland Kitchen MyChart Message (if you have MyChart) OR . A paper copy in the mail If you have any lab test that is abnormal or we need to change your treatment, we will call you to review the results.   Testing/Procedures:  1.  Your physician has requested that you have an echocardiogram. Echocardiography is a painless test that uses sound waves to create images of your heart. It provides your doctor with information about the size and shape of your heart and how well your heart's chambers and valves are working. This procedure takes approximately one hour. There are no restrictions for this procedure.   2.  Your physician has requested that you have cardiac CT. Cardiac computed tomography (CT) is a painless test that uses an x-ray machine to take clear, detailed pictures of your heart.     Your cardiac CT will be scheduled at one of the below locations:    [facility] [address] [place], [place] [address] [phone]  I If scheduled at [facility], please arrive 15 mins early for check-in and test prep.  Please follow these instructions carefully (unless otherwise directed):   On the Night Before the Test: . Be sure to Drink plenty of water. . Do not consume any caffeinated/decaffeinated beverages or chocolate 12 hours prior to your test. . Do not take any antihistamines 12 hours prior to your test.   On the Day of the Test: . Drink plenty of water. Do not drink any water within one hour of the test. . Do not eat any food 4 hours prior to the  test. . You may take your regular medications prior to the test.  . Take metoprolol (Lopressor) two hours prior to test. (pick up at your pharmacy prior to procedure) . HOLD Losartan, and your  Hydrochlorothiazide morning of the test. . FEMALES- please wear underwire-free bra if available        After the Test: . Drink plenty of water. . After receiving IV contrast, you may experience a mild flushed feeling. This is normal. . On occasion, you may experience a mild rash up to 24 hours after the test. This is not dangerous. If this occurs, you can take Benadryl 25 mg and increase your fluid intake. . If you experience trouble breathing, this can be serious. If it is severe call 911 IMMEDIATELY. If it is mild, please call our office. . If you take any of these medications: Glipizide/Metformin, Avandament, Glucavance, please do not take 48 hours after completing test unless otherwise instructed.   Once we have confirmed authorization from your insurance company, we will call you to set up a date and time for your test. Based on how quickly your insurance processes prior authorizations requests, please allow up to 4 weeks to be contacted for scheduling your Cardiac CT appointment. Be advised that routine Cardiac CT appointments could be scheduled as many as 8 weeks after your provider has ordered it.  For non-scheduling related questions, please contact the cardiac imaging nurse navigator should you have any questions/concerns: Marchia Bond, Cardiac Imaging Nurse Navigator Kerin Ransom  Tai, Interim Cardiac Imaging Nurse Navigator [facility] Direct Office Dial: [phone]   For scheduling needs, including cancellations and rescheduling, please call Vivien Rota at [phone], option [phone].     Follow-Up: At [facility], you and your health needs are our priority.  As part of our continuing mission to provide you with exceptional heart care, we have created designated Provider  Care Teams.  These Care Teams include your primary Cardiologist (physician) and Advanced Practice Providers (APPs -  Physician Assistants and Nurse Practitioners) who all work together to provide you with the care you need, when you need it.  We recommend signing up for the patient portal called "MyChart".  Sign up information is provided on this After Visit Summary.  MyChart is used to connect with patients for Virtual Visits (Telemedicine).  Patients are able to view lab/test results, encounter notes, upcoming appointments, etc.  Non-urgent messages can be sent to your provider as well.   To learn more about what you can do with MyChart, go to [email or web address].    Your next appointment:   After Echo and CTA   The format for your next appointment:   In Person  Provider:   Kate Sable, MD   Other Instructions   Echocardiogram An echocardiogram is a procedure that uses painless sound waves (ultrasound) to produce an image of the heart. Images from an echocardiogram can provide important information about:  Signs of coronary artery disease (CAD).  Aneurysm detection. An aneurysm is a weak or damaged part of an artery wall that bulges out from the normal force of blood pumping through the body.  Heart size and shape. Changes in the size or shape of the heart can be associated with certain conditions, including heart failure, aneurysm, and CAD.  Heart muscle function.  Heart valve function.  Signs of a past heart attack.  Fluid buildup around the heart.  Thickening of the heart muscle.  A tumor or infectious growth around the heart valves. Tell a health care provider about:  Any allergies you have.  All medicines you are taking, including vitamins, herbs, eye drops, creams, and over-the-counter medicines.  Any blood disorders you have.  Any surgeries you have had.  Any medical conditions you have.  Whether you are pregnant or may be pregnant. What are the  risks? Generally, this is a safe procedure. However, problems may occur, including:  Allergic reaction to dye (contrast) that may be used during the procedure. What happens before the procedure? No specific preparation is needed. You may eat and drink normally. What happens during the procedure?   An IV tube may be inserted into one of your veins.  You may receive contrast through this tube. A contrast is an injection that improves the quality of the pictures from your heart.  A gel will be applied to your chest.  A wand-like tool (transducer) will be moved over your chest. The gel will help to transmit the sound waves from the transducer.  The sound waves will harmlessly bounce off of your heart to allow the heart images to be captured in real-time motion. The images will be recorded on a computer. The procedure may vary among health care providers and hospitals. What happens after the procedure?  You may return to your normal, everyday life, including diet, activities, and medicines, unless your health care provider tells you not to do that. Summary  An echocardiogram is a procedure that uses painless sound waves (ultrasound) to produce an  image of the heart.  Images from an echocardiogram can provide important information about the size and shape of your heart, heart muscle function, heart valve function, and fluid buildup around your heart.  You do not need to do anything to prepare before this procedure. You may eat and drink normally.  After the echocardiogram is completed, you may return to your normal, everyday life, unless your health care provider tells you not to do that. This information is not intended to replace advice given to you by your health care provider. Make sure you discuss any questions you have with your health care provider. Document Revised: 08/13/2018 Document Reviewed: 05/25/2016 Elsevier Patient Education  [place].

## 2019-12-08 ENCOUNTER — Ambulatory Visit (INDEPENDENT_AMBULATORY_CARE_PROVIDER_SITE_OTHER): Payer: BC Managed Care – PPO | Admitting: Obstetrics and Gynecology

## 2019-12-08 ENCOUNTER — Other Ambulatory Visit (HOSPITAL_COMMUNITY)
Admission: RE | Admit: 2019-12-08 | Discharge: 2019-12-08 | Disposition: A | Discharge status: Home / Self Care | Payer: BC Managed Care – PPO | Source: Ambulatory Visit | Attending: Obstetrics and Gynecology | Admitting: Obstetrics and Gynecology

## 2019-12-08 ENCOUNTER — Encounter: Payer: Self-pay | Admitting: Obstetrics and Gynecology

## 2019-12-08 VITALS — BP 112/79 | HR 59 | Ht 68.0 in | Wt 189.7 lb

## 2019-12-08 DIAGNOSIS — N6011 Diffuse cystic mastopathy of right breast: Secondary | ICD-10-CM | POA: Diagnosis not present

## 2019-12-08 DIAGNOSIS — Z01419 Encounter for gynecological examination (general) (routine) without abnormal findings: Secondary | ICD-10-CM

## 2019-12-08 DIAGNOSIS — Z124 Encounter for screening for malignant neoplasm of cervix: Secondary | ICD-10-CM | POA: Diagnosis not present

## 2019-12-08 DIAGNOSIS — N6012 Diffuse cystic mastopathy of left breast: Secondary | ICD-10-CM | POA: Diagnosis not present

## 2019-12-08 DIAGNOSIS — Z9889 Other specified postprocedural states: Secondary | ICD-10-CM

## 2019-12-08 NOTE — Addendum Note (Signed)
Addended by: Dorian Pod on: 12/08/2019 09:09 AM   Modules accepted: Orders

## 2019-12-08 NOTE — Progress Notes (Signed)
HPI:      [name] is a 49 y.o. [phone] who LMP was No LMP recorded. Patient has had an ablation.  Subjective:   She presents today for her annual examination.  She has previously had an endometrial ablation for heavy bleeding and it has worked well for her.  She is no longer having any bleeding.  She does occasionally have cyclic breast changes where she gets breast tenderness sometimes 1 week/month.  Previously she was noted to have fibrocystic breast change on mammography.  She is not currently using anything for birth control and has not used any birth control since her ablation.  She is not interested in starting birth control despite knowing the recommendations associated with ablation.    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 She does not have any pertinent problems on file. She  has a past surgical history that includes Cholecystectomy (N/A, 01/19/2016); laser surgery on eye (Right, 10/2016); and Dilatation & currettage/hysteroscopy with novasure ablation (N/A, 03/24/2017). Her family history includes Cancer in her father, paternal grandfather, and paternal uncle; Diabetes in her brother, maternal grandmother, sister, and another family member; Hypertension in her father and mother; Kidney disease in her mother; Schizophrenia in her sister; Thyroid  disease in her mother. She  reports that she has never smoked. She has never used smokeless tobacco. She reports that she does not drink alcohol and does not use drugs. She has a current medication list which includes the following prescription(s): albuterol, hydrochlorothiazide, losartan, pantoprazole, vitamin d, and metoprolol tartrate. She is allergic to contrave [naltrexone-bupropion hcl er].       Review of Systems:  Review of Systems  Constitutional: Denied constitutional symptoms, night sweats, recent illness, fatigue, fever, insomnia and weight loss.  Eyes: Denied eye symptoms, eye pain, photophobia, vision change and visual disturbance.  Ears/Nose/Throat/Neck: Denied ear, nose, throat or neck symptoms, hearing loss, nasal discharge, sinus congestion and sore throat.  Cardiovascular: Denied cardiovascular symptoms, arrhythmia, chest pain/pressure, edema, exercise intolerance, orthopnea and palpitations.  Respiratory: Denied pulmonary symptoms, asthma, pleuritic pain, productive sputum, cough, dyspnea and wheezing.  Gastrointestinal: Denied, gastro-esophageal reflux, melena, nausea and vomiting.  Genitourinary: See HPI for additional information.  Musculoskeletal: Denied musculoskeletal symptoms, stiffness, swelling, muscle weakness and myalgia.  Dermatologic: Denied dermatology symptoms, rash and scar.  Neurologic: Denied neurology symptoms, dizziness, headache, neck pain and syncope.  Psychiatric: Denied psychiatric symptoms, anxiety and depression.  Endocrine: Denied endocrine symptoms including hot flashes and night sweats.   Meds:   Current Outpatient Medications on File Prior to Visit  Medication Sig Dispense Refill   albuterol (VENTOLIN HFA) 108 (90 Base) MCG/ACT inhaler Inhale 1-2 puffs into the lungs every 4 (four) hours as needed for wheezing or shortness of breath. 6.7 g 1   hydrochlorothiazide (HYDRODIURIL) 12.5 MG tablet Take 1 tablet (12.5 mg total) by mouth daily. 30  tablet 6   losartan (COZAAR) 100 MG tablet Take 1 tablet (100 mg total) by mouth daily. 30 tablet 6  pantoprazole (PROTONIX) 40 MG tablet Take 40 mg by mouth 2 (two) times daily.     VITAMIN D PO Take by mouth once a week.      metoprolol tartrate (LOPRESSOR) 50 MG tablet Take 1 tablet (50 mg total) by mouth once for 1 dose. Take 2 hours prior to your CT scan. 1 tablet 0   No current facility-administered medications on file prior to visit.    Objective:     Vitals:   12/08/19 0814  BP: 112/79  Pulse: (!) 59              Physical examination General NAD, Conversant  HEENT Atraumatic; Op clear with mmm.  Normo-cephalic. Pupils reactive. Anicteric sclerae  Thyroid/Neck Smooth without nodularity or enlargement. Normal ROM.  Neck Supple.  Skin No rashes, lesions or ulceration. Normal palpated skin turgor. No nodularity.  Breasts: No masses or discharge.  Symmetric.  No axillary adenopathy.   Lungs: Clear to auscultation.No rales or wheezes. Normal Respiratory effort, no retractions.  Heart: NSR.  No murmurs or rubs appreciated. No periferal edema  Abdomen: Soft.  Non-tender.  No masses.  No HSM. No hernia  Extremities: Moves all appropriately.  Normal ROM for age. No lymphadenopathy.  Neuro: Oriented to PPT.  Normal mood. Normal affect.     Pelvic:   Vulva: Normal appearance.  No lesions.  Vagina: No lesions or abnormalities noted.  Support: Normal pelvic support.  Urethra No masses tenderness or scarring.  Meatus Normal size without lesions or prolapse.  Cervix: Normal appearance.  No lesions.  Anus: Normal exam.  No lesions.  Perineum: Normal exam.  No lesions.        Bimanual   Uterus: Normal size.  Non-tender.  Mobile.  AV.  Adnexae: No masses.  Non-tender to palpation.  Cul-de-sac: Negative for abnormality.      Assessment:    [id number] Patient Active Problem List   Diagnosis Date Noted   Hypertension, benign 12/03/2019   Fibrocystic breast changes,  bilateral 12/03/2019   Mild episode of recurrent major depressive disorder (HCC) 03/26/2019   Obstructive sleep apnea 12/30/2018   Absolute anemia 01/09/2018   Vitamin B12 deficiency 01/08/2018   Vitamin D deficiency 01/08/2018   Positive ANA (antinuclear antibody) 12/15/2017   Primary osteoarthritis of right knee 05/13/2017   Overweight (BMI 25.0-29.9) 11/19/2016   White coat hypertension 01/25/2016   Dysmenorrhea 01/03/2016   High cholesterol 01/01/2016   History of ectopic pregnancy 11/29/2015   Abnormal thyroid stimulating hormone (TSH) level 01/10/2015   Allergic rhinitis, seasonal 01/10/2015   Asymptomatic varicose veins of both lower extremities 01/10/2015     1. Well woman exam with routine gynecological exam   2. History of endometrial ablation   3. Fibrocystic breast changes of both breasts     Endometrial ablation-no issues  Some fibrocystic breast changes noted patient occasionally symptomatic.   Plan:            1.  Basic Screening Recommendations The basic screening recommendations for asymptomatic women were discussed with the patient during her visit.  The age-appropriate recommendations were discussed with her and the rational for the tests reviewed.  When I am informed by the patient that another primary care physician has previously obtained the age-appropriate tests and they are up-to-date, only outstanding tests are ordered and referrals given as necessary.  Abnormal results of tests will be discussed with her when all of her results are completed.  Routine preventative health maintenance measures emphasized: Exercise/Diet/Weight control,  Tobacco Warnings, Alcohol/Substance use risks and Stress Management Pap performed -mammogram scheduled for next week 2.  Fibrocystic breast changes discussed in detail decrease caffeine red wine chocolate etc. 3.  Patient declines birth control Orders No orders of the defined types were placed in this  encounter.   No orders of the defined types were placed in this encounter.           F/U  Return in about 1 year (around 12/07/2020) for Annual Physical.  Elonda Husky, M.D. 12/08/2019 8:40 AM

## 2019-12-10 ENCOUNTER — Encounter: Payer: BC Managed Care – PPO | Admitting: Obstetrics and Gynecology

## 2019-12-10 LAB — CYTOLOGY - PAP
Comment: NEGATIVE
Diagnosis: NEGATIVE
High risk HPV: NEGATIVE

## 2019-12-13 ENCOUNTER — Ambulatory Visit
Admission: RE | Admit: 2019-12-13 | Discharge: 2019-12-13 | Disposition: A | Discharge status: Home / Self Care | Payer: BC Managed Care – PPO | Source: Ambulatory Visit | Attending: Family Medicine | Admitting: Family Medicine

## 2019-12-13 ENCOUNTER — Ambulatory Visit: Payer: BC Managed Care – PPO | Attending: Critical Care Medicine

## 2019-12-13 ENCOUNTER — Ambulatory Visit: Payer: Self-pay

## 2019-12-13 ENCOUNTER — Other Ambulatory Visit: Payer: Self-pay

## 2019-12-13 DIAGNOSIS — N644 Mastodynia: Secondary | ICD-10-CM | POA: Diagnosis not present

## 2019-12-13 DIAGNOSIS — N6315 Unspecified lump in the right breast, overlapping quadrants: Secondary | ICD-10-CM | POA: Diagnosis not present

## 2019-12-13 DIAGNOSIS — Z23 Encounter for immunization: Secondary | ICD-10-CM

## 2019-12-13 DIAGNOSIS — R922 Inconclusive mammogram: Secondary | ICD-10-CM | POA: Diagnosis not present

## 2019-12-13 NOTE — Progress Notes (Signed)
   Covid-19 Vaccination [facility]  Name:  Andrea Pope    MRN: [id number] DOB: 1970-07-10  12/13/2019  [name] was observed post Covid-19 immunization for 15 minutes without incident. She was provided with Vaccine Information Sheet and instruction to access the V-Safe system.   [name] was instructed to call 911 with any severe reactions post vaccine: Marland Kitchen Difficulty breathing  . Swelling of face and throat  . A fast heartbeat  . A bad rash all over body  . Dizziness and weakness   Immunizations Administered    Name Date Dose VIS Date Route   Pfizer COVID-19 Vaccine 12/13/2019  4:47 PM 0.3 mL 06/30/2018 Intramuscular   Manufacturer: [employer], [employer]   Lot: [id number]   NDC: [address]-3

## 2019-12-14 DIAGNOSIS — G4733 Obstructive sleep apnea (adult) (pediatric): Secondary | ICD-10-CM | POA: Diagnosis not present

## 2019-12-15 DIAGNOSIS — Z03818 Encounter for observation for suspected exposure to other biological agents ruled out: Secondary | ICD-10-CM | POA: Diagnosis not present

## 2019-12-15 DIAGNOSIS — Z1152 Encounter for screening for COVID-19: Secondary | ICD-10-CM | POA: Diagnosis not present

## 2019-12-22 ENCOUNTER — Other Ambulatory Visit: Payer: Self-pay | Admitting: Cardiology

## 2019-12-22 DIAGNOSIS — R072 Precordial pain: Secondary | ICD-10-CM

## 2019-12-22 DIAGNOSIS — R0609 Other forms of dyspnea: Secondary | ICD-10-CM

## 2019-12-22 DIAGNOSIS — R079 Chest pain, unspecified: Secondary | ICD-10-CM

## 2019-12-23 ENCOUNTER — Ambulatory Visit (INDEPENDENT_AMBULATORY_CARE_PROVIDER_SITE_OTHER): Payer: BC Managed Care – PPO | Admitting: Family Medicine

## 2019-12-23 ENCOUNTER — Other Ambulatory Visit: Payer: Self-pay

## 2019-12-23 ENCOUNTER — Encounter: Payer: Self-pay | Admitting: Family Medicine

## 2019-12-23 VITALS — BP 110/68 | HR 83 | Temp 98.0°F | Resp 14 | Ht 68.0 in | Wt 188.3 lb

## 2019-12-23 DIAGNOSIS — Z0000 Encounter for general adult medical examination without abnormal findings: Secondary | ICD-10-CM

## 2019-12-23 DIAGNOSIS — F419 Anxiety disorder, unspecified: Secondary | ICD-10-CM

## 2019-12-23 DIAGNOSIS — I10 Essential (primary) hypertension: Secondary | ICD-10-CM | POA: Diagnosis not present

## 2019-12-23 DIAGNOSIS — Z1211 Encounter for screening for malignant neoplasm of colon: Secondary | ICD-10-CM

## 2019-12-23 DIAGNOSIS — Z1159 Encounter for screening for other viral diseases: Secondary | ICD-10-CM

## 2019-12-23 DIAGNOSIS — G4733 Obstructive sleep apnea (adult) (pediatric): Secondary | ICD-10-CM

## 2019-12-23 DIAGNOSIS — E876 Hypokalemia: Secondary | ICD-10-CM

## 2019-12-23 DIAGNOSIS — R7989 Other specified abnormal findings of blood chemistry: Secondary | ICD-10-CM

## 2019-12-23 DIAGNOSIS — E7800 Pure hypercholesterolemia, unspecified: Secondary | ICD-10-CM

## 2019-12-23 MED ORDER — BUSPIRONE HCL 5 MG PO TABS: 5.0000 mg | ORAL_TABLET | Freq: Two times a day (BID) | ORAL | 1 refills | Status: DC | PRN

## 2019-12-23 NOTE — Progress Notes (Signed)
Patient: Andrea Pope, Female    DOB: 1971/01/25, 49 y.o.   MRN: [id number] Delsa Grana, PA-C Visit Date: 12/23/2019  Today's Provider: Delsa Grana, PA-C   Chief Complaint  Patient presents with   Annual Exam    no pap   Subjective:   Annual physical exam:  Andrea Pope is a 49 y.o. female who presents today for complete physical exam:  Exercise/Activity:  Walks 15-39mns 3x a week  Diet/nutrition:  healthy diet - lowered her salt intake, fruits and veggies every day   Sleep:  8-10 hrs per night  (when she goes back to work she will sleep more like 8 hours) CPAP good compliance   Pt wished to discuss acute complaints still having SOB do routine f/up on chronic conditions today in addition to CPE. Advised pt of separate visit billing/coding  USPSTF grade A and B recommendations - reviewed and addressed today  Depression:  Phq 9 completed today by patient, was reviewed by me with patient in the room PHQ score is 1, pt feels good - her anxiety is more of a problem - still anxious, having attacks, doesn't know what is triggers are - hasn't seen psych or a therapist - willing to try meds, zoloft in the past she did not like PHQ 2/9 Scores 12/23/2019 12/23/2019 12/03/2019 10/28/2019  PHQ - 2 Score 0 0 0 0  PHQ- 9 Score 1 0 0 0   [facility] 12/23/2019 12/23/2019 12/03/2019 10/28/2019 09/07/2019  Decreased Interest 0 0 0 0 0  Down, Depressed, Hopeless 0 0 0 0 0  PHQ - 2 Score 0 0 0 0 0  Altered sleeping 0 0 0 0 0  Tired, decreased energy 1 0 0 0 0  Change in appetite 0 0 0 0 0  Feeling bad or failure about yourself  0 0 0 0 0  Trouble concentrating 0 0 0 0 0  Moving slowly or fidgety/restless 0 0 0 0 0  Suicidal thoughts 0 0 0 0 0  PHQ-9 Score 1 0 0 0 0  Difficult doing work/chores Not difficult at all Not difficult at all - Not difficult at all Not difficult at all  Some recent data might be hidden    Alcohol screening:   [facility]      Immunizations and Health Maintenance: Health Maintenance  Topic Date Due   Hepatitis C Screening  Never done   INFLUENZA VACCINE  12/05/2019   COVID-19 Vaccine (2 - Pfizer 2-dose series) 01/03/2020   PAP SMEAR-Modifier  12/08/2022   TETANUS/TDAP  12/06/2027   HIV Screening  Completed     Hep C Screening: ordered   STD testing and prevention (HIV/chl/gon/syphilis):  see above, no additional testing desired by pt today - None needed  Intimate partner violence:  No  Sexual History/Pain during Intercourse:  Married  Menstrual History/LMP/Abnormal Bleeding:   No concerns  No LMP recorded. Patient has had an ablation.  Incontinence Symptoms:  NO  Breast cancer:  Last Mammogram: 12/13/19 see HM list above BRCA gene screening: N/A  Cervical cancer screening: 12/08/19 negative/normal Pt does not have family hx of cancers - breast, ovarian, uterine, colon  Osteoporosis:N/A  Discussion on osteoporosis per age, including high calcium and vitamin D supplementation, weight bearing exercises  Menopause?   Skin cancer:  Hx of skin CA -  NO Discussed atypical lesions   Colorectal cancer:  Colonoscopy: ordered - indicated for age - willing to do colonoscopy Discussed concerning signs and sx of CRC, pt denies melena hematochezia    Lung cancer: N/A  Low Dose CT Chest recommended if Age 46-80 years, 30 pack-year currently smoking OR have quit w/in 15years. Patient does not qualify.    Social History   Tobacco Use   Smoking status: Never Smoker   Smokeless tobacco: Never Used  Vaping Use   Vaping Use: Never used  Substance Use Topics   Alcohol use: No    Alcohol/week: 0.0 standard drinks   Drug use: No       Office Visit from 12/23/2019 in [facility]  AUDIT-C Score 0      Family History  Problem Relation Age of Onset   Hypertension Mother    Thyroid disease Mother    Kidney  disease Mother    Hypertension Father    Cancer Father        Prostate   Diabetes Brother    Diabetes Other    Cancer Paternal Uncle        Prostate   Diabetes Maternal Grandmother    Cancer Paternal Grandfather        Prostate   Diabetes Sister    Schizophrenia Sister    Breast cancer Neg Hx    Ovarian cancer Neg Hx    Heart disease Neg Hx      Blood pressure/Hypertension: BP Readings from Last 10 Encounters:  12/23/19 110/68  12/08/19 112/79  12/06/19 (!) 104/60  12/03/19 100/70  11/10/19 (!) 153/95  11/10/19 133/85  11/05/19 (!) 142/90  10/28/19 (!) 142/100  09/27/19 136/88  09/27/19 (!) 143/95   Weight/Obesity: Wt Readings from Last 3 Encounters:  12/23/19 188 lb 4.8 oz (85.4 kg)  12/08/19 189 lb 11.2 oz (86 kg)  12/06/19 187 lb (84.8 kg)   BMI Readings from Last 3 Encounters:  12/23/19 28.63 kg/m  12/08/19 28.84 kg/m  12/06/19 28.43 kg/m    Lipids:  Lab Results  Component Value Date   CHOL 188 12/30/2018   CHOL 230 (H) 04/28/2018   CHOL 236 (H) 12/05/2017   Lab Results  Component Value Date   HDL 61 12/30/2018   HDL 59 04/28/2018   HDL 64 12/05/2017   Lab Results  Component Value Date   LDLCALC 103 (H) 12/30/2018   LDLCALC 153 (H) 04/28/2018   LDLCALC 158 (H) 12/05/2017   Lab Results  Component Value Date   TRIG 144 12/30/2018   TRIG 78 04/28/2018   TRIG 51 12/05/2017   Lab Results  Component Value Date   CHOLHDL 3.1 12/30/2018   CHOLHDL 3.9 04/28/2018   CHOLHDL 3.7 12/05/2017   No results found for: LDLDIRECT Based on the results of lipid panel his/her cardiovascular risk factor ( using [employer] )  in the next 10 years is: The 10-year ASCVD risk score Mikey Bussing DC Brooke Bonito., et al., 2013) is: 1.1%   Values used to calculate the score:     Age: 77 years     Sex: Female     Is Non-Hispanic African American: Yes     Diabetic: No     Tobacco smoker: No     Systolic Blood Pressure: 119 mmHg     Is BP treated: Yes     HDL  Cholesterol: 61 mg/dL     Total Cholesterol: 188 mg/dL  Glucose:  Glucose, Bld  Date Value Ref Range Status  11/10/2019 89 70 - 99  mg/dL Final    Comment:    Glucose reference range applies only to samples taken after fasting for at least 8 hours.  09/26/2019 121 (H) 70 - 99 mg/dL Final    Comment:    Glucose reference range applies only to samples taken after fasting for at least 8 hours.  09/07/2019 94 65 - 99 mg/dL Final    Comment:    .            Fasting reference interval .    Hypertension: BP Readings from Last 3 Encounters:  12/23/19 110/68  12/08/19 112/79  12/06/19 (!) 104/60   Obesity: Wt Readings from Last 3 Encounters:  12/23/19 188 lb 4.8 oz (85.4 kg)  12/08/19 189 lb 11.2 oz (86 kg)  12/06/19 187 lb (84.8 kg)   BMI Readings from Last 3 Encounters:  12/23/19 28.63 kg/m  12/08/19 28.84 kg/m  12/06/19 28.43 kg/m      Advanced Care Planning:  A voluntary discussion about advance care planning including the explanation and discussion of advance directives.   Discussed health care proxy and Living will, and the patient was able to identify a health care proxy as Jamara Vary (husband).   Patient does not have a living will at present time.   Social History      She        Social History   Socioeconomic History   Marital status: Married    Spouse name: Ron    Number of children: 4   Years of education: Not on file   Highest education level: Bachelor's degree (e.g., BA, AB, BS)  Occupational History   Occupation: Pharmacist, hospital  Tobacco Use   Smoking status: Never Smoker   Smokeless tobacco: Never Used  Vaping Use   Vaping Use: Never used  Substance and Sexual Activity   Alcohol use: No    Alcohol/week: 0.0 standard drinks   Drug use: No   Sexual activity: Yes    Partners: Male    Birth control/protection: None  Other Topics Concern   Not on file  Social History Narrative   Not on file   Social Determinants of Health    Financial Resource Strain:    Difficulty of Paying Living Expenses: Not on file  Food Insecurity:    Worried About Charity fundraiser in the Last Year: Not on file   [employer] of Food in the Last Year: Not on file  Transportation Needs:    Lack of Transportation (Medical): Not on file   Lack of Transportation (Non-Medical): Not on file  Physical Activity:    Days of Exercise per Week: Not on file   Minutes of Exercise per Session: Not on file  Stress:    Feeling of Stress : Not on file  Social Connections:    Frequency of Communication with Friends and Family: Not on file   Frequency of Social Gatherings with Friends and Family: Not on file   Attends Religious Services: Not on file   Active Member of Clubs or Organizations: Not on file   Attends Archivist Meetings: Not on file   Marital Status: Not on file    Family History        Family History  Problem Relation Age of Onset   Hypertension Mother    Thyroid disease Mother    Kidney disease Mother    Hypertension Father    Cancer Father        Prostate   Diabetes Brother  Diabetes Other    Cancer Paternal Uncle        Prostate   Diabetes Maternal Grandmother    Cancer Paternal Grandfather        Prostate   Diabetes Sister    Schizophrenia Sister    Breast cancer Neg Hx    Ovarian cancer Neg Hx    Heart disease Neg Hx     Patient Active Problem List   Diagnosis Date Noted   Hypertension, benign 12/03/2019   Fibrocystic breast changes, bilateral 12/03/2019   Mild episode of recurrent major depressive disorder ([place]) 03/26/2019   Obstructive sleep apnea 12/30/2018   Absolute anemia 01/09/2018   Vitamin B12 deficiency 01/08/2018   Vitamin D deficiency 01/08/2018   Positive ANA (antinuclear antibody) 12/15/2017   Primary osteoarthritis of right knee 05/13/2017   Overweight (BMI 25.0-29.9) 11/19/2016   White coat hypertension 01/25/2016   Dysmenorrhea  01/03/2016   High cholesterol 01/01/2016   History of ectopic pregnancy 11/29/2015   Abnormal thyroid stimulating hormone (TSH) level 01/10/2015   Allergic rhinitis, seasonal 01/10/2015   Asymptomatic varicose veins of both lower extremities 01/10/2015    Past Surgical History:  Procedure Laterality Date   CHOLECYSTECTOMY N/A 01/19/2016   Procedure: LAPAROSCOPIC CHOLECYSTECTOMY WITH INTRAOPERATIVE CHOLANGIOGRAM;  Surgeon: Christene Lye, MD;  Location: [facility] ORS;  Service: General;  Laterality: N/A;   [place] N/A 03/24/2017   Procedure: DILATATION & CURETTAGE/HYSTEROSCOPY WITH NOVASURE ABLATION;  Surgeon: Brayton Mars, MD;  Location: [facility] ORS;  Service: Gynecology;  Laterality: N/A;   laser surgery on eye Right 10/2016     Current Outpatient Medications:    hydrochlorothiazide (HYDRODIURIL) 12.5 MG tablet, Take 1 tablet (12.5 mg total) by mouth daily., Disp: 30 tablet, Rfl: 6   losartan (COZAAR) 100 MG tablet, Take 1 tablet (100 mg total) by mouth daily., Disp: 30 tablet, Rfl: 6   pantoprazole (PROTONIX) 40 MG tablet, Take 40 mg by mouth 2 (two) times daily., Disp: , Rfl:    VITAMIN D PO, Take by mouth once a week. , Disp: , Rfl:    busPIRone (BUSPAR) 5 MG tablet, Take 1-2 tablets (5-10 mg total) by mouth 2 (two) times daily as needed (for anxiety/stress symptoms)., Disp: 120 tablet, Rfl: 1   metoprolol tartrate (LOPRESSOR) 50 MG tablet, Take 1 tablet (50 mg total) by mouth once for 1 dose. Take 2 hours prior to your CT scan., Disp: 1 tablet, Rfl: 0  Allergies  Allergen Reactions   Contrave [Naltrexone-Bupropion Hcl Er]     Palpitations, hypertension    Patient Care Team: Delsa Grana, PA-C as PCP - General (Family Medicine) Kate Sable, MD as PCP - Cardiology (Cardiology) Lada, Satira Anis, MD as Attending Physician (Family Medicine) Christene Lye, MD (General Surgery)  Review of  Systems  Constitutional: Negative.  Negative for activity change, appetite change, fatigue and unexpected weight change.  HENT: Negative.   Eyes: Negative.   Respiratory: Negative.  Negative for shortness of breath.   Cardiovascular: Negative.  Negative for chest pain, palpitations and leg swelling.  Gastrointestinal: Negative.  Negative for abdominal pain and blood in stool.  Endocrine: Negative.   Genitourinary: Negative.   Musculoskeletal: Negative.  Negative for arthralgias, gait problem, joint swelling and myalgias.  Skin: Negative.  Negative for pallor and rash.  Allergic/Immunologic: Negative.   Neurological: Negative.  Negative for syncope and weakness.  Hematological: Negative.   Psychiatric/Behavioral: Negative for dysphoric mood, self-injury and suicidal ideas. The patient is  nervous/anxious.   All other systems reviewed and are negative.   I personally reviewed active problem list, medication list, allergies, family history, social history, health maintenance, notes from last encounter, lab results, imaging with the patient/caregiver today.        Objective:   Vitals:  Vitals:   12/23/19 0917  BP: 110/68  Pulse: 83  Resp: 14  Temp: 98 F (36.7 C)  SpO2: 98%  Weight: 188 lb 4.8 oz (85.4 kg)  Height: _0  (1.727 m)    Body mass index is 28.63 kg/m.  Physical Exam Vitals and nursing note reviewed.  Constitutional:      General: She is not in acute distress.    Appearance: Normal appearance. She is well-developed. She is not ill-appearing, toxic-appearing or diaphoretic.     Interventions: Face mask in place.  HENT:     Head: Normocephalic and atraumatic.     Right Ear: External ear normal.     Left Ear: External ear normal.  Eyes:     General: Lids are normal. No scleral icterus.       Right eye: No discharge.        Left eye: No discharge.     Conjunctiva/sclera: Conjunctivae normal.  Neck:     Trachea: Phonation normal. No tracheal deviation.   Cardiovascular:     Rate and Rhythm: Normal rate and regular rhythm.     Pulses: Normal pulses.          Radial pulses are 2+ on the right side and 2+ on the left side.       Posterior tibial pulses are 2+ on the right side and 2+ on the left side.     Heart sounds: Normal heart sounds. No murmur heard.  No friction rub. No gallop.   Pulmonary:     Effort: Pulmonary effort is normal. No respiratory distress.     Breath sounds: Normal breath sounds. No stridor. No wheezing, rhonchi or rales.  Chest:     Chest wall: No tenderness.  Abdominal:     General: Bowel sounds are normal. There is no distension.     Palpations: Abdomen is soft.  Musculoskeletal:     Cervical back: Normal range of motion and neck supple.     Right lower leg: No edema.     Left lower leg: No edema.  Lymphadenopathy:     Cervical: No cervical adenopathy.  Skin:    General: Skin is warm and dry.     Capillary Refill: Capillary refill takes less than 2 seconds.     Coloration: Skin is not jaundiced or pale.     Findings: No rash.  Neurological:     Mental Status: She is alert.     Motor: No abnormal muscle tone.     Gait: Gait normal.  Psychiatric:        Attention and Perception: Attention normal.        Mood and Affect: Affect normal. Mood is anxious.        Speech: Speech normal.        Behavior: Behavior normal. Behavior is cooperative.        Thought Content: Thought content normal.    Fall Risk: Fall Risk  12/23/2019 12/03/2019 10/28/2019 09/07/2019 07/06/2019  Falls in the past year? 0 0 0 0 0  Number falls in past yr: 0 0 0 0 0  Injury with Fall? 0 0 0 0 0  Follow up - - - - -  Functional Status Survey:     Assessment & Plan:    CPE completed today   USPSTF grade A and B recommendations reviewed with patient; age-appropriate recommendations, preventive care, screening tests, etc discussed and encouraged; healthy living encouraged; see AVS for patient education given to  patient   Discussed importance of 150 minutes of physical activity weekly, AHA exercise recommendations given to pt in AVS/handout   Discussed importance of healthy diet:  eating lean meats and proteins, avoiding trans fats and saturated fats, avoid simple sugars and excessive carbs in diet, eat 6 servings of fruit/vegetables daily and drink plenty of water and avoid sweet beverages.     Recommended pt to do annual eye exam and routine dental exams/cleanings   Depression, alcohol, fall screening completed as documented above and per flowsheets   Reviewed Health Maintenance: Health Maintenance  Topic Date Due   Hepatitis C Screening  Never done   INFLUENZA VACCINE  12/05/2019   COVID-19 Vaccine (2 - Pfizer 2-dose series) 01/03/2020   PAP SMEAR-Modifier  12/08/2022   TETANUS/TDAP  12/06/2027   HIV Screening  Completed     Immunizations: Immunization History  Administered Date(s) Administered   Influenza,inj,Quad PF,6+ Mos 01/25/2016, 12/26/2017, 02/24/2019   Influenza-Unspecified 03/07/2017   PFIZER SARS-COV-2 Vaccination 12/13/2019   PPD Test 11/20/2015   Tdap 12/05/2017       ICD-10-CM   1. Annual physical exam  Z00.00 Lipid panel    COMPLETE METABOLIC PANEL WITH GFR    Hepatitis C Antibody    Lipid panel    TSH    T4, free  2. Encounter for hepatitis C screening test for low risk patient  Z11.59 Hepatitis C Antibody  3. Obstructive sleep apnea  G47.33    compliant with CPAP  4. Hypertension, benign  B20 COMPLETE METABOLIC PANEL WITH GFR   BP well controlled on losartan 100 and HCTZ 12.5, no lightheadedness/syncope, SE or concerns  5. High cholesterol  F00.71 COMPLETE METABOLIC PANEL WITH GFR    Lipid panel   high for several years, improved last year significantly, discussed ASCVD risk score, continue healthy diet/lifestyle/exercise  6. Screening for malignant neoplasm of colon  Z12.11 Ambulatory referral to Gastroenterology   low risk, no current sx,  GI ref for colonoscopy  7. Anxiety disorder, unspecified type  F41.9 busPIRone (BUSPAR) 5 MG tablet    Ambulatory referral to Psychiatry   still frequent anxiety sx, panic attacks? still using xanax prn, trial of buspar prn, she is afraid she doesnt know whats causing attacks - agrees to psych ref  8. Abnormal TSH  R79.89 TSH    T4, free   recheck labs, still having sx that could possibly be due to thyroid dysfunction  9. Hypokalemia  Q19.7 COMPLETE METABOLIC PANEL WITH GFR         Delsa Grana, PA-C 12/23/19 10:23 AM  [facility]

## 2019-12-23 NOTE — Patient Instructions (Signed)
Preventive Care 40-47 Years Old, Female °Preventive care refers to visits with your health care provider and lifestyle choices that can promote health and wellness. This includes: °· A yearly physical exam. This may also be called an annual well check. °· Regular dental visits and eye exams. °· Immunizations. °· Screening for certain conditions. °· Healthy lifestyle choices, such as eating a healthy diet, getting regular exercise, not using drugs or products that contain nicotine and tobacco, and limiting alcohol use. °What can I expect for my preventive care visit? °Physical exam °Your health care provider will check your: °· Height and weight. This may be used to calculate body mass index (BMI), which tells if you are at a healthy weight. °· Heart rate and blood pressure. °· Skin for abnormal spots. °Counseling °Your health care provider may ask you questions about your: °· Alcohol, tobacco, and drug use. °· Emotional well-being. °· Home and relationship well-being. °· Sexual activity. °· Eating habits. °· Work and work environment. °· Method of birth control. °· Menstrual cycle. °· Pregnancy history. °What immunizations do I need? ° °Influenza (flu) vaccine °· This is recommended every year. °Tetanus, diphtheria, and pertussis (Tdap) vaccine °· You may need a Td booster every 10 years. °Varicella (chickenpox) vaccine °· You may need this if you have not been vaccinated. °Zoster (shingles) vaccine °· You may need this after age 60. °Measles, mumps, and rubella (MMR) vaccine °· You may need at least one dose of MMR if you were born in 1957 or later. You may also need a second dose. °Pneumococcal conjugate (PCV13) vaccine °· You may need this if you have certain conditions and were not previously vaccinated. °Pneumococcal polysaccharide (PPSV23) vaccine °· You may need one or two doses if you smoke cigarettes or if you have certain conditions. °Meningococcal conjugate (MenACWY) vaccine °· You may need this if you  have certain conditions. °Hepatitis A vaccine °· You may need this if you have certain conditions or if you travel or work in places where you may be exposed to hepatitis A. °Hepatitis B vaccine °· You may need this if you have certain conditions or if you travel or work in places where you may be exposed to hepatitis B. °Haemophilus influenzae type b (Hib) vaccine °· You may need this if you have certain conditions. °Human papillomavirus (HPV) vaccine °· If recommended by your health care provider, you may need three doses over 6 months. °You may receive vaccines as individual doses or as more than one vaccine together in one shot (combination vaccines). Talk with your health care provider about the risks and benefits of combination vaccines. °What tests do I need? °Blood tests °· Lipid and cholesterol levels. These may be checked every 5 years, or more frequently if you are over 50 years old. °· Hepatitis C test. °· Hepatitis B test. °Screening °· Lung cancer screening. You may have this screening every year starting at age 55 if you have a 30-pack-year history of smoking and currently smoke or have quit within the past 15 years. °· Colorectal cancer screening. All adults should have this screening starting at age 50 and continuing until age 75. Your health care provider may recommend screening at age 45 if you are at increased risk. You will have tests every 1-10 years, depending on your results and the type of screening test. °· Diabetes screening. This is done by checking your blood sugar (glucose) after you have not eaten for a while (fasting). You may have this   done every 1-3 years.  Mammogram. This may be done every 1-2 years. Talk with your health care provider about when you should start having regular mammograms. This may depend on whether you have a family history of breast cancer.  BRCA-related cancer screening. This may be done if you have a family history of breast, ovarian, tubal, or peritoneal  cancers.  Pelvic exam and Pap test. This may be done every 3 years starting at age 21. Starting at age 30, this may be done every 5 years if you have a Pap test in combination with an HPV test. Other tests  Sexually transmitted disease (STD) testing.  Bone density scan. This is done to screen for osteoporosis. You may have this scan if you are at high risk for osteoporosis. Follow these instructions at home: Eating and drinking  Eat a diet that includes fresh fruits and vegetables, whole grains, lean protein, and low-fat dairy.  Take vitamin and mineral supplements as recommended by your health care provider.  Do not drink alcohol if: ? Your health care provider tells you not to drink. ? You are pregnant, may be pregnant, or are planning to become pregnant.  If you drink alcohol: ? Limit how much you have to 0-1 drink a day. ? Be aware of how much alcohol is in your drink. In the [place] one drink equals one 12 oz bottle of beer (355 mL), one 5 oz glass of wine (148 mL), or one 1 oz glass of hard liquor (44 mL). Lifestyle  Take daily care of your teeth and gums.  Stay active. Exercise for at least 30 minutes on 5 or more days each week.  Do not use any products that contain nicotine or tobacco, such as cigarettes, e-cigarettes, and chewing tobacco. If you need help quitting, ask your health care provider.  If you are sexually active, practice safe sex. Use a condom or other form of birth control (contraception) in order to prevent pregnancy and STIs (sexually transmitted infections).  If told by your health care provider, take low-dose aspirin daily starting at age 50. What's next?  Visit your health care provider once a year for a well check visit.  Ask your health care provider how often you should have your eyes and teeth checked.  Stay up to date on all vaccines. This information is not intended to replace advice given to you by your health care provider. Make sure you  discuss any questions you have with your health care provider. Document Revised: 01/01/2018 Document Reviewed: 01/01/2018 Elsevier Patient Education  2020 Elsevier Inc.   Preventing Osteoporosis, Adult Osteoporosis is a condition that causes the bones to lose density. This means that the bones become thinner, and the normal spaces in bone tissue become larger. Low bone density can make the bones weak and cause them to break more easily. Osteoporosis cannot always be prevented, but you can take steps to lower your risk of developing this condition. How can this condition affect me? If you develop osteoporosis, you will be more likely to break bones in your wrist, spine, or hip. Even a minor accident or injury can be enough to break weak bones. The bones will also be slower to heal. Osteoporosis can cause other problems as well, such as a stooped posture or trouble with movement. Osteoporosis can occur with aging. As you get older, you may lose bone tissue more quickly, or it may be replaced more slowly. Osteoporosis is more likely to develop if you have   poor nutrition or do not get enough calcium or vitamin D. Other lifestyle factors can also play a role. By eating a well-balanced diet and making lifestyle changes, you can help keep your bones strong and healthy, lowering your chances of developing osteoporosis. What can increase my risk? The following factors may make you more likely to develop osteoporosis:  Having a family history of the condition.  Having poor nutrition or not getting enough calcium or vitamin D.  Using certain medicines, such as steroid medicines or antiseizure medicines.  Being any of the following: ? 50 years of age or older. ? Female. ? A woman who has gone through menopause (is postmenopausal). ? White (Caucasian) or of Asian descent.  Smoking or having a history of smoking.  Not being physically active (being sedentary).  Having a small body frame. What  actions can I take to prevent this?  Get enough calcium   Make sure you get enough calcium every day. Calcium is the most important mineral for bone health. Most people can get enough calcium from their diet, but supplements may be recommended for people who are at risk for osteoporosis. Follow these guidelines: ? If you are age 50 or younger, aim to get 1,000 mg of calcium every day. ? If you are older than age 50, aim to get 1,200 mg of calcium every day.  Good sources of calcium include: ? Dairy products, such as low-fat or nonfat milk, cheese, and yogurt. ? Dark green leafy vegetables, such as bok choy and broccoli. ? Foods that have had calcium added to them (calcium-fortified foods), such as orange juice, cereal, bread, soy beverages, and tofu products. ? Nuts, such as almonds.  Check nutrition labels to see how much calcium is in a food or drink. Get enough vitamin D  Try to get enough vitamin D every day. Vitamin D is the most essential vitamin for bone health. It helps the body absorb calcium. Follow these guidelines for how much vitamin D to get from food: ? If you are age 70 or younger, aim to get at least 600 international units (IU) every day. Your health care provider may suggest more. ? If you are older than age 70, aim to get at least 800 international units every day. Your health care provider may suggest more.  Good sources of vitamin D in your diet include: ? Egg yolks. ? Oily fish, such as salmon, sardines, and tuna. ? Milk and cereal fortified with vitamin D.  Your body also makes vitamin D when you are out in the sun. Exposing the bare skin on your face, arms, legs, or back to the sun for no more than 30 minutes a day, 2 times a week is more than enough. Beyond that, make sure you use sunblock to protect your skin from sunburn, which increases your risk for skin cancer. Exercise  Stay active and get exercise every day.  Ask your health care provider what types of  exercise are best for you. Weight-bearing and strength-building activities are important for building and maintaining healthy bones. Some examples of these types of activities include: ? Walking and hiking. ? Jogging and running. ? Dancing. ? Gym exercises. ? Lifting weights. ? Tennis and racquetball. ? Climbing stairs. ? Aerobics. Make other lifestyle changes  Do not use any products that contain nicotine or tobacco, such as cigarettes, e-cigarettes, and chewing tobacco. If you need help quitting, ask your health care provider.  Lose weight if you are overweight.    If you drink alcohol: ? Limit how much you use to:  0-1 drink a day for nonpregnant women.  0-2 drinks a day for men. ? Be aware of how much alcohol is in your drink. In the [place] one drink equals one 12 oz bottle of beer (355 mL), one 5 oz glass of wine (148 mL), or one 1 oz glass of hard liquor (44 mL). Where to find support If you need help making changes to prevent osteoporosis, talk with your health care provider. You can ask for a referral to a diet and nutrition specialist (dietitian) and a physical therapist. Where to find more information Learn more about osteoporosis from:  NIH Osteoporosis and Related Bone Diseases National Resource Center: www.bones.nih.gov  [place] Office on Women's Health: www.womenshealth.gov  National Osteoporosis Foundation: www.nof.org Summary  Osteoporosis is a condition that causes weak bones that are more likely to break.  Eat a healthy diet, making sure you get enough calcium and vitamin D, and stay active by getting regular exercise to help prevent osteoporosis.  Other ways to reduce your risk of osteoporosis include maintaining a healthy weight and avoiding alcohol and products that contain nicotine or tobacco. This information is not intended to replace advice given to you by your health care provider. Make sure you discuss any questions you have with your health care  provider. Document Revised: 11/20/2018 Document Reviewed: 11/20/2018 Elsevier Patient Education  2020 Elsevier Inc.  

## 2019-12-24 ENCOUNTER — Telehealth: Payer: Self-pay

## 2019-12-24 LAB — COMPLETE METABOLIC PANEL WITH GFR
AG Ratio: 1.5 (calc) (ref 1.0–2.5)
ALT: 11 U/L (ref 6–29)
AST: 13 U/L (ref 10–35)
Albumin: 4.2 g/dL (ref 3.6–5.1)
Alkaline phosphatase (APISO): 53 U/L (ref 31–125)
BUN: 12 mg/dL (ref 7–25)
CO2: 30 mmol/L (ref 20–32)
Calcium: 9.4 mg/dL (ref 8.6–10.2)
Chloride: 102 mmol/L (ref 98–110)
Creat: 0.88 mg/dL (ref 0.50–1.10)
GFR, Est African American: 91 mL/min/{1.73_m2} (ref >=60)
GFR, Est Non African American: 78 mL/min/{1.73_m2} (ref >=60)
Globulin: 2.8 g/dL (calc) (ref 1.9–3.7)
Glucose, Bld: 85 mg/dL (ref 65–139)
Potassium: 4 mmol/L (ref 3.5–5.3)
Sodium: 139 mmol/L (ref 135–146)
Total Bilirubin: 0.5 mg/dL (ref 0.2–1.2)
Total Protein: 7 g/dL (ref 6.1–8.1)

## 2019-12-24 LAB — LIPID PANEL
Cholesterol: 249 mg/dL — ABNORMAL HIGH (ref <200)
HDL: 58 mg/dL (ref >=50)
LDL Cholesterol (Calc): 169 mg/dL (calc) — ABNORMAL HIGH
Non-HDL Cholesterol (Calc): 191 mg/dL (calc) — ABNORMAL HIGH (ref <130)
Total CHOL/HDL Ratio: 4.3 (calc) (ref <5.0)
Triglycerides: 101 mg/dL (ref <150)

## 2019-12-24 LAB — HEPATITIS C ANTIBODY
Hepatitis C Ab: NONREACTIVE
SIGNAL TO CUT-OFF: 0.01 (ref <1.00)

## 2019-12-24 LAB — TSH: TSH: 0.67 mIU/L

## 2019-12-24 LAB — T4, FREE: Free T4: 1.1 ng/dL (ref 0.8–1.8)

## 2019-12-24 NOTE — Telephone Encounter (Signed)
Copied from CRM [id number]. Topic: General - Other >> Dec 24, 2019  1:31 PM Mcneil, Ja-Kwan wrote: Reason for CRM: Pt stated she is being treated for anxiety but would like to have an x-ray of  her lungs to rule out any other things. Pt request that a message be sent to pcp and/or her nurse to order an x-ray of the lungs.Cb# [phone]

## 2019-12-28 NOTE — Telephone Encounter (Signed)
Patient informed. 

## 2019-12-28 NOTE — Telephone Encounter (Signed)
Left patient a message.

## 2019-12-31 ENCOUNTER — Other Ambulatory Visit: Payer: Self-pay

## 2019-12-31 ENCOUNTER — Encounter: Payer: Self-pay | Admitting: Family Medicine

## 2019-12-31 ENCOUNTER — Telehealth (INDEPENDENT_AMBULATORY_CARE_PROVIDER_SITE_OTHER): Payer: BC Managed Care – PPO | Admitting: Family Medicine

## 2019-12-31 DIAGNOSIS — R0989 Other specified symptoms and signs involving the circulatory and respiratory systems: Secondary | ICD-10-CM | POA: Insufficient documentation

## 2019-12-31 DIAGNOSIS — R059 Cough, unspecified: Secondary | ICD-10-CM | POA: Insufficient documentation

## 2019-12-31 DIAGNOSIS — J069 Acute upper respiratory infection, unspecified: Secondary | ICD-10-CM | POA: Diagnosis not present

## 2019-12-31 DIAGNOSIS — Z20822 Contact with and (suspected) exposure to covid-19: Secondary | ICD-10-CM | POA: Diagnosis not present

## 2019-12-31 NOTE — Progress Notes (Signed)
Name: Andrea Pope   MRN: [id number]    DOB: [birth year]   Date:12/31/2019       Progress Note  Subjective:    Chief Complaint  Chief Complaint  Patient presents with  . Cough    5 days   . Nasal Congestion    I connected with  Andrea Pope  on 12/31/19 at  9:40 AM EDT by a video enabled telemedicine application and verified that I am speaking with the correct person using two identifiers.  I discussed the limitations of evaluation and management by telemedicine and the availability of in person appointments. The patient expressed understanding and agreed to proceed. Staff also discussed with the patient that there may be a patient responsible charge related to this service. Patient Location: home Provider Location: [facility] Additional Individuals present: none  I have connected twice on video with patient and once calling her cell phone from the office phone - all times I could see the pt and only hear my own voice and pt motioned that she could not hear me - and I did not hear her. Having CMA call again from other office phone to see if we can get a connection 9:50-10:04 AM  HPI Still having audio problems with her phone and reconnecting to wireless earphones 10:10 AM Sunday, 5 d ago she started having some congestion - mostly when she wakes up in the morning, nasal congestion and drainage Cough is mild, non-productive and she has some increased congestion and heaviness in chest. No body aches, ST, HA, fever, sweats, chills. She got initial COVID vaccine 12/13/2019, started to have mild sx 12/26/2019, she thinks from changes at work where she is going into cold air conditioned rooms, sx are worse in the morning She has tried combo cold/mucinex med and it makes her feel weird. She has been wearing a mask in any public places, at work today, no known sick contact    Patient Active Problem List   Diagnosis Date Noted  . Cough 12/31/2019  . Chest congestion  12/31/2019  . Hypertension, benign 12/03/2019  . Fibrocystic breast changes, bilateral 12/03/2019  . 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 Obstructive sleep apnea 12/30/2018  . Absolute anemia 01/09/2018  . Vitamin B12 deficiency 01/08/2018  . Vitamin D deficiency 01/08/2018  . Positive ANA (antinuclear antibody) 12/15/2017  . Primary osteoarthritis of right knee 05/13/2017  . Overweight (BMI 25.0-29.9) 11/19/2016  . White coat hypertension 01/25/2016  . Dysmenorrhea 01/03/2016  . High cholesterol 01/01/2016  . History of ectopic pregnancy 11/29/2015  . Abnormal thyroid stimulating hormone (TSH) level 01/10/2015  . Allergic rhinitis, seasonal 01/10/2015  . Asymptomatic varicose veins of both lower extremities 01/10/2015    Social History   Tobacco Use  . Smoking status: Never Smoker  . Smokeless tobacco: Never Used  Substance Use Topics  . Alcohol use: No    Alcohol/week: 0.0 standard drinks     Current Outpatient Medications:  .  busPIRone (BUSPAR) 5 MG tablet, Take 1-2 tablets (5-10 mg total) by mouth 2 (two) times daily as needed (for anxiety/stress symptoms)., Disp: 120 tablet, Rfl: 1 .  hydrochlorothiazide (HYDRODIURIL) 12.5 MG tablet, Take 1 tablet (12.5 mg total) by mouth daily., Disp: 30 tablet, Rfl: 6 .  losartan (COZAAR) 100 MG tablet, Take 1 tablet (100 mg total) by mouth daily., Disp: 30 tablet, Rfl: 6 .  VITAMIN D PO, Take by mouth once a week. , Disp: , Rfl:  .  metoprolol tartrate (LOPRESSOR) 50 MG tablet, Take 1 tablet (50 mg total) by mouth once for 1 dose. Take 2 hours prior to your CT scan., Disp: 1 tablet, Rfl: 0 .  pantoprazole (PROTONIX) 40 MG tablet, Take 40 mg by mouth 2 (two) times daily. (Patient not taking: Reported on 12/31/2019), Disp: , Rfl:   Allergies  Allergen Reactions  . Contrave [Naltrexone-Bupropion Hcl Er]     Palpitations, hypertension    I personally reviewed active problem list, medication list,  allergies, family history, social history, health maintenance, notes from last encounter, lab results, imaging with the patient/caregiver today.   Review of Systems  10 Systems reviewed and are negative for acute change except as noted in the HPI.   Objective:   Virtual encounter, vitals limited, only able to obtain the following There were no vitals filed for this visit. There is no height or weight on file to calculate BMI. Nursing Note and Vital Signs reviewed.  Physical Exam Pt well appearing, wearing a mask, no cough or respiratory distress PE limited by telephone encounter  No results found for this or any previous visit (from the past 72 hour(s)).  Assessment and Plan:     ICD-10-CM   1. Suspected COVID-19 virus infection  Z20.822   2. Upper respiratory tract infection, unspecified type  J06.9     Pt advised to quarantine and test for possible COVID - supportive and symptomatic tx for now Onset of sx 12/26/2019 - would need to test and quarantine until possibly 01/05/2020 Given info to refer to CDC COVID web sites and sx Insurance risk surveyor will call to give her testing site resources Advised to get PCR/molecular test  Work note to excuse pt sent through [employer] flags and when to present for emergency care or RTC including chest pain, shortness of breath, new/worsening/un-resolving symptoms, reviewed with patient at time of visit. Follow up and care instructions discussed and provided in AVS. - I discussed the assessment and treatment plan with the patient. The patient was provided an opportunity to ask questions and all were answered. The patient agreed with the plan and demonstrated an understanding of the instructions.  I provided 20+ minutes of non-face-to-face time during this encounter.  Danelle Berry, PA-C 12/31/19 10:26 AM

## 2019-12-31 NOTE — Patient Instructions (Addendum)
[email or web address]  Please read through the [facility] website of possible sx, and please get tested and quarantine  You can use over the counter medicines for your symptoms - for you I recommend using possibly lower dose mucinex and you can try Coricidin for symptoms.   If you get more ill feeling - you will need to postpone your 2nd COVID vaccine until 2 weeks after you are well.     Cough, Adult A cough helps to clear your throat and lungs. A cough may be a sign of an illness or another medical condition. An acute cough may only last 2-3 weeks, while a chronic cough may last 8 or more weeks. Many things can cause a cough. They include:  Germs (viruses or bacteria) that attack the airway.  Breathing in things that bother (irritate) your lungs.  Allergies.  Asthma.  Mucus that runs down the back of your throat (postnasal drip).  Smoking.  Acid backing up from the stomach into the tube that moves food from the mouth to the stomach (gastroesophageal reflux).  Some medicines.  Lung problems.  Other medical conditions, such as heart failure or a blood clot in the lung (pulmonary embolism). Follow these instructions at home: Medicines  Take over-the-counter and prescription medicines only as told by your doctor.  Talk with your doctor before you take medicines that stop a cough (coughsuppressants). Lifestyle   Do not smoke, and try not to be around smoke. Do not use any products that contain nicotine or tobacco, such as cigarettes, e-cigarettes, and chewing tobacco. If you need help quitting, ask your doctor.  Drink enough fluid to keep your pee (urine) pale yellow.  Avoid caffeine.  Do not drink alcohol if your doctor tells you not to drink. General instructions   Watch for any changes in your cough. Tell your doctor about them.  Always cover your mouth when you cough.  Stay away from things that make you cough,  such as perfume, candles, campfire smoke, or cleaning products.  If the air is dry, use a cool mist vaporizer or humidifier in your home.  If your cough is worse at night, try using extra pillows to raise your head up higher while you sleep.  Rest as needed.  Keep all follow-up visits as told by your doctor. This is important. Contact a doctor if:  You have new symptoms.  You cough up pus.  Your cough does not get better after 2-3 weeks, or your cough gets worse.  Cough medicine does not help your cough and you are not sleeping well.  You have pain that gets worse or pain that is not helped with medicine.  You have a fever.  You are losing weight and you do not know why.  You have night sweats. Get help right away if:  You cough up blood.  You have trouble breathing.  Your heartbeat is very fast. These symptoms may be an emergency. Do not wait to see if the symptoms will go away. Get medical help right away. Call your local emergency services (911 in the [place] Do not drive yourself to the hospital. Summary  A cough helps to clear your throat and lungs. Many things can cause a cough.  Take over-the-counter and prescription medicines only as told by your doctor.  Always cover your mouth when you cough.  Contact a doctor if you have new symptoms or you have a cough that does not get better or gets worse. This information is not intended  to replace advice given to you by your health care provider. Make sure you discuss any questions you have with your health care provider. Document Revised: 05/11/2018 Document Reviewed: 05/11/2018 Elsevier Patient Education  2020 [employer].

## 2020-01-03 ENCOUNTER — Other Ambulatory Visit: Payer: BC Managed Care – PPO

## 2020-01-03 ENCOUNTER — Other Ambulatory Visit: Payer: Self-pay | Admitting: Neurology

## 2020-01-03 ENCOUNTER — Telehealth: Payer: BC Managed Care – PPO

## 2020-01-03 ENCOUNTER — Encounter: Payer: Self-pay | Admitting: Cardiology

## 2020-01-03 ENCOUNTER — Ambulatory Visit (INDEPENDENT_AMBULATORY_CARE_PROVIDER_SITE_OTHER): Payer: BC Managed Care – PPO

## 2020-01-03 ENCOUNTER — Other Ambulatory Visit: Payer: Self-pay

## 2020-01-03 ENCOUNTER — Ambulatory Visit: Payer: BC Managed Care – PPO | Attending: Internal Medicine

## 2020-01-03 DIAGNOSIS — Z131 Encounter for screening for diabetes mellitus: Secondary | ICD-10-CM | POA: Diagnosis not present

## 2020-01-03 DIAGNOSIS — R768 Other specified abnormal immunological findings in serum: Secondary | ICD-10-CM | POA: Diagnosis not present

## 2020-01-03 DIAGNOSIS — R0600 Dyspnea, unspecified: Secondary | ICD-10-CM

## 2020-01-03 DIAGNOSIS — E538 Deficiency of other specified B group vitamins: Secondary | ICD-10-CM | POA: Diagnosis not present

## 2020-01-03 DIAGNOSIS — R202 Paresthesia of skin: Secondary | ICD-10-CM | POA: Diagnosis not present

## 2020-01-03 DIAGNOSIS — Z23 Encounter for immunization: Secondary | ICD-10-CM

## 2020-01-03 DIAGNOSIS — R079 Chest pain, unspecified: Secondary | ICD-10-CM | POA: Diagnosis not present

## 2020-01-03 DIAGNOSIS — R072 Precordial pain: Secondary | ICD-10-CM | POA: Diagnosis not present

## 2020-01-03 DIAGNOSIS — R0609 Other forms of dyspnea: Secondary | ICD-10-CM

## 2020-01-03 DIAGNOSIS — E519 Thiamine deficiency, unspecified: Secondary | ICD-10-CM | POA: Diagnosis not present

## 2020-01-03 DIAGNOSIS — R200 Anesthesia of skin: Secondary | ICD-10-CM | POA: Diagnosis not present

## 2020-01-03 DIAGNOSIS — E531 Pyridoxine deficiency: Secondary | ICD-10-CM | POA: Diagnosis not present

## 2020-01-03 NOTE — Progress Notes (Unsigned)
Patient ID: Andrea Pope, female   DOB: 1971/03/13, 49 y.o.   MRN: [id number] Patient here for an Echo.  States she had second dose of Covid vaccine Proofreader) this morning.  States she had Covid test at [facility] on Friday 12/31/19 and results were negative.

## 2020-01-03 NOTE — Progress Notes (Signed)
   Covid-19 Vaccination Clinic  Name:  Andrea Pope    MRN: [id number] DOB: 1970-08-23  01/03/2020  Ms. Penagos was observed post Covid-19 immunization for 15 minutes without incident. She was provided with Vaccine Information Sheet and instruction to access the V-Safe system.   Ms. Lukehart was instructed to call 911 with any severe reactions post vaccine: Marland Kitchen Difficulty breathing  . Swelling of face and throat  . A fast heartbeat  . A bad rash all over body  . Dizziness and weakness   Immunizations Administered    Name Date Dose VIS Date Route   Pfizer COVID-19 Vaccine 01/03/2020  3:31 PM 0.3 mL 06/30/2018 Intramuscular   Manufacturer: [employer], [employer]   Lot: [id number]   NDC: [address]-1

## 2020-01-05 LAB — ECHOCARDIOGRAM COMPLETE
Area-P 1/2: 1.89 cm2
S' Lateral: 2.8 cm

## 2020-01-06 ENCOUNTER — Ambulatory Visit
Admission: RE | Admit: 2020-01-06 | Discharge: 2020-01-06 | Disposition: A | Discharge status: Home / Self Care | Payer: BC Managed Care – PPO | Source: Ambulatory Visit | Attending: Cardiology | Admitting: Cardiology

## 2020-01-06 ENCOUNTER — Telehealth: Payer: Self-pay

## 2020-01-06 ENCOUNTER — Other Ambulatory Visit: Payer: Self-pay

## 2020-01-06 DIAGNOSIS — R072 Precordial pain: Secondary | ICD-10-CM | POA: Diagnosis not present

## 2020-01-06 MED ORDER — IOHEXOL 350 MG/ML SOLN
85.0000 mL | Freq: Once | INTRAVENOUS | Status: AC | PRN
  Administered 2020-01-06: 85 mL via INTRAVENOUS

## 2020-01-06 MED ORDER — NITROGLYCERIN 0.4 MG SL SUBL
0.8000 mg | SUBLINGUAL_TABLET | Freq: Once | SUBLINGUAL | Status: AC
  Administered 2020-01-06: 0.8 mg via SUBLINGUAL

## 2020-01-06 NOTE — Telephone Encounter (Signed)
Call to patient to review echo results.    Pt verbalized understanding and has no further questions at this time.    Advised pt to call for any further questions or concerns.  No further orders.  Confirmed upcoming appt later this month.

## 2020-01-06 NOTE — Telephone Encounter (Signed)
-----   Message from Debbe Odea, MD sent at 01/05/2020  4:14 PM EDT ----- Normal systolic function, normal diastolic function, overall normal echocardiogram.

## 2020-01-06 NOTE — Progress Notes (Signed)
Patient tolerated CT well. Gave a bottle of water to patient to drink. Ambulated to exit steady gait.

## 2020-01-11 DIAGNOSIS — Z01818 Encounter for other preprocedural examination: Secondary | ICD-10-CM | POA: Diagnosis not present

## 2020-01-13 DIAGNOSIS — K3189 Other diseases of stomach and duodenum: Secondary | ICD-10-CM | POA: Diagnosis not present

## 2020-01-13 DIAGNOSIS — R1013 Epigastric pain: Secondary | ICD-10-CM | POA: Diagnosis not present

## 2020-01-13 DIAGNOSIS — R1310 Dysphagia, unspecified: Secondary | ICD-10-CM | POA: Diagnosis not present

## 2020-01-13 DIAGNOSIS — K2950 Unspecified chronic gastritis without bleeding: Secondary | ICD-10-CM | POA: Diagnosis not present

## 2020-01-13 DIAGNOSIS — K2930 Chronic superficial gastritis without bleeding: Secondary | ICD-10-CM | POA: Diagnosis not present

## 2020-01-14 DIAGNOSIS — G4733 Obstructive sleep apnea (adult) (pediatric): Secondary | ICD-10-CM | POA: Diagnosis not present

## 2020-01-19 ENCOUNTER — Encounter: Payer: Self-pay | Admitting: Family Medicine

## 2020-01-24 ENCOUNTER — Ambulatory Visit: Admission: RE | Admit: 2020-01-24 | Payer: BC Managed Care – PPO | Source: Ambulatory Visit

## 2020-01-28 ENCOUNTER — Ambulatory Visit: Payer: BC Managed Care – PPO | Admitting: Cardiology

## 2020-01-28 ENCOUNTER — Other Ambulatory Visit: Payer: Self-pay

## 2020-01-28 ENCOUNTER — Encounter: Payer: Self-pay | Admitting: Cardiology

## 2020-01-28 VITALS — BP 122/80 | HR 55 | Ht 68.0 in | Wt 187.0 lb

## 2020-01-28 DIAGNOSIS — I10 Essential (primary) hypertension: Secondary | ICD-10-CM

## 2020-01-28 DIAGNOSIS — R079 Chest pain, unspecified: Secondary | ICD-10-CM

## 2020-01-28 NOTE — Patient Instructions (Signed)
Medication Instructions:  Your physician recommends that you continue on your current medications as directed. Please refer to the Current Medication list given to you today.  *If you need a refill on your cardiac medications before your next appointment, please call your pharmacy*   Lab Work: None Ordered If you have labs (blood work) drawn today and your tests are completely normal, you will receive your results only by: Marland Kitchen MyChart Message (if you have MyChart) OR . A paper copy in the mail If you have any lab test that is abnormal or we need to change your treatment, we will call you to review the results.   Testing/Procedures: None Order   Follow-Up: At [facility], you and your health needs are our priority.  As part of our continuing mission to provide you with exceptional heart care, we have created designated Provider Care Teams.  These Care Teams include your primary Cardiologist (physician) and Advanced Practice Providers (APPs -  Physician Assistants and Nurse Practitioners) who all work together to provide you with the care you need, when you need it.  We recommend signing up for the patient portal called "MyChart".  Sign up information is provided on this After Visit Summary.  MyChart is used to connect with patients for Virtual Visits (Telemedicine).  Patients are able to view lab/test results, encounter notes, upcoming appointments, etc.  Non-urgent messages can be sent to your provider as well.   To learn more about what you can do with MyChart, go to [email or web address].    Your next appointment:   Follow up as needed   The format for your next appointment:   In Person  Provider:   Debbe Odea, MD   Other Instructions

## 2020-01-28 NOTE — Progress Notes (Signed)
Cardiology Office Note:    Date:  01/28/2020   ID:  [name], DOB April 25, 1971, MRN [id number]  PCP:  Danelle Berry, PA-C  Cardiologist:  Debbe Odea, MD  Electrophysiologist:  None   Referring MD: Danelle Berry, PA-C   Chief Complaint  Patient presents with  . Follow-up    Echo & CT scan. Meds reviewed by the pt. verbally. "doing well."     History of Present Illness:    Andrea Pope is a 49 y.o. female with history of hypertension, anxiety who presents for follow-up.    Patient previously seen due to chest pain.  Sometimes associated with exertion.  Echocardiogram and coronary CTA was ordered to evaluate symptoms.  She feels well has no concerns at this time.  Tolerating losartan 100 mg daily and HCTZ 12.5 mg daily.  Cost for coronary CTA was too high for patient despite having insurance.   Past Medical History:  Diagnosis Date  . Abnormal thyroid stimulating hormone level   . Allergy   . Anemia    iron deficiency  . Encounter for initial prescription of injectable contraceptive   . Female stress incontinence   . Gallstones 12/05/2015  . High cholesterol   . History of ectopic pregnancy 11/29/2015  . Hypertension   . Menorrhagia with irregular cycle   . Neutropenia (HCC) 07/23/2016  . Ovarian cyst 11/29/2015  . Ovarian cyst 11/29/2015  . 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 Proteinuria 08/14/2015  . Spider veins of both lower extremities   . 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 CHOLECYSTECTOMY N/A 01/19/2016   Procedure: LAPAROSCOPIC CHOLECYSTECTOMY WITH INTRAOPERATIVE CHOLANGIOGRAM;  Surgeon: Kieth Brightly, MD;  Location: [facility] ORS;  Service: General;  Laterality: N/A;  . DILITATION &  CURRETTAGE/HYSTROSCOPY WITH NOVASURE ABLATION N/A 03/24/2017   Procedure: DILATATION & CURETTAGE/HYSTEROSCOPY WITH NOVASURE ABLATION;  Surgeon: Herold Harms, MD;  Location: [facility] ORS;  Service: Gynecology;  Laterality: N/A;  . laser surgery on eye Right 10/2016    Current Medications: Current Meds  Medication Sig  . busPIRone (BUSPAR) 5 MG tablet Take 1-2 tablets (5-10 mg total) by mouth 2 (two) times daily as needed (for anxiety/stress symptoms).  . hydrochlorothiazide (HYDRODIURIL) 12.5 MG tablet Take 1 tablet (12.5 mg total) by mouth daily.  Marland Kitchen losartan (COZAAR) 100 MG tablet Take 1 tablet (100 mg total) by mouth daily.     Allergies:   Contrave [naltrexone-bupropion hcl er]   Social History   Socioeconomic History  . Marital status: Married    Spouse name: Ron   . Number of children: 4  . Years of education: Not on file  . Highest education level: Bachelor's degree (e.g., BA, AB, BS)  Occupational History  . Occupation: Runner, broadcasting/film/video  Tobacco Use  . Smoking status: Never Smoker  . Smokeless tobacco: Never Used  Vaping Use  . Vaping Use: Never used  Substance and Sexual Activity  . Alcohol use: No    Alcohol/week: 0.0 standard drinks  . Drug use: No  . Sexual activity: Yes    Partners: Male    Birth control/protection: None  Other Topics Concern  . Not on file  Social History Narrative  . Not on file   Social Determinants of Health   Financial Resource Strain:   . Difficulty of Paying Living Expenses:  Not on file  Food Insecurity:   . Worried About Programme researcher, broadcasting/film/video in the Last Year: Not on file  . Ran Out of Food in the Last Year: Not on file  Transportation Needs:   . Lack of Transportation (Medical): Not on file  . Lack of Transportation (Non-Medical): Not on file  Physical Activity:   . Days of Exercise per Week: Not on file  . Minutes of Exercise per Session: Not on file  Stress:   . Feeling of Stress : Not on file  Social Connections:   .  Frequency of Communication with Friends and Family: Not on file  . Frequency of Social Gatherings with Friends and Family: Not on file  . Attends Religious Services: Not on file  . Active Member of Clubs or Organizations: Not on file  . Attends Banker Meetings: Not on file  . Marital Status: Not on file     Family History: The patient's family history includes Cancer in her father, paternal grandfather, and paternal uncle; Diabetes in her brother, maternal grandmother, sister, and another family member; Hypertension in her father and mother; Kidney disease in her mother; Schizophrenia in her sister; Thyroid disease in her mother. There is no history of Breast cancer, Ovarian cancer, or Heart disease.  ROS:   Please see the history of present illness.     All other systems reviewed and are negative.  EKGs/Labs/Other Studies Reviewed:    The following studies were reviewed today:   EKG:  EKG is  ordered today.  The ekg ordered today demonstrates sinus bradycardia, otherwise normal ECG.  Recent Labs: 11/10/2019: Hemoglobin 14.3; Platelets 347 12/23/2019: ALT 11; BUN 12; Creat 0.88; Potassium 4.0; Sodium 139; TSH 0.67  Recent Lipid Panel    Component Value Date/Time   CHOL 249 (H) 12/23/2019 1019   TRIG 101 12/23/2019 1019   HDL 58 12/23/2019 1019   CHOLHDL 4.3 12/23/2019 1019   VLDL 21 11/19/2016 1513   LDLCALC 169 (H) 12/23/2019 1019    Physical Exam:    VS:  BP 122/80 (BP Location: Left Arm, Patient Position: Sitting, Cuff Size: Normal)   Pulse (!) 55   Ht 5\' 8"  (1.727 m)   Wt 187 lb (84.8 kg)   SpO2 99%   BMI 28.43 kg/m     Wt Readings from Last 3 Encounters:  01/28/20 187 lb (84.8 kg)  12/23/19 188 lb 4.8 oz (85.4 kg)  12/08/19 189 lb 11.2 oz (86 kg)     GEN:  Well nourished, well developed in no acute distress HEENT: Normal NECK: No JVD; No carotid bruits LYMPHATICS: No lymphadenopathy CARDIAC: RRR, no murmurs, rubs, gallops RESPIRATORY:  Clear to  auscultation without rales, wheezing or rhonchi  ABDOMEN: Soft, non-tender, non-distended MUSCULOSKELETAL:  No edema; No deformity  SKIN: Warm and dry NEUROLOGIC:  Alert and oriented x 3 PSYCHIATRIC:  Normal affect   ASSESSMENT:    1. Chest pain of uncertain etiology   2. Essential hypertension    PLAN:    In order of problems listed above:  1. Patient with history of atypical chest pain.  Has risk factors of hypertension.  Echocardiogram showed normal systolic and diastolic function, EF 60 to 65%.  Coronary CT showed a calcium score of 0, no evidence of CAD.  Patient currently asymptomatic, she was made aware of test findings and reassured. 2. History of hypertension, BP controlled.  Continue losartan and HCTZ 12.5 mg daily.  Follow-up as needed  Total  encounter time 35 minutes  Greater than 50% was spent in counseling and coordination of care with the patient   This note was generated in part or whole with voice recognition software. Voice recognition is usually quite accurate but there are transcription errors that can and very often do occur. I apologize for any typographical errors that were not detected and corrected.  Medication Adjustments/Labs and Tests Ordered: Current medicines are reviewed at length with the patient today.  Concerns regarding medicines are outlined above.  No orders of the defined types were placed in this encounter.  No orders of the defined types were placed in this encounter.   Patient Instructions  Medication Instructions:  Your physician recommends that you continue on your current medications as directed. Please refer to the Current Medication list given to you today.  *If you need a refill on your cardiac medications before your next appointment, please call your pharmacy*   Lab Work: None Ordered If you have labs (blood work) drawn today and your tests are completely normal, you will receive your results only by: Marland Kitchen MyChart Message (if  you have MyChart) OR . A paper copy in the mail If you have any lab test that is abnormal or we need to change your treatment, we will call you to review the results.   Testing/Procedures: None Order   Follow-Up: At [facility], you and your health needs are our priority.  As part of our continuing mission to provide you with exceptional heart care, we have created designated Provider Care Teams.  These Care Teams include your primary Cardiologist (physician) and Advanced Practice Providers (APPs -  Physician Assistants and Nurse Practitioners) who all work together to provide you with the care you need, when you need it.  We recommend signing up for the patient portal called "MyChart".  Sign up information is provided on this After Visit Summary.  MyChart is used to connect with patients for Virtual Visits (Telemedicine).  Patients are able to view lab/test results, encounter notes, upcoming appointments, etc.  Non-urgent messages can be sent to your provider as well.   To learn more about what you can do with MyChart, go to [email or web address].    Your next appointment:   Follow up as needed   The format for your next appointment:   In Person  Provider:   Debbe Odea, MD   Other Instructions      Signed, Debbe Odea, MD  01/28/2020 4:23 PM    [facility]

## 2020-01-31 NOTE — Addendum Note (Signed)
Addended by: Festus Aloe on: 01/31/2020 03:34 PM   Modules accepted: Orders

## 2020-02-02 ENCOUNTER — Encounter: Payer: Self-pay | Admitting: Family Medicine

## 2020-02-13 DIAGNOSIS — G4733 Obstructive sleep apnea (adult) (pediatric): Secondary | ICD-10-CM | POA: Diagnosis not present

## 2020-02-21 ENCOUNTER — Other Ambulatory Visit: Payer: BC Managed Care – PPO

## 2020-02-21 DIAGNOSIS — Z20822 Contact with and (suspected) exposure to covid-19: Secondary | ICD-10-CM | POA: Diagnosis not present

## 2020-02-22 LAB — NOVEL CORONAVIRUS, NAA: SARS-CoV-2, NAA: NOT DETECTED

## 2020-02-22 LAB — SARS-COV-2, NAA 2 DAY TAT

## 2020-02-25 ENCOUNTER — Other Ambulatory Visit: Payer: Self-pay | Admitting: *Deleted

## 2020-02-25 MED ORDER — HYDROCHLOROTHIAZIDE 25 MG PO TABS
25.0000 mg | ORAL_TABLET | Freq: Every day | ORAL | 6 refills | Status: DC
Start: 2020-02-25 — End: 2020-05-09

## 2020-03-08 ENCOUNTER — Other Ambulatory Visit: Payer: BC Managed Care – PPO

## 2020-03-14 ENCOUNTER — Ambulatory Visit
Admission: RE | Admit: 2020-03-14 | Discharge: 2020-03-14 | Disposition: A | Discharge status: Home / Self Care | Payer: BC Managed Care – PPO | Source: Ambulatory Visit | Attending: Neurology | Admitting: Neurology

## 2020-03-14 ENCOUNTER — Other Ambulatory Visit: Payer: Self-pay

## 2020-03-14 DIAGNOSIS — E0590 Thyrotoxicosis, unspecified without thyrotoxic crisis or storm: Secondary | ICD-10-CM | POA: Diagnosis not present

## 2020-03-14 DIAGNOSIS — Z23 Encounter for immunization: Secondary | ICD-10-CM | POA: Diagnosis not present

## 2020-03-14 DIAGNOSIS — R202 Paresthesia of skin: Secondary | ICD-10-CM | POA: Diagnosis not present

## 2020-03-14 DIAGNOSIS — R200 Anesthesia of skin: Secondary | ICD-10-CM | POA: Insufficient documentation

## 2020-03-14 DIAGNOSIS — M50221 Other cervical disc displacement at C4-C5 level: Secondary | ICD-10-CM | POA: Diagnosis not present

## 2020-03-14 DIAGNOSIS — R002 Palpitations: Secondary | ICD-10-CM | POA: Diagnosis not present

## 2020-03-14 DIAGNOSIS — R531 Weakness: Secondary | ICD-10-CM | POA: Diagnosis not present

## 2020-03-14 DIAGNOSIS — M47812 Spondylosis without myelopathy or radiculopathy, cervical region: Secondary | ICD-10-CM | POA: Diagnosis not present

## 2020-03-15 ENCOUNTER — Encounter: Payer: Self-pay | Admitting: Family Medicine

## 2020-03-15 DIAGNOSIS — G4733 Obstructive sleep apnea (adult) (pediatric): Secondary | ICD-10-CM | POA: Diagnosis not present

## 2020-03-16 ENCOUNTER — Ambulatory Visit: Payer: BC Managed Care – PPO | Admitting: Internal Medicine

## 2020-04-14 DIAGNOSIS — G4733 Obstructive sleep apnea (adult) (pediatric): Secondary | ICD-10-CM | POA: Diagnosis not present

## 2020-04-15 ENCOUNTER — Other Ambulatory Visit: Payer: BC Managed Care – PPO

## 2020-04-15 DIAGNOSIS — Z20822 Contact with and (suspected) exposure to covid-19: Secondary | ICD-10-CM | POA: Diagnosis not present

## 2020-04-17 LAB — NOVEL CORONAVIRUS, NAA: SARS-CoV-2, NAA: NOT DETECTED

## 2020-04-17 LAB — SARS-COV-2, NAA 2 DAY TAT

## 2020-04-17 LAB — SPECIMEN STATUS REPORT

## 2020-04-19 ENCOUNTER — Ambulatory Visit (INDEPENDENT_AMBULATORY_CARE_PROVIDER_SITE_OTHER): Payer: BC Managed Care – PPO | Admitting: Internal Medicine

## 2020-04-19 ENCOUNTER — Other Ambulatory Visit: Payer: Self-pay

## 2020-04-19 ENCOUNTER — Encounter: Payer: Self-pay | Admitting: Internal Medicine

## 2020-04-19 VITALS — BP 120/82 | HR 80 | Temp 98.2°F | Resp 16 | Ht 68.0 in | Wt 191.2 lb

## 2020-04-19 DIAGNOSIS — F419 Anxiety disorder, unspecified: Secondary | ICD-10-CM | POA: Diagnosis not present

## 2020-04-19 DIAGNOSIS — M546 Pain in thoracic spine: Secondary | ICD-10-CM

## 2020-04-19 DIAGNOSIS — R82998 Other abnormal findings in urine: Secondary | ICD-10-CM

## 2020-04-19 DIAGNOSIS — R350 Frequency of micturition: Secondary | ICD-10-CM | POA: Diagnosis not present

## 2020-04-19 LAB — POCT URINALYSIS DIPSTICK
Bilirubin, UA: NEGATIVE
Blood, UA: NEGATIVE
Glucose, UA: NEGATIVE
Ketones, UA: NEGATIVE
Nitrite, UA: NEGATIVE
Protein, UA: NEGATIVE
Spec Grav, UA: 1.015 (ref 1.010–1.025)
Urobilinogen, UA: 0.2 E.U./dL
pH, UA: 8.5 — AB (ref 5.0–8.0)

## 2020-04-19 MED ORDER — SULFAMETHOXAZOLE-TRIMETHOPRIM 800-160 MG PO TABS: 1.0000 | ORAL_TABLET | Freq: Two times a day (BID) | ORAL | 0 refills | Status: AC

## 2020-04-19 NOTE — Patient Instructions (Signed)
Please pick up the antibiotic prescribed and take twice daily to treat a  potential urinary tract infectious source.  Can use a contrast therapy approach topically including warm compresses, followed by some simple range of motion activities, followed by some cold topically  Can take the Celebrex product twice daily for the next 3 to 5 days to help with symptoms.  Also can use extra strength Tylenol as needed  Await the result from the urinalysis and culture sent to the hospital today  Follow-up plan next Monday.

## 2020-04-19 NOTE — Progress Notes (Signed)
Patient ID: Andrea Pope, female    DOB: [birth year], 49 y.o.   MRN: [id number]  PCP: Danelle Berryapia, Leisa, PA-C  Chief Complaint  Patient presents with  . Back Pain  . Urinary Frequency    Subjective:   Andrea Pope is a 49 y.o. female, presents to clinic with CC of the following:  Chief Complaint  Patient presents with  . Back Pain  . Urinary Frequency    HPI:  Patient is a 49 year old female patient of Danelle BerryLeisa Tapia Last visit with her was a video visit in August 2021 Follows up today with back pain and urine frequency. Noted not having the back pain presently  She notes that for the past couple weeks, she is intermittently having some back discomfort in the left flank area, more above that CVA area.  Described as achy, not sharp, not keeping her up at night.  Denies any heart racing or marked shortness of breath episodes, although notes she occasionally feels some shortness of breath related to her anxiety she states.  She states she has been having urinary frequency for a while, she thinks related to taking a water pill.  Denies any burning with urination, no blood in the urine.  Denies fevers, no nausea or vomiting. She has no history of kidney stones or kidney infections. She denies any trauma before the symptoms started, and no radiating pains down the arms or legs.  No numbness or tingling in the arms or legs.  She has applied a muscle rub which helps some, and also taken Tylenol and does help some as well.  She has Celebrex to take for wrist pain, as she avoids NSAIDs due to her hypertension history, although has not used the Celebrex for this back pain. She denies any abdominal pains, no pains that radiate around to the abdomen.  No tobacco history No diabetes history.  Patient Active Problem List   Diagnosis Date Noted  . Cough 12/31/2019  . Chest congestion 12/31/2019  . Hypertension, benign 12/03/2019  . Fibrocystic breast changes, bilateral 12/03/2019  . Mild  episode of recurrent major depressive disorder (HCC) 03/26/2019  . Obstructive sleep apnea 12/30/2018  . Absolute anemia 01/09/2018  . Vitamin B12 deficiency 01/08/2018  . Vitamin D deficiency 01/08/2018  . Positive ANA (antinuclear antibody) 12/15/2017  . Primary osteoarthritis of right knee 05/13/2017  . Overweight (BMI 25.0-29.9) 11/19/2016  . White coat hypertension 01/25/2016  . Dysmenorrhea 01/03/2016  . High cholesterol 01/01/2016  . History of ectopic pregnancy 11/29/2015  . Abnormal thyroid stimulating hormone (TSH) level 01/10/2015  . Allergic rhinitis, seasonal 01/10/2015  . Asymptomatic varicose veins of both lower extremities 01/10/2015      Current Outpatient Medications:  .  busPIRone (BUSPAR) 5 MG tablet, Take 1-2 tablets (5-10 mg total) by mouth 2 (two) times daily as needed (for anxiety/stress symptoms)., Disp: 120 tablet, Rfl: 1 .  celecoxib (CELEBREX) 100 MG capsule, Take by mouth., Disp: , Rfl:  .  hydrochlorothiazide (HYDRODIURIL) 25 MG tablet, Take 1 tablet (25 mg total) by mouth daily., Disp: 30 tablet, Rfl: 6 .  VITAMIN D PO, Take by mouth once a week. , Disp: , Rfl:  .  losartan (COZAAR) 100 MG tablet, Take 1 tablet (100 mg total) by mouth daily., Disp: 30 tablet, Rfl: 6 .  sulfamethoxazole-trimethoprim (BACTRIM DS) 800-160 MG tablet, Take 1 tablet by mouth 2 (two) times daily for 7 days., Disp: 14 tablet, Rfl: 0   Allergies  Allergen Reactions  .  Contrave [Naltrexone-Bupropion Hcl Er]     Palpitations, hypertension     Past Surgical History:  Procedure Laterality Date  . CHOLECYSTECTOMY N/A 01/19/2016   Procedure: LAPAROSCOPIC CHOLECYSTECTOMY WITH INTRAOPERATIVE CHOLANGIOGRAM;  Surgeon: Kieth Brightly, MD;  Location: [facility] ORS;  Service: General;  Laterality: N/A;  . DILITATION & CURRETTAGE/HYSTROSCOPY WITH NOVASURE ABLATION N/A 03/24/2017   Procedure: DILATATION & CURETTAGE/HYSTEROSCOPY WITH NOVASURE ABLATION;  Surgeon: Herold Harms,  MD;  Location: [facility] ORS;  Service: Gynecology;  Laterality: N/A;  . laser surgery on eye Right 10/2016     Family History  Problem Relation Age of Onset  . Hypertension Mother   . Thyroid disease Mother   . Kidney disease Mother   . Hypertension Father   . Cancer Father        Prostate  . Diabetes Brother   . Diabetes Other   . Cancer Paternal Uncle        Prostate  . Diabetes Maternal Grandmother   . Cancer Paternal Grandfather        Prostate  . Diabetes Sister   . Schizophrenia Sister   . Breast cancer Neg Hx   . Ovarian cancer Neg Hx   . Heart disease Neg Hx      Social History   Tobacco Use  . Smoking status: Never Smoker  . Smokeless tobacco: Never Used  Substance Use Topics  . Alcohol use: No    Alcohol/week: 0.0 standard drinks    With staff assistance, above reviewed with the patient today.  ROS: As per HPI, otherwise no specific complaints on a limited and focused system review   Results for orders placed or performed in visit on 04/19/20 (from the past 72 hour(s))  POCT Urinalysis Dipstick     Status: Abnormal   Collection Time: 04/19/20  3:15 PM  Result Value Ref Range   Color, UA yellow    Clarity, UA cloudy    Glucose, UA Negative Negative   Bilirubin, UA Negative    Ketones, UA Negative    Spec Grav, UA 1.015 1.010 - 1.025   Blood, UA Negative    pH, UA 8.5 (A) 5.0 - 8.0   Protein, UA Negative Negative   Urobilinogen, UA 0.2 0.2 or 1.0 E.U./dL   Nitrite, UA Negative    Leukocytes, UA Moderate (2+) (A) Negative   Appearance     Odor       PHQ2/9: Depression screen [facility] 2/9 12/31/2019 12/23/2019 12/23/2019 12/03/2019 10/28/2019  Decreased Interest 0 0 0 0 0  Down, Depressed, Hopeless 0 0 0 0 0  PHQ - 2 Score 0 0 0 0 0  Altered sleeping 0 0 0 0 0  Tired, decreased energy 0 1 0 0 0  Change in appetite 0 0 0 0 0  Feeling bad or failure about yourself  0 0 0 0 0  Trouble concentrating 0 0 0 0 0  Moving slowly or fidgety/restless 0 0 0 0 0   Suicidal thoughts 0 0 0 0 0  PHQ-9 Score 0 1 0 0 0  Difficult doing work/chores - Not difficult at all Not difficult at all - Not difficult at all  Some recent data might be hidden   PHQ-2 today were zeros  Fall Risk: Fall Risk  04/19/2020 12/31/2019 12/23/2019 12/03/2019 10/28/2019  Falls in the past year? 0 0 0 0 0  Number falls in past yr: 0 0 0 0 0  Injury with Fall? 0 0 0 0 0  Follow up - - - - -      Objective:   Vitals:   04/19/20 1503  BP: 120/82  Pulse: 80  Resp: 16  Temp: 98.2 F (36.8 C)  TempSrc: Oral  SpO2: 99%  Weight: 191 lb 3.2 oz (86.7 kg)  Height: 5\' 8"  (1.727 m)    Body mass index is 29.07 kg/m.  Physical Exam   NAD, masked, pleasant, resting comfortably on the exam table HEENT - Salome/AT, sclera anicteric, PERRL, conj - non-inj'ed,  Neck - supple, no adenopathy,  Car - RRR without m/g/r, not tachycardic Pulm- RR and effort normal at rest, CTA without wheeze or rales, good air movement noted in the left lung fields Abd - soft, NT diffusely, ND,  no obvious masses Back - no focal CVA tenderness, mildly tender with deep palpation slightly above the left CVA region, and she notes that is where she feels the discomfort more in a diffuse area than any focal tenderness.  Nontender over the thoracic spine, nontender in the infrascapular region with palpation, denies any discomfort past the axillary line or extending around towards the abdomen. No discomfort with left arm elevation or Apley"s test from above Straight leg raise was negative Neuro/psychiatric - affect was not flat, appropriate with conversation  Alert and oriented  Grossly non-focal  Speech normal   Results for orders placed or performed in visit on 04/19/20  POCT Urinalysis Dipstick  Result Value Ref Range   Color, UA yellow    Clarity, UA cloudy    Glucose, UA Negative Negative   Bilirubin, UA Negative    Ketones, UA Negative    Spec Grav, UA 1.015 1.010 - 1.025   Blood, UA Negative     pH, UA 8.5 (A) 5.0 - 8.0   Protein, UA Negative Negative   Urobilinogen, UA 0.2 0.2 or 1.0 E.U./dL   Nitrite, UA Negative    Leukocytes, UA Moderate (2+) (A) Negative   Appearance     Odor     Urine dip had moderate leukocytes, with the urine cloudy in appearance, otherwise negative    Assessment & Plan:   1. Urinary frequency 2. Urine leukocytes She noted the urine frequency has been present for some time now, with no dysuria symptoms.  The urine had moderate leukocytes, was cloudy, and otherwise negative.  Her discomfort is above the CVA region, although she notes it sometimes can extend in the CVA region.  Does not seem classic for a pyelonephritis, although cannot exclude a urinary tract infectious component. We will send the urine for a complete urinalysis and urine culture Felt best to add an antibiotic as we await those results, and felt a Bactrim DS product was appropriate noting the level of suspicion.  (Rather than a quinolone with more concerning risks in the risk benefit ratio) Also to stay well-hydrated. - POCT Urinalysis Dipstick - sulfamethoxazole-trimethoprim (BACTRIM DS) 800-160 MG tablet; Take 1 tablet by mouth 2 (two) times daily for 7 days.  Dispense: 14 tablet; Refill: 0 - Urine Culture - Urinalysis, Complete  3. Acute left-sided thoracic back pain Exact source of this discomfort unclear, with kidney stone very unlikely with no blood in the urine, cannot exclude a possible early pyelonephritis concern, although the discomfort seems above the CVA region.  She is not tachycardic and very comfortable with her breathing, and lung sounds are good and doubt any pneumothorax concern.  Could be musculoskeletal as we discussed, with no prior trauma recalled before her symptoms started.  Would not rush to an x-ray today, although may need on follow-up if symptoms persisting. We will try a contrast therapy approach, also can take her Celebrex product twice daily for the next 3 to 5  days and continue to use a Tylenol product as needed in the short-term.  Felt best to schedule a follow-up before the holiday break, and she will follow-up next Monday, and should follow-up sooner if symptoms significantly increasing or more problematic.. If symptoms not improving or more problematic, will discuss some potential next steps and await that reassessment. Also await the UA and urine culture results presently.  4.  Anxiety disorder He did note she does feel some anxiety intermittently, with occasionally feeling some shortness of breath when she is anxious. Do not feel comfortable attributing her symptoms to an anxiety source presently, and continue to monitor.    Jamelle Haring, MD 04/19/20 3:40 PM

## 2020-04-20 LAB — URINE CULTURE
MICRO NUMBER:: 11322605
Result:: NO GROWTH
SPECIMEN QUALITY:: ADEQUATE

## 2020-04-20 LAB — URINALYSIS, COMPLETE
Bilirubin Urine: NEGATIVE
Glucose, UA: NEGATIVE
Hgb urine dipstick: NEGATIVE
Hyaline Cast: NONE SEEN /LPF
Ketones, ur: NEGATIVE
Nitrite: NEGATIVE
Protein, ur: NEGATIVE
Specific Gravity, Urine: 1.015 (ref 1.001–1.03)
pH: 7.5 (ref 5.0–8.0)
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
## 2020-04-24 ENCOUNTER — Ambulatory Visit
Admission: RE | Admit: 2020-04-24 | Discharge: 2020-04-24 | Disposition: A | Discharge status: Home / Self Care | Payer: BC Managed Care – PPO | Source: Ambulatory Visit | Attending: Internal Medicine | Admitting: Internal Medicine

## 2020-04-24 ENCOUNTER — Encounter: Payer: Self-pay | Admitting: Internal Medicine

## 2020-04-24 ENCOUNTER — Ambulatory Visit: Payer: BC Managed Care – PPO | Admitting: Internal Medicine

## 2020-04-24 ENCOUNTER — Other Ambulatory Visit: Payer: Self-pay

## 2020-04-24 ENCOUNTER — Ambulatory Visit
Admission: RE | Admit: 2020-04-24 | Discharge: 2020-04-24 | Disposition: A | Discharge status: Home / Self Care | Payer: BC Managed Care – PPO | Attending: Internal Medicine | Admitting: Internal Medicine

## 2020-04-24 VITALS — BP 114/76 | HR 89 | Temp 98.5°F | Resp 16 | Ht 68.0 in | Wt 188.8 lb

## 2020-04-24 DIAGNOSIS — R82998 Other abnormal findings in urine: Secondary | ICD-10-CM

## 2020-04-24 DIAGNOSIS — M546 Pain in thoracic spine: Secondary | ICD-10-CM

## 2020-04-24 DIAGNOSIS — F419 Anxiety disorder, unspecified: Secondary | ICD-10-CM | POA: Diagnosis not present

## 2020-04-24 DIAGNOSIS — M545 Low back pain, unspecified: Secondary | ICD-10-CM | POA: Diagnosis not present

## 2020-04-24 LAB — POCT URINALYSIS DIPSTICK
Appearance: NORMAL
Bilirubin, UA: NEGATIVE
Blood, UA: NEGATIVE
Glucose, UA: NEGATIVE
Ketones, UA: NEGATIVE
Leukocytes, UA: NEGATIVE
Nitrite, UA: NEGATIVE
Odor: NORMAL
Protein, UA: NEGATIVE
Spec Grav, UA: 1.01 (ref 1.010–1.025)
Urobilinogen, UA: 0.2 E.U./dL
pH, UA: 7.5 (ref 5.0–8.0)

## 2020-04-24 NOTE — Patient Instructions (Signed)
Please obtain the chest x-ray that was ordered today.  I do feel the pain is likely mechanical, a musculoskeletal source for the pain.  Can continue the Tylenol products, and can alternate Motrin product at times as needed, continue the local measures.

## 2020-04-27 DIAGNOSIS — R200 Anesthesia of skin: Secondary | ICD-10-CM | POA: Diagnosis not present

## 2020-04-27 DIAGNOSIS — R202 Paresthesia of skin: Secondary | ICD-10-CM | POA: Diagnosis not present

## 2020-05-01 ENCOUNTER — Telehealth: Payer: Self-pay | Admitting: Cardiology

## 2020-05-01 DIAGNOSIS — Z03818 Encounter for observation for suspected exposure to other biological agents ruled out: Secondary | ICD-10-CM | POA: Diagnosis not present

## 2020-05-01 DIAGNOSIS — Z1152 Encounter for screening for COVID-19: Secondary | ICD-10-CM | POA: Diagnosis not present

## 2020-05-01 MED ORDER — LOSARTAN POTASSIUM 50 MG PO TABS
100.0000 mg | ORAL_TABLET | Freq: Every day | ORAL | 6 refills | Status: DC
Start: 2020-05-01 — End: 2020-07-26

## 2020-05-01 NOTE — Telephone Encounter (Signed)
Per Coleen, RN--ok to change!  Rx request sent to pharmacy.

## 2020-05-01 NOTE — Telephone Encounter (Signed)
Received fax from [facility] on [address] requesting new Rx for Losartan 50 mg, 2 tablets daily. States that Losartan 100 mg tablets are on backorder.  Ok to change and send in?

## 2020-05-02 ENCOUNTER — Encounter: Payer: Self-pay | Admitting: Internal Medicine

## 2020-05-09 ENCOUNTER — Encounter: Payer: Self-pay | Admitting: Internal Medicine

## 2020-05-09 ENCOUNTER — Ambulatory Visit (INDEPENDENT_AMBULATORY_CARE_PROVIDER_SITE_OTHER): Payer: BC Managed Care – PPO | Admitting: Internal Medicine

## 2020-05-09 ENCOUNTER — Other Ambulatory Visit: Payer: Self-pay

## 2020-05-09 VITALS — BP 120/80 | HR 69 | Temp 98.2°F | Resp 16 | Ht 68.0 in | Wt 191.1 lb

## 2020-05-09 DIAGNOSIS — J40 Bronchitis, not specified as acute or chronic: Secondary | ICD-10-CM

## 2020-05-09 DIAGNOSIS — R059 Cough, unspecified: Secondary | ICD-10-CM

## 2020-05-09 MED ORDER — AZITHROMYCIN 250 MG PO TABS: ORAL_TABLET | ORAL | 0 refills | Status: DC

## 2020-05-09 NOTE — Progress Notes (Signed)
Patient ID: Andrea Pope, female    DOB: 20-Oct-1970, 50 y.o.   MRN: [id number]  PCP: Danelle Berry, PA-C  Chief Complaint  Patient presents with  . Cough    Subjective:   Andrea Pope is a 50 y.o. female, presents to clinic with CC of the following:  Chief Complaint  Patient presents with  . Cough    HPI:  Patient is a 50 year old female patient of Danelle Berry She was seen by me on 04/24/2020 for thoracic back pain. A chest x-ray obtained at that visit was negative She follows up today with cough  She notes that beginning about 6 days ago, she developed a cough.  Notes it is occasionally productive of a yellowish/cloudy mucus.  More productive in the morning.  Cough is a little worse at nighttime.  She has coughing spells that are almost attack like that are problematic.  Denies fevers, denies any marked sinus congestion or postnasal drip, mild.  Has not lost her taste or smell, denies loose bowel movements or diarrhea.  Denies any contacts or exposures to Covid, and she did get tested for Covid on 27 December, couple days into her symptoms and notes that was negative. She has had the Covid vaccine x2, and due for her booster in January. She has tried Mucinex DM products, and notes that it does help temporarily. Denies marked shortness of breath concerns.  No chest pains.  No tobacco history No diabetes history  Patient Active Problem List   Diagnosis Date Noted  . Cough 12/31/2019  . Chest congestion 12/31/2019  . Hypertension, benign 12/03/2019  . Fibrocystic breast changes, bilateral 12/03/2019  . 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 Obstructive sleep apnea 12/30/2018  . Absolute anemia 01/09/2018  . Vitamin B12 deficiency 01/08/2018  . Vitamin D deficiency 01/08/2018  . Positive ANA (antinuclear antibody) 12/15/2017  . Primary osteoarthritis of right knee 05/13/2017  . Overweight (BMI 25.0-29.9) 11/19/2016  . White coat  hypertension 01/25/2016  . Dysmenorrhea 01/03/2016  . High cholesterol 01/01/2016  . History of ectopic pregnancy 11/29/2015  . Abnormal thyroid stimulating hormone (TSH) level 01/10/2015  . Allergic rhinitis, seasonal 01/10/2015  . Asymptomatic varicose veins of both lower extremities 01/10/2015      Current Outpatient Medications:  .  azithromycin (ZITHROMAX) 250 MG tablet, Take 2 tabs on day 1, then 1 tab daily for the next 4 days, Disp: 6 tablet, Rfl: 0 .  busPIRone (BUSPAR) 5 MG tablet, Take 1-2 tablets (5-10 mg total) by mouth 2 (two) times daily as needed (for anxiety/stress symptoms)., Disp: 120 tablet, Rfl: 1 .  celecoxib (CELEBREX) 100 MG capsule, Take by mouth., Disp: , Rfl:  .  gabapentin (NEURONTIN) 300 MG capsule, Take 300 mg by mouth daily as needed., Disp: , Rfl:  .  hydrochlorothiazide (HYDRODIURIL) 12.5 MG tablet, Take 12.5 mg by mouth daily., Disp: , Rfl:  .  losartan (COZAAR) 50 MG tablet, Take 2 tablets (100 mg total) by mouth daily., Disp: 60 tablet, Rfl: 6 .  VITAMIN D PO, Take by mouth once a week. , Disp: , Rfl:    Allergies  Allergen Reactions  . Contrave [Naltrexone-Bupropion Hcl Er]     Palpitations, hypertension     Past Surgical History:  Procedure Laterality Date  . CHOLECYSTECTOMY N/A 01/19/2016   Procedure: LAPAROSCOPIC CHOLECYSTECTOMY WITH INTRAOPERATIVE CHOLANGIOGRAM;  Surgeon: Kieth Brightly, MD;  Location: [facility] OR[facility];  Service: General;  Laterality: N/A;  .  DILITATION & CURRETTAGE/HYSTROSCOPY WITH NOVASURE ABLATION N/A 03/24/2017   Procedure: DILATATION & CURETTAGE/HYSTEROSCOPY WITH NOVASURE ABLATION;  Surgeon: Brayton Mars, MD;  Location: [facility] OR[facility];  Service: Gynecology;  Laterality: N/A;  . laser surgery on eye Right 10/2016     Family History  Problem Relation Age of Onset  . Hypertension Mother   . Thyroid disease Mother   . Kidney disease Mother   . Hypertension Father   . Cancer Father        Prostate  . Diabetes  Brother   . Diabetes Other   . Cancer Paternal Uncle        Prostate  . Diabetes Maternal Grandmother   . Cancer Paternal Grandfather        Prostate  . Diabetes Sister   . Schizophrenia Sister   . Breast cancer Neg Hx   . Ovarian cancer Neg Hx   . Heart disease Neg Hx      Social History   Tobacco Use  . Smoking status: Never Smoker  . Smokeless tobacco: Never Used  Substance Use Topics  . Alcohol use: No    Alcohol/week: 0.0 standard drinks    With staff assistance, above reviewed with the patient today.  ROS: As per HPI, otherwise no specific complaints on a limited and focused system review   No results found for this or any previous visit (from the past 72 hour(s)).   PHQ2/9: Depression screen [facility] 2/9 05/09/2020 04/24/2020 12/31/2019 12/23/2019 12/23/2019  Decreased Interest 0 0 0 0 0  Down, Depressed, Hopeless 0 0 0 0 0  PHQ - 2 Score 0 0 0 0 0  Altered sleeping 0 0 0 0 0  Tired, decreased energy 1 1 0 1 0  Change in appetite 0 0 0 0 0  Feeling bad or failure about yourself  0 0 0 0 0  Trouble concentrating 0 0 0 0 0  Moving slowly or fidgety/restless 0 0 0 0 0  Suicidal thoughts 0 0 0 0 0  PHQ-9 Score 1 1 0 1 0  Difficult doing work/chores Not difficult at all Not difficult at all - Not difficult at all Not difficult at all  Some recent data might be hidden   PHQ-2/9 Result is neg  Fall Risk: Fall Risk  05/09/2020 04/24/2020 04/19/2020 12/31/2019 12/23/2019  Falls in the past year? 0 0 0 0 0  Number falls in past yr: 0 0 0 0 0  Injury with Fall? 0 0 0 0 0  Follow up - - - - -      Objective:   Vitals:   05/09/20 1504  BP: 120/80  Pulse: 69  Resp: 16  Temp: 98.2 F (36.8 C)  TempSrc: Oral  SpO2: 98%  Weight: 191 lb 1.6 oz (86.7 kg)  Height: 5\' 8"  (1.727 m)    Body mass index is 29.06 kg/m.  Physical Exam    NAD, masked,pleasant, not ill-appearing. HEENT - [place]/AT, sclera anicteric, PERRL, conj - non-inj'ed, pharynx clear Neck - supple, no  rigidity, no adenopathy Car - RRR without m/g/r,not tachycardic Pulm- RR and effort normal at rest, CTA without wheeze or rales, Abd - soft, NTdiffusely,   Back - nofocalCVA tenderness, Neuro/psychiatric - affect was not flat, appropriate with conversation Alert  Speech normal   Results for orders placed or performed in visit on 04/24/20  POCT Urinalysis Dipstick  Result Value Ref Range   Color, UA Clear    Clarity, UA Clear  Glucose, UA Negative Negative   Bilirubin, UA Negative    Ketones, UA Negative    Spec Grav, UA 1.010 1.010 - 1.025   Blood, UA Negative    pH, UA 7.5 5.0 - 8.0   Protein, UA Negative Negative   Urobilinogen, UA 0.2 0.2 or 1.0 E.U./dL   Nitrite, UA Negative    Leukocytes, UA Negative Negative   Appearance Normal    Odor Normal        Assessment & Plan:   1. Bronchitis/cough Discussed concerns for an infectious source to her cough, with a bronchitis concern noted.  It has lasted for several days, not responding to over-the-counter entities to date. Noted in the setting of this Covid pandemic, Covid is still a possibility, especially noting her test was done only 1 to 2 days into her symptoms, and a test done that early can be negative in the setting of Covid.  She has had the vaccine and is due for her booster this month.  Has not lost taste or smell. Felt best to proceed as follows:  We will add a Z-Pak as directed - azithromycin (ZITHROMAX) 250 MG tablet; Take 2 tabs on day 1, then 1 tab daily for the next 4 days  Dispense: 6 tablet; Refill: 0  Also recommended continuing the Mucinex products in the short-term as needed to help as an expectorant. Can use Tylenol products in the short-term as well as needed. Relative rest, staying hydrated recommended, and emphasized if symptoms not improving or more problematic as we progress through the week into the weekend, she needs to follow-up, and would recommend getting another  Covid test at that time.  She was understanding of the recommendations, await her response to the above.       Jamelle Haring, MD 05/09/20 3:21 PM

## 2020-05-09 NOTE — Patient Instructions (Signed)
Please pick up the antibiotic prescribed to your pharmacy and start today.  Continue taking the Mucinex product in the short-term as needed  Also can use Tylenol products as needed

## 2020-05-12 ENCOUNTER — Other Ambulatory Visit: Payer: BC Managed Care – PPO

## 2020-05-12 DIAGNOSIS — Z20822 Contact with and (suspected) exposure to covid-19: Secondary | ICD-10-CM

## 2020-05-15 DIAGNOSIS — G4733 Obstructive sleep apnea (adult) (pediatric): Secondary | ICD-10-CM | POA: Diagnosis not present

## 2020-05-16 LAB — NOVEL CORONAVIRUS, NAA: SARS-CoV-2, NAA: NOT DETECTED

## 2020-05-23 NOTE — Progress Notes (Deleted)
Patient is a 50 year old female patient of Danelle Berry She was seen by me 05/09/2020 for cough A chest x-ray obtained at her prior visit in late December was negative. She follows up today with cough returning.  Prior to last visit, her cough started about 6 days prior, was occasionally productive of a yellowish/cloudy mucus.  More productive in the morning. She had coughing spells that were almost attack like.  Denied fevers, sinus congestion or marked postnasal drip, Has not lost her taste or smell, denied loose bowel movements or diarrhea.  Denies any contacts or exposures to Covid, and she did get tested for Covid on 27 December,a couple days into her symptoms and noted that was negative.  She had no shortness of breath or chest pain concerns. She has had the Covid vaccine x2, and due for her booster in January. No tobacco history No diabetes history My assessment/plan was as follows:  1. Bronchitis/cough Discussed concerns for an infectious source to her cough, with a bronchitis concern noted.  It has lasted for several days, not responding to over-the-counter entities to date. Noted in the setting of this Covid pandemic, Covid is still a possibility, especially noting her test was done only 1 to 2 days into her symptoms, and a test done that early can be negative in the setting of Covid.  She has had the vaccine and is due for her booster this month.  Has not lost taste or smell.  Felt best to proceed as follows: We will add a Z-Pak as directed Also recommended continuing the Mucinex products in the short-term as needed to help as an expectorant. Can use Tylenol products in the short-term as well as needed. Relative rest, staying hydrated recommended, and emphasized if symptoms not improving or more problematic as we progress through the week into the weekend, she needs to follow-up, and would recommend getting another Covid test at that time.  She follows up today noting that her cough has  returned.      NAD, masked,pleasant, not ill-appearing. HEENT - [place]/AT, sclera anicteric, PERRL, conj - non-inj'ed, pharynx clear Neck - supple, norigidity, no adenopathy Car - RRR without m/g/r,not tachycardic Pulm- RR and effort normal at rest, CTA without wheeze or rales, Abd - soft, NTdiffusely,   Back - nofocalCVA tenderness, Neuro/psychiatric - affect was not flat, appropriate with conversation Alert  Speech normal

## 2020-05-24 ENCOUNTER — Ambulatory Visit: Payer: BC Managed Care – PPO | Admitting: Internal Medicine

## 2020-06-13 ENCOUNTER — Encounter: Payer: Self-pay | Admitting: Pulmonary Disease

## 2020-06-13 ENCOUNTER — Ambulatory Visit: Payer: BC Managed Care – PPO | Admitting: Pulmonary Disease

## 2020-06-13 ENCOUNTER — Other Ambulatory Visit: Payer: Self-pay

## 2020-06-13 VITALS — BP 116/80 | HR 67 | Temp 97.2°F | Ht 68.0 in | Wt 191.0 lb

## 2020-06-13 DIAGNOSIS — R0602 Shortness of breath: Secondary | ICD-10-CM | POA: Diagnosis not present

## 2020-06-13 NOTE — Progress Notes (Signed)
Andrea Pope    [id number]    Mar 17, 1971  Primary Care Physician:Tapia, Jimmy Footman  Referring Physician: Danelle Berry, PA-C [address] [place],  [place] [address]  Chief complaint:   Shortness of breath, tachycardia  HPI:  Diagnosis obstructive sleep apnea Did start CPAP and noted improvement in symptoms She has not been using the CPAP since the recall She is waiting for a new machine  She still does get short of breath with exertion  Echocardiogram within normal limits Cardiac CT-lung structure within normal limit Does not smoke, no significant exposure history  Exercise tolerance is unchanged since last visit  Has no underlying lung disease No underlying heart disease No wheezing No family history of heart disease Mother has hypothyroidism  She has a dog No recent travel  Never smoker  Works in childcare  Outpatient Encounter Medications as of 06/13/2020  Medication Sig  . azithromycin (ZITHROMAX) 250 MG tablet Take 2 tabs on day 1, then 1 tab daily for the next 4 days  . busPIRone (BUSPAR) 5 MG tablet Take 1-2 tablets (5-10 mg total) by mouth 2 (two) times daily as needed (for anxiety/stress symptoms).  . celecoxib (CELEBREX) 100 MG capsule Take by mouth.  . gabapentin (NEURONTIN) 300 MG capsule Take 300 mg by mouth daily as needed.  . hydrochlorothiazide (HYDRODIURIL) 12.5 MG tablet Take 12.5 mg by mouth daily.  Marland Kitchen losartan (COZAAR) 50 MG tablet Take 2 tablets (100 mg total) by mouth daily.  Marland Kitchen VITAMIN D PO Take by mouth once a week.    No facility-administered encounter medications on file as of 06/13/2020.    Allergies as of 06/13/2020 - Review Complete 06/13/2020  Allergen Reaction Noted  . Contrave [naltrexone-bupropion hcl er]  05/06/2019    Past Medical History:  Diagnosis Date  . Abnormal thyroid stimulating hormone level   . Allergy   . Anemia    iron deficiency  . Encounter for initial prescription of injectable  contraceptive   . Female stress incontinence   . Gallstones 12/05/2015  . High cholesterol   . History of ectopic pregnancy 11/29/2015  . Hypertension   . Menorrhagia with irregular cycle   . Neutropenia (HCC) 07/23/2016  . Ovarian cyst 11/29/2015  . Ovarian cyst 11/29/2015  . 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 Proteinuria 08/14/2015  . Spider veins of both lower extremities   . 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 CHOLECYSTECTOMY N/A 01/19/2016   Procedure: LAPAROSCOPIC CHOLECYSTECTOMY WITH INTRAOPERATIVE CHOLANGIOGRAM;  Surgeon: Kieth Brightly, MD;  Location: [facility] ORS;  Service: General;  Laterality: N/A;  . DILITATION & CURRETTAGE/HYSTROSCOPY WITH NOVASURE ABLATION N/A 03/24/2017   Procedure: DILATATION & CURETTAGE/HYSTEROSCOPY WITH NOVASURE ABLATION;  Surgeon: Herold Harms, MD;  Location: [facility] ORS;  Service: Gynecology;  Laterality: N/A;  . laser surgery on eye Right 10/2016    Family History  Problem Relation Age of Onset  . Hypertension Mother   . Thyroid disease Mother   . Kidney disease Mother   . Hypertension Father   . Cancer Father        Prostate  . Diabetes Brother   . Diabetes Other   . Cancer Paternal Uncle        Prostate  . Diabetes Maternal Grandmother   .  Cancer Paternal Grandfather        Prostate  . Diabetes Sister   . Schizophrenia Sister   . Breast cancer Neg Hx   . Ovarian cancer Neg Hx   . Heart disease Neg Hx     Social History   Socioeconomic History  . Marital status: Married    Spouse name: Ron   . Number of children: 4  . Years of education: Not on file  . Highest education level: Bachelor's degree (e.g., BA, AB, BS)  Occupational History  . Occupation: Runner, broadcasting/film/video  Tobacco Use   . Smoking status: Never Smoker  . Smokeless tobacco: Never Used  Vaping Use  . Vaping Use: Never used  Substance and Sexual Activity  . Alcohol use: No    Alcohol/week: 0.0 standard drinks  . Drug use: No  . Sexual activity: Yes    Partners: Male    Birth control/protection: None  Other Topics Concern  . Not on file  Social History Narrative  . Not on file   Social Determinants of Health   Financial Resource Strain: Not on file  Food Insecurity: Not on file  Transportation Needs: Not on file  Physical Activity: Not on file  Stress: Not on file  Social Connections: Not on file  Intimate Partner Violence: Not on file    Review of Systems  Constitutional: Positive for fatigue.  HENT: Negative.   Respiratory: Positive for apnea.   Cardiovascular: Positive for palpitations. Negative for chest pain.  Psychiatric/Behavioral: Positive for sleep disturbance.    Vitals:   06/13/20 1508  BP: 116/80  Pulse: 67  Temp: (!) 97.2 F (36.2 C)  SpO2: 100%     Physical Exam Constitutional:      Appearance: She is well-developed.  HENT:     Mouth/Throat:     Mouth: Mucous membranes are moist.  Eyes:     General:        Right eye: No discharge.        Left eye: No discharge.  Neck:     Thyroid: No thyromegaly.     Trachea: No tracheal deviation.  Cardiovascular:     Rate and Rhythm: Normal rate and regular rhythm.     Heart sounds: No murmur heard. No friction rub.  Pulmonary:     Effort: Pulmonary effort is normal. No respiratory distress.     Breath sounds: Normal breath sounds. No wheezing or rales.  Chest:     Chest wall: No tenderness.  Neurological:     Mental Status: She is alert.  Psychiatric:        Mood and Affect: Mood normal.    Data Reviewed: Sleep study did reveal mild obstructive sleep apnea  Assessment:  Shortness of breath -We will obtain a pulmonary function test  Obstructive sleep apnea -Reinitiate CPAP once she gets a new  machine  Xanax for anxiety  Plan/Recommendations: Continue graded exercises as tolerated  Follow-up with endocrinology  Obtain a pulmonary function test  Tentative follow-up in 6 months  Call with significant concerns   Virl Diamond MD [facility] 06/13/2020, 3:14 PM  CC: Danelle Berry, PA-C

## 2020-06-13 NOTE — Patient Instructions (Addendum)
Persistence of shortness of breath  We will get a breathing study on you  Your recent CT scan of the heart did not show any significant abnormality in your lung structure Your echocardiogram was within normal limits  -The above is reassuring  We will call you with the results of the breathing study once available  Follow-up in 6 months  Resume CPAP once you get a new machine

## 2020-06-15 DIAGNOSIS — G4733 Obstructive sleep apnea (adult) (pediatric): Secondary | ICD-10-CM | POA: Diagnosis not present

## 2020-06-20 ENCOUNTER — Other Ambulatory Visit (HOSPITAL_COMMUNITY): Payer: BC Managed Care – PPO

## 2020-07-13 DIAGNOSIS — M222X1 Patellofemoral disorders, right knee: Secondary | ICD-10-CM | POA: Diagnosis not present

## 2020-07-13 DIAGNOSIS — G4733 Obstructive sleep apnea (adult) (pediatric): Secondary | ICD-10-CM | POA: Diagnosis not present

## 2020-07-13 DIAGNOSIS — M222X2 Patellofemoral disorders, left knee: Secondary | ICD-10-CM | POA: Diagnosis not present

## 2020-07-25 ENCOUNTER — Ambulatory Visit: Payer: Self-pay | Admitting: *Deleted

## 2020-07-25 DIAGNOSIS — R1013 Epigastric pain: Secondary | ICD-10-CM | POA: Diagnosis not present

## 2020-07-25 DIAGNOSIS — Z1211 Encounter for screening for malignant neoplasm of colon: Secondary | ICD-10-CM | POA: Diagnosis not present

## 2020-07-25 NOTE — Telephone Encounter (Signed)
Patient is calling to report she has been having dizziness after eating for 1 month. Patient states she feels weak and it normally passes within 30 minutes- but it is becoming more frequent. Call to office- appointment scheduled. Reason for Disposition . [1] MILD dizziness (e.g., walking normally) AND [2] has NOT been evaluated by physician for this  (Exception: dizziness caused by heat exposure, sudden standing, or poor fluid intake)  Answer Assessment - Initial Assessment Questions 1. DESCRIPTION: "Describe your dizziness."     Feels weak- needs to sit 2. LIGHTHEADED: "Do you feel lightheaded?" (e.g., somewhat faint, woozy, weak upon standing)    Yes- faint 3. VERTIGO: "Do you feel like either you or the room is spinning or tilting?" (i.e. vertigo)     no 4. SEVERITY: "How bad is it?"  "Do you feel like you are going to faint?" "Can you stand and walk?"   - MILD: Feels slightly dizzy, but walking normally.   - MODERATE: Feels very unsteady when walking, but not falling; interferes with normal activities (e.g., school, work) .   - SEVERE: Unable to walk without falling, or requires assistance to walk without falling; feels like passing out now.      mild 5. ONSET:  "When did the dizziness begin?"     1 month- today she felt worse- after eating always 6. AGGRAVATING FACTORS: "Does anything make it worse?" (e.g., standing, change in head position)     Gets jittery before eats- then gets dizziness. Lasting about 30 minutes 7. HEART RATE: "Can you tell me your heart rate?" "How many beats in 15 seconds?"  (Note: not all patients can do this)       Normal pulse 8. CAUSE: "What do you think is causing the dizziness?"     Unsure- related to eating 9. RECURRENT SYMPTOM: "Have you had dizziness before?" If Yes, ask: "When was the last time?" "What happened that time?"     Sometimes- more noticeable now 10. OTHER SYMPTOMS: "Do you have any other symptoms?" (e.g., fever, chest pain, vomiting,  diarrhea, bleeding)       no 11. PREGNANCY: "Is there any chance you are pregnant?" "When was your last menstrual period?"       n/a  Protocols used: DIZZINESS [facility]

## 2020-07-26 ENCOUNTER — Encounter: Payer: Self-pay | Admitting: Family Medicine

## 2020-07-26 ENCOUNTER — Other Ambulatory Visit: Payer: Self-pay

## 2020-07-26 ENCOUNTER — Ambulatory Visit (INDEPENDENT_AMBULATORY_CARE_PROVIDER_SITE_OTHER): Payer: BC Managed Care – PPO | Admitting: Family Medicine

## 2020-07-26 VITALS — BP 110/60 | HR 80 | Temp 98.4°F | Resp 16 | Ht 68.0 in | Wt 195.6 lb

## 2020-07-26 DIAGNOSIS — R42 Dizziness and giddiness: Secondary | ICD-10-CM

## 2020-07-26 DIAGNOSIS — I10 Essential (primary) hypertension: Secondary | ICD-10-CM

## 2020-07-26 MED ORDER — LOSARTAN POTASSIUM 50 MG PO TABS: 50.0000 mg | ORAL_TABLET | Freq: Every day | ORAL | 6 refills | Status: DC

## 2020-07-26 NOTE — Patient Instructions (Signed)
It was great to see you!  Our plans for today:  - Stop your hydrocholorothiazide. - Reduce your losartan to 50mg  daily. - Keep a log of when you are having dizziness as well as other symptoms you have during this time and the timing and bring this with you to follow up. - We are checking some labs today, we will release these results to your MyChart. - Come back in 4 weeks for follow up.  Take care and seek immediate care sooner if you develop any concerns.   Dr. 

## 2020-07-26 NOTE — Progress Notes (Signed)
    SUBJECTIVE:   CHIEF COMPLAINT / HPI:   DIZZINESS - noted after eating for the past month - gets lightheaded and jitteriness before eating.  - brother and sister with diabetes.   Duration: 1 month Description of symptoms: lightheaded Duration of episode: hours Dizziness frequency: every day, mostly in afternoon Provoking factors: only with eating. Before and after Triggered by rolling over in bed: no Triggered by bending over: yes Aggravated by head movement: no Aggravated by exertion, coughing, loud noises: no Recent head injury: no Recent or current viral symptoms: no History of vasovagal episodes: no Nausea: no Vomiting: no Tinnitus: no Hearing loss: no Aural fullness: no Headache: no Photophobia/phonophobia: no Unsteady gait: no Postural instability: no Diplopia, dysarthria, dysphagia or weakness: no Related to exertion: no Pallor: no Diaphoresis: no Dyspnea: no Chest pain: no   Did not take HCTZ today. Took losartan last night at half dose, 50mg .  No other med changes recently.   OBJECTIVE:   BP 110/60   Pulse 80   Temp 98.4 F (36.9 C) (Oral)   Resp 16   Ht 5\' 8"  (1.727 m)   Wt 195 lb 9.6 oz (88.7 kg)   SpO2 99%   BMI 29.74 kg/m   Gen: well appearing, in NAD Card: RRR Lungs: CTAB Ext: WWP, no edema MSK: Full ROM, strength 5/5 to U/LE bilaterally, normal gait.  No edema.  Neuro: Alert and oriented, speech normal. Optic field normal. PERRL, Extraocular movements intact.  Intact symmetric sensation to light touch of face and extremities bilaterally.  Hearing grossly intact bilaterally.  Tongue protrudes normally with no deviation.  Shoulder shrug, smile symmetric. Finger to nose normal.  ASSESSMENT/PLAN:   Dizziness Likely 2/2 lower BP, low normal in office today with +orthostatics. Neuro exam wnl. Will stop HCTZ and decrease losartan dose. Will also obtain a1c given occurs around meal times and +FH of diabetes. Obtaining bmp, cbc as well. Keep  log of symptoms and f/u in 4 weeks.     , DO

## 2020-07-26 NOTE — Assessment & Plan Note (Addendum)
Likely 2/2 lower BP, low normal in office today with +orthostatics. Neuro exam wnl. Will stop HCTZ and decrease losartan dose. Will also obtain a1c given occurs around meal times and +FH of diabetes. Obtaining bmp, cbc as well. Keep log of symptoms and f/u in 4 weeks.

## 2020-07-27 LAB — HEMOGLOBIN A1C
Hgb A1c MFr Bld: 5.6 % of total Hgb (ref <5.7)
Mean Plasma Glucose: 114 mg/dL
eAG (mmol/L): 6.3 mmol/L

## 2020-07-27 LAB — BASIC METABOLIC PANEL
BUN: 17 mg/dL (ref 7–25)
CO2: 28 mmol/L (ref 20–32)
Calcium: 9.3 mg/dL (ref 8.6–10.2)
Chloride: 101 mmol/L (ref 98–110)
Creat: 0.9 mg/dL (ref 0.50–1.10)
Glucose, Bld: 81 mg/dL (ref 65–99)
Potassium: 4.1 mmol/L (ref 3.5–5.3)
Sodium: 136 mmol/L (ref 135–146)

## 2020-07-27 LAB — CBC WITH DIFFERENTIAL/PLATELET
Absolute Monocytes: 614 cells/uL (ref 200–950)
Basophils Absolute: 42 cells/uL (ref 0–200)
Basophils Relative: 0.8 %
Eosinophils Absolute: 239 cells/uL (ref 15–500)
Eosinophils Relative: 4.6 %
HCT: 38.7 % (ref 35.0–45.0)
Hemoglobin: 13.1 g/dL (ref 11.7–15.5)
Lymphs Abs: 2184 cells/uL (ref 850–3900)
MCH: 31.1 pg (ref 27.0–33.0)
MCHC: 33.9 g/dL (ref 32.0–36.0)
MCV: 91.9 fL (ref 80.0–100.0)
MPV: 10.7 fL (ref 7.5–12.5)
Monocytes Relative: 11.8 %
Neutro Abs: 2122 cells/uL (ref 1500–7800)
Neutrophils Relative %: 40.8 %
Platelets: 340 10*3/uL (ref 140–400)
RBC: 4.21 10*6/uL (ref 3.80–5.10)
RDW: 12.3 % (ref 11.0–15.0)
Total Lymphocyte: 42 %
WBC: 5.2 10*3/uL (ref 3.8–10.8)

## 2020-07-31 ENCOUNTER — Telehealth: Payer: Self-pay | Admitting: Cardiology

## 2020-07-31 NOTE — Telephone Encounter (Signed)
Spoke with patient and she stated that Dr.Rumball stopped her HCTZ and reduced her Losartan to 50 MG daily last week. She is still feeling light headed. I offered her an appointment with an APP for this week and she stated she wanted to wait for her follow up with Dr. Azucena Cecil next week on 08/11/20.

## 2020-07-31 NOTE — Telephone Encounter (Signed)
Pt c/o medication issue:  1. Name of Medication: losartan   2. How are you currently taking this medication (dosage and times per day)? 50 MG 1 tablet daily  3. Are you having a reaction (difficulty breathing--STAT)? Lightheadedness   4. What is your medication issue? Patient calling, states that another provider has made changes to medications but she is still experiencing lightlessness.  Patient is scheduled with Dr Azucena Cecil on 04/08.  Please call to discuss.

## 2020-08-11 ENCOUNTER — Ambulatory Visit: Payer: BC Managed Care – PPO | Admitting: Cardiology

## 2020-08-11 ENCOUNTER — Encounter: Payer: Self-pay | Admitting: Cardiology

## 2020-08-11 ENCOUNTER — Other Ambulatory Visit: Payer: Self-pay

## 2020-08-11 VITALS — BP 142/91 | HR 60 | Ht 68.0 in | Wt 198.0 lb

## 2020-08-11 DIAGNOSIS — R42 Dizziness and giddiness: Secondary | ICD-10-CM | POA: Diagnosis not present

## 2020-08-11 DIAGNOSIS — E7800 Pure hypercholesterolemia, unspecified: Secondary | ICD-10-CM

## 2020-08-11 DIAGNOSIS — I10 Essential (primary) hypertension: Secondary | ICD-10-CM | POA: Diagnosis not present

## 2020-08-11 MED ORDER — LOSARTAN POTASSIUM 100 MG PO TABS: 100.0000 mg | ORAL_TABLET | Freq: Every day | ORAL | 3 refills | Status: DC

## 2020-08-11 NOTE — Progress Notes (Signed)
Cardiology Office Note:    Date:  08/11/2020   ID:  Andrea Pope, DOB March 04, 1971, MRN [id number]  PCP:  Danelle Berry, PA-C  Cardiologist:  Debbe Odea, MD  Electrophysiologist:  None   Referring MD: Danelle Berry, PA-C   Chief Complaint  Patient presents with  . Other    Patient c.o worsening lightheaded - Happening every morning. Meds reviewed verbally with patient.     History of Present Illness:    Andrea Pope is a 50 y.o. female with history of hypertension, anxiety who presents due to dizziness/lightheadedness.  Patient states having symptoms of dizziness/lightheaded over the past several weeks.  Symptoms usually occur when she gets out of her car, moving from laying to sitting or stands up from a sitting position.  She previously was on losartan 100 mg, HCTZ 12.5 mg daily.  She states her blood pressure was low, HCTZ was stopped, losartan was decreased to 50 mg by PCP.  Her symptoms still persist despite reduction in antihypertensives.  Blood pressure is now elevated in the 140s to 150s systolic.   Prior notes Echocardiogram 12/2019 shows normal systolic and diastolic function, EF 60 to 65%. Coronary CTA 01/2020, calcium score 0, no evidence of CAD.   Past Medical History:  Diagnosis Date  . Abnormal thyroid stimulating hormone level   . Allergy   . Anemia    iron deficiency  . Encounter for initial prescription of injectable contraceptive   . Female stress incontinence   . Gallstones 12/05/2015  . High cholesterol   . History of ectopic pregnancy 11/29/2015  . Hypertension   . Menorrhagia with irregular cycle   . Neutropenia (HCC) 07/23/2016  . Ovarian cyst 11/29/2015  . Ovarian cyst 11/29/2015  . 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 Proteinuria 08/14/2015  . Spider veins of both lower extremities   . Status post D&C/hysteroscopy/endometrial ablation 03/24/2017   Good TVH candidate 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 Pathology: Proliferative  endometrium with breakdown changes; negative for atypia/hyperplasia/carcinoma    Past Surgical History:  Procedure Laterality Date  . CHOLECYSTECTOMY N/A 01/19/2016   Procedure: LAPAROSCOPIC CHOLECYSTECTOMY WITH INTRAOPERATIVE CHOLANGIOGRAM;  Surgeon: Kieth Brightly, MD;  Location: [facility] ORS;  Service: General;  Laterality: N/A;  . DILITATION & CURRETTAGE/HYSTROSCOPY WITH NOVASURE ABLATION N/A 03/24/2017   Procedure: DILATATION & CURETTAGE/HYSTEROSCOPY WITH NOVASURE ABLATION;  Surgeon: Herold Harms, MD;  Location: [facility] ORS;  Service: Gynecology;  Laterality: N/A;  . laser surgery on eye Right 10/2016    Current Medications: Current Meds  Medication Sig  . ALPRAZolam (XANAX) 0.5 MG tablet alprazolam 0.5 mg tablet  . busPIRone (BUSPAR) 5 MG tablet Take 1-2 tablets (5-10 mg total) by mouth 2 (two) times daily as needed (for anxiety/stress symptoms).  . celecoxib (CELEBREX) 100 MG capsule Take by mouth.  . ergocalciferol (VITAMIN D2) 1.25 MG (50000 UT) capsule ergocalciferol (vitamin D2) 1,250 mcg (50,000 unit) capsule  . gabapentin (NEURONTIN) 300 MG capsule Take 300 mg by mouth daily as needed.  . halcinonide (HALOG) 0.1 % external solution Halog 0.1 % topical solution  . losartan (COZAAR) 100 MG tablet Take 1 tablet (100 mg total) by mouth daily.  . sertraline (ZOLOFT) 25 MG tablet sertraline 25 mg tablet  . VITAMIN D PO Take by mouth once a week.   . [DISCONTINUED] losartan (COZAAR) 50 MG tablet Take 1 tablet (50 mg total) by mouth daily.     Allergies:   Contrave [naltrexone-bupropion hcl er]   Social History  Socioeconomic History  . Marital status: Married    Spouse name: Ron   . Number of children: 4  . Years of education: Not on file  . Highest education level: Bachelor's degree (e.g., BA, AB, BS)  Occupational History  . Occupation: Runner, broadcasting/film/video  Tobacco Use  . Smoking status: Never Smoker  . Smokeless tobacco: Never Used  Vaping Use  . Vaping Use: Never  used  Substance and Sexual Activity  . Alcohol use: No    Alcohol/week: 0.0 standard drinks  . Drug use: No  . Sexual activity: Yes    Partners: Male    Birth control/protection: None  Other Topics Concern  . Not on file  Social History Narrative  . Not on file   Social Determinants of Health   Financial Resource Strain: Not on file  Food Insecurity: Not on file  Transportation Needs: Not on file  Physical Activity: Not on file  Stress: Not on file  Social Connections: Not on file     Family History: The patient's family history includes Cancer in her father, paternal grandfather, and paternal uncle; Diabetes in her brother, maternal grandmother, sister, and another family member; Hypertension in her father and mother; Kidney disease in her mother; Schizophrenia in her sister; Thyroid disease in her mother. There is no history of Breast cancer, Ovarian cancer, or Heart disease.  ROS:   Please see the history of present illness.     All other systems reviewed and are negative.  EKGs/Labs/Other Studies Reviewed:    The following studies were reviewed today:   EKG:  EKG is  ordered today.  The ekg ordered today demonstrates normal sinus rhythm, normal ECG.  Recent Labs: 12/23/2019: ALT 11; TSH 0.67 07/26/2020: BUN 17; Creat 0.90; Hemoglobin 13.1; Platelets 340; Potassium 4.1; Sodium 136  Recent Lipid Panel    Component Value Date/Time   CHOL 249 (H) 12/23/2019 1019   TRIG 101 12/23/2019 1019   HDL 58 12/23/2019 1019   CHOLHDL 4.3 12/23/2019 1019   VLDL 21 11/19/2016 1513   LDLCALC 169 (H) 12/23/2019 1019    Physical Exam:    VS:  BP (!) 142/91 (BP Location: Left Arm, Patient Position: Sitting, Cuff Size: Normal)   Pulse 60   Ht 5\' 8"  (1.727 m)   Wt 198 lb (89.8 kg)   BMI 30.11 kg/m     Wt Readings from Last 3 Encounters:  08/11/20 198 lb (89.8 kg)  07/26/20 195 lb 9.6 oz (88.7 kg)  06/13/20 191 lb (86.6 kg)     GEN:  Well nourished, well developed in no  acute distress HEENT: Normal NECK: No JVD; No carotid bruits LYMPHATICS: No lymphadenopathy CARDIAC: RRR, no murmurs, rubs, gallops RESPIRATORY:  Clear to auscultation without rales, wheezing or rhonchi  ABDOMEN: Soft, non-tender, non-distended MUSCULOSKELETAL:  No edema; No deformity  SKIN: Warm and dry NEUROLOGIC:  Alert and oriented x 3 PSYCHIATRIC:  Normal affect   ASSESSMENT:    1. Dizziness   2. Primary hypertension   3. Pure hypercholesterolemia    PLAN:    In order of problems listed above:  1. Patient with dizziness, associated with positional changes.  Denies palpitations.  Symptoms consistent with classic positional vertigo.  Orthostatic vitals in the office did not show any evidence for orthostasis.  Recommend trial of Antivert/vertigo management as per primary care physician. 2. History of hypertension, BP elevated.  Increase losartan to 100 mg daily. 3. Hyperlipidemia, 10-year ASCVD risk 4.8%.  Patient not in statin  benefit group.  Low-cholesterol diet recommended.  Follow-up in 3 months for BP check    This note was generated in part or whole with voice recognition software. Voice recognition is usually quite accurate but there are transcription errors that can and very often do occur. I apologize for any typographical errors that were not detected and corrected.  Medication Adjustments/Labs and Tests Ordered: Current medicines are reviewed at length with the patient today.  Concerns regarding medicines are outlined above.  Orders Placed This Encounter  Procedures  . EKG 12-Lead   Meds ordered this encounter  Medications  . losartan (COZAAR) 100 MG tablet    Sig: Take 1 tablet (100 mg total) by mouth daily.    Dispense:  30 tablet    Refill:  3    Patient Instructions  Medication Instructions:   Your physician has recommended you make the following change in your medication:   1.  INCREASE your Losartan to 100 MG daily.  *If you need a refill on your  cardiac medications before your next appointment, please call your pharmacy*   Lab Work: None ordered If you have labs (blood work) drawn today and your tests are completely normal, you will receive your results only by: Marland Kitchen MyChart Message (if you have MyChart) OR . A paper copy in the mail If you have any lab test that is abnormal or we need to change your treatment, we will call you to review the results.   Testing/Procedures: None ordered   Follow-Up: At [facility], you and your health needs are our priority.  As part of our continuing mission to provide you with exceptional heart care, we have created designated Provider Care Teams.  These Care Teams include your primary Cardiologist (physician) and Advanced Practice Providers (APPs -  Physician Assistants and Nurse Practitioners) who all work together to provide you with the care you need, when you need it.  We recommend signing up for the patient portal called "MyChart".  Sign up information is provided on this After Visit Summary.  MyChart is used to connect with patients for Virtual Visits (Telemedicine).  Patients are able to view lab/test results, encounter notes, upcoming appointments, etc.  Non-urgent messages can be sent to your provider as well.   To learn more about what you can do with MyChart, go to [email or web address].    Your next appointment:   3 month(s)  The format for your next appointment:   In Person  Provider:   Debbe Odea, MD   Other Instructions       Signed, Debbe Odea, MD  08/11/2020 12:32 PM    [facility]

## 2020-08-11 NOTE — Patient Instructions (Signed)
Medication Instructions:   Your physician has recommended you make the following change in your medication:   1.  INCREASE your Losartan to 100 MG daily.  *If you need a refill on your cardiac medications before your next appointment, please call your pharmacy*   Lab Work: None ordered If you have labs (blood work) drawn today and your tests are completely normal, you will receive your results only by: Marland Kitchen MyChart Message (if you have MyChart) OR . A paper copy in the mail If you have any lab test that is abnormal or we need to change your treatment, we will call you to review the results.   Testing/Procedures: None ordered   Follow-Up: At [facility], you and your health needs are our priority.  As part of our continuing mission to provide you with exceptional heart care, we have created designated Provider Care Teams.  These Care Teams include your primary Cardiologist (physician) and Advanced Practice Providers (APPs -  Physician Assistants and Nurse Practitioners) who all work together to provide you with the care you need, when you need it.  We recommend signing up for the patient portal called "MyChart".  Sign up information is provided on this After Visit Summary.  MyChart is used to connect with patients for Virtual Visits (Telemedicine).  Patients are able to view lab/test results, encounter notes, upcoming appointments, etc.  Non-urgent messages can be sent to your provider as well.   To learn more about what you can do with MyChart, go to [email or web address].    Your next appointment:   3 month(s)  The format for your next appointment:   In Person  Provider:   Debbe Odea, MD   Other Instructions

## 2020-08-13 DIAGNOSIS — G4733 Obstructive sleep apnea (adult) (pediatric): Secondary | ICD-10-CM | POA: Diagnosis not present

## 2020-08-23 ENCOUNTER — Ambulatory Visit: Payer: BC Managed Care – PPO | Admitting: Physician Assistant

## 2020-08-28 ENCOUNTER — Telehealth: Payer: Self-pay

## 2020-08-28 NOTE — Telephone Encounter (Signed)
Do we have anything sooner with any provider?

## 2020-08-28 NOTE — Telephone Encounter (Signed)
Copied from CRM [id number]. Topic: General - Other >> Aug 28, 2020  2:32 PM Gaetana Michaelis A wrote: Reason for CRM: Patient has made contact requesting orders to be submitted to the [facility] breast center for the patient to have a diagnostic mammogram  Patient has contacted [facility] regarding scheduling and been directed to contact their pcp to request orders  Please contact to further advise if needed

## 2020-09-01 ENCOUNTER — Encounter: Payer: Self-pay | Admitting: Family Medicine

## 2020-09-01 ENCOUNTER — Other Ambulatory Visit: Payer: Self-pay

## 2020-09-01 ENCOUNTER — Ambulatory Visit (INDEPENDENT_AMBULATORY_CARE_PROVIDER_SITE_OTHER): Payer: BC Managed Care – PPO | Admitting: Family Medicine

## 2020-09-01 VITALS — BP 110/68 | HR 67 | Temp 98.3°F | Resp 16 | Ht 68.0 in | Wt 197.0 lb

## 2020-09-01 DIAGNOSIS — N6011 Diffuse cystic mastopathy of right breast: Secondary | ICD-10-CM | POA: Diagnosis not present

## 2020-09-01 DIAGNOSIS — N6012 Diffuse cystic mastopathy of left breast: Secondary | ICD-10-CM | POA: Diagnosis not present

## 2020-09-01 NOTE — Patient Instructions (Signed)
Fibrocystic Breast Changes  Fibrocystic breast changes are changes that can make your breasts swollen or painful. These changes happen when there is scar-like tissue (fibrous tissue) in the breasts or tiny sacs of fluid (cysts) form in the breast. This is a common condition. It does not mean that you have cancer. It usually happens because of hormone changes during a monthly period. What are the causes? The exact cause of this condition is not known. However, you are more likely to have it:  If you have high levels of female hormones.  If your mother had the same condition (inherited). What are the signs or symptoms? Symptoms of this condition include:  Tenderness, swelling, mild discomfort, or pain.  Rope-like tissue that can be felt when touching the breast.  Lumps in one or both breasts.  Changes in breast size. Breasts may get larger before your period and smaller after your period.  Discharge from the nipple. How is this treated? In many cases, there is no treatment for this condition. In some cases, you may need treatment, including:  Taking medicines.  Avoiding caffeine.  Reducing the amount of sugar and fat in your diet. You may also have:  A procedure to remove fluid from a cyst.  Surgery to remove a cyst that is large or tender or does not go away.  Medicines that may lower female hormones in the body. Follow these instructions at home: Self care Check your breasts after every monthly period. If you do not have monthly periods, check your breasts on the first day of every month. Check for:  Soreness.  New swelling or puffiness.  A change in breast size.  A change in a lump that was already there. General instructions  Take over-the-counter and prescription medicines only as told by your doctor.  Wear a support or sports bra that fits well. Wear this support especially when you are exercising.  If told by your doctor, avoid or have less caffeine, fat, and  sugar in what you eat and drink.  Keep all follow-up visits as told by your doctor. This is important. Contact a doctor if:  You have fluid coming from your nipple, especially if the fluid has blood in it.  You have new lumps or bumps in your breast.  Your breast gets puffy, red, and painful.  You have changes in how your breast looks.  Your nipple looks flat or it sinks into your breast. Get help right away if:  Your breast turns red, and the redness is spreading. Summary  Fibrocystic breast changes are changes that can make your breasts swollen or painful.  This condition can happen when you have hormone changes during your monthly period.  Check your breasts after every monthly period. If you do not have monthly periods, check your breasts on the first day of every month. This information is not intended to replace advice given to you by your health care provider. Make sure you discuss any questions you have with your health care provider. Document Revised: 04/05/2019 Document Reviewed: 04/05/2019 Elsevier Patient Education  2021 Elsevier Inc.  

## 2020-09-01 NOTE — Progress Notes (Signed)
4/29/20222:27 PM  EUREKA VALDES 06/19/1970, 50 y.o., female [id number]  Chief Complaint  Patient presents with  . Breast Problem    New found lump on left breast at 11 position, tender    HPI:   Patient is a 50 y.o. female with past medical history significant for HTN who presents today for breast lump.  Recently noticed a new lump on her left breast  Found a lump a few months ago Had a mammogram then and found cysts Last Mammogram 12/13/19:  IMPRESSION: 1. No mammographic evidence of malignancy. No suspicious mammographic etiology for nonfocal breast pain. Fluctuating cysts are seen bilaterally which could reflect a source of pain. 2. No suspicious cystic or solid mass is seen at the site of palpable concern. Incidental note of multiple simple cysts in the upper inner quadrant of the RIGHT breast.  Colonoscopy scheduled for next month  Depression screen [facility] 2/9 09/01/2020 05/09/2020 04/24/2020  Decreased Interest 0 0 0  Down, Depressed, Hopeless 0 0 0  PHQ - 2 Score 0 0 0  Altered sleeping 0 0 0  Tired, decreased energy 0 1 1  Change in appetite 0 0 0  Feeling bad or failure about yourself  0 0 0  Trouble concentrating 0 0 0  Moving slowly or fidgety/restless 0 0 0  Suicidal thoughts 0 0 0  PHQ-9 Score 0 1 1  Difficult doing work/chores Not difficult at all Not difficult at all Not difficult at all  Some recent data might be hidden    Fall Risk  09/01/2020 07/26/2020 05/09/2020 04/24/2020 04/19/2020  Falls in the past year? 0 0 0 0 0  Number falls in past yr: 0 0 0 0 0  Injury with Fall? 0 0 0 0 0  Follow up Falls evaluation completed - - - -     Allergies  Allergen Reactions  . Contrave [Naltrexone-Bupropion Hcl Er]     Palpitations, hypertension    Prior to Admission medications   Medication Sig Start Date End Date Taking? Authorizing Provider  ALPRAZolam (XANAX) 0.5 MG tablet alprazolam 0.5 mg tablet   Yes [provider]  busPIRone (BUSPAR) 5  MG tablet Take 1-2 tablets (5-10 mg total) by mouth 2 (two) times daily as needed (for anxiety/stress symptoms). 12/23/19  Yes Danelle Berry, PA-C  celecoxib (CELEBREX) 100 MG capsule Take by mouth.   Yes [provider]  ergocalciferol (VITAMIN D2) 1.25 MG (50000 UT) capsule ergocalciferol (vitamin D2) 1,250 mcg (50,000 unit) capsule   Yes [provider]  gabapentin (NEURONTIN) 300 MG capsule Take 300 mg by mouth daily as needed. 04/27/20  Yes [provider]  halcinonide (HALOG) 0.1 % external solution Halog 0.1 % topical solution   Yes [provider]  losartan (COZAAR) 100 MG tablet Take 1 tablet (100 mg total) by mouth daily. 08/11/20 11/09/20 Yes Agbor-Etang, Arlys John, MD  sertraline (ZOLOFT) 25 MG tablet sertraline 25 mg tablet   Yes [provider]  VITAMIN D PO Take by mouth once a week.    Yes [provider]    Past Medical History:  Diagnosis Date  . Abnormal thyroid stimulating hormone level   . Allergy   . Anemia    iron deficiency  . Encounter for initial prescription of injectable contraceptive   . Female stress incontinence   . Gallstones 12/05/2015  . High cholesterol   . History of ectopic pregnancy 11/29/2015  . Hypertension   . Menorrhagia with irregular cycle   .  Neutropenia (HCC) 07/23/2016  . Ovarian cyst 11/29/2015  . Ovarian cyst 11/29/2015  . 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 Proteinuria 08/14/2015  . Spider veins of both lower extremities   . 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 CHOLECYSTECTOMY N/A 01/19/2016   Procedure: LAPAROSCOPIC CHOLECYSTECTOMY WITH INTRAOPERATIVE CHOLANGIOGRAM;  Surgeon: Kieth Brightly, MD;  Location: [facility] ORS;  Service: General;   Laterality: N/A;  . DILITATION & CURRETTAGE/HYSTROSCOPY WITH NOVASURE ABLATION N/A 03/24/2017   Procedure: DILATATION & CURETTAGE/HYSTEROSCOPY WITH NOVASURE ABLATION;  Surgeon: Herold Harms, MD;  Location: [facility] ORS;  Service: Gynecology;  Laterality: N/A;  . laser surgery on eye Right 10/2016    Social History   Tobacco Use  . Smoking status: Never Smoker  . Smokeless tobacco: Never Used  Substance Use Topics  . Alcohol use: No    Alcohol/week: 0.0 standard drinks    Family History  Problem Relation Age of Onset  . Hypertension Mother   . Thyroid disease Mother   . Kidney disease Mother   . Hypertension Father   . Cancer Father        Prostate  . Diabetes Brother   . Diabetes Other   . Cancer Paternal Uncle        Prostate  . Diabetes Maternal Grandmother   . Cancer Paternal Grandfather        Prostate  . Diabetes Sister   . Schizophrenia Sister   . Breast cancer Neg Hx   . Ovarian cancer Neg Hx   . Heart disease Neg Hx     Review of Systems  Respiratory: Negative.   Cardiovascular: Negative.   Gastrointestinal: Negative.   Skin: Negative.   Neurological: Negative.   Endo/Heme/Allergies: Negative.      OBJECTIVE:  Today's Vitals   09/01/20 1401  BP: 110/68  Pulse: 67  Resp: 16  Temp: 98.3 F (36.8 C)  TempSrc: Oral  SpO2: 96%  Weight: 197 lb (89.4 kg)  Height: 5\' 8"  (1.727 m)   Body mass index is 29.95 kg/m.   Physical Exam Constitutional:      General: She is not in acute distress.    Appearance: Normal appearance. She is not ill-appearing.  HENT:     Head: Normocephalic.  Cardiovascular:     Rate and Rhythm: Normal rate and regular rhythm.     Pulses: Normal pulses.     Heart sounds: Normal heart sounds. No murmur heard. No friction rub. No gallop.   Pulmonary:     Effort: Pulmonary effort is normal. No respiratory distress.     Breath sounds: Normal breath sounds. No stridor. No wheezing, rhonchi or rales.  Chest:  Breasts:      Right: Tenderness present. No bleeding, inverted nipple, nipple discharge, skin change, axillary adenopathy or supraclavicular adenopathy.     Left: Mass and tenderness present. No bleeding, inverted nipple, nipple discharge, skin change, axillary adenopathy or supraclavicular adenopathy.     Abdominal:     General: Bowel sounds are normal.     Palpations: Abdomen is soft.     Tenderness: There is no abdominal tenderness.  Musculoskeletal:     Right lower leg: No edema.     Left lower leg: No edema.  Lymphadenopathy:     Upper Body:     Right upper body: No  supraclavicular, axillary or pectoral adenopathy.     Left upper body: No supraclavicular, axillary or pectoral adenopathy.  Skin:    General: Skin is warm and dry.  Neurological:     Mental Status: She is alert and oriented to person, place, and time.  Psychiatric:        Mood and Affect: Mood normal.        Behavior: Behavior normal.     No results found for this or any previous visit (from the past 24 hour(s)).  No results found.   ASSESSMENT and PLAN  Problem List Items Addressed This Visit      Other   Fibrocystic breast changes, bilateral - Primary   Relevant Orders   MM 3D SCREEN BREAST BILATERAL       Plan . Will follow up with mammogram results   Return in about 6 months (around 03/03/2021).    Macario Carls Demaree Liberto, FNP-BC [facility]

## 2020-09-05 ENCOUNTER — Telehealth: Payer: Self-pay | Admitting: Family Medicine

## 2020-09-05 NOTE — Telephone Encounter (Signed)
Patient is calling to ask the nurse or doctor to call regarding the incorrect order that was sent to the [facility] breast center.  Patient stated that they cannot accept the order because it should have been written differently.  Please advise and call patient asap so that she can get an appt asap.  CB# [phone]

## 2020-09-06 ENCOUNTER — Other Ambulatory Visit: Payer: Self-pay

## 2020-09-06 DIAGNOSIS — N6011 Diffuse cystic mastopathy of right breast: Secondary | ICD-10-CM

## 2020-09-06 DIAGNOSIS — N6012 Diffuse cystic mastopathy of left breast: Secondary | ICD-10-CM

## 2020-09-06 NOTE — Telephone Encounter (Signed)
Orders placed according to [facility] note

## 2020-09-08 ENCOUNTER — Ambulatory Visit
Admission: RE | Admit: 2020-09-08 | Discharge: 2020-09-08 | Disposition: A | Discharge status: Home / Self Care | Payer: BC Managed Care – PPO | Source: Ambulatory Visit | Attending: Family Medicine | Admitting: Family Medicine

## 2020-09-08 ENCOUNTER — Other Ambulatory Visit: Payer: Self-pay

## 2020-09-08 DIAGNOSIS — N6011 Diffuse cystic mastopathy of right breast: Secondary | ICD-10-CM

## 2020-09-08 DIAGNOSIS — N6012 Diffuse cystic mastopathy of left breast: Secondary | ICD-10-CM

## 2020-09-08 DIAGNOSIS — N6001 Solitary cyst of right breast: Secondary | ICD-10-CM | POA: Diagnosis not present

## 2020-09-08 DIAGNOSIS — N6002 Solitary cyst of left breast: Secondary | ICD-10-CM | POA: Diagnosis not present

## 2020-09-08 DIAGNOSIS — R922 Inconclusive mammogram: Secondary | ICD-10-CM | POA: Diagnosis not present

## 2020-09-18 DIAGNOSIS — Z01818 Encounter for other preprocedural examination: Secondary | ICD-10-CM | POA: Diagnosis not present

## 2020-09-20 ENCOUNTER — Ambulatory Visit: Payer: BC Managed Care – PPO | Admitting: Family Medicine

## 2020-09-21 DIAGNOSIS — D123 Benign neoplasm of transverse colon: Secondary | ICD-10-CM | POA: Diagnosis not present

## 2020-09-21 DIAGNOSIS — Z1211 Encounter for screening for malignant neoplasm of colon: Secondary | ICD-10-CM | POA: Diagnosis not present

## 2020-09-21 DIAGNOSIS — K640 First degree hemorrhoids: Secondary | ICD-10-CM | POA: Diagnosis not present

## 2020-09-25 LAB — HM COLONOSCOPY

## 2020-11-10 ENCOUNTER — Ambulatory Visit: Payer: BC Managed Care – PPO | Admitting: Cardiology

## 2020-11-13 ENCOUNTER — Ambulatory Visit: Payer: BC Managed Care – PPO | Admitting: Cardiology

## 2020-11-13 ENCOUNTER — Other Ambulatory Visit: Payer: Self-pay

## 2020-11-13 DIAGNOSIS — K529 Noninfective gastroenteritis and colitis, unspecified: Secondary | ICD-10-CM | POA: Diagnosis not present

## 2020-11-13 MED ORDER — LOSARTAN POTASSIUM 100 MG PO TABS: 100.0000 mg | ORAL_TABLET | Freq: Every day | ORAL | 0 refills | Status: DC

## 2020-12-04 ENCOUNTER — Encounter: Payer: Self-pay | Admitting: Family Medicine

## 2020-12-04 ENCOUNTER — Other Ambulatory Visit: Payer: Self-pay

## 2020-12-04 ENCOUNTER — Ambulatory Visit (INDEPENDENT_AMBULATORY_CARE_PROVIDER_SITE_OTHER): Payer: BC Managed Care – PPO | Admitting: Family Medicine

## 2020-12-04 VITALS — BP 118/80 | HR 68 | Temp 98.1°F | Resp 16 | Ht 68.0 in | Wt 198.1 lb

## 2020-12-04 DIAGNOSIS — Z0000 Encounter for general adult medical examination without abnormal findings: Secondary | ICD-10-CM | POA: Diagnosis not present

## 2020-12-04 DIAGNOSIS — N6011 Diffuse cystic mastopathy of right breast: Secondary | ICD-10-CM

## 2020-12-04 DIAGNOSIS — E538 Deficiency of other specified B group vitamins: Secondary | ICD-10-CM | POA: Diagnosis not present

## 2020-12-04 DIAGNOSIS — F419 Anxiety disorder, unspecified: Secondary | ICD-10-CM | POA: Diagnosis not present

## 2020-12-04 DIAGNOSIS — F3342 Major depressive disorder, recurrent, in full remission: Secondary | ICD-10-CM | POA: Diagnosis not present

## 2020-12-04 DIAGNOSIS — E782 Mixed hyperlipidemia: Secondary | ICD-10-CM | POA: Diagnosis not present

## 2020-12-04 DIAGNOSIS — E559 Vitamin D deficiency, unspecified: Secondary | ICD-10-CM | POA: Diagnosis not present

## 2020-12-04 DIAGNOSIS — N6012 Diffuse cystic mastopathy of left breast: Secondary | ICD-10-CM

## 2020-12-04 DIAGNOSIS — I10 Essential (primary) hypertension: Secondary | ICD-10-CM | POA: Diagnosis not present

## 2020-12-04 DIAGNOSIS — D709 Neutropenia, unspecified: Secondary | ICD-10-CM

## 2020-12-04 DIAGNOSIS — R7303 Prediabetes: Secondary | ICD-10-CM

## 2020-12-04 DIAGNOSIS — Z1231 Encounter for screening mammogram for malignant neoplasm of breast: Secondary | ICD-10-CM

## 2020-12-04 DIAGNOSIS — R7989 Other specified abnormal findings of blood chemistry: Secondary | ICD-10-CM | POA: Diagnosis not present

## 2020-12-04 NOTE — Patient Instructions (Signed)
Health Maintenance  Topic Date Due   Pneumococcal Vaccination (1 - PCV) Never done   COVID-19 Vaccine (5 - Booster) 10/09/2020   Flu Shot  12/04/2020   Pap Smear  12/08/2022   Tetanus Vaccine  12/06/2027   Colon Cancer Screening  09/26/2030   Hepatitis C Screening: USPSTF Recommendation to screen - Ages 18-48 yo.  Completed   HIV Screening  Completed   HPV Vaccine  Aged Out   Preventive Care 53-60 Years Old, Female Preventive care refers to lifestyle choices and visits with your health care provider that can promote health and wellness. This includes: A yearly physical exam. This is also called an annual wellness visit. Regular dental and eye exams. Immunizations. Screening for certain conditions. Healthy lifestyle choices, such as: Eating a healthy diet. Getting regular exercise. Not using drugs or products that contain nicotine and tobacco. Limiting alcohol use. What can I expect for my preventive care visit? Physical exam Your health care provider will check your: Height and weight. These may be used to calculate your BMI (body mass index). BMI is a measurement that tells if you are at a healthy weight. Heart rate and blood pressure. Body temperature. Skin for abnormal spots. Counseling Your health care provider may ask you questions about your: Past medical problems. Family's medical history. Alcohol, tobacco, and drug use. Emotional well-being. Home life and relationship well-being. Sexual activity. Diet, exercise, and sleep habits. Work and work Statistician. Access to firearms. Method of birth control. Menstrual cycle. Pregnancy history. What immunizations do I need?  Vaccines are usually given at various ages, according to a schedule. Your health care provider will recommend vaccines for you based on your age, medicalhistory, and lifestyle or other factors, such as travel or where you work. What tests do I need? Blood tests Lipid and cholesterol levels. These  may be checked every 5 years, or more often if you are over 75 years old. Hepatitis C test. Hepatitis B test. Screening Lung cancer screening. You may have this screening every year starting at age 36 if you have a 30-pack-year history of smoking and currently smoke or have quit within the past 15 years. Colorectal cancer screening. All adults should have this screening starting at age 47 and continuing until age 42. Your health care provider may recommend screening at age 66 if you are at increased risk. You will have tests every 1-10 years, depending on your results and the type of screening test. Diabetes screening. This is done by checking your blood sugar (glucose) after you have not eaten for a while (fasting). You may have this done every 1-3 years. Mammogram. This may be done every 1-2 years. Talk with your health care provider about when you should start having regular mammograms. This may depend on whether you have a family history of breast cancer. BRCA-related cancer screening. This may be done if you have a family history of breast, ovarian, tubal, or peritoneal cancers. Pelvic exam and Pap test. This may be done every 3 years starting at age 62. Starting at age 84, this may be done every 5 years if you have a Pap test in combination with an HPV test. Other tests STD (sexually transmitted disease) testing, if you are at risk. Bone density scan. This is done to screen for osteoporosis. You may have this scan if you are at high risk for osteoporosis. Talk with your health care provider about your test results, treatment options,and if necessary, the need for more tests. Follow these  instructions at home: Eating and drinking  Eat a diet that includes fresh fruits and vegetables, whole grains, lean protein, and low-fat dairy products. Take vitamin and mineral supplements as recommended by your health care provider. Do not drink alcohol if: Your health care provider tells you not  to drink. You are pregnant, may be pregnant, or are planning to become pregnant. If you drink alcohol: Limit how much you have to 0-1 drink a day. Be aware of how much alcohol is in your drink. In the [place] one drink equals one 12 oz bottle of beer (355 mL), one 5 oz glass of wine (148 mL), or one 1 oz glass of hard liquor (44 mL).  Lifestyle Take daily care of your teeth and gums. Brush your teeth every morning and night with fluoride toothpaste. Floss one time each day. Stay active. Exercise for at least 30 minutes 5 or more days each week. Do not use any products that contain nicotine or tobacco, such as cigarettes, e-cigarettes, and chewing tobacco. If you need help quitting, ask your health care provider. Do not use drugs. If you are sexually active, practice safe sex. Use a condom or other form of protection to prevent STIs (sexually transmitted infections). If you do not wish to become pregnant, use a form of birth control. If you plan to become pregnant, see your health care provider for a prepregnancy visit. If told by your health care provider, take low-dose aspirin daily starting at age 42. Find healthy ways to cope with stress, such as: Meditation, yoga, or listening to music. Journaling. Talking to a trusted person. Spending time with friends and family. Safety Always wear your seat belt while driving or riding in a vehicle. Do not drive: If you have been drinking alcohol. Do not ride with someone who has been drinking. When you are tired or distracted. While texting. Wear a helmet and other protective equipment during sports activities. If you have firearms in your house, make sure you follow all gun safety procedures. What's next? Visit your health care provider once a year for an annual wellness visit. Ask your health care provider how often you should have your eyes and teeth checked. Stay up to date on all vaccines. This information is not intended to replace advice  given to you by your health care provider. Make sure you discuss any questions you have with your healthcare provider. Document Revised: 01/25/2020 Document Reviewed: 01/01/2018 Elsevier Patient Education  2022 [employer].

## 2020-12-04 NOTE — Progress Notes (Signed)
Patient: Andrea Pope, Female    DOB: 1971-01-20, 50 y.o.   MRN: [id number] Delsa Grana, PA-C Visit Date: 12/04/2020  Today's Provider: Delsa Grana, PA-C   Chief Complaint  Patient presents with   Annual Exam   Subjective:   Annual physical exam:  Andrea Pope is a 50 y.o. female who presents today for complete physical exam:  Exercise/Activity:  working out more and working with a Clinical research associate   Diet/nutrition:  improved diet - reduced salt, reduced fatty fried foods, more fiber  Sleep: no concerns- sleeping good  Pt wished to do routine f/up on chronic conditions today in addition to CPE.  Recheck HTN and HLD Advised pt of separate visit billing/coding  Hyperlipidemia: Working on diet and lifestyle, 2 years ago lipid levels were significantly better and near normal with diet and lifestyle changes and efforts she is hoping to stay off medications and manage Last Lipids: Lab Results  Component Value Date   CHOL 249 (H) 12/23/2019   HDL 58 12/23/2019   LDLCALC 169 (H) 12/23/2019   TRIG 101 12/23/2019   CHOLHDL 4.3 12/23/2019   - Denies: Chest pain, shortness of breath, myalgias, claudication  Hypertension:  Currently managed on losartan 100 - multiple med changes over the past 6 months, HCTZ stopped and losartan reduced due to lightheaded episodes, she f/up with cardiologist and sx persisted and then BP was high, losartan dose was put back up to 100 mg daily. Pt reports good med compliance and denies any SE.   Blood pressure today is well controlled. BP Readings from Last 3 Encounters:  09/01/20 110/68  08/11/20 (!) 142/91  07/26/20 110/60   Pt denies CP, SOB, exertional sx, LE edema, palpitation, Ha's, visual disturbances, lightheadedness, hypotension, syncope. Dietary efforts for BP?  Healthy as per above  Anxiety, not currently using Xanax, BuSpar is effective she uses it occasionally, she is particularly anxious when she has to drive  Vitamin D deficiency  and vitamin B12 deficiency in the past, managed for arthralgias and myalgias with Celebrex and gabapentin saw a specialist and had a positive ANA  USPSTF grade A and B recommendations - reviewed and addressed today  Depression:  Phq 9 completed today by patient, was reviewed by me with patient in the room PHQ score is neg, pt feels good PHQ 2/9 Scores 12/04/2020 09/01/2020 05/09/2020 04/24/2020  PHQ - 2 Score 0 0 0 0  PHQ- 9 Score 0 0 1 1   Depression screen [facility] 2/9 12/04/2020 09/01/2020 05/09/2020 04/24/2020 12/31/2019  Decreased Interest 0 0 0 0 0  Down, Depressed, Hopeless 0 0 0 0 0  PHQ - 2 Score 0 0 0 0 0  Altered sleeping 0 0 0 0 0  Tired, decreased energy 0 0 1 1 0  Change in appetite 0 0 0 0 0  Feeling bad or failure about yourself  0 0 0 0 0  Trouble concentrating 0 0 0 0 0  Moving slowly or fidgety/restless 0 0 0 0 0  Suicidal thoughts 0 0 0 0 0  PHQ-9 Score 0 0 1 1 0  Difficult doing work/chores Not difficult at all Not difficult at all Not difficult at all Not difficult at all -  Some recent data might be hidden  GAD - coping, driving a trigger - buspar prn  Alcohol screening: [place] Office Visit from 09/01/2020 in [facility]  AUDIT-C Score 0      Immunizations and Health Maintenance: Health  Maintenance  Topic Date Due   Pneumococcal Vaccine 25-3 Years old (1 - PCV) Never done   COVID-19 Vaccine (5 - Booster) 10/09/2020   INFLUENZA VACCINE  12/04/2020   PAP SMEAR-Modifier  12/08/2022   TETANUS/TDAP  12/06/2027   COLONOSCOPY (Pts 45-64yr Insurance coverage will need to be confirmed)  09/26/2030   Hepatitis C Screening  Completed   HIV Screening  Completed   HPV VACCINES  Aged Out    Hep C Screening: done int he past   STD testing and prevention (HIV/chl/gon/syphilis):  see above, no additional testing desired by pt today  Intimate partner violence:  denies  Sexual History/Pain during Intercourse: Married  Menstrual History/LMP/Abnormal  Bleeding:  none for 5 years No LMP recorded. Patient has had an ablation.  Incontinence Symptoms: none   Breast cancer:  Last Mammogram: *see HM list above- done May - UKoreadone bilaterally, neg due for annual screening next may BRCA gene screening: none in fam  Cervical cancer screening: UTD  Pt  family hx of cancers - breast, ovarian, uterine, colon:     Osteoporosis:   Discussion on osteoporosis per age, including high calcium and vitamin D supplementation, weight bearing exercises Pt is supplementing with daily Vit D.  Skin cancer:  Hx of skin CA -  NO Discussed atypical lesions   Colorectal cancer:   Colonoscopy is done May 2022 - due in 10 years  Discussed concerning signs and sx of CRC, pt denies melena,   Lung cancer:   Low Dose CT Chest recommended if Age 48-80years, 30 pack-year currently smoking OR have quit w/in 15years. Patient does not qualify.    Social History   Tobacco Use   Smoking status: Never   Smokeless tobacco: Never  Vaping Use   Vaping Use: Never used  Substance Use Topics   Alcohol use: Yes    Alcohol/week: 2.0 standard drinks    Types: 2 Glasses of wine per week    Comment: twice a week   Drug use: No     Flowsheet Row [facility] AUDIT-C Score 0       Family History  Problem Relation Age of Onset   Hypertension Mother    Thyroid disease Mother    Kidney disease Mother    Hypertension Father    Cancer Father        Prostate   Diabetes Brother    Diabetes Other    Cancer Paternal Uncle        Prostate   Diabetes Maternal Grandmother    Cancer Paternal Grandfather        Prostate   Diabetes Sister    Schizophrenia Sister    Breast cancer Neg Hx    Ovarian cancer Neg Hx    Heart disease Neg Hx      Blood pressure/Hypertension: BP Readings from Last 3 Encounters:  12/04/20 118/80  09/01/20 110/68  08/11/20 (!) 142/91    Weight/Obesity: Wt Readings from Last 3  Encounters:  12/04/20 198 lb 1.6 oz (89.9 kg)  09/01/20 197 lb (89.4 kg)  08/11/20 198 lb (89.8 kg)   BMI Readings from Last 3 Encounters:  12/04/20 30.12 kg/m  09/01/20 29.95 kg/m  08/11/20 30.11 kg/m    Lipids:  Lab Results  Component Value Date   CHOL 249 (H) 12/23/2019   CHOL 188 12/30/2018   CHOL 230 (H) 04/28/2018   Lab Results  Component Value Date   HDL  58 12/23/2019   HDL 61 12/30/2018   HDL 59 04/28/2018   Lab Results  Component Value Date   LDLCALC 169 (H) 12/23/2019   LDLCALC 103 (H) 12/30/2018   LDLCALC 153 (H) 04/28/2018   Lab Results  Component Value Date   TRIG 101 12/23/2019   TRIG 144 12/30/2018   TRIG 78 04/28/2018   Lab Results  Component Value Date   CHOLHDL 4.3 12/23/2019   CHOLHDL 3.1 12/30/2018   CHOLHDL 3.9 04/28/2018   No results found for: LDLDIRECT Based on the results of lipid panel his/her cardiovascular risk factor ( using [facility] )  in the next 10 years is: The 10-year ASCVD risk score Mikey Bussing DC Brooke Bonito., et al., 2013) is: 2.3%   Values used to calculate the score:     Age: 41 years     Sex: Female     Is Non-Hispanic African American: Yes     Diabetic: No     Tobacco smoker: No     Systolic Blood Pressure: 161 mmHg     Is BP treated: Yes     HDL Cholesterol: 58 mg/dL     Total Cholesterol: 249 mg/dL Glucose:  Glucose, Bld  Date Value Ref Range Status  07/26/2020 81 65 - 99 mg/dL Final    Comment:    .            Fasting reference interval .   12/23/2019 85 65 - 139 mg/dL Final    Comment:    .        Non-fasting reference interval .   11/10/2019 89 70 - 99 mg/dL Final    Comment:    Glucose reference range applies only to samples taken after fasting for at least 8 hours.   Hypertension: BP Readings from Last 3 Encounters:  12/04/20 118/80  09/01/20 110/68  08/11/20 (!) 142/91   Obesity: Wt Readings from Last 3 Encounters:  12/04/20 198 lb 1.6 oz (89.9 kg)  09/01/20 197 lb (89.4 kg)  08/11/20 198 lb  (89.8 kg)   BMI Readings from Last 3 Encounters:  12/04/20 30.12 kg/m  09/01/20 29.95 kg/m  08/11/20 30.11 kg/m    Advanced Care Planning:  A voluntary discussion about advance care planning including the explanation and discussion of advance directives.   Discussed health care proxy and Living will, and the patient was able to identify a health care proxy as spouse.   Patient does not know have a living will at present time.   Social History      She        Social History   Socioeconomic History   Marital status: Married    Spouse name: Ron    Number of children: 4   Years of education: Not on file   Highest education level: Bachelor's degree (e.g., BA, AB, BS)  Occupational History   Occupation: Teacher  Tobacco Use   Smoking status: Never   Smokeless tobacco: Never  Vaping Use   Vaping Use: Never used  Substance and Sexual Activity   Alcohol use: Yes    Alcohol/week: 2.0 standard drinks    Types: 2 Glasses of wine per week    Comment: twice a week   Drug use: No   Sexual activity: Yes    Partners: Male    Birth control/protection: None  Other Topics Concern   Not on file  Social History Narrative   Not on file   Social Determinants of Health   Financial  Resource Strain: Low Risk    Difficulty of Paying Living Expenses: Not hard at all  Food Insecurity: No Food Insecurity   Worried About Charity fundraiser in the Last Year: Never true   Ran Out of Food in the Last Year: Never true  Transportation Needs: No Transportation Needs   Lack of Transportation (Medical): No   Lack of Transportation (Non-Medical): No  Physical Activity: Insufficiently Active   Days of Exercise per Week: 3 days   Minutes of Exercise per Session: 40 min  Stress: No Stress Concern Present   Feeling of Stress : Not at all  Social Connections: Moderately Isolated   Frequency of Communication with Friends and Family: Twice a week   Frequency of Social Gatherings with Friends and  Family: Three times a week   Attends Religious Services: Never   Active Member of Clubs or Organizations: No   Attends Music therapist: Never   Marital Status: Married    Family History        Family History  Problem Relation Age of Onset   Hypertension Mother    Thyroid disease Mother    Kidney disease Mother    Hypertension Father    Cancer Father        Prostate   Diabetes Brother    Diabetes Other    Cancer Paternal Uncle        Prostate   Diabetes Maternal Grandmother    Cancer Paternal Grandfather        Prostate   Diabetes Sister    Schizophrenia Sister    Breast cancer Neg Hx    Ovarian cancer Neg Hx    Heart disease Neg Hx     Patient Active Problem List   Diagnosis Date Noted   Prediabetes 12/04/2020   Mixed hyperlipidemia 12/04/2020   Hypertension, benign 12/03/2019   Fibrocystic breast changes, bilateral 12/03/2019   Mild episode of recurrent major depressive disorder ([place]) 03/26/2019   Obstructive sleep apnea 12/30/2018   Absolute anemia 01/09/2018   Vitamin B12 deficiency 01/08/2018   Vitamin D deficiency 01/08/2018   Positive ANA (antinuclear antibody) 12/15/2017   Primary osteoarthritis of right knee 05/13/2017   Overweight (BMI 25.0-29.9) 11/19/2016   White coat syndrome with hypertension 01/25/2016   Dysmenorrhea 01/03/2016   Allergic rhinitis, seasonal 01/10/2015   Asymptomatic varicose veins of both lower extremities 01/10/2015    Past Surgical History:  Procedure Laterality Date   CHOLECYSTECTOMY N/A 01/19/2016   Procedure: LAPAROSCOPIC CHOLECYSTECTOMY WITH INTRAOPERATIVE CHOLANGIOGRAM;  Surgeon: Christene Lye, MD;  Location: [facility] ORS;  Service: General;  Laterality: N/A;   DILITATION & CURRETTAGE/HYSTROSCOPY WITH NOVASURE ABLATION N/A 03/24/2017   Procedure: DILATATION & CURETTAGE/HYSTEROSCOPY WITH NOVASURE ABLATION;  Surgeon: Brayton Mars, MD;  Location: [facility] ORS;  Service: Gynecology;  Laterality: N/A;    laser surgery on eye Right 10/2016     Current Outpatient Medications:    busPIRone (BUSPAR) 5 MG tablet, Take 1-2 tablets (5-10 mg total) by mouth 2 (two) times daily as needed (for anxiety/stress symptoms)., Disp: 120 tablet, Rfl: 1   celecoxib (CELEBREX) 100 MG capsule, Take by mouth., Disp: , Rfl:    gabapentin (NEURONTIN) 300 MG capsule, Take 300 mg by mouth daily as needed., Disp: , Rfl:    halcinonide (HALOG) 0.1 % external solution, Halog 0.1 % topical solution, Disp: , Rfl:    losartan (COZAAR) 100 MG tablet, Take 1 tablet (100 mg total) by mouth daily., Disp: 30 tablet, Rfl: 0  Allergies  Allergen Reactions   Contrave [Naltrexone-Bupropion Hcl Er]     Palpitations, hypertension    Patient Care Team: Delsa Grana, PA-C as PCP - General (Family Medicine) Kate Sable, MD as PCP - Cardiology (Cardiology) Lada, Satira Anis, MD as Attending Physician (Family Medicine) Christene Lye, MD (General Surgery)  Review of Systems  Constitutional: Negative.  Negative for activity change, appetite change, chills, diaphoresis, fatigue, fever and unexpected weight change.  HENT: Negative.    Eyes: Negative.   Respiratory: Negative.  Negative for choking, chest tightness and shortness of breath.   Cardiovascular:  Positive for palpitations. Negative for chest pain and leg swelling.  Gastrointestinal: Negative.  Negative for abdominal pain and blood in stool.  Endocrine: Negative.   Genitourinary: Negative.   Musculoskeletal: Negative.  Negative for arthralgias, gait problem, joint swelling and myalgias.  Skin: Negative.  Negative for pallor and rash.  Allergic/Immunologic: Negative.   Neurological: Negative.  Negative for syncope and weakness.  Hematological: Negative.   Psychiatric/Behavioral:  Negative for dysphoric mood, self-injury and suicidal ideas. The patient is nervous/anxious.   All other systems reviewed and are negative.   I personally reviewed active problem  list, medication list, allergies, family history, social history, health maintenance, notes from last encounter, lab results, imaging with the patient/caregiver today.        Objective:   Vitals:  Vitals:   12/04/20 0953  BP: 118/80  Pulse: 68  Resp: 16  Temp: 98.1 F (36.7 C)  SpO2: 99%  Weight: 198 lb 1.6 oz (89.9 kg)  Height: 5' 8"  (1.727 m)    Body mass index is 30.12 kg/m.  Physical Exam Vitals and nursing note reviewed.  Constitutional:      General: She is not in acute distress.    Appearance: Normal appearance. She is well-developed, well-groomed and overweight. She is not ill-appearing, toxic-appearing or diaphoretic.     Interventions: Face mask in place.  HENT:     Head: Normocephalic and atraumatic.     Right Ear: Tympanic membrane, ear canal and external ear normal. There is no impacted cerumen.     Left Ear: Tympanic membrane, ear canal and external ear normal. There is no impacted cerumen.     Nose: Mucosal edema present. No congestion or rhinorrhea.     Right Turbinates: Enlarged and swollen.     Left Turbinates: Enlarged and swollen.     Mouth/Throat:     Lips: Pink.     Mouth: Mucous membranes are moist.     Pharynx: Oropharynx is clear. Uvula midline. No pharyngeal swelling, oropharyngeal exudate, posterior oropharyngeal erythema or uvula swelling.  Eyes:     General: Lids are normal. No scleral icterus.       Right eye: No discharge.        Left eye: No discharge.     Conjunctiva/sclera: Conjunctivae normal.     Pupils: Pupils are equal, round, and reactive to light.  Neck:     Thyroid: No thyroid mass, thyromegaly or thyroid tenderness.     Trachea: Phonation normal. No tracheal deviation.  Cardiovascular:     Rate and Rhythm: Normal rate and regular rhythm.     Pulses: Normal pulses.          Radial pulses are 2+ on the right side and 2+ on the left side.       Posterior tibial pulses are 2+ on the right side and 2+ on the left side.      Heart  sounds: Normal heart sounds. No murmur heard.   No friction rub. No gallop.  Pulmonary:     Effort: Pulmonary effort is normal. No respiratory distress.     Breath sounds: Normal breath sounds. No stridor. No wheezing, rhonchi or rales.  Chest:     Chest wall: No tenderness.  Abdominal:     General: Bowel sounds are normal. There is no distension.     Palpations: Abdomen is soft.     Tenderness: There is no abdominal tenderness. There is no guarding or rebound.  Musculoskeletal:        General: No deformity.     Cervical back: Normal range of motion and neck supple. No rigidity.     Right lower leg: No edema.     Left lower leg: No edema.  Lymphadenopathy:     Cervical: No cervical adenopathy.  Skin:    General: Skin is warm and dry.     Coloration: Skin is not jaundiced or pale.     Findings: No rash.  Neurological:     Mental Status: She is alert. Mental status is at baseline.     Motor: No abnormal muscle tone.     Gait: Gait normal.  Psychiatric:        Mood and Affect: Mood normal.        Speech: Speech normal.        Behavior: Behavior normal. Behavior is cooperative.      Fall Risk: Fall Risk  12/04/2020 09/01/2020 07/26/2020 05/09/2020 04/24/2020  Falls in the past year? 0 0 0 0 0  Number falls in past yr: 0 0 0 0 0  Injury with Fall? 0 0 0 0 0  Follow up - Falls evaluation completed - - -    Functional Status Survey: Is the patient deaf or have difficulty hearing?: No Does the patient have difficulty seeing, even when wearing glasses/contacts?: No Does the patient have difficulty concentrating, remembering, or making decisions?: No Does the patient have difficulty walking or climbing stairs?: No Does the patient have difficulty dressing or bathing?: No Does the patient have difficulty doing errands alone such as visiting a doctor's office or shopping?: Yes   Assessment & Plan:    CPE completed today  USPSTF grade A and B recommendations reviewed with  patient; age-appropriate recommendations, preventive care, screening tests, etc discussed and encouraged; healthy living encouraged; see AVS for patient education given to patient  Discussed importance of 150 minutes of physical activity weekly, AHA exercise recommendations given to pt in AVS/handout  Discussed importance of healthy diet:  eating lean meats and proteins, avoiding trans fats and saturated fats, avoid simple sugars and excessive carbs in diet, eat 6 servings of fruit/vegetables daily and drink plenty of water and avoid sweet beverages.    Recommended pt to do annual eye exam and routine dental exams/cleanings  Depression, alcohol, fall screening completed as documented above and per flowsheets  Reviewed Health Maintenance: Health Maintenance  Topic Date Due   Pneumococcal Vaccine 18-23 Years old (1 - PCV) Never done   COVID-19 Vaccine (5 - Booster) 10/09/2020   INFLUENZA VACCINE  12/04/2020   PAP SMEAR-Modifier  12/08/2022   TETANUS/TDAP  12/06/2027   COLONOSCOPY (Pts 45-84yr Insurance coverage will need to be confirmed)  09/26/2030   Hepatitis C Screening  Completed   HIV Screening  Completed   HPV VACCINES  Aged Out    Immunizations: Immunization History  Administered Date(s) Administered   Influenza,inj,Quad PF,6+ Mos 01/25/2016,  12/26/2017, 02/24/2019, 03/14/2020   Influenza-Unspecified 03/07/2017   PFIZER(Purple Top)SARS-COV-2 Vaccination 12/13/2019, 01/03/2020, 06/11/2020   PPD Test 11/20/2015   Tdap 12/05/2017     ICD-10-CM   1. Annual physical exam  Z00.00 Lipid panel    CBC with Differential/Platelet    COMPLETE METABOLIC PANEL WITH GFR    Hemoglobin A1c    2. Fibrocystic breast changes, bilateral  N60.11    N60.12    Reviewed her recent work-up and future mammogram screening    3. Recurrent major depressive disorder, in full remission ([place])  F33.42    Mood is good no current depressive symptoms    4. Vitamin D deficiency  Y86.5 COMPLETE METABOLIC  PANEL WITH GFR    VITAMIN D 25 Hydroxy (Vit-D Deficiency, Fractures)    5. Vitamin B12 deficiency  E53.8 CBC with Differential/Platelet    Vitamin B12    6. Hypertension, benign  H84 COMPLETE METABOLIC PANEL WITH GFR   Stable, well-controlled on losartan 100 mg daily    7. Prediabetes  O96.29 COMPLETE METABOLIC PANEL WITH GFR    Hemoglobin A1c   A1C 5.8 12/2019, recheck    8. Mixed hyperlipidemia  E78.2 Lipid panel    COMPLETE METABOLIC PANEL WITH GFR   She has been working and diet and lifestyle changes would like to recheck lipids discussed indication for statin and benefits of statin    9. Abnormal TSH  R79.89 TSH    T4, free   History of low TSH with normal free T4    10. Anxiety disorder, unspecified type  F41.9    Anxiety with multiple somatic symptoms particularly palpitations and chest pain thorough cardiac work-up was reassuring discussed meds and therapy    11. Encounter for screening mammogram for malignant neoplasm of breast  Z12.31 MM 3D SCREEN BREAST BILATERAL   Due May 2023           Delsa Grana, Hershal Coria 12/04/20 10:15 AM  [facility]

## 2020-12-04 NOTE — Progress Notes (Deleted)
Name: Andrea Pope   MRN: [id number]    DOB: [birth year]   Date:12/04/2020       Progress Note  No chief complaint on file.    Subjective:   Andrea Pope is a 50 y.o. female, presents to clinic for routine f/up      Current Outpatient Medications:    ALPRAZolam (XANAX) 0.5 MG tablet, alprazolam 0.5 mg tablet, Disp: , Rfl:    busPIRone (BUSPAR) 5 MG tablet, Take 1-2 tablets (5-10 mg total) by mouth 2 (two) times daily as needed (for anxiety/stress symptoms)., Disp: 120 tablet, Rfl: 1   celecoxib (CELEBREX) 100 MG capsule, Take by mouth., Disp: , Rfl:    ergocalciferol (VITAMIN D2) 1.25 MG (50000 UT) capsule, ergocalciferol (vitamin D2) 1,250 mcg (50,000 unit) capsule, Disp: , Rfl:    gabapentin (NEURONTIN) 300 MG capsule, Take 300 mg by mouth daily as needed., Disp: , Rfl:    halcinonide (HALOG) 0.1 % external solution, Halog 0.1 % topical solution, Disp: , Rfl:    losartan (COZAAR) 100 MG tablet, Take 1 tablet (100 mg total) by mouth daily., Disp: 30 tablet, Rfl: 0   sertraline (ZOLOFT) 25 MG tablet, sertraline 25 mg tablet, Disp: , Rfl:    VITAMIN D PO, Take by mouth once a week. , Disp: , Rfl:   Patient Active Problem List   Diagnosis Date Noted   Dizziness 07/26/2020   Cough 12/31/2019   Chest congestion 12/31/2019   Hypertension, benign 12/03/2019   Fibrocystic breast changes, bilateral 12/03/2019   Mild episode of recurrent major depressive disorder (HCC) 03/26/2019   Obstructive sleep apnea 12/30/2018   Absolute anemia 01/09/2018   Vitamin B12 deficiency 01/08/2018   Vitamin D deficiency 01/08/2018   Positive ANA (antinuclear antibody) 12/15/2017   Primary osteoarthritis of right knee 05/13/2017   Overweight (BMI 25.0-29.9) 11/19/2016   White coat hypertension 01/25/2016   Dysmenorrhea 01/03/2016   High cholesterol 01/01/2016   History of ectopic pregnancy 11/29/2015   Abnormal thyroid stimulating hormone (TSH) level 01/10/2015   Allergic rhinitis,  seasonal 01/10/2015   Asymptomatic varicose veins of both lower extremities 01/10/2015    Past Surgical History:  Procedure Laterality Date   CHOLECYSTECTOMY N/A 01/19/2016   Procedure: LAPAROSCOPIC CHOLECYSTECTOMY WITH INTRAOPERATIVE CHOLANGIOGRAM;  Surgeon: Kieth Brightly, MD;  Location: [facility];  Service: General;  Laterality: N/A;   DILITATION & CURRETTAGE/HYSTROSCOPY WITH NOVASURE ABLATION N/A 03/24/2017   Procedure: DILATATION & CURETTAGE/HYSTEROSCOPY WITH NOVASURE ABLATION;  Surgeon: Herold Harms, MD;  Location: [facility];  Service: Gynecology;  Laterality: N/A;   laser surgery on eye Right 10/2016    Family History  Problem Relation Age of Onset   Hypertension Mother    Thyroid disease Mother    Kidney disease Mother    Hypertension Father    Cancer Father        Prostate   Diabetes Brother    Diabetes Other    Cancer Paternal Uncle        Prostate   Diabetes Maternal Grandmother    Cancer Paternal Grandfather        Prostate   Diabetes Sister    Schizophrenia Sister    Breast cancer Neg Hx    Ovarian cancer Neg Hx    Heart disease Neg Hx     Social History   Tobacco Use   Smoking status: Never   Smokeless tobacco: Never  Vaping Use   Vaping Use: Never used  Substance Use Topics  Alcohol use: No    Alcohol/week: 0.0 standard drinks   Drug use: No     Allergies  Allergen Reactions   Contrave [Naltrexone-Bupropion Hcl Er]     Palpitations, hypertension    Health Maintenance  Topic Date Due   Pneumococcal Vaccine 10-47 Years old (1 - PCV) Never done   COVID-19 Vaccine (5 - Booster) 10/09/2020   INFLUENZA VACCINE  12/04/2020   PAP SMEAR-Modifier  12/08/2022   TETANUS/TDAP  12/06/2027   COLONOSCOPY (Pts 45-19yrs Insurance coverage will need to be confirmed)  09/26/2030   Hepatitis C Screening  Completed   HIV Screening  Completed   HPV VACCINES  Aged Out    Chart Review Today: I personally reviewed active problem list, medication  list, allergies, family history, social history, health maintenance, notes from last encounter, lab results, imaging with the patient/caregiver today.   Review of Systems   Objective:   There were no vitals filed for this visit.  There is no height or weight on file to calculate BMI.  Physical Exam      Assessment & Plan:   ***  No follow-ups on file.   Danelle Berry, PA-C 12/04/20 9:29 AM

## 2020-12-05 ENCOUNTER — Other Ambulatory Visit: Payer: Self-pay | Admitting: Family Medicine

## 2020-12-05 LAB — COMPLETE METABOLIC PANEL WITH GFR
AG Ratio: 1.8 (calc) (ref 1.0–2.5)
ALT: 9 U/L (ref 6–29)
AST: 12 U/L (ref 10–35)
Albumin: 4.2 g/dL (ref 3.6–5.1)
Alkaline phosphatase (APISO): 49 U/L (ref 31–125)
BUN: 16 mg/dL (ref 7–25)
CO2: 27 mmol/L (ref 20–32)
Calcium: 8.9 mg/dL (ref 8.6–10.2)
Chloride: 105 mmol/L (ref 98–110)
Creat: 0.88 mg/dL (ref 0.50–0.99)
Globulin: 2.4 g/dL (calc) (ref 1.9–3.7)
Glucose, Bld: 83 mg/dL (ref 65–99)
Potassium: 4.3 mmol/L (ref 3.5–5.3)
Sodium: 139 mmol/L (ref 135–146)
Total Bilirubin: 0.6 mg/dL (ref 0.2–1.2)
Total Protein: 6.6 g/dL (ref 6.1–8.1)
eGFR: 81 mL/min/{1.73_m2} (ref >=60)

## 2020-12-05 LAB — CBC WITH DIFFERENTIAL/PLATELET
Absolute Monocytes: 349 cells/uL (ref 200–950)
Basophils Absolute: 19 cells/uL (ref 0–200)
Basophils Relative: 0.6 %
Eosinophils Absolute: 160 cells/uL (ref 15–500)
Eosinophils Relative: 5 %
HCT: 39.8 % (ref 35.0–45.0)
Hemoglobin: 13.1 g/dL (ref 11.7–15.5)
Lymphs Abs: 1843 cells/uL (ref 850–3900)
MCH: 30.3 pg (ref 27.0–33.0)
MCHC: 32.9 g/dL (ref 32.0–36.0)
MCV: 91.9 fL (ref 80.0–100.0)
MPV: 10.9 fL (ref 7.5–12.5)
Monocytes Relative: 10.9 %
Neutro Abs: 829 cells/uL — ABNORMAL LOW (ref 1500–7800)
Neutrophils Relative %: 25.9 %
Platelets: 339 10*3/uL (ref 140–400)
RBC: 4.33 10*6/uL (ref 3.80–5.10)
RDW: 12.4 % (ref 11.0–15.0)
Total Lymphocyte: 57.6 %
WBC: 3.2 10*3/uL — ABNORMAL LOW (ref 3.8–10.8)

## 2020-12-05 LAB — LIPID PANEL
Cholesterol: 216 mg/dL — ABNORMAL HIGH (ref <200)
HDL: 58 mg/dL (ref >=50)
LDL Cholesterol (Calc): 138 mg/dL (calc) — ABNORMAL HIGH
Non-HDL Cholesterol (Calc): 158 mg/dL (calc) — ABNORMAL HIGH (ref <130)
Total CHOL/HDL Ratio: 3.7 (calc) (ref <5.0)
Triglycerides: 94 mg/dL (ref <150)

## 2020-12-05 LAB — VITAMIN D 25 HYDROXY (VIT D DEFICIENCY, FRACTURES): Vit D, 25-Hydroxy: 25 ng/mL — ABNORMAL LOW (ref 30–100)

## 2020-12-05 LAB — T4, FREE: Free T4: 1 ng/dL (ref 0.8–1.8)

## 2020-12-05 LAB — HEMOGLOBIN A1C
Hgb A1c MFr Bld: 5.5 % of total Hgb (ref <5.7)
Mean Plasma Glucose: 111 mg/dL
eAG (mmol/L): 6.2 mmol/L

## 2020-12-05 LAB — VITAMIN B12: Vitamin B-12: 340 pg/mL (ref 200–1100)

## 2020-12-05 LAB — TSH: TSH: 0.41 mIU/L

## 2020-12-05 MED ORDER — CYANOCOBALAMIN 500 MCG PO TABS: 500.0000 ug | ORAL_TABLET | Freq: Every day | ORAL | 3 refills | Status: DC

## 2020-12-06 NOTE — Addendum Note (Signed)
Addended by: Davene Costain on: 12/06/2020 11:27 AM   Modules accepted: Orders

## 2020-12-07 ENCOUNTER — Encounter: Payer: Self-pay | Admitting: Family Medicine

## 2020-12-13 ENCOUNTER — Encounter: Payer: Self-pay | Admitting: Obstetrics and Gynecology

## 2020-12-13 ENCOUNTER — Encounter: Payer: BC Managed Care – PPO | Admitting: Obstetrics and Gynecology

## 2020-12-22 ENCOUNTER — Ambulatory Visit: Payer: BC Managed Care – PPO | Admitting: Cardiology

## 2020-12-22 ENCOUNTER — Other Ambulatory Visit: Payer: Self-pay

## 2020-12-22 ENCOUNTER — Encounter: Payer: Self-pay | Admitting: Cardiology

## 2020-12-22 VITALS — BP 142/84 | HR 74 | Ht 68.0 in | Wt 199.0 lb

## 2020-12-22 DIAGNOSIS — I10 Essential (primary) hypertension: Secondary | ICD-10-CM

## 2020-12-22 DIAGNOSIS — E7800 Pure hypercholesterolemia, unspecified: Secondary | ICD-10-CM | POA: Diagnosis not present

## 2020-12-22 NOTE — Progress Notes (Signed)
Cardiology Office Note:    Date:  12/22/2020   ID:  Andrea Pope, DOB 1970/11/12, MRN [id number]  PCP:  Danelle Berry, PA-C  Cardiologist:  Debbe Odea, MD  Electrophysiologist:  None   Referring MD: Danelle Berry, PA-C   Chief Complaint  Patient presents with   Other    3 month follow up. Meds reviewed verbally with patient.     History of Present Illness:    Andrea Pope is a 50 y.o. female with history of hypertension, anxiety who presents for follow-up.  Last seen due to elevated blood pressures.  Losartan was increased to 100 mg daily.  She feels well, checks her blood pressure frequently which is well controlled.  Has no adverse effects with losartan.  Has a history of varicose veins, wears compression stockings for these.  Has started to exercise more often.   Prior notes Echocardiogram 12/2019 shows normal systolic and diastolic function, EF 60 to 65%. Coronary CTA 01/2020, calcium score 0, no evidence of CAD.   Past Medical History:  Diagnosis Date   Abnormal thyroid stimulating hormone (TSH) level 01/10/2015   Abnormal thyroid stimulating hormone level    Allergy    Anemia    iron deficiency   Encounter for initial prescription of injectable contraceptive    Female stress incontinence    Gallstones 12/05/2015   High cholesterol    History of ectopic pregnancy 11/29/2015   Hypertension    Menorrhagia with irregular cycle    Neutropenia (HCC) 07/23/2016   Ovarian cyst 11/29/2015   Ovarian cyst 11/29/2015   Patellofemoral stress syndrome 05/13/2017   Proteinuria 08/14/2015   Spider veins of both lower extremities    Status post D&C/hysteroscopy/endometrial ablation 03/24/2017   Good TVH candidate 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 Pathology: Proliferative endometrium with breakdown changes; negative for atypia/hyperplasia/carcinoma    Past Surgical History:  Procedure Laterality Date   CHOLECYSTECTOMY N/A 01/19/2016   Procedure: LAPAROSCOPIC  CHOLECYSTECTOMY WITH INTRAOPERATIVE CHOLANGIOGRAM;  Surgeon: Kieth Brightly, MD;  Location: [facility];  Service: General;  Laterality: N/A;   DILITATION & CURRETTAGE/HYSTROSCOPY WITH NOVASURE ABLATION N/A 03/24/2017   Procedure: DILATATION & CURETTAGE/HYSTEROSCOPY WITH NOVASURE ABLATION;  Surgeon: Herold Harms, MD;  Location: [facility];  Service: Gynecology;  Laterality: N/A;   laser surgery on eye Right 10/2016    Current Medications: Current Meds  Medication Sig   busPIRone (BUSPAR) 5 MG tablet Take 1-2 tablets (5-10 mg total) by mouth 2 (two) times daily as needed (for anxiety/stress symptoms).   losartan (COZAAR) 100 MG tablet Take 1 tablet (100 mg total) by mouth daily.   vitamin B-12 (CYANOCOBALAMIN) 500 MCG tablet Take 1 tablet (500 mcg total) by mouth daily.     Allergies:   Contrave [naltrexone-bupropion hcl er]   Social History   Socioeconomic History   Marital status: Married    Spouse name: Ron    Number of children: 4   Years of education: Not on file   Highest education level: Bachelor's degree (e.g., BA, AB, BS)  Occupational History   Occupation: Teacher  Tobacco Use   Smoking status: Never   Smokeless tobacco: Never  Vaping Use   Vaping Use: Never used  Substance and Sexual Activity   Alcohol use: Yes    Alcohol/week: 2.0 standard drinks    Types: 2 Glasses of wine per week    Comment: twice a week   Drug use: No   Sexual activity: Yes    Partners:  Male    Birth control/protection: None  Other Topics Concern   Not on file  Social History Narrative   Not on file   Social Determinants of Health   Financial Resource Strain: Low Risk    Difficulty of Paying Living Expenses: Not hard at all  Food Insecurity: No Food Insecurity   Worried About Programme researcher, broadcasting/film/video in the Last Year: Never true   Ran Out of Food in the Last Year: Never true  Transportation Needs: No Transportation Needs   Lack of Transportation (Medical): No   Lack of  Transportation (Non-Medical): No  Physical Activity: Insufficiently Active   Days of Exercise per Week: 3 days   Minutes of Exercise per Session: 40 min  Stress: No Stress Concern Present   Feeling of Stress : Not at all  Social Connections: Moderately Isolated   Frequency of Communication with Friends and Family: Twice a week   Frequency of Social Gatherings with Friends and Family: Three times a week   Attends Religious Services: Never   Active Member of Clubs or Organizations: No   Attends Engineer, structural: Never   Marital Status: Married     Family History: The patient's family history includes Cancer in her father, paternal grandfather, and paternal uncle; Diabetes in her brother, maternal grandmother, sister, and another family member; Hypertension in her father and mother; Kidney disease in her mother; Schizophrenia in her sister; Thyroid disease in her mother. There is no history of Breast cancer, Ovarian cancer, or Heart disease.  ROS:   Please see the history of present illness.     All other systems reviewed and are negative.  EKGs/Labs/Other Studies Reviewed:    The following studies were reviewed today:   EKG:  EKG is  ordered today.  The ekg ordered today demonstrates normal sinus rhythm, normal ECG.  Recent Labs: 12/04/2020: ALT 9; BUN 16; Creat 0.88; Hemoglobin 13.1; Platelets 339; Potassium 4.3; Sodium 139; TSH 0.41  Recent Lipid Panel    Component Value Date/Time   CHOL 216 (H) 12/04/2020 1049   TRIG 94 12/04/2020 1049   HDL 58 12/04/2020 1049   CHOLHDL 3.7 12/04/2020 1049   VLDL 21 11/19/2016 1513   LDLCALC 138 (H) 12/04/2020 1049    Physical Exam:    VS:  BP (!) 142/84 (BP Location: Left Arm, Patient Position: Sitting, Cuff Size: Normal)   Pulse 74   Ht 5\' 8"  (1.727 m)   Wt 199 lb (90.3 kg)   SpO2 98%   BMI 30.26 kg/m     Wt Readings from Last 3 Encounters:  12/22/20 199 lb (90.3 kg)  12/04/20 198 lb 1.6 oz (89.9 kg)  09/01/20 197  lb (89.4 kg)     GEN:  Well nourished, well developed in no acute distress HEENT: Normal NECK: No JVD; No carotid bruits LYMPHATICS: No lymphadenopathy CARDIAC: RRR, no murmurs, rubs, gallops RESPIRATORY:  Clear to auscultation without rales, wheezing or rhonchi  ABDOMEN: Soft, non-tender, non-distended MUSCULOSKELETAL:  No edema; No deformity  SKIN: Warm and dry NEUROLOGIC:  Alert and oriented x 3 PSYCHIATRIC:  Normal affect   ASSESSMENT:    1. Primary hypertension   2. Pure hypercholesterolemia     PLAN:    In order of problems listed above:  History of hypertension, BP elevated today, usually controlled.  Continue losartan 100 mg daily. Hyperlipidemia, not in statin benefit group.  Continue low-cholesterol diet..  Follow-up as needed   This note was generated in part  or whole with voice recognition software. Voice recognition is usually quite accurate but there are transcription errors that can and very often do occur. I apologize for any typographical errors that were not detected and corrected.  Medication Adjustments/Labs and Tests Ordered: Current medicines are reviewed at length with the patient today.  Concerns regarding medicines are outlined above.  Orders Placed This Encounter  Procedures   EKG 12-Lead    No orders of the defined types were placed in this encounter.   Patient Instructions  Medication Instructions:  Your physician recommends that you continue on your current medications as directed. Please refer to the Current Medication list given to you today.  *If you need a refill on your cardiac medications before your next appointment, please call your pharmacy*   Lab Work: None ordered If you have labs (blood work) drawn today and your tests are completely normal, you will receive your results only by: MyChart Message (if you have MyChart) OR A paper copy in the mail If you have any lab test that is abnormal or we need to change your treatment,  we will call you to review the results.   Testing/Procedures: None ordered   Follow-Up: At [facility], you and your health needs are our priority.  As part of our continuing mission to provide you with exceptional heart care, we have created designated Provider Care Teams.  These Care Teams include your primary Cardiologist (physician) and Advanced Practice Providers (APPs -  Physician Assistants and Nurse Practitioners) who all work together to provide you with the care you need, when you need it.  We recommend signing up for the patient portal called "MyChart".  Sign up information is provided on this After Visit Summary.  MyChart is used to connect with patients for Virtual Visits (Telemedicine).  Patients are able to view lab/test results, encounter notes, upcoming appointments, etc.  Non-urgent messages can be sent to your provider as well.   To learn more about what you can do with MyChart, go to [email or web address].    Your next appointment:   Follow up as needed   The format for your next appointment:   In Person  Provider:   You may see Debbe Odea, MD or one of the following Advanced Practice Providers on your designated Care Team:   Nicolasa Ducking, NP Eula Listen, PA-C Marisue Ivan, PA-C Cadence [place], [place]   Other Instructions    Signed, Debbe Odea, MD  12/22/2020 4:12 PM    [facility]

## 2020-12-22 NOTE — Patient Instructions (Signed)
Medication Instructions:  Your physician recommends that you continue on your current medications as directed. Please refer to the Current Medication list given to you today.  *If you need a refill on your cardiac medications before your next appointment, please call your pharmacy*   Lab Work: None ordered If you have labs (blood work) drawn today and your tests are completely normal, you will receive your results only by: MyChart Message (if you have MyChart) OR A paper copy in the mail If you have any lab test that is abnormal or we need to change your treatment, we will call you to review the results.   Testing/Procedures:  None ordered   Follow-Up: At [facility], you and your health needs are our priority.  As part of our continuing mission to provide you with exceptional heart care, we have created designated Provider Care Teams.  These Care Teams include your primary Cardiologist (physician) and Advanced Practice Providers (APPs -  Physician Assistants and Nurse Practitioners) who all work together to provide you with the care you need, when you need it.  We recommend signing up for the patient portal called "MyChart".  Sign up information is provided on this After Visit Summary.  MyChart is used to connect with patients for Virtual Visits (Telemedicine).  Patients are able to view lab/test results, encounter notes, upcoming appointments, etc.  Non-urgent messages can be sent to your provider as well.   To learn more about what you can do with MyChart, go to [email or web address].    Your next appointment:   Follow up as needed   The format for your next appointment:   In Person  Provider:   You may see Brian Agbor-Etang, MD or one of the following Advanced Practice Providers on your designated Care Team:   Christopher Berge, NP Ryan Dunn, PA-C Jacquelyn Visser, PA-C Cadence Furth, PA-C   Other Instructions   

## 2021-01-16 DIAGNOSIS — D709 Neutropenia, unspecified: Secondary | ICD-10-CM | POA: Diagnosis not present

## 2021-01-16 LAB — CBC WITH DIFFERENTIAL/PLATELET
Absolute Monocytes: 503 cells/uL (ref 200–950)
Basophils Absolute: 30 cells/uL (ref 0–200)
Basophils Relative: 0.8 %
Eosinophils Absolute: 233 cells/uL (ref 15–500)
Eosinophils Relative: 6.3 %
HCT: 36.4 % (ref 35.0–45.0)
Hemoglobin: 12.5 g/dL (ref 11.7–15.5)
Lymphs Abs: 2076 cells/uL (ref 850–3900)
MCH: 30.8 pg (ref 27.0–33.0)
MCHC: 34.3 g/dL (ref 32.0–36.0)
MCV: 89.7 fL (ref 80.0–100.0)
MPV: 10.9 fL (ref 7.5–12.5)
Monocytes Relative: 13.6 %
Neutro Abs: 858 cells/uL — ABNORMAL LOW (ref 1500–7800)
Neutrophils Relative %: 23.2 %
Platelets: 351 10*3/uL (ref 140–400)
RBC: 4.06 10*6/uL (ref 3.80–5.10)
RDW: 12.5 % (ref 11.0–15.0)
Total Lymphocyte: 56.1 %
WBC: 3.7 10*3/uL — ABNORMAL LOW (ref 3.8–10.8)

## 2021-01-18 ENCOUNTER — Ambulatory Visit: Payer: BC Managed Care – PPO | Admitting: Unknown Physician Specialty

## 2021-01-18 ENCOUNTER — Telehealth: Payer: Self-pay

## 2021-01-18 ENCOUNTER — Other Ambulatory Visit: Payer: Self-pay

## 2021-01-18 DIAGNOSIS — D729 Disorder of white blood cells, unspecified: Secondary | ICD-10-CM

## 2021-01-18 NOTE — Telephone Encounter (Signed)
Copied from CRM [id number]. Topic: General - Call Back - No Documentation >> Jan 18, 2021 12:53 PM Crist Infante wrote: Reason for CRM: pt would like cb about her labs results

## 2021-01-29 ENCOUNTER — Other Ambulatory Visit: Payer: Self-pay

## 2021-01-29 MED ORDER — LOSARTAN POTASSIUM 100 MG PO TABS
100.0000 mg | ORAL_TABLET | Freq: Every day | ORAL | 9 refills | Status: DC
Start: 2021-01-29 — End: 2021-02-01

## 2021-02-01 ENCOUNTER — Other Ambulatory Visit: Payer: Self-pay

## 2021-02-01 MED ORDER — LOSARTAN POTASSIUM 100 MG PO TABS: 100.0000 mg | ORAL_TABLET | Freq: Every day | ORAL | 0 refills | Status: DC

## 2021-02-13 IMAGING — MG DIGITAL DIAGNOSTIC BILAT W/ TOMO W/ CAD
6 of 10 series · 6 of 30 positions shown · non-contrast
Comparison: Previous exam(s).

CLINICAL DATA: Provider feels a palpable in the RIGHT breast at 12
o'clock., Diffuse breast pain bilaterally.

EXAM:
DIGITAL DIAGNOSTIC bilateral MAMMOGRAM WITH CAD AND TOMO
ULTRASOUND RIGHT BREAST

[R CC synth-2D]
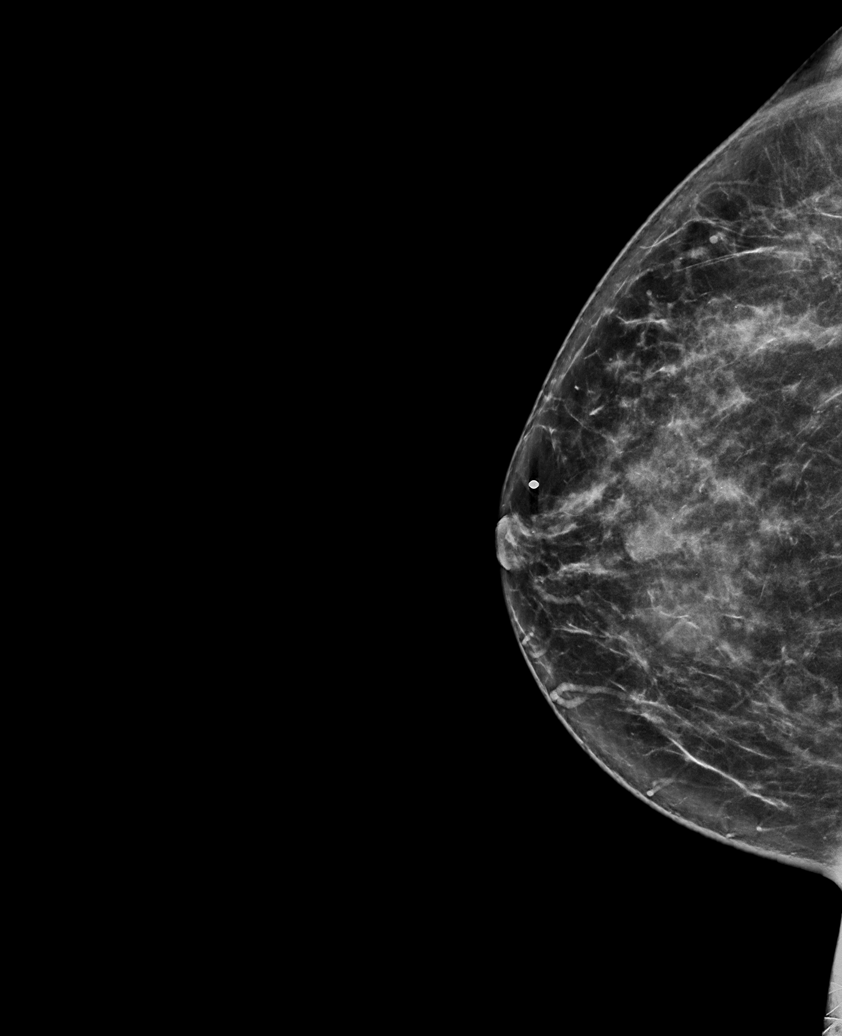

[L MLO synth-2D]
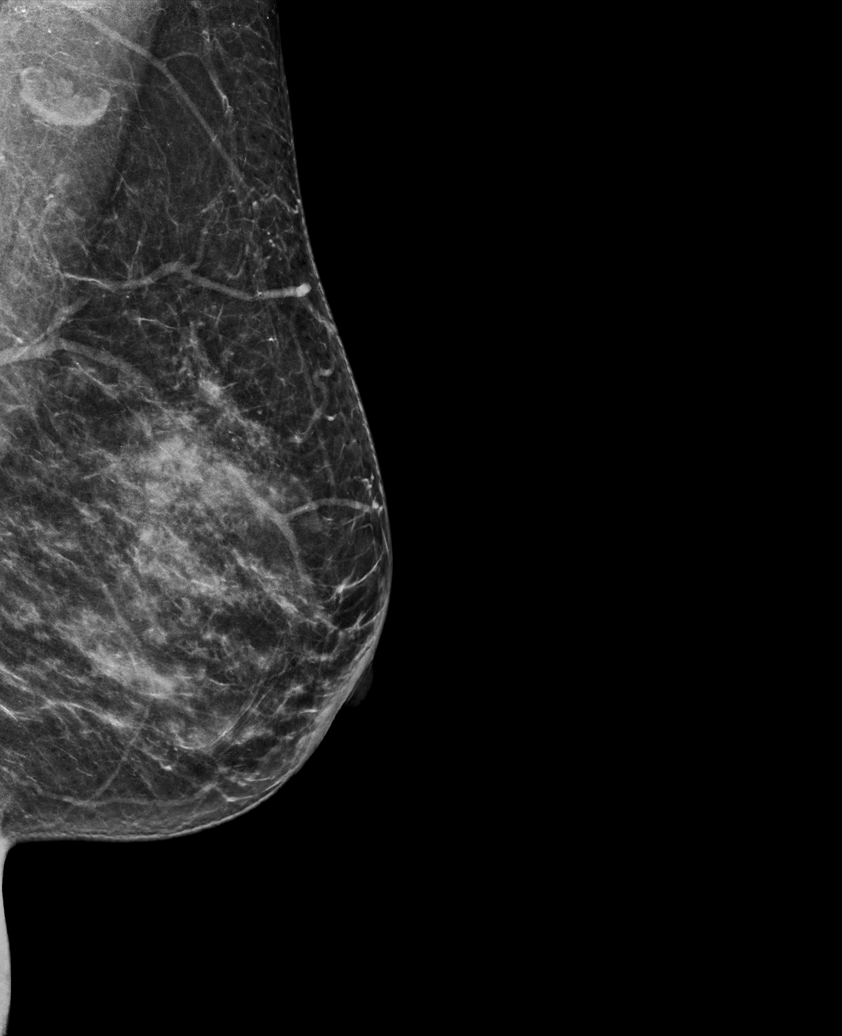

[R TAN synth-2D]
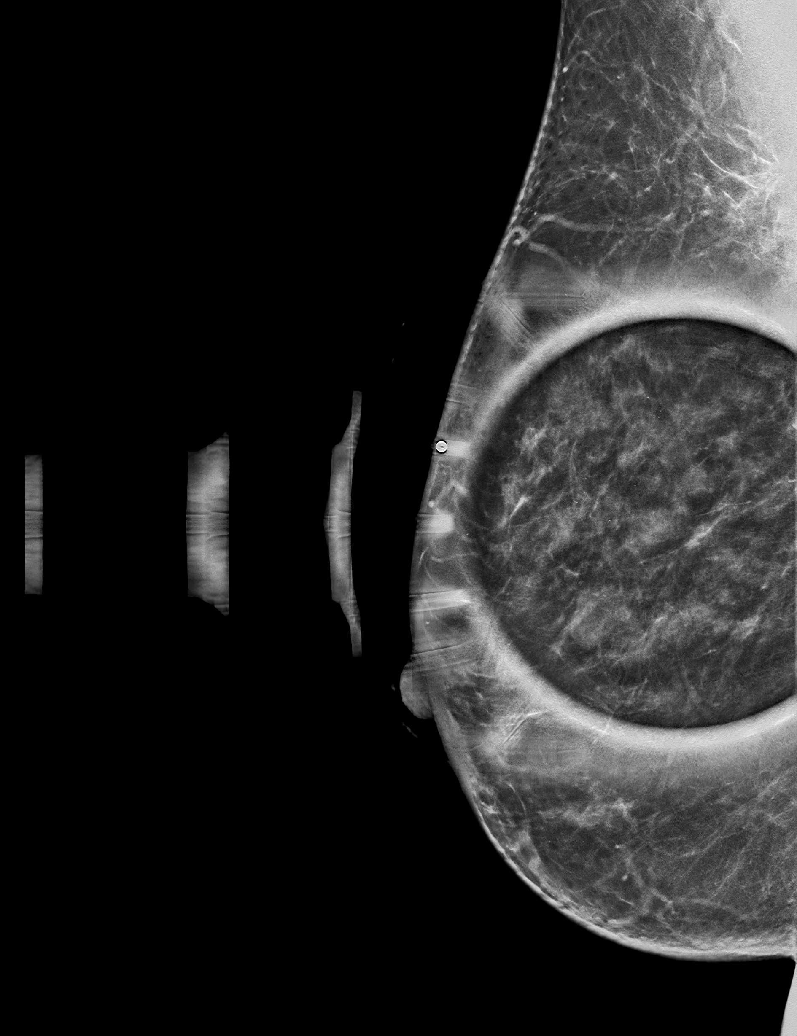

[L CC synth-2D]
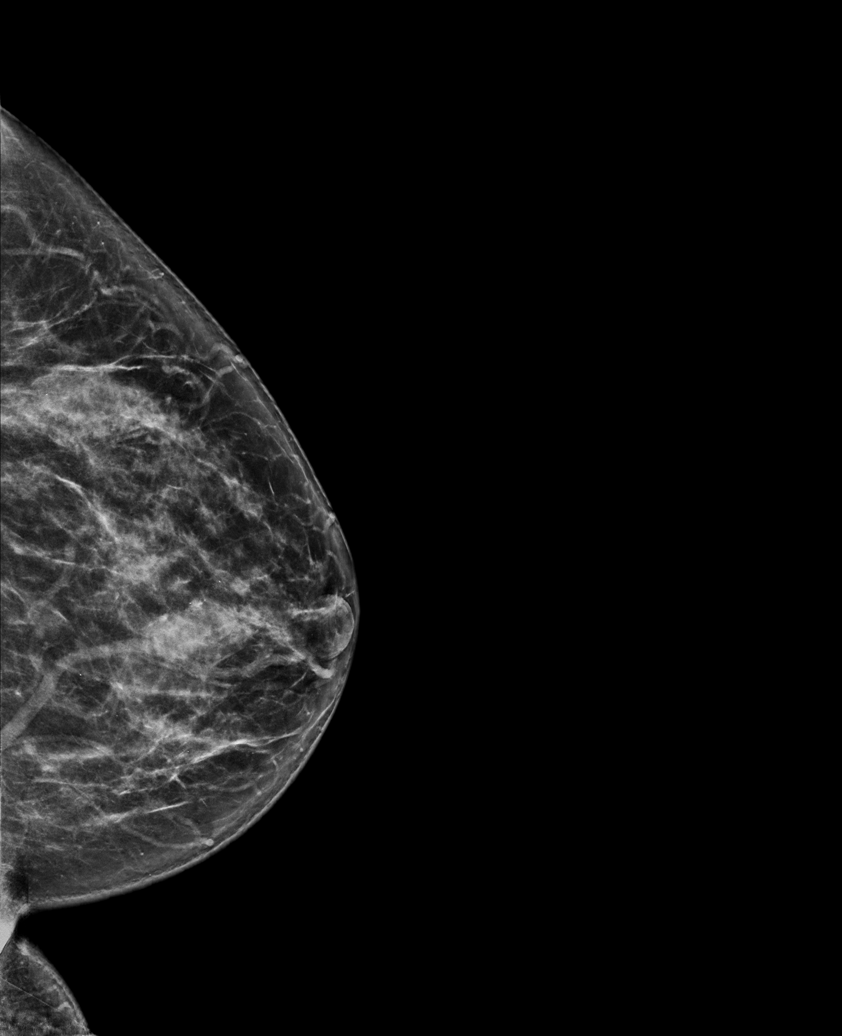

[R MLO synth-2D]
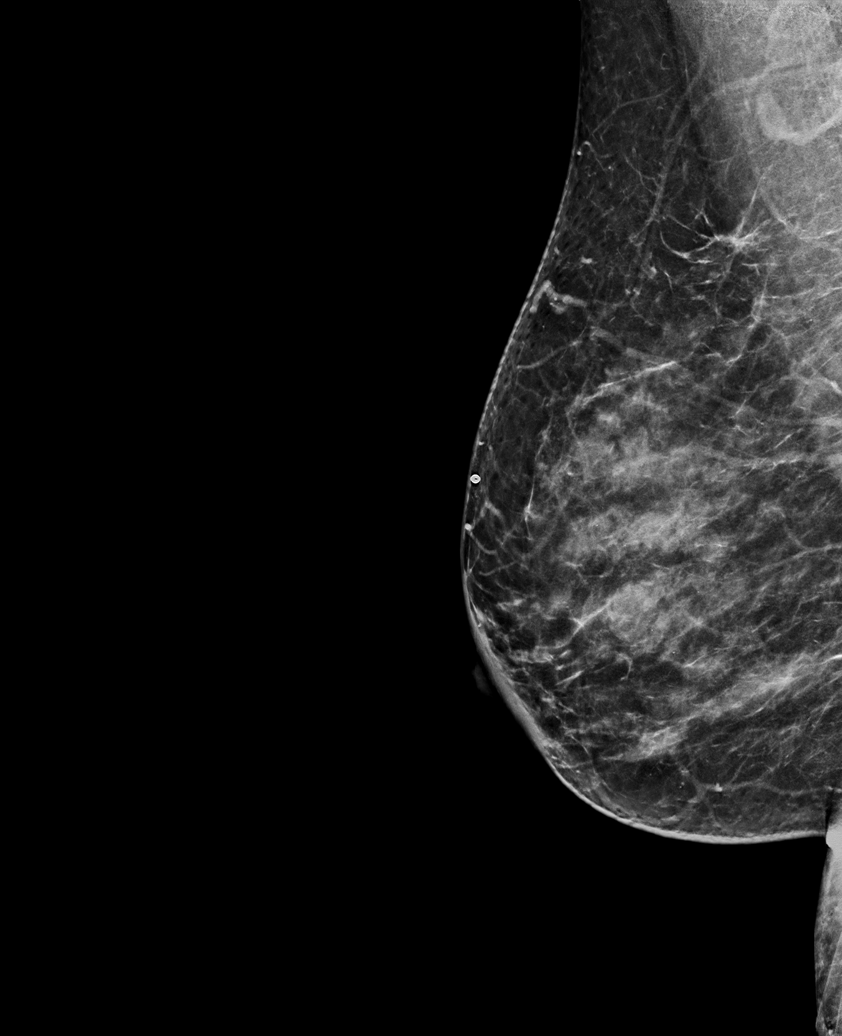

[R TAN tomo · tomo slice 33/65.0]
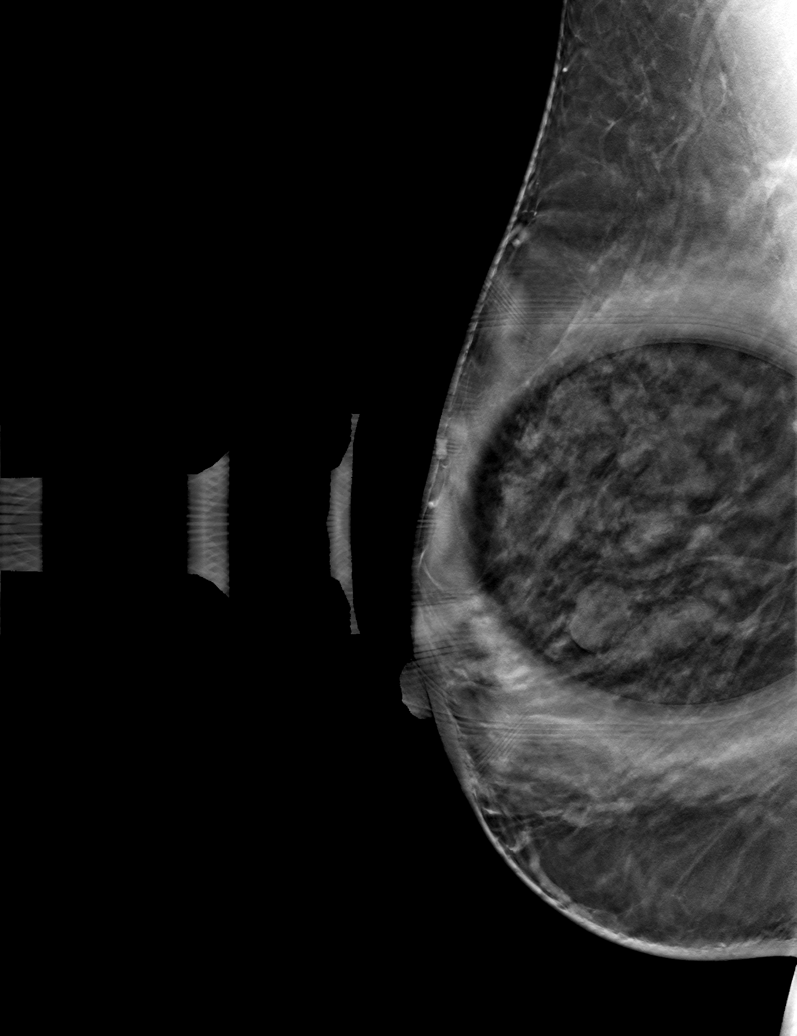

[6 of 30 positions shown; findings below may reference images not displayed]

ACR Breast Density Category c: The breast tissue is heterogeneously
dense, which may obscure small masses.
FINDINGS: There are multiple bilateral round and oval circumscribed masses,
most consistent with benign fibroadenomas or cysts.

Spot compression was obtained over the palpable area of concern in
the RIGHT breast. No suspicious mammographic finding is identified
in this area. No suspicious mass, microcalcification, or other
finding is identified in either breast.

Mammographic images were processed with CAD.

On physical exam, there is a prominent ridge of tissue without
discrete mass palpated at the site patient indicates. Additional
well-circumscribed mobile masses are palpated, consistent with known
cysts.

Targeted RIGHT breast ultrasound was performed in the palpable area
of concern at the upper inner breast. No suspicious solid or cystic
mass is identified. Multiple simple cysts are incidentally noted.
This largest cyst measures approximately 1.7 cm at 1 o'clock 1 cm
from the nipple.
IMPRESSION: 1. No mammographic evidence of malignancy. No suspicious
mammographic etiology for nonfocal breast pain. Fluctuating cysts
are seen bilaterally which could reflect a source of pain.
2. No suspicious cystic or solid mass is seen at the site of
palpable concern. Incidental note of multiple simple cysts in the
upper inner quadrant of the RIGHT breast.

RECOMMENDATION:
Screening mammogram in one year.(Code:EV-O-MWJ)

I have discussed the findings and recommendations with the patient.
If applicable, a reminder letter will be sent to the patient
regarding the next appointment.

BI-RADS CATEGORY  2: Benign.

## 2021-03-05 ENCOUNTER — Ambulatory Visit: Payer: BC Managed Care – PPO | Admitting: Family Medicine

## 2021-03-08 DIAGNOSIS — Z03818 Encounter for observation for suspected exposure to other biological agents ruled out: Secondary | ICD-10-CM | POA: Diagnosis not present

## 2021-03-08 DIAGNOSIS — J0110 Acute frontal sinusitis, unspecified: Secondary | ICD-10-CM | POA: Diagnosis not present

## 2021-03-09 ENCOUNTER — Telehealth: Payer: Self-pay | Admitting: Cardiology

## 2021-03-09 MED ORDER — AMLODIPINE BESYLATE 5 MG PO TABS: 5.0000 mg | ORAL_TABLET | Freq: Every day | ORAL | 3 refills | Status: DC

## 2021-03-09 NOTE — Telephone Encounter (Signed)
Pt c/o BP issue: STAT if pt c/o blurred vision, one-sided weakness or slurred speech  1. What are your last 5 BP readings? 168/94, 151/98  2. Are you having any other symptoms (ex. Dizziness, headache, blurred vision, passed out)? headaches  3. What is your BP issue? Elevated unknown reason

## 2021-03-09 NOTE — Telephone Encounter (Signed)
Called patient back and informed her of Dr. Merita Norton recommendation to add Norvasc 5 mg once daily, and schedule her for a follow up. Patient verbalized understanding and agreed with plan. She is scheduled for 03/19/21. Patient was grateful for the follow up.

## 2021-03-19 ENCOUNTER — Ambulatory Visit: Payer: BC Managed Care – PPO | Admitting: Cardiology

## 2021-03-20 ENCOUNTER — Encounter: Payer: Self-pay | Admitting: Cardiology

## 2021-03-23 ENCOUNTER — Other Ambulatory Visit: Payer: Self-pay

## 2021-03-23 ENCOUNTER — Ambulatory Visit: Payer: BC Managed Care – PPO | Admitting: Cardiology

## 2021-03-23 ENCOUNTER — Encounter: Payer: Self-pay | Admitting: Cardiology

## 2021-03-23 VITALS — BP 118/70 | HR 79 | Ht 68.0 in | Wt 191.0 lb

## 2021-03-23 DIAGNOSIS — I10 Essential (primary) hypertension: Secondary | ICD-10-CM | POA: Diagnosis not present

## 2021-03-23 DIAGNOSIS — E7800 Pure hypercholesterolemia, unspecified: Secondary | ICD-10-CM

## 2021-03-23 DIAGNOSIS — H5789 Other specified disorders of eye and adnexa: Secondary | ICD-10-CM

## 2021-03-23 NOTE — Patient Instructions (Signed)
Medication Instructions:  ?Your physician recommends that you continue on your current medications as directed. Please refer to the Current Medication list given to you today. ? ?*If you need a refill on your cardiac medications before your next appointment, please call your pharmacy* ? ? ?Lab Work: ?None ordered ?If you have labs (blood work) drawn today and your tests are completely normal, you will receive your results only by: ?MyChart Message (if you have MyChart) OR ?A paper copy in the mail ?If you have any lab test that is abnormal or we need to change your treatment, we will call you to review the results. ? ? ?Testing/Procedures: ?None ordered ? ? ?Follow-Up: ?At [facility], you and your health needs are our priority.  As part of our continuing mission to provide you with exceptional heart care, we have created designated Provider Care Teams.  These Care Teams include your primary Cardiologist (physician) and Advanced Practice Providers (APPs -  Physician Assistants and Nurse Practitioners) who all work together to provide you with the care you need, when you need it. ? ?We recommend signing up for the patient portal called "MyChart".  Sign up information is provided on this After Visit Summary.  MyChart is used to connect with patients for Virtual Visits (Telemedicine).  Patients are able to view lab/test results, encounter notes, upcoming appointments, etc.  Non-urgent messages can be sent to your provider as well.   ?To learn more about what you can do with MyChart, go to [email or web address].   ? ?Your next appointment:   ?As needed ? ?The format for your next appointment:   ?In Person ? ?Provider:   ?You may see Brian Agbor-Etang, MD or one of the following Advanced Practice Providers on your designated Care Team:   ?Christopher Berge, NP ?Ryan Dunn, PA-C ?Cadence Furth, PA-C{ ? ? ?Other Instructions ?N/A ? ?

## 2021-03-23 NOTE — Progress Notes (Signed)
Cardiology Office Note:    Date:  03/23/2021   ID:  Andrea Pope, DOB Aug 06, 1970, MRN [id number]  PCP:  Danelle Berry, PA-C  Cardiologist:  Debbe Odea, MD  Electrophysiologist:  None   Referring MD: Danelle Berry, PA-C   Chief Complaint  Patient presents with   Other    Elevated BP, red eyes and headaches. Meds reviewed verbally with pt.    History of Present Illness:    Andrea Pope is a 50 y.o. female with history of hypertension, anxiety who presents for follow-up.    Being seen due to elevated BPs.  Started on losartan, BPs were still elevated.  Amlodipine 5 mg added.  BP better controlled.  She states having red eyes, not dry, not itching.  Also has occasional headaches.  States being very stressed at work.  Prior notes Echocardiogram 12/2019 shows normal systolic and diastolic function, EF 60 to 65%. Coronary CTA 01/2020, calcium score 0, no evidence of CAD.   Past Medical History:  Diagnosis Date   Abnormal thyroid stimulating hormone (TSH) level 01/10/2015   Abnormal thyroid stimulating hormone level    Allergy    Anemia    iron deficiency   Encounter for initial prescription of injectable contraceptive    Female stress incontinence    Gallstones 12/05/2015   High cholesterol    History of ectopic pregnancy 11/29/2015   Hypertension    Menorrhagia with irregular cycle    Neutropenia (HCC) 07/23/2016   Ovarian cyst 11/29/2015   Ovarian cyst 11/29/2015   Patellofemoral stress syndrome 05/13/2017   Proteinuria 08/14/2015   Spider veins of both lower extremities    Status post D&C/hysteroscopy/endometrial ablation 03/24/2017   Good TVH candidate 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 Pathology: Proliferative endometrium with breakdown changes; negative for atypia/hyperplasia/carcinoma    Past Surgical History:  Procedure Laterality Date   CHOLECYSTECTOMY N/A 01/19/2016   Procedure: LAPAROSCOPIC CHOLECYSTECTOMY WITH INTRAOPERATIVE CHOLANGIOGRAM;  Surgeon:  Kieth Brightly, MD;  Location: [facility] ORS;  Service: General;  Laterality: N/A;   DILITATION & CURRETTAGE/HYSTROSCOPY WITH NOVASURE ABLATION N/A 03/24/2017   Procedure: DILATATION & CURETTAGE/HYSTEROSCOPY WITH NOVASURE ABLATION;  Surgeon: Herold Harms, MD;  Location: [facility] ORS;  Service: Gynecology;  Laterality: N/A;   laser surgery on eye Right 10/2016    Current Medications: Current Meds  Medication Sig   amLODipine (NORVASC) 5 MG tablet Take 1 tablet (5 mg total) by mouth daily.   busPIRone (BUSPAR) 5 MG tablet Take 1-2 tablets (5-10 mg total) by mouth 2 (two) times daily as needed (for anxiety/stress symptoms).   celecoxib (CELEBREX) 100 MG capsule Take by mouth as needed.   losartan (COZAAR) 100 MG tablet Take 1 tablet (100 mg total) by mouth daily.   vitamin B-12 (CYANOCOBALAMIN) 500 MCG tablet Take 1 tablet (500 mcg total) by mouth daily.     Allergies:   Contrave [naltrexone-bupropion hcl er]   Social History   Socioeconomic History   Marital status: Married    Spouse name: Ron    Number of children: 4   Years of education: Not on file   Highest education level: Bachelor's degree (e.g., BA, AB, BS)  Occupational History   Occupation: Teacher  Tobacco Use   Smoking status: Never   Smokeless tobacco: Never  Vaping Use   Vaping Use: Never used  Substance and Sexual Activity   Alcohol use: Yes    Alcohol/week: 2.0 standard drinks    Types: 2 Glasses of wine per week  Comment: twice a week   Drug use: No   Sexual activity: Yes    Partners: Male    Birth control/protection: None  Other Topics Concern   Not on file  Social History Narrative   Not on file   Social Determinants of Health   Financial Resource Strain: Low Risk    Difficulty of Paying Living Expenses: Not hard at all  Food Insecurity: No Food Insecurity   Worried About Charity fundraiser in the Last Year: Never true   [place] in the Last Year: Never true  Transportation Needs:  No Transportation Needs   Lack of Transportation (Medical): No   Lack of Transportation (Non-Medical): No  Physical Activity: Insufficiently Active   Days of Exercise per Week: 3 days   Minutes of Exercise per Session: 40 min  Stress: No Stress Concern Present   Feeling of Stress : Not at all  Social Connections: Moderately Isolated   Frequency of Communication with Friends and Family: Twice a week   Frequency of Social Gatherings with Friends and Family: Three times a week   Attends Religious Services: Never   Active Member of Clubs or Organizations: No   Attends Music therapist: Never   Marital Status: Married     Family History: The patient's family history includes Cancer in her father, paternal grandfather, and paternal uncle; Diabetes in her brother, maternal grandmother, sister, and another family member; Hypertension in her father and mother; Kidney disease in her mother; Schizophrenia in her sister; Thyroid disease in her mother. There is no history of Breast cancer, Ovarian cancer, or Heart disease.  ROS:   Please see the history of present illness.     All other systems reviewed and are negative.  EKGs/Labs/Other Studies Reviewed:    The following studies were reviewed today:   EKG:  EKG not  ordered today.   Recent Labs: 12/04/2020: ALT 9; BUN 16; Creat 0.88; Potassium 4.3; Sodium 139; TSH 0.41 01/16/2021: Hemoglobin 12.5; Platelets 351  Recent Lipid Panel    Component Value Date/Time   CHOL 216 (H) 12/04/2020 1049   TRIG 94 12/04/2020 1049   HDL 58 12/04/2020 1049   CHOLHDL 3.7 12/04/2020 1049   VLDL 21 11/19/2016 1513   LDLCALC 138 (H) 12/04/2020 1049    Physical Exam:    VS:  BP 118/70 (BP Location: Left Arm, Patient Position: Sitting, Cuff Size: Normal)   Pulse 79   Ht 5\' 8"  (1.727 m)   Wt 191 lb (86.6 kg)   SpO2 98%   BMI 29.04 kg/m     Wt Readings from Last 3 Encounters:  03/23/21 191 lb (86.6 kg)  12/22/20 199 lb (90.3 kg)   12/04/20 198 lb 1.6 oz (89.9 kg)     GEN:  Well nourished, well developed in no acute distress HEENT: Normal NECK: No JVD; No carotid bruits LYMPHATICS: No lymphadenopathy CARDIAC: RRR, no murmurs, rubs, gallops RESPIRATORY:  Clear to auscultation without rales, wheezing or rhonchi  ABDOMEN: Soft, non-tender, non-distended MUSCULOSKELETAL:  No edema; No deformity  SKIN: Warm and dry NEUROLOGIC:  Alert and oriented x 3 PSYCHIATRIC:  Normal affect   ASSESSMENT:    1. Primary hypertension   2. Pure hypercholesterolemia   3. Red eye    PLAN:    In order of problems listed above:  History of hypertension, BP now controlled with addition of amlodipine.  Continue amlodipine 5 mg daily, losartan 100 mg daily. Hyperlipidemia, not in statin  benefit group.  Continue low-cholesterol diet. Right eye, advised to follow-up with PCP regarding management.  Follow-up as needed   This note was generated in part or whole with voice recognition software. Voice recognition is usually quite accurate but there are transcription errors that can and very often do occur. I apologize for any typographical errors that were not detected and corrected.  Medication Adjustments/Labs and Tests Ordered: Current medicines are reviewed at length with the patient today.  Concerns regarding medicines are outlined above.  No orders of the defined types were placed in this encounter.   No orders of the defined types were placed in this encounter.   Patient Instructions  Medication Instructions:  Your physician recommends that you continue on your current medications as directed. Please refer to the Current Medication list given to you today.  *If you need a refill on your cardiac medications before your next appointment, please call your pharmacy*   Lab Work: None ordered If you have labs (blood work) drawn today and your tests are completely normal, you will receive your results only by: [place] (if you have MyChart) OR A paper copy in the mail If you have any lab test that is abnormal or we need to change your treatment, we will call you to review the results.   Testing/Procedures: None ordered   Follow-Up: At [facility], you and your health needs are our priority.  As part of our continuing mission to provide you with exceptional heart care, we have created designated Provider Care Teams.  These Care Teams include your primary Cardiologist (physician) and Advanced Practice Providers (APPs -  Physician Assistants and Nurse Practitioners) who all work together to provide you with the care you need, when you need it.  We recommend signing up for the patient portal called "MyChart".  Sign up information is provided on this After Visit Summary.  MyChart is used to connect with patients for Virtual Visits (Telemedicine).  Patients are able to view lab/test results, encounter notes, upcoming appointments, etc.  Non-urgent messages can be sent to your provider as well.   To learn more about what you can do with MyChart, go to [email or web address].    Your next appointment:   As needed  The format for your next appointment:   In Person  Provider:   You may see Kate Sable, MD or one of the following Advanced Practice Providers on your designated Care Team:   Murray Hodgkins, NP Christell Faith, PA-C Cadence Kathlen Mody, [place]  Other Instructions N/A   Signed, Kate Sable, MD  03/23/2021 5:13 PM    [place]

## 2021-04-10 DIAGNOSIS — M25561 Pain in right knee: Secondary | ICD-10-CM | POA: Diagnosis not present

## 2021-04-10 DIAGNOSIS — M2391 Unspecified internal derangement of right knee: Secondary | ICD-10-CM | POA: Diagnosis not present

## 2021-04-11 DIAGNOSIS — M25561 Pain in right knee: Secondary | ICD-10-CM | POA: Diagnosis not present

## 2021-04-16 DIAGNOSIS — M2241 Chondromalacia patellae, right knee: Secondary | ICD-10-CM | POA: Diagnosis not present

## 2021-06-08 ENCOUNTER — Ambulatory Visit (INDEPENDENT_AMBULATORY_CARE_PROVIDER_SITE_OTHER): Payer: BC Managed Care – PPO | Admitting: Obstetrics and Gynecology

## 2021-06-08 ENCOUNTER — Encounter: Payer: Self-pay | Admitting: Obstetrics and Gynecology

## 2021-06-08 ENCOUNTER — Other Ambulatory Visit: Payer: Self-pay

## 2021-06-08 VITALS — BP 137/83 | HR 62 | Ht 68.0 in | Wt 187.0 lb

## 2021-06-08 DIAGNOSIS — N951 Menopausal and female climacteric states: Secondary | ICD-10-CM | POA: Diagnosis not present

## 2021-06-08 DIAGNOSIS — Z01419 Encounter for gynecological examination (general) (routine) without abnormal findings: Secondary | ICD-10-CM

## 2021-06-08 NOTE — Progress Notes (Signed)
Patient presents for annual exam. Patient states experiencing night sweats frequently over the past month and would like to discuss menopause and becoming perimenopausal. Patient states no other questions or concerns.

## 2021-06-08 NOTE — Progress Notes (Signed)
HPI:      [name] is a 51 y.o. [phone] who LMP was No LMP recorded. Patient has had an ablation.  Subjective:   She presents today for her annual examination.  She also states that she has begun having night sweats and occasional hot flashes during the day.  She does report some vaginal dryness and some achiness in her joints.  She believes she may be in menopause. Of significant note patient has had an endometrial ablation and has not had menstrual periods for several years. She has recently been found to have multiple breast cysts bilaterally and had additional mammogram studies performed with no evidence of malignancy.  She has cut out caffeine as a way to see if her cyst decreased.    Hx: The following portions of the patient's history were reviewed and updated as appropriate:             She  has a past medical history of Abnormal thyroid stimulating hormone (TSH) level (01/10/2015), Abnormal thyroid stimulating hormone level, Allergy, Anemia, Encounter for initial prescription of injectable contraceptive, Female stress incontinence, Gallstones (12/05/2015), High cholesterol, History of ectopic pregnancy (11/29/2015), Hypertension, Menorrhagia with irregular cycle, Neutropenia ([place]) (07/23/2016), Ovarian cyst (11/29/2015), Ovarian cyst (11/29/2015), Patellofemoral stress syndrome (05/13/2017), Proteinuria (08/14/2015), Spider veins of both lower extremities, and Status post D&C/hysteroscopy/endometrial ablation (03/24/2017). She does not have any pertinent problems on file. She  has a past surgical history that includes Cholecystectomy (N/A, 01/19/2016); laser surgery on eye (Right, 10/2016); and Dilatation & currettage/hysteroscopy with novasure ablation (N/A, 03/24/2017). Her family history includes Cancer in her father, paternal grandfather, and paternal uncle; Diabetes in her brother, maternal grandmother, sister, and another family member; Hypertension in her father and mother; Kidney  disease in her mother; Schizophrenia in her sister; Thyroid disease in her mother. She  reports that she has never smoked. She has never used smokeless tobacco. She reports current alcohol use of about 2.0 standard drinks per week. She reports that she does not use drugs. She has a current medication list which includes the following prescription(s): buspirone, celecoxib, cholecalciferol, halog, vitamin b-12, amlodipine, and losartan. She is allergic to contrave [naltrexone-bupropion hcl er].       Review of Systems:  Review of Systems  Constitutional: Denied constitutional symptoms, night sweats, recent illness, fatigue, fever, insomnia and weight loss.  Eyes: Denied eye symptoms, eye pain, photophobia, vision change and visual disturbance.  Ears/Nose/Throat/Neck: Denied ear, nose, throat or neck symptoms, hearing loss, nasal discharge, sinus congestion and sore throat.  Cardiovascular: Denied cardiovascular symptoms, arrhythmia, chest pain/pressure, edema, exercise intolerance, orthopnea and palpitations.  Respiratory: Denied pulmonary symptoms, asthma, pleuritic pain, productive sputum, cough, dyspnea and wheezing.  Gastrointestinal: Denied, gastro-esophageal reflux, melena, nausea and vomiting.  Genitourinary: Denied genitourinary symptoms including symptomatic vaginal discharge, pelvic relaxation issues, and urinary complaints.  Musculoskeletal: Denied musculoskeletal symptoms, stiffness, swelling, muscle weakness and myalgia.  Dermatologic: Denied dermatology symptoms, rash and scar.  Neurologic: Denied neurology symptoms, dizziness, headache, neck pain and syncope.  Psychiatric: Denied psychiatric symptoms, anxiety and depression.  Endocrine: See HPI for additional information.   Meds:   Current Outpatient Medications on File Prior to Visit  Medication Sig Dispense Refill   busPIRone (BUSPAR) 5 MG tablet Take 1-2 tablets (5-10 mg total) by mouth 2 (two) times daily as needed (for  anxiety/stress symptoms). 120 tablet 1   celecoxib (CELEBREX) 100 MG capsule Take by mouth as needed.     Cholecalciferol (D3 PO) Take by mouth.  halcinonide (HALOG) 0.1 % external solution      vitamin B-12 (CYANOCOBALAMIN) 500 MCG tablet Take 1 tablet (500 mcg total) by mouth daily. 90 tablet 3   amLODipine (NORVASC) 5 MG tablet Take 1 tablet (5 mg total) by mouth daily. 30 tablet 3   losartan (COZAAR) 100 MG tablet Take 1 tablet (100 mg total) by mouth daily. 90 tablet 0   No current facility-administered medications on file prior to visit.      Objective:     Vitals:   06/08/21 1057  BP: 137/83  Pulse: 62    Filed Weights   06/08/21 1057  Weight: 187 lb (84.8 kg)              Physical examination General NAD, Conversant  HEENT Atraumatic; Op clear with mmm.  Normo-cephalic. Pupils reactive. Anicteric sclerae  Thyroid/Neck Smooth without nodularity or enlargement. Normal ROM.  Neck Supple.  Skin No rashes, lesions or ulceration. Normal palpated skin turgor. No nodularity.  Breasts: Multiple mobile small cyst bilaterally.  Left breast 2:00 1 x 2.5 cm mobile cystic mass.  Symmetric.  No axillary adenopathy.  Lungs: Clear to auscultation.No rales or wheezes. Normal Respiratory effort, no retractions.  Heart: NSR.  No murmurs or rubs appreciated. No periferal edema  Abdomen: Soft.  Non-tender.  No masses.  No HSM. No hernia  Extremities: Moves all appropriately.  Normal ROM for age. No lymphadenopathy.  Neuro: Oriented to PPT.  Normal mood. Normal affect.     Pelvic:   Vulva: Normal appearance.  No lesions.  Vagina: No lesions or abnormalities noted.  Support: Normal pelvic support.  Urethra No masses tenderness or scarring.  Meatus Normal size without lesions or prolapse.  Cervix: Normal appearance.  No lesions.  Anus: Normal exam.  No lesions.  Perineum: Normal exam.  No lesions.        Bimanual   Uterus: Normal size.  Non-tender.  Mobile.  AV.  Adnexae: No  masses.  Non-tender to palpation.  Cul-de-sac: Negative for abnormality.     Assessment:    [id number] Patient Active Problem List   Diagnosis Date Noted   Prediabetes 12/04/2020   Mixed hyperlipidemia 12/04/2020   Hypertension, benign 12/03/2019   Fibrocystic breast changes, bilateral 12/03/2019   Mild episode of recurrent major depressive disorder ([place]) 03/26/2019   Obstructive sleep apnea 12/30/2018   Absolute anemia 01/09/2018   Vitamin B12 deficiency 01/08/2018   Vitamin D deficiency 01/08/2018   Positive ANA (antinuclear antibody) 12/15/2017   Primary osteoarthritis of right knee 05/13/2017   Overweight (BMI 25.0-29.9) 11/19/2016   White coat syndrome with hypertension 01/25/2016   Dysmenorrhea 01/03/2016   Allergic rhinitis, seasonal 01/10/2015   Asymptomatic varicose veins of both lower extremities 01/10/2015     1. Well woman exam with routine gynecological exam   2. Symptomatic menopausal or female climacteric states     Patient either in climacteric or early menopause based on symptoms.  Small left breast cyst-patient recent had mammogram showing multiple cysts but none of concern.   Plan:            1.  Basic Screening Recommendations The basic screening recommendations for asymptomatic women were discussed with the patient during her visit.  The age-appropriate recommendations were discussed with her and the rational for the tests reviewed.  When I am informed by the patient that another primary care physician has previously obtained the age-appropriate tests and they are up-to-date, only outstanding tests are ordered and referrals given  as necessary.  Abnormal results of tests will be discussed with her when all of her results are completed.  Routine preventative health maintenance measures emphasized: Exercise/Diet/Weight control, Tobacco Warnings, Alcohol/Substance use risks and Stress Management Mammogram follow-up as directed 2.  [place] to rule out menopause  versus climacteric.  Patient will consider HRT based on results 3.  Patient to perform self breast exams on left breast mass and make sure that it resolves in 6 weeks or at least decreases in size with her decreased caffeine and time.  If not consider follow-up ultrasound as needed. (Strongly doubt breast cancer)  Orders Orders Placed This Encounter  Procedures   Follicle stimulating hormone    No orders of the defined types were placed in this encounter.        F/U  No follow-ups on file.  Finis Bud, M.D. 06/08/2021 12:06 PM

## 2021-06-09 LAB — FOLLICLE STIMULATING HORMONE: FSH: 56.7 m[IU]/mL

## 2021-06-09 NOTE — Progress Notes (Signed)
Andrea Pope: Your blood test does show as we both suspected - You are in menopause. As we discussed, I would be happy to schedule a visit with you to discuss this and ways to alleviate some of the annoying symptoms that go with it. We can also do this as a video visit if you prefer. Dr. Logan Bores

## 2021-06-14 ENCOUNTER — Telehealth: Payer: BC Managed Care – PPO | Admitting: Obstetrics and Gynecology

## 2021-06-24 DIAGNOSIS — J020 Streptococcal pharyngitis: Secondary | ICD-10-CM | POA: Diagnosis not present

## 2021-06-24 DIAGNOSIS — Z03818 Encounter for observation for suspected exposure to other biological agents ruled out: Secondary | ICD-10-CM | POA: Diagnosis not present

## 2021-06-24 DIAGNOSIS — R051 Acute cough: Secondary | ICD-10-CM | POA: Diagnosis not present

## 2021-06-24 DIAGNOSIS — J029 Acute pharyngitis, unspecified: Secondary | ICD-10-CM | POA: Diagnosis not present

## 2021-06-28 ENCOUNTER — Encounter: Payer: Self-pay | Admitting: Obstetrics and Gynecology

## 2021-06-28 ENCOUNTER — Telehealth (INDEPENDENT_AMBULATORY_CARE_PROVIDER_SITE_OTHER): Payer: BC Managed Care – PPO | Admitting: Obstetrics and Gynecology

## 2021-06-28 DIAGNOSIS — N951 Menopausal and female climacteric states: Secondary | ICD-10-CM | POA: Diagnosis not present

## 2021-06-28 DIAGNOSIS — Z79890 Hormone replacement therapy (postmenopausal): Secondary | ICD-10-CM | POA: Diagnosis not present

## 2021-06-28 MED ORDER — ESTRADIOL-NORETHINDRONE ACET 1-0.5 MG PO TABS: 1.0000 | ORAL_TABLET | Freq: Every day | ORAL | 1 refills | Status: DC

## 2021-06-28 NOTE — Progress Notes (Signed)
Virtual Visit via Video Note  I connected with [name] on 06/28/21 at  7:30 AM EST by video and verified that I was speaking with the correct person using two identifiers.    [name] is a 51 y.o. [phone] who LMP was No LMP recorded. Patient has had an ablation. I discussed the limitations, risks, security and privacy concerns of performing an evaluation and management service by video and the availability of in person appointments. I also discussed with the patient that there may be a patient responsible charge related to this service. The patient expressed understanding and agreed to proceed.  Location of patient:  Work  Patient gave explicit verbal consent for video visit:  YES  Location of provider:  [facility] office  Persons other than physician and patient involved in provider conference:  None   Subjective:   History of Present Illness:    She has been having signs and symptoms of menopause including hot flashes and vaginal dryness.  She is amenorrhea because she has had an endometrial ablation.  Her vasomotor symptoms of menopause are disturbing enough that she is considering HRT for resolution.  I performed an [place] to diagnose menopause and it was indeed elevated as suspected.  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 She does not have any pertinent problems on file. She  has a past surgical history that includes Cholecystectomy (N/A, 01/19/2016); laser surgery on eye (Right, 10/2016); and Dilatation & currettage/hysteroscopy with novasure ablation (N/A, 03/24/2017). Her family history includes Cancer in her father, paternal grandfather, and paternal uncle; Diabetes in her brother, maternal grandmother, sister, and another family member; Hypertension in her father and mother; Kidney disease in her mother; Schizophrenia in her sister; Thyroid disease in her mother. She  reports that she has never smoked. She has never used smokeless tobacco. She reports current alcohol use of about 2.0 standard drinks per week. She reports that she does not use drugs. She has a current medication list which includes the following prescription(s): estradiol-norethindrone, amlodipine, buspirone, celecoxib, cholecalciferol, halog, losartan, and vitamin b-12. She is allergic to contrave [naltrexone-bupropion hcl er].       Review of Systems:  Review of Systems  Constitutional: Denied constitutional symptoms, night sweats, recent illness, fatigue, fever, insomnia and weight loss.  Eyes: Denied eye symptoms, eye pain, photophobia, vision change and visual disturbance.  Ears/Nose/Throat/Neck: Denied ear, nose, throat or neck symptoms, hearing loss, nasal discharge, sinus congestion and sore throat.  Cardiovascular: Denied cardiovascular symptoms, arrhythmia, chest pain/pressure, edema, exercise intolerance, orthopnea and palpitations.  Respiratory: Denied pulmonary symptoms, asthma, pleuritic pain, productive sputum, cough, dyspnea and wheezing.  Gastrointestinal: Denied, gastro-esophageal reflux, melena, nausea and vomiting.  Genitourinary: Denied genitourinary symptoms including symptomatic vaginal discharge, pelvic relaxation issues, and urinary complaints.  Musculoskeletal: Denied  musculoskeletal symptoms, stiffness, swelling, muscle weakness and myalgia.  Dermatologic: Denied dermatology symptoms, rash and scar.  Neurologic: Denied neurology symptoms, dizziness, headache, neck pain and syncope.  Psychiatric: Denied psychiatric symptoms, anxiety and depression.  Endocrine: See HPI for additional information.   Meds:   Current Outpatient Medications on File Prior to Visit  Medication Sig Dispense Refill   amLODipine (NORVASC) 5 MG tablet Take 1 tablet (5 mg total) by mouth daily. 30 tablet 3   busPIRone (BUSPAR) 5 MG tablet Take 1-2 tablets (5-10 mg total) by mouth 2 (two) times daily as needed (for anxiety/stress symptoms). 120 tablet 1   celecoxib (CELEBREX) 100 MG capsule Take by mouth as needed.     Cholecalciferol (D3 PO) Take by mouth.     halcinonide (HALOG) 0.1 % external solution      losartan (COZAAR) 100 MG tablet Take 1 tablet (100 mg total) by mouth daily. 90 tablet 0   vitamin B-12 (CYANOCOBALAMIN) 500 MCG tablet Take 1 tablet (500 mcg total) by mouth daily. 90 tablet 3   No current facility-administered medications on file prior to visit.    Assessment:    [id number] Patient Active Problem List   Diagnosis Date Noted   Prediabetes 12/04/2020   Mixed hyperlipidemia 12/04/2020   Hypertension, benign 12/03/2019   Fibrocystic breast changes, bilateral 12/03/2019   Mild episode of recurrent major depressive disorder ([place]) 03/26/2019   Obstructive sleep apnea 12/30/2018   Absolute anemia 01/09/2018   Vitamin B12 deficiency 01/08/2018   Vitamin D deficiency 01/08/2018   Positive ANA (antinuclear antibody) 12/15/2017   Primary osteoarthritis of right knee 05/13/2017   Overweight (BMI 25.0-29.9) 11/19/2016   White coat syndrome with hypertension 01/25/2016   Dysmenorrhea 01/03/2016   Allergic rhinitis, seasonal 01/10/2015   Asymptomatic varicose veins of both lower extremities 01/10/2015     1. Symptomatic menopausal or female climacteric states    2. Postmenopausal hormone therapy       Plan:            1.  HRT I have discussed HRT with the patient in detail.  The risk/benefits of it were reviewed.  She understands that during menopause Estrogen decreases dramatically and that this results in an increased risk of cardiovascular disease as well as osteoporosis.  We have also discussed the fact that hot flashes often result from a decrease in Estrogen, and that by replacing Estrogen, they can often be alleviated.  We have discussed skin, vaginal and urinary tract changes that may also take place from this drop in Estrogen.  Emotional changes have also been linked to Estrogen and we have briefly discussed this.  The benefits of HRT including decrease in hot flashes, vaginal dryness, and osteoporosis were discussed.  The emotional benefit and a possible change in her cardiovascular risk profile was also reviewed.  The risks associated with Hormone Replacement Therapy were also reviewed.  The use of unopposed Estrogen and its relationship to endometrial cancer was discussed.  The addition of Progesterone and its beneficial effect on endometrial cancer was also noted.  The fact that there has been no consistent definitive studies showing an increase in breast cancer in women who use HRT was discussed with the patient.  The possible side effects including breast tenderness, fluid retention, mood changes and vaginal bleeding were discussed.  The patient was informed that this is an elective medication and that she may choose not to take Hormone Replacement Therapy.  Literature on HRT was made available, and I believe that after answering all of the patients questions.  Special emphasis on the WHI study, as well as several studies since that pertaining to the risks and  benefits of estrogen replacement therapy were compared.  The possible limitations of these studies were discussed including the age stratification of the WHI study.  The possible increased  role of Progesterone in these studies was discussed in detail.  Different types of hormone formulation and methods of taking hormone replacement therapy were discussed. Literature on HRT was made available, and I believe that after answering all of the patients questions she has an adequate and informed understanding of the risks and benefits of HRT. She is elected to begin [facility] Orders No orders of the defined types were placed in this encounter.    Meds ordered this encounter  Medications   estradiol-norethindrone (ACTIVELLA) 1-0.5 MG tablet    Sig: Take 1 tablet by mouth daily.    Dispense:  90 tablet    Refill:  1      F/U  Return in about 3 months (around 09/25/2021). I spent 22 minutes involved in the care of this patient preparing to see the patient by obtaining and reviewing her medical history (including labs, imaging tests and prior procedures), documenting clinical information in the electronic health record (EHR), counseling and coordinating care plans, writing and sending prescriptions, ordering tests or procedures and in direct communicating with the patient and medical staff discussing pertinent items from her history and physical exam.   Finis Bud, M.D. 06/28/2021 7:41 AM

## 2021-07-20 DIAGNOSIS — M5416 Radiculopathy, lumbar region: Secondary | ICD-10-CM | POA: Diagnosis not present

## 2021-07-29 ENCOUNTER — Encounter: Payer: Self-pay | Admitting: Cardiology

## 2021-07-30 ENCOUNTER — Encounter: Payer: Self-pay | Admitting: Family Medicine

## 2021-08-01 ENCOUNTER — Telehealth (INDEPENDENT_AMBULATORY_CARE_PROVIDER_SITE_OTHER): Payer: BC Managed Care – PPO | Admitting: Family Medicine

## 2021-08-01 DIAGNOSIS — Z0289 Encounter for other administrative examinations: Secondary | ICD-10-CM

## 2021-08-01 NOTE — Progress Notes (Signed)
? ?Name: Andrea Pope   MRN: [id number]    DOB: [birth year]   Date:08/01/2021 ? ?     Progress Note ? ?Subjective:  ? ? ?Chief Complaint ? ?Chief Complaint  ?Patient presents with  ? Advice Only  ?  Pt needs advice if its ok for her to do cryoslimming procedure with her being diagnosed with Hypertension.  ? ? ?I connected with  Kristell K Pompei on 08/01/21 at  3:20 PM EDT by telephone and verified that I am speaking with the correct person using two identifiers. ?  ?I discussed the limitations, risks, security and privacy concerns of performing an evaluation and management service by telephone and the availability of in person appointments. Staff also discussed with the patient that there may be a patient responsible charge related to this service.  Patient verbalized understanding and agreed to proceed with encounter. ?Patient Location: home ?Provider Location: home office ?Additional Individuals present: none ? ?HPI ? ?Requests advice regarding coolsculpting, cryolipolysis  ?Reviewed FDA info and pt handout, as well as pt's PMHx problem list, meds ?There are no contraindications per her med hx ?Hypertension:  ?Has been well controlled ?BP Readings from Last 3 Encounters:  ?06/08/21 137/83  ?03/23/21 118/70  ?12/22/20 (!) 142/84  ? ?Facility [employer] shopping ? ? ? ? ? ? ?Patient Active Problem List  ? Diagnosis Date Noted  ? Prediabetes 12/04/2020  ? Mixed hyperlipidemia 12/04/2020  ? Hypertension, benign 12/03/2019  ? Fibrocystic breast changes, bilateral 12/03/2019  ? Mild episode of recurrent major depressive disorder ([facility]) 03/26/2019  ? Obstructive sleep apnea 12/30/2018  ? Absolute anemia 01/09/2018  ? Vitamin B12 deficiency 01/08/2018  ? Vitamin D deficiency 01/08/2018  ? Positive ANA (antinuclear antibody) 12/15/2017  ? Primary osteoarthritis of right knee 05/13/2017  ? Overweight (BMI 25.0-29.9) 11/19/2016  ? White coat syndrome with hypertension 01/25/2016  ? Dysmenorrhea  01/03/2016  ? Allergic rhinitis, seasonal 01/10/2015  ? Asymptomatic varicose veins of both lower extremities 01/10/2015  ? ? ?Social History  ? ?Tobacco Use  ? Smoking status: Never  ? Smokeless tobacco: Never  ?Substance Use Topics  ? Alcohol use: Yes  ?  Alcohol/week: 2.0 standard drinks  ?  Types: 2 Glasses of wine per week  ?  Comment: twice a week  ? ? ? ?Current Outpatient Medications:  ?  busPIRone (BUSPAR) 5 MG tablet, Take 1-2 tablets (5-10 mg total) by mouth 2 (two) times daily as needed (for anxiety/stress symptoms)., Disp: 120 tablet, Rfl: 1 ?  celecoxib (CELEBREX) 100 MG capsule, Take by mouth as needed., Disp: , Rfl:  ?  Cholecalciferol (D3 PO), Take by mouth., Disp: , Rfl:  ?  estradiol-norethindrone (ACTIVELLA) 1-0.5 MG tablet, Take 1 tablet by mouth daily., Disp: 90 tablet, Rfl: 1 ?  halcinonide (HALOG) 0.1 % external solution, , Disp: , Rfl:  ?  methylPREDNISolone (MEDROL DOSEPAK) 4 MG TBPK tablet, Take by mouth as directed., Disp: , Rfl:  ?  tiZANidine (ZANAFLEX) 4 MG tablet, Take 4 mg by mouth every 6 (six) hours., Disp: , Rfl:  ?  vitamin B-12 (CYANOCOBALAMIN) 500 MCG tablet, Take 1 tablet (500 mcg total) by mouth daily., Disp: 90 tablet, Rfl: 3 ?  amLODipine (NORVASC) 5 MG tablet, Take 1 tablet (5 mg total) by mouth daily., Disp: 30 tablet, Rfl: 3 ?  losartan (COZAAR) 100 MG tablet, Take 1 tablet (100 mg total) by mouth daily., Disp: 90 tablet, Rfl: 0 ? ?Allergies  ?Allergen Reactions  ?  Contrave [Naltrexone-Bupropion Hcl Er]   ?  Palpitations, hypertension  ? ? ?Chart Review: ?I personally reviewed active problem list, medication list, allergies, family history, social history, health maintenance, notes from last encounter, lab results, imaging with the patient/caregiver today. ?Reviewed forms, researched procedure, studies and fda documents related to what pt was asking for clearance for ? ?Review of Systems ?10 Systems reviewed and are negative for acute change except as noted in the  HPI. ? ? ?Objective:  ? ? ?Virtual encounter, vitals limited, only able to obtain the following ?There were no vitals filed for this visit. ?There is no height or weight on file to calculate BMI. ?Nursing Note and Vital Signs reviewed. ? ?Physical Exam ?Pt alert, phonation clear, mood normal, NAD ?PE limited by telephone encounter ? ?No results found for this or any previous visit (from the past 72 hour(s)). ? ?Assessment and Plan:  ? ?  ICD-10-CM   ?1. Encounter for completion of form with patient  Z02.89   ? discussed cosmetic/aesthetic procedure and contraindications - letter sent to pt stating no contraindications and can procede   ?  ? ? ?-Red flags and when to present for emergency care or RTC including but not limited to new/worsening/un-resolving symptoms,  reviewed with patient at time of visit. Follow up and care instructions discussed and provided in AVS. ?- I discussed the assessment and treatment plan with the patient. The patient was provided an opportunity to ask questions and all were answered. The patient agreed with the plan and demonstrated an understanding of the instructions. ? - The patient was advised to call back or seek an in-person evaluation if the symptoms worsen or if the condition fails to improve as anticipated. ? ?I provided 15 minutes of non-face-to-face time during this encounter. ? ?Danelle Berry, PA-C ?08/01/21 3:52 PM ? ?

## 2021-08-02 ENCOUNTER — Other Ambulatory Visit: Payer: Self-pay | Admitting: *Deleted

## 2021-08-02 MED ORDER — AMLODIPINE BESYLATE 5 MG PO TABS: 5.0000 mg | ORAL_TABLET | Freq: Every day | ORAL | 3 refills | Status: DC

## 2021-08-14 DIAGNOSIS — S39012D Strain of muscle, fascia and tendon of lower back, subsequent encounter: Secondary | ICD-10-CM | POA: Diagnosis not present

## 2021-08-31 DIAGNOSIS — M5416 Radiculopathy, lumbar region: Secondary | ICD-10-CM | POA: Diagnosis not present

## 2021-09-24 ENCOUNTER — Ambulatory Visit: Payer: Self-pay

## 2021-09-24 NOTE — Telephone Encounter (Signed)
  Chief Complaint: Very mild SOB - anxiety? Feels weird. Symptoms: ibid Frequency: 1 week ago Pertinent Negatives: Patient denies Fever, chest pain, palpitations Disposition: [] ED /[] Urgent Care (no appt availability in office) / [x] Appointment(In office/virtual)/ []  [place] [facility]/ [] Home Care/ [] Refused Recommended Disposition /[] [place] [facility]/ []  Follow-up with PCP Additional Notes: PT states she has SOB that sort of feels like anxiety. Also feels weird. 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 Reason for Disposition  [1] MILD longstanding difficulty breathing AND [2]  SAME as normal  Answer Assessment - Initial Assessment Questions 1. RESPIRATORY STATUS: "Describe your breathing?" (e.g., wheezing, shortness of breath, unable to speak, severe coughing)      Like anxiety 2. ONSET: "When did this breathing problem begin?"      1 week ago 3. PATTERN "Does the difficult breathing come and go, or has it been constant since it started?"      Comes and goes 4. SEVERITY: "How bad is your breathing?" (e.g., mild, moderate, severe)    - MILD: No SOB at rest, mild SOB with walking, speaks normally in sentences, can lie down, no retractions, pulse < 100.    - MODERATE: SOB at rest, SOB with minimal exertion and prefers to sit, cannot lie down flat, speaks in phrases, mild retractions, audible wheezing, pulse 100-120.    - SEVERE: Very SOB at rest, speaks in single words, struggling to breathe, sitting hunched forward, retractions, pulse > 120      mild 5. RECURRENT SYMPTOM: "Have you had difficulty breathing before?" If Yes, ask: "When was the last time?" and "What happened that time?"      no 6. CARDIAC HISTORY: "Do you have any history of heart disease?" (e.g., heart attack, angina, bypass surgery, angioplasty)      no 7. LUNG HISTORY: "Do you have any history of lung disease?"  (e.g., pulmonary embolus, asthma, emphysema)     no 8. CAUSE:  "What do you think is causing the breathing problem?"      bronchitis 9. OTHER SYMPTOMS: "Do you have any other symptoms? (e.g., dizziness, runny nose, cough, chest pain, fever)     dizziness 10. O2 SATURATION MONITOR:  "Do you use an oxygen saturation monitor (pulse oximeter) at home?" If Yes, "What is your reading (oxygen level) today?" "What is your usual oxygen saturation reading?" (e.g., 95%)        11. PREGNANCY: "Is there any chance you are pregnant?" "When was your last menstrual period?"        12. TRAVEL: "Have you traveled out of the country in the last month?" (e.g., travel history, exposures)  Protocols used: Breathing Difficulty-A-AH

## 2021-09-25 ENCOUNTER — Ambulatory Visit (INDEPENDENT_AMBULATORY_CARE_PROVIDER_SITE_OTHER): Payer: BC Managed Care – PPO | Admitting: Family Medicine

## 2021-09-25 ENCOUNTER — Encounter: Payer: Self-pay | Admitting: Family Medicine

## 2021-09-25 VITALS — BP 118/76 | HR 69 | Temp 98.3°F | Resp 16 | Ht 68.0 in | Wt 194.8 lb

## 2021-09-25 DIAGNOSIS — R350 Frequency of micturition: Secondary | ICD-10-CM

## 2021-09-25 DIAGNOSIS — R110 Nausea: Secondary | ICD-10-CM

## 2021-09-25 DIAGNOSIS — R531 Weakness: Secondary | ICD-10-CM | POA: Diagnosis not present

## 2021-09-25 DIAGNOSIS — R251 Tremor, unspecified: Secondary | ICD-10-CM | POA: Diagnosis not present

## 2021-09-25 DIAGNOSIS — R140 Abdominal distension (gaseous): Secondary | ICD-10-CM

## 2021-09-25 DIAGNOSIS — R5383 Other fatigue: Secondary | ICD-10-CM

## 2021-09-25 LAB — POCT URINALYSIS DIPSTICK
Blood, UA: NEGATIVE
Glucose, UA: NEGATIVE
Nitrite, UA: NEGATIVE
Odor: NORMAL
Protein, UA: POSITIVE — AB
Spec Grav, UA: <=1.005 — AB (ref 1.010–1.025)
Urobilinogen, UA: 0.2 E.U./dL
pH, UA: 5 (ref 5.0–8.0)

## 2021-09-25 LAB — POCT GLYCOSYLATED HEMOGLOBIN (HGB A1C): Hemoglobin A1C: 5.4 % (ref 4.0–5.6)

## 2021-09-25 LAB — GLUCOSE, POCT (MANUAL RESULT ENTRY): POC Glucose: 120 mg/dl — AB (ref 70–99)

## 2021-09-25 MED ORDER — PANTOPRAZOLE SODIUM 20 MG PO TBEC: 20.0000 mg | DELAYED_RELEASE_TABLET | ORAL | 1 refills | Status: DC

## 2021-09-25 NOTE — Progress Notes (Signed)
Patient ID: Andrea Pope, female    DOB: 27-Sep-1970, 51 y.o.   MRN: [id number]  PCP: Danelle Berryapia, Patrica Mendell, PA-C  Chief Complaint  Patient presents with   Shortness of Breath   Nausea   Fatigue    Subjective:   Andrea Pope is a 51 y.o. female, presents to clinic with CC of the following:  HPI    Sx for 2 weeks Pt getting SOB/dizzy with nausea after eating - concerned that she had bloating after eating bread, no diarrhea, vomiting, change in bowels.  She feels extremely full with eating. Denies any pain, reflux, indigestion Concerned that it may be DM - because her husband has DM Lab Results  Component Value Date   HGBA1C 5.5 12/04/2020  Sugar checked today was 120- she ate one hour ago  Urinary frequency every 30 min - gradual onset worse in the last 1-2 years, wears a pad all the time now, previously saw urology  Results for orders placed or performed in visit on 09/25/21  POCT Glucose (CBG)  Result Value Ref Range   POC Glucose 120 (A) 70 - 99 mg/dl  POCT Urinalysis Dipstick  Result Value Ref Range   Color, UA Dark Yellow    Clarity, UA Clear    Glucose, UA Negative Negative   Bilirubin, UA Small    Ketones, UA Trace    Spec Grav, UA <=1.005 (A) 1.010 - 1.025   Blood, UA Negative    pH, UA 5.0 5.0 - 8.0   Protein, UA Positive (A) Negative   Urobilinogen, UA 0.2 0.2 or 1.0 E.U./dL   Nitrite, UA Negative    Leukocytes, UA Small (1+) (A) Negative   Appearance Cloudy    Odor Normal   POCT HgB A1C  Result Value Ref Range   Hemoglobin A1C 5.4 4.0 - 5.6 %   HbA1c POC (<> result, manual entry)     HbA1c, POC (prediabetic range)     HbA1c, POC (controlled diabetic range)       Patient Active Problem List   Diagnosis Date Noted   Prediabetes 12/04/2020   Mixed hyperlipidemia 12/04/2020   Hypertension, benign 12/03/2019   Fibrocystic breast changes, bilateral 12/03/2019   Mild episode of recurrent major depressive disorder (HCC) 03/26/2019   Obstructive  sleep apnea 12/30/2018   Absolute anemia 01/09/2018   Vitamin B12 deficiency 01/08/2018   Vitamin D deficiency 01/08/2018   Positive ANA (antinuclear antibody) 12/15/2017   Primary osteoarthritis of right knee 05/13/2017   Overweight (BMI 25.0-29.9) 11/19/2016   White coat syndrome with hypertension 01/25/2016   Dysmenorrhea 01/03/2016   Allergic rhinitis, seasonal 01/10/2015   Asymptomatic varicose veins of both lower extremities 01/10/2015      Current Outpatient Medications:    amLODipine (NORVASC) 5 MG tablet, Take 1 tablet (5 mg total) by mouth daily., Disp: 30 tablet, Rfl: 3   busPIRone (BUSPAR) 5 MG tablet, Take 1-2 tablets (5-10 mg total) by mouth 2 (two) times daily as needed (for anxiety/stress symptoms)., Disp: 120 tablet, Rfl: 1   celecoxib (CELEBREX) 200 MG capsule, Take 200 mg by mouth 2 (two) times daily., Disp: , Rfl:    Cholecalciferol (D3 PO), Take by mouth., Disp: , Rfl:    estradiol-norethindrone (ACTIVELLA) 1-0.5 MG tablet, Take 1 tablet by mouth daily., Disp: 90 tablet, Rfl: 1   halcinonide (HALOG) 0.1 % external solution, , Disp: , Rfl:    tiZANidine (ZANAFLEX) 4 MG tablet, Take 4 mg by mouth every 6 (six)  hours., Disp: , Rfl:    vitamin B-12 (CYANOCOBALAMIN) 500 MCG tablet, Take 1 tablet (500 mcg total) by mouth daily., Disp: 90 tablet, Rfl: 3   celecoxib (CELEBREX) 100 MG capsule, Take by mouth as needed. (Patient not taking: Reported on 09/25/2021), Disp: , Rfl:    losartan (COZAAR) 100 MG tablet, Take 1 tablet (100 mg total) by mouth daily., Disp: 90 tablet, Rfl: 0   losartan (COZAAR) 100 MG tablet, losartan 100 mg tablet (Patient not taking: Reported on 09/25/2021), Disp: , Rfl:    methylPREDNISolone (MEDROL DOSEPAK) 4 MG TBPK tablet, Take by mouth as directed. (Patient not taking: Reported on 09/25/2021), Disp: , Rfl:    Allergies  Allergen Reactions   Contrave [Naltrexone-Bupropion Hcl Er]     Palpitations, hypertension     Social History   Tobacco  Use   Smoking status: Never   Smokeless tobacco: Never  Vaping Use   Vaping Use: Never used  Substance Use Topics   Alcohol use: Yes    Alcohol/week: 2.0 standard drinks    Types: 2 Glasses of wine per week    Comment: twice a week   Drug use: No      Chart Review Today: I personally reviewed active problem list, medication list, allergies, family history, social history, health maintenance, notes from last encounter, lab results, imaging with the patient/caregiver today.   Review of Systems  Constitutional: Negative.  Negative for activity change, appetite change and fever.  HENT: Negative.    Eyes: Negative.   Respiratory: Negative.  Negative for shortness of breath.   Cardiovascular: Negative.  Negative for chest pain.  Gastrointestinal: Negative.  Negative for abdominal pain.  Endocrine: Negative.   Genitourinary: Negative.   Musculoskeletal: Negative.   Skin: Negative.   Allergic/Immunologic: Negative.   Neurological: Negative.   Hematological: Negative.   Psychiatric/Behavioral: Negative.    All other systems reviewed and are negative.     Objective:   Vitals:   09/25/21 1047  BP: 118/76  Pulse: 69  Resp: 16  Temp: 98.3 F (36.8 C)  TempSrc: Oral  SpO2: 100%  Weight: 194 lb 12.8 oz (88.4 kg)  Height: 5\' 8"  (1.727 m)    Body mass index is 29.62 kg/m.  Physical Exam Vitals and nursing note reviewed.  Constitutional:      General: She is not in acute distress.    Appearance: Normal appearance. She is well-developed. She is not ill-appearing, toxic-appearing or diaphoretic.     Interventions: Face mask in place.  HENT:     Head: Normocephalic and atraumatic.     Right Ear: External ear normal.     Left Ear: External ear normal.  Eyes:     General: Lids are normal. No scleral icterus.       Right eye: No discharge.        Left eye: No discharge.     Conjunctiva/sclera: Conjunctivae normal.  Neck:     Trachea: Phonation normal. No tracheal  deviation.  Cardiovascular:     Rate and Rhythm: Normal rate and regular rhythm.     Pulses: Normal pulses.          Radial pulses are 2+ on the right side and 2+ on the left side.       Posterior tibial pulses are 2+ on the right side and 2+ on the left side.     Heart sounds: Normal heart sounds. No murmur heard.   No friction rub. No gallop.  Pulmonary:  Effort: Pulmonary effort is normal. No respiratory distress.     Breath sounds: Normal breath sounds. No stridor. No wheezing, rhonchi or rales.  Chest:     Chest wall: No tenderness.  Abdominal:     General: Bowel sounds are normal. There is no distension.     Palpations: Abdomen is soft.     Tenderness: There is no abdominal tenderness. There is no right CVA tenderness, left CVA tenderness or guarding.  Musculoskeletal:     Right lower leg: No edema.     Left lower leg: No edema.  Skin:    General: Skin is warm and dry.     Coloration: Skin is not jaundiced or pale.     Findings: No rash.  Neurological:     Mental Status: She is alert.     Motor: No abnormal muscle tone.     Gait: Gait normal.  Psychiatric:        Mood and Affect: Mood normal.        Speech: Speech normal.        Behavior: Behavior normal.     Results for orders placed or performed in visit on 09/25/21  POCT Glucose (CBG)  Result Value Ref Range   POC Glucose 120 (A) 70 - 99 mg/dl  POCT Urinalysis Dipstick  Result Value Ref Range   Color, UA Dark Yellow    Clarity, UA Clear    Glucose, UA Negative Negative   Bilirubin, UA Small    Ketones, UA Trace    Spec Grav, UA <=1.005 (A) 1.010 - 1.025   Blood, UA Negative    pH, UA 5.0 5.0 - 8.0   Protein, UA Positive (A) Negative   Urobilinogen, UA 0.2 0.2 or 1.0 E.U./dL   Nitrite, UA Negative    Leukocytes, UA Small (1+) (A) Negative   Appearance Cloudy    Odor Normal        Assessment & Plan:     ICD-10-CM   1. Shakiness  R25.1 POCT Glucose (CBG)    POCT Urinalysis Dipstick   glucometer  for monitoring and r/o hypoglycemia, no DM    2. Weakness  R53.1 POCT Glucose (CBG)    POCT Urinalysis Dipstick    POCT HgB A1C    3. Frequent urination  R35.0 POCT Urinalysis Dipstick    Urine Culture    POCT HgB A1C    Ambulatory referral to Urology   worsening frequency and incontinence - f/up with urology    4. Fatigue, unspecified type  R53.83 POCT HgB A1C    5. Nausea  R11.0 pantoprazole (PROTONIX) 20 MG tablet   PPI trial for 2-4 weeks, nausea, bloating and fullness with eating, no pain, reflux, vomiting, declined labs    6. Postprandial bloating  R14.0 pantoprazole (PROTONIX) 20 MG tablet   see above     Random glucose and A1C normal today, no DM - pt reassured  Trial of PPI, may be upper GI sx with postprandial N, bloating, excessive fullness - given FODMAP handout May want to f/up with GI if not improving       Danelle Berry, PA-C 09/25/21 11:06 AM

## 2021-09-25 NOTE — Patient Instructions (Addendum)
Results for orders placed or performed in visit on 09/25/21  POCT Glucose (CBG)  Result Value Ref Range   POC Glucose 120 (A) 70 - 99 mg/dl  POCT Urinalysis Dipstick  Result Value Ref Range   Color, UA Dark Yellow    Clarity, UA Clear    Glucose, UA Negative Negative   Bilirubin, UA Small    Ketones, UA Trace    Spec Grav, UA <=1.005 (A) 1.010 - 1.025   Blood, UA Negative    pH, UA 5.0 5.0 - 8.0   Protein, UA Positive (A) Negative   Urobilinogen, UA 0.2 0.2 or 1.0 E.U./dL   Nitrite, UA Negative    Leukocytes, UA Small (1+) (A) Negative   Appearance Cloudy    Odor Normal   POCT HgB A1C  Result Value Ref Range   Hemoglobin A1C 5.4 4.0 - 5.6 %   HbA1c POC (<> result, manual entry)     HbA1c, POC (prediabetic range)     HbA1c, POC (controlled diabetic range)      How is this diagnosed? This condition can be diagnosed with blood tests. Your blood glucose may be checked with one or more of the following tests: A fasting blood glucose (FBG) test. You will not be allowed to eat (you will fast) for at least 8 hours before a blood sample is taken. An A1C blood test (hemoglobin A1C). This test provides information about blood glucose levels over the previous 2?3 months. An oral glucose tolerance test (OGTT). This test measures your blood glucose at two points in time: After fasting. This is your baseline level. Two hours after you drink a beverage that contains glucose. You may be diagnosed with prediabetes if: Your FBG is 100?125 mg/dL (5.6-6.9 mmol/L). Your A1C level is 5.7?6.4% (39-46 mmol/mol). Your OGTT result is 140?199 mg/dL (7.8-11 mmol/L). These bloo Low-FODMAP Eating Plan  FODMAP stands for fermentable oligosaccharides, disaccharides, monosaccharides, and polyols. These are sugars that are hard for some people to digest. A low-FODMAP eating plan may help some people who have irritable bowel syndrome (IBS) and certain other bowel (intestinal) diseases to manage their  symptoms. This meal plan can be complicated to follow. Work with a diet and nutrition specialist (dietitian) to make a low-FODMAP eating plan that is right for you. A dietitian can help make sure that you get enough nutrition from this diet. What are tips for following this plan? Reading food labels Check labels for hidden FODMAPs such as: High-fructose syrup. Honey. Agave. Natural fruit flavors. Onion or garlic powder. Choose low-FODMAP foods that contain 3-4 grams of fiber per serving. Check food labels for serving sizes. Eat only one serving at a time to make sure FODMAP levels stay low. Shopping Shop with a list of foods that are recommended on this diet and make a meal plan. Meal planning Follow a low-FODMAP eating plan for up to 6 weeks, or as told by your health care provider or dietitian. To follow the eating plan: Eliminate high-FODMAP foods from your diet completely. Choose only low-FODMAP foods to eat. You will do this for 2-6 weeks. Gradually reintroduce high-FODMAP foods into your diet one at a time. Most people should wait a few days before introducing the next new high-FODMAP food into their meal plan. Your dietitian can recommend how quickly you may reintroduce foods. Keep a daily record of what and how much you eat and drink. Make note of any symptoms that you have after eating. Review your daily record with a  dietitian regularly to identify which foods you can eat and which foods you should avoid. General tips Drink enough fluid each day to keep your urine pale yellow. Avoid processed foods. These often have added sugar and may be high in FODMAPs. Avoid most dairy products, whole grains, and sweeteners. Work with a dietitian to make sure you get enough fiber in your diet. Avoid high FODMAP foods at meals to manage symptoms. Recommended foods Fruits Bananas, oranges, tangerines, lemons, limes, blueberries, raspberries, strawberries, grapes, cantaloupe, honeydew melon,  kiwi, papaya, passion fruit, and pineapple. Limited amounts of dried cranberries, banana chips, and shredded coconut. Vegetables Eggplant, zucchini, cucumber, peppers, green beans, bean sprouts, lettuce, arugula, kale, Swiss chard, spinach, collard greens, bok choy, summer squash, potato, and tomato. Limited amounts of corn, carrot, and sweet potato. Green parts of scallions. Grains Gluten-free grains, such as rice, oats, buckwheat, quinoa, corn, polenta, and millet. Gluten-free pasta, bread, or cereal. Rice noodles. Corn tortillas. Meats and other proteins Unseasoned beef, pork, poultry, or fish. Eggs. Berniece Salines. Tofu (firm) and tempeh. Limited amounts of nuts and seeds, such as almonds, walnuts, [place] nuts, pecans, peanuts, nut butters, pumpkin seeds, chia seeds, and sunflower seeds. Dairy Lactose-free milk, yogurt, and kefir. Lactose-free cottage cheese and ice cream. Non-dairy milks, such as almond, coconut, hemp, and rice milk. Non-dairy yogurt. Limited amounts of goat cheese, brie, mozzarella, parmesan, swiss, and other hard cheeses. Fats and oils Butter-free spreads. Vegetable oils, such as olive, canola, and sunflower oil. Seasoning and other foods Artificial sweeteners with names that do not end in "ol," such as aspartame, saccharine, and stevia. Maple syrup, white table sugar, raw sugar, brown sugar, and molasses. Mayonnaise, soy sauce, and tamari. Fresh basil, coriander, parsley, rosemary, and thyme. Beverages Water and mineral water. Sugar-sweetened soft drinks. Small amounts of orange juice or cranberry juice. Black and green tea. Most dry wines. Coffee. The items listed above may not be a complete list of foods and beverages you can eat. Contact a dietitian for more information. Foods to avoid Fruits Fresh, dried, and juiced forms of apple, pear, watermelon, peach, plum, cherries, apricots, blackberries, boysenberries, figs, nectarines, and mango. Avocado. Vegetables Chicory root,  artichoke, asparagus, cabbage, snow peas, Brussels sprouts, broccoli, sugar snap peas, mushrooms, celery, and cauliflower. Onions, garlic, leeks, and the white part of scallions. Grains Wheat, including kamut, durum, and semolina. Barley and bulgur. Couscous. Wheat-based cereals. Wheat noodles, bread, crackers, and pastries. Meats and other proteins Fried or fatty meat. Sausage. Cashews and pistachios. Soybeans, baked beans, black beans, chickpeas, kidney beans, fava beans, navy beans, lentils, black-eyed peas, and split peas. Dairy Milk, yogurt, ice cream, and soft cheese. Cream and sour cream. Milk-based sauces. Custard. Buttermilk. Soy milk. Seasoning and other foods Any sugar-free gum or candy. Foods that contain artificial sweeteners such as sorbitol, mannitol, isomalt, or xylitol. Foods that contain honey, high-fructose corn syrup, or agave. Bouillon, vegetable stock, beef stock, and chicken stock. Garlic and onion powder. Condiments made with onion, such as hummus, chutney, pickles, relish, salad dressing, and salsa. Tomato paste. Beverages Chicory-based drinks. Coffee substitutes. Chamomile tea. Fennel tea. Sweet or fortified wines such as port or sherry. Diet soft drinks made with isomalt, mannitol, maltitol, sorbitol, or xylitol. Apple, pear, and mango juice. Juices with high-fructose corn syrup. The items listed above may not be a complete list of foods and beverages you should avoid. Contact a dietitian for more information. Summary FODMAP stands for fermentable oligosaccharides, disaccharides, monosaccharides, and polyols. These are sugars that are hard for some  people to digest. A low-FODMAP eating plan is a short-term diet that helps to ease symptoms of certain bowel diseases. The eating plan usually lasts up to 6 weeks. After that, high-FODMAP foods are reintroduced gradually and one at a time. This can help you find out which foods may be causing symptoms. A low-FODMAP eating plan  can be complicated. It is best to work with a dietitian who has experience with this type of plan. This information is not intended to replace advice given to you by your health care provider. Make sure you discuss any questions you have with your health care provider. Document Revised: 09/09/2019 Document Reviewed: 09/09/2019 Else[employer] Patient Education  [employer].

## 2021-09-26 LAB — URINE CULTURE
MICRO NUMBER:: 13434869
Result:: NO GROWTH
SPECIMEN QUALITY:: ADEQUATE

## 2021-09-27 ENCOUNTER — Encounter: Payer: Self-pay | Admitting: *Deleted

## 2021-10-25 ENCOUNTER — Ambulatory Visit
Admission: RE | Admit: 2021-10-25 | Discharge: 2021-10-25 | Disposition: A | Discharge status: Home / Self Care | Payer: BC Managed Care – PPO | Source: Ambulatory Visit | Attending: Family Medicine | Admitting: Family Medicine

## 2021-10-25 DIAGNOSIS — Z1231 Encounter for screening mammogram for malignant neoplasm of breast: Secondary | ICD-10-CM | POA: Diagnosis not present

## 2021-10-30 DIAGNOSIS — M5416 Radiculopathy, lumbar region: Secondary | ICD-10-CM | POA: Diagnosis not present

## 2021-11-05 DIAGNOSIS — M5416 Radiculopathy, lumbar region: Secondary | ICD-10-CM | POA: Diagnosis not present

## 2021-11-16 ENCOUNTER — Encounter: Payer: Self-pay | Admitting: Family Medicine

## 2021-11-24 DIAGNOSIS — U071 COVID-19: Secondary | ICD-10-CM | POA: Diagnosis not present

## 2021-11-24 DIAGNOSIS — J019 Acute sinusitis, unspecified: Secondary | ICD-10-CM | POA: Diagnosis not present

## 2021-11-24 DIAGNOSIS — B9689 Other specified bacterial agents as the cause of diseases classified elsewhere: Secondary | ICD-10-CM | POA: Diagnosis not present

## 2021-11-24 DIAGNOSIS — Z20822 Contact with and (suspected) exposure to covid-19: Secondary | ICD-10-CM | POA: Diagnosis not present

## 2021-11-28 DIAGNOSIS — M48061 Spinal stenosis, lumbar region without neurogenic claudication: Secondary | ICD-10-CM | POA: Insufficient documentation

## 2021-11-30 ENCOUNTER — Ambulatory Visit: Payer: BC Managed Care – PPO | Admitting: Family Medicine

## 2021-12-10 ENCOUNTER — Ambulatory Visit (INDEPENDENT_AMBULATORY_CARE_PROVIDER_SITE_OTHER): Payer: BC Managed Care – PPO | Admitting: Family Medicine

## 2021-12-10 ENCOUNTER — Encounter: Payer: Self-pay | Admitting: Family Medicine

## 2021-12-10 ENCOUNTER — Encounter: Payer: BC Managed Care – PPO | Admitting: Family Medicine

## 2021-12-10 VITALS — BP 122/76 | HR 64 | Temp 98.4°F | Resp 16 | Ht 68.0 in | Wt 198.4 lb

## 2021-12-10 DIAGNOSIS — Z0000 Encounter for general adult medical examination without abnormal findings: Secondary | ICD-10-CM | POA: Diagnosis not present

## 2021-12-10 DIAGNOSIS — Z131 Encounter for screening for diabetes mellitus: Secondary | ICD-10-CM

## 2021-12-10 DIAGNOSIS — E785 Hyperlipidemia, unspecified: Secondary | ICD-10-CM | POA: Diagnosis not present

## 2021-12-10 DIAGNOSIS — Z79899 Other long term (current) drug therapy: Secondary | ICD-10-CM

## 2021-12-10 DIAGNOSIS — E782 Mixed hyperlipidemia: Secondary | ICD-10-CM | POA: Diagnosis not present

## 2021-12-10 DIAGNOSIS — R7989 Other specified abnormal findings of blood chemistry: Secondary | ICD-10-CM

## 2021-12-10 DIAGNOSIS — E559 Vitamin D deficiency, unspecified: Secondary | ICD-10-CM | POA: Diagnosis not present

## 2021-12-10 DIAGNOSIS — Z862 Personal history of diseases of the blood and blood-forming organs and certain disorders involving the immune mechanism: Secondary | ICD-10-CM

## 2021-12-10 DIAGNOSIS — E538 Deficiency of other specified B group vitamins: Secondary | ICD-10-CM

## 2021-12-10 NOTE — Progress Notes (Unsigned)
Name: Andrea Pope   MRN: [id number]    DOB: [birth year]   Date:12/10/2021       Progress Note  Subjective  Chief Complaint  Chief Complaint  Patient presents with   Annual Exam    And discuss weight loss med    HPI  Patient presents for annual CPE.  Diet: tries to eat at home, usually balanced  Exercise: not currently due to back pain, discussed water activity    [place] Office Visit from 09/01/2020 in [facility]  AUDIT-C Score 0      Depression: Phq 9 is  negative    12/10/2021    1:50 PM 09/25/2021   10:55 AM 08/01/2021    3:35 PM 12/04/2020    9:56 AM 09/01/2020    2:03 PM  Depression screen PHQ 2/9  Decreased Interest 0 0 0 0 0  Down, Depressed, Hopeless 0 0 0 0 0  PHQ - 2 Score 0 0 0 0 0  Altered sleeping 0 0 0 0 0  Tired, decreased energy 0 2 0 0 0  Change in appetite 0 0 0 0 0  Feeling bad or failure about yourself  0 0 0 0 0  Trouble concentrating 0 0 0 0 0  Moving slowly or fidgety/restless 0 0 0 0 0  Suicidal thoughts 0 0 0 0 0  PHQ-9 Score 0 2 0 0 0  Difficult doing work/chores Not difficult at all Not difficult at all Not difficult at all Not difficult at all Not difficult at all   Hypertension: BP Readings from Last 3 Encounters:  09/25/21 118/76  06/08/21 137/83  03/23/21 118/70   Obesity: Wt Readings from Last 3 Encounters:  12/10/21 198 lb 6.4 oz (90 kg)  09/25/21 194 lb 12.8 oz (88.4 kg)  06/08/21 187 lb (84.8 kg)   BMI Readings from Last 3 Encounters:  12/10/21 30.17 kg/m  09/25/21 29.62 kg/m  06/08/21 28.43 kg/m     Vaccines:    Tdap: up to date  Shingrix: N/A Pneumonia: N/A Flu: yearly  COVID-19:discussed bivalent vaccine    Hep C Screening:  STD testing and prevention (HIV/chl/gon/syphilis): Not interested  Intimate partner violence: negative screen  Sexual History :married, no pain  Menstrual History/LMP/Abnormal Bleeding: no cycles, recent ablation due to heavy bleeding and dysmenorrhea   Discussed importance of follow up if any post-menopausal bleeding: not applicable  Incontinence Symptoms: negative for symptoms   Breast cancer:  - Last Mammogram: 10/2021 - BRCA gene screening: N/A  Osteoporosis Prevention : Discussed high calcium and vitamin D supplementation, weight bearing exercises Bone density :not applicable   Cervical cancer screening: repeat with gyn next year   Skin cancer: Discussed monitoring for atypical lesions  Colorectal cancer: up to date done 2022    Lung cancer:  Low Dose CT Chest recommended if Age 41-80 years, 20 pack-year currently smoking OR have quit w/in 15years. Patient does not qualify for screen   ECG: 12/2020   Advanced Care Planning: A voluntary discussion about advance care planning including the explanation and discussion of advance directives.  Discussed health care proxy and Living will, and the patient was able to identify a health care proxy as husband .  Patient does not have a living will and power of attorney of health care   Lipids: Lab Results  Component Value Date   CHOL 216 (H) 12/04/2020   CHOL 249 (H) 12/23/2019   CHOL 188 12/30/2018   Lab Results  Component Value Date   HDL 58 12/04/2020   HDL 58 12/23/2019   HDL 61 12/30/2018   Lab Results  Component Value Date   LDLCALC 138 (H) 12/04/2020   LDLCALC 169 (H) 12/23/2019   LDLCALC 103 (H) 12/30/2018   Lab Results  Component Value Date   TRIG 94 12/04/2020   TRIG 101 12/23/2019   TRIG 144 12/30/2018   Lab Results  Component Value Date   CHOLHDL 3.7 12/04/2020   CHOLHDL 4.3 12/23/2019   CHOLHDL 3.1 12/30/2018   No results found for: "LDLDIRECT"  Glucose: Glucose, Bld  Date Value Ref Range Status  12/04/2020 83 65 - 99 mg/dL Final    Comment:    .            Fasting reference interval .   07/26/2020 81 65 - 99 mg/dL Final    Comment:    .            Fasting reference interval .   12/23/2019 85 65 - 139 mg/dL Final    Comment:    .         Non-fasting reference interval .     Patient Active Problem List   Diagnosis Date Noted   Spinal stenosis of lumbar region 11/28/2021   Prediabetes 12/04/2020   Mixed hyperlipidemia 12/04/2020   Hypertension, benign 12/03/2019   Fibrocystic breast changes, bilateral 12/03/2019   Mild episode of recurrent major depressive disorder ([place]) 03/26/2019   Obstructive sleep apnea 12/30/2018   Vitamin B12 deficiency 01/08/2018   Vitamin D deficiency 01/08/2018   Positive ANA (antinuclear antibody) 12/15/2017   Primary osteoarthritis of right knee 05/13/2017   Overweight (BMI 25.0-29.9) 11/19/2016   White coat syndrome with hypertension 01/25/2016   Allergic rhinitis, seasonal 01/10/2015   Asymptomatic varicose veins of both lower extremities 01/10/2015    Past Surgical History:  Procedure Laterality Date   CHOLECYSTECTOMY N/A 01/19/2016   Procedure: LAPAROSCOPIC CHOLECYSTECTOMY WITH INTRAOPERATIVE CHOLANGIOGRAM;  Surgeon: Christene Lye, MD;  Location: [facility] ORS;  Service: General;  Laterality: N/A;   [place] N/A 03/24/2017   Procedure: DILATATION & CURETTAGE/HYSTEROSCOPY WITH NOVASURE ABLATION;  Surgeon: Brayton Mars, MD;  Location: [facility] ORS;  Service: Gynecology;  Laterality: N/A;   laser surgery on eye Right 10/2016    Family History  Problem Relation Age of Onset   Hypertension Mother    Thyroid disease Mother    Kidney disease Mother    Hypertension Father    Cancer Father        Prostate   Diabetes Brother    Diabetes Other    Cancer Paternal Uncle        Prostate   Diabetes Maternal Grandmother    Cancer Paternal Grandfather        Prostate   Diabetes Sister    Schizophrenia Sister    Breast cancer Neg Hx    Ovarian cancer Neg Hx    Heart disease Neg Hx     Social History   Socioeconomic History   Marital status: Married    Spouse name: Ron    Number of children: 4   Years of education: Not on  file   Highest education level: Bachelor's degree (e.g., BA, AB, BS)  Occupational History   Occupation: Teacher  Tobacco Use   Smoking status: Never   Smokeless tobacco: Never  Vaping Use   Vaping Use: Never used  Substance and Sexual Activity   Alcohol use:  Yes    Alcohol/week: 2.0 standard drinks of alcohol    Types: 2 Glasses of wine per week    Comment: twice a week   Drug use: No   Sexual activity: Yes    Partners: Male    Birth control/protection: None  Other Topics Concern   Not on file  Social History Narrative   Not on file   Social Determinants of Health   Financial Resource Strain: Low Risk  (12/04/2020)   Overall Financial Resource Strain (CARDIA)    Difficulty of Paying Living Expenses: Not hard at all  Food Insecurity: No Food Insecurity (12/10/2021)   Hunger Vital Sign    Worried About Running Out of Food in the Last Year: Never true    [place] in the Last Year: Never true  Transportation Needs: No Transportation Needs (12/10/2021)   PRAPARE - Hydrologist (Medical): No    Lack of Transportation (Non-Medical): No  Physical Activity: Insufficiently Active (12/10/2021)   Exercise Vital Sign    Days of Exercise per Week: 3 days    Minutes of Exercise per Session: 40 min  Stress: No Stress Concern Present (12/10/2021)   [place]    Feeling of Stress : Only a little  Social Connections: Moderately Isolated (12/10/2021)   Social Connection and Isolation Panel [NHANES]    Frequency of Communication with Friends and Family: Three times a week    Frequency of Social Gatherings with Friends and Family: Three times a week    Attends Religious Services: Never    Active Member of Clubs or Organizations: No    Attends Archivist Meetings: Never    Marital Status: Married  Human resources officer Violence: Not At Risk (12/10/2021)   Humiliation, Afraid, Rape, and Kick  questionnaire    Fear of Current or Ex-Partner: No    Emotionally Abused: No    Physically Abused: No    Sexually Abused: No     Current Outpatient Medications:    celecoxib (CELEBREX) 200 MG capsule, Take 200 mg by mouth 2 (two) times daily., Disp: , Rfl:    Cholecalciferol (D3 PO), Take by mouth., Disp: , Rfl:    estradiol-norethindrone (ACTIVELLA) 1-0.5 MG tablet, Take 1 tablet by mouth daily., Disp: , Rfl:    losartan (COZAAR) 100 MG tablet, , Disp: , Rfl:    amLODipine (NORVASC) 5 MG tablet, Take 1 tablet (5 mg total) by mouth daily., Disp: 30 tablet, Rfl: 3   tiZANidine (ZANAFLEX) 4 MG tablet, Take 4 mg by mouth every 6 (six) hours., Disp: , Rfl:   Allergies  Allergen Reactions   Contrave [Naltrexone-Bupropion Hcl Er]     Palpitations, hypertension     ROS  Constitutional: Negative for fever or weight change.  Respiratory: Negative for cough and shortness of breath.   Cardiovascular: Negative for chest pain or palpitations.  Gastrointestinal: Negative for abdominal pain, no bowel changes.  Musculoskeletal: Negative for gait problem or joint swelling.  Skin: Negative for rash.  Neurological: Negative for dizziness or headache.  No other specific complaints in a complete review of systems (except as listed in HPI above).   Objective  Vitals:   12/10/21 1348  Pulse: 64  Resp: 16  Temp: 98.4 F (36.9 C)  SpO2: 99%  Weight: 198 lb 6.4 oz (90 kg)  Height: 5' 8"  (1.727 m)    Body mass index is 30.17 kg/m.  Physical  Exam  Constitutional: Patient appears well-developed and well-nourished. No distress.  HENT: Head: Normocephalic and atraumatic. Ears: B TMs ok, no erythema or effusion; Nose: Nose normal. Mouth/Throat: Oropharynx is clear and moist. No oropharyngeal exudate.  Eyes: Conjunctivae and EOM are normal. Pupils are equal, round, and reactive to light. No scleral icterus.  Neck: Normal range of motion. Neck supple. No JVD present. No thyromegaly present.   Cardiovascular: Normal rate, regular rhythm and normal heart sounds.  No murmur heard. No BLE edema. Pulmonary/Chest: Effort normal and breath sounds normal. No respiratory distress. Abdominal: Soft. Bowel sounds are normal, no distension. There is no tenderness. no masses Breast: no lumps or masses,not done  FEMALE GENITALIA:  Not done  RECTAL: not done  Musculoskeletal: Normal range of motion, no joint effusions. No gross deformities Neurological: he is alert and oriented to person, place, and time. No cranial nerve deficit. Coordination, balance, strength, speech and gait are normal.  Skin: Skin is warm and dry. No rash noted. No erythema.  Psychiatric: Patient has a normal mood and affect. behavior is normal. Judgment and thought content normal.   Recent Results (from the past 2160 hour(s))  POCT Glucose (CBG)     Status: Abnormal   Collection Time: 09/25/21 10:58 AM  Result Value Ref Range   POC Glucose 120 (A) 70 - 99 mg/dl    Comment: 1 hour after eating  POCT Urinalysis Dipstick     Status: Abnormal   Collection Time: 09/25/21 11:01 AM  Result Value Ref Range   Color, UA Dark Yellow    Clarity, UA Clear    Glucose, UA Negative Negative   Bilirubin, UA Small    Ketones, UA Trace    Spec Grav, UA <=1.005 (A) 1.010 - 1.025   Blood, UA Negative    pH, UA 5.0 5.0 - 8.0   Protein, UA Positive (A) Negative    Comment: Trace   Urobilinogen, UA 0.2 0.2 or 1.0 E.U./dL   Nitrite, UA Negative    Leukocytes, UA Small (1+) (A) Negative   Appearance Cloudy    Odor Normal   POCT HgB A1C     Status: Normal   Collection Time: 09/25/21 11:07 AM  Result Value Ref Range   Hemoglobin A1C 5.4 4.0 - 5.6 %   HbA1c POC (<> result, manual entry)     HbA1c, POC (prediabetic range)     HbA1c, POC (controlled diabetic range)    Urine Culture     Status: None   Collection Time: 09/25/21 11:36 AM   Specimen: Urine  Result Value Ref Range   MICRO NUMBER: [id number]    SPECIMEN QUALITY:  Adequate    Sample Source URINE    STATUS: FINAL    Result: No Growth      Fall Risk:    12/10/2021    1:50 PM 09/25/2021   10:55 AM 08/01/2021    3:35 PM 12/04/2020    9:56 AM 09/01/2020    2:03 PM  Fall Risk   Falls in the past year? 0 0 0 0 0  Number falls in past yr: 0  0 0 0  Injury with Fall? 0  0 0 0  Risk for fall due to :  No Fall Risks No Fall Risks    Follow up  Falls prevention discussed Falls prevention discussed  Falls evaluation completed     Functional Status Survey: Is the patient deaf or have difficulty hearing?: No Does the patient have difficulty seeing, even  when wearing glasses/contacts?: No Does the patient have difficulty concentrating, remembering, or making decisions?: No Does the patient have difficulty walking or climbing stairs?: No Does the patient have difficulty dressing or bathing?: No Does the patient have difficulty doing errands alone such as visiting a doctor's office or shopping?: No   Assessment & Plan  1. Well adult exam  - Lipid panel - CBC with Differential/Platelet - COMPLETE METABOLIC PANEL WITH GFR - Hemoglobin A1c - VITAMIN D 25 Hydroxy (Vit-D Deficiency, Fractures) - Vitamin B12 - TSH  2. Vitamin B12 deficiency  - Vitamin B12  3. Vitamin D deficiency  - VITAMIN D 25 Hydroxy (Vit-D Deficiency, Fractures)  4. Long-term use of high-risk medication  - COMPLETE METABOLIC PANEL WITH GFR  5. Dyslipidemia  - Lipid panel  6. Abnormal TSH  - TSH   7. History of anemia  - CBC with Differential/Platelet   8. Diabetes mellitus screening  - Hemoglobin A1c   -USPSTF grade A and B recommendations reviewed with patient; age-appropriate recommendations, preventive care, screening tests, etc discussed and encouraged; healthy living encouraged; see AVS for patient education given to patient -Discussed importance of 150 minutes of physical activity weekly, eat two servings of fish weekly, eat one serving of tree nuts (  cashews, pistachios, pecans, almonds.Marland Kitchen) every other day, eat 6 servings of fruit/vegetables daily and drink plenty of water and avoid sweet beverages.   -Reviewed Health Maintenance: Yes.

## 2021-12-11 LAB — CBC WITH DIFFERENTIAL/PLATELET
Absolute Monocytes: 478 cells/uL (ref 200–950)
Basophils Absolute: 21 cells/uL (ref 0–200)
Basophils Relative: 0.4 %
Eosinophils Absolute: 250 cells/uL (ref 15–500)
Eosinophils Relative: 4.8 %
HCT: 39 % (ref 35.0–45.0)
Hemoglobin: 13.2 g/dL (ref 11.7–15.5)
Lymphs Abs: 2174 cells/uL (ref 850–3900)
MCH: 31.3 pg (ref 27.0–33.0)
MCHC: 33.8 g/dL (ref 32.0–36.0)
MCV: 92.4 fL (ref 80.0–100.0)
MPV: 11.1 fL (ref 7.5–12.5)
Monocytes Relative: 9.2 %
Neutro Abs: 2278 cells/uL (ref 1500–7800)
Neutrophils Relative %: 43.8 %
Platelets: 321 10*3/uL (ref 140–400)
RBC: 4.22 10*6/uL (ref 3.80–5.10)
RDW: 12.8 % (ref 11.0–15.0)
Total Lymphocyte: 41.8 %
WBC: 5.2 10*3/uL (ref 3.8–10.8)

## 2021-12-11 LAB — VITAMIN B12: Vitamin B-12: 409 pg/mL (ref 200–1100)

## 2021-12-11 LAB — COMPLETE METABOLIC PANEL WITH GFR
AG Ratio: 1.4 (calc) (ref 1.0–2.5)
ALT: 21 U/L (ref 6–29)
AST: 14 U/L (ref 10–35)
Albumin: 4.2 g/dL (ref 3.6–5.1)
Alkaline phosphatase (APISO): 67 U/L (ref 31–125)
BUN: 11 mg/dL (ref 7–25)
CO2: 26 mmol/L (ref 20–32)
Calcium: 9.5 mg/dL (ref 8.6–10.2)
Chloride: 104 mmol/L (ref 98–110)
Creat: 0.86 mg/dL (ref 0.50–0.99)
Globulin: 2.9 g/dL (calc) (ref 1.9–3.7)
Glucose, Bld: 82 mg/dL (ref 65–99)
Potassium: 4.1 mmol/L (ref 3.5–5.3)
Sodium: 139 mmol/L (ref 135–146)
Total Bilirubin: 0.5 mg/dL (ref 0.2–1.2)
Total Protein: 7.1 g/dL (ref 6.1–8.1)
eGFR: 83 mL/min/{1.73_m2} (ref >=60)

## 2021-12-11 LAB — HEMOGLOBIN A1C
Hgb A1c MFr Bld: 5.8 % of total Hgb — ABNORMAL HIGH (ref <5.7)
Mean Plasma Glucose: 120 mg/dL
eAG (mmol/L): 6.6 mmol/L

## 2021-12-11 LAB — LIPID PANEL
Cholesterol: 239 mg/dL — ABNORMAL HIGH (ref <200)
HDL: 68 mg/dL (ref >=50)
LDL Cholesterol (Calc): 152 mg/dL (calc) — ABNORMAL HIGH
Non-HDL Cholesterol (Calc): 171 mg/dL (calc) — ABNORMAL HIGH (ref <130)
Total CHOL/HDL Ratio: 3.5 (calc) (ref <5.0)
Triglycerides: 89 mg/dL (ref <150)

## 2021-12-11 LAB — VITAMIN D 25 HYDROXY (VIT D DEFICIENCY, FRACTURES): Vit D, 25-Hydroxy: 25 ng/mL — ABNORMAL LOW (ref 30–100)

## 2021-12-11 LAB — TSH: TSH: 0.68 mIU/L

## 2021-12-13 ENCOUNTER — Ambulatory Visit: Payer: BC Managed Care – PPO | Admitting: Family Medicine

## 2021-12-13 ENCOUNTER — Encounter: Payer: Self-pay | Admitting: Cardiology

## 2021-12-13 ENCOUNTER — Encounter: Payer: Self-pay | Admitting: Family Medicine

## 2021-12-13 VITALS — BP 126/74 | HR 69 | Temp 98.4°F | Resp 16 | Ht 68.0 in | Wt 196.0 lb

## 2021-12-13 DIAGNOSIS — E663 Overweight: Secondary | ICD-10-CM

## 2021-12-13 DIAGNOSIS — M1711 Unilateral primary osteoarthritis, right knee: Secondary | ICD-10-CM

## 2021-12-13 DIAGNOSIS — G4733 Obstructive sleep apnea (adult) (pediatric): Secondary | ICD-10-CM

## 2021-12-13 DIAGNOSIS — Z7689 Persons encountering health services in other specified circumstances: Secondary | ICD-10-CM

## 2021-12-13 DIAGNOSIS — I10 Essential (primary) hypertension: Secondary | ICD-10-CM | POA: Diagnosis not present

## 2021-12-13 DIAGNOSIS — R7303 Prediabetes: Secondary | ICD-10-CM | POA: Diagnosis not present

## 2021-12-13 DIAGNOSIS — E785 Hyperlipidemia, unspecified: Secondary | ICD-10-CM

## 2021-12-13 MED ORDER — PHENTERMINE HCL 37.5 MG PO TABS: ORAL_TABLET | ORAL | 0 refills | Status: DC

## 2021-12-13 NOTE — Progress Notes (Signed)
Name: Andrea Pope   MRN: [id number]    DOB: Aug 05, 1970   Date:12/13/2021       Progress Note  Chief Complaint  Patient presents with   Follow-up   Hyperlipidemia   Hypertension   PreDM     Subjective:   Andrea Pope is a 51 y.o. female, presents to clinic for f/up on labs  Concerned about weight, she asks for phentermine, has gotten it cleared with cardiology  The 10-year ASCVD risk score (Arnett DK, et al., 2019) is: 2.6%   Values used to calculate the score:     Age: 22 years     Sex: Female     Is Non-Hispanic African American: Yes     Diabetic: No     Tobacco smoker: No     Systolic Blood Pressure: 126 mmHg     Is BP treated: Yes     HDL Cholesterol: 68 mg/dL     Total Cholesterol: 239 mg/dL     Denies: Polyuria, polydipsia, vision changes, neuropathy, hypoglycemia Recent pertinent labs: Lab Results  Component Value Date   HGBA1C 5.8 (H) 12/10/2021   HGBA1C 5.4 09/25/2021   HGBA1C 5.5 12/04/2020        Current Outpatient Medications:    celecoxib (CELEBREX) 200 MG capsule, Take 200 mg by mouth 2 (two) times daily., Disp: , Rfl:    Cholecalciferol (D3 PO), Take by mouth., Disp: , Rfl:    estradiol-norethindrone (ACTIVELLA) 1-0.5 MG tablet, Take 1 tablet by mouth daily., Disp: , Rfl:    losartan (COZAAR) 100 MG tablet, , Disp: , Rfl:    tiZANidine (ZANAFLEX) 4 MG tablet, Take 4 mg by mouth every 6 (six) hours., Disp: , Rfl:    amLODipine (NORVASC) 5 MG tablet, Take 1 tablet (5 mg total) by mouth daily., Disp: 30 tablet, Rfl: 3  Patient Active Problem List   Diagnosis Date Noted   Spinal stenosis of lumbar region 11/28/2021   Prediabetes 12/04/2020   Mixed hyperlipidemia 12/04/2020   Hypertension, benign 12/03/2019   Fibrocystic breast changes, bilateral 12/03/2019   Mild episode of recurrent major depressive disorder ([facility]) 03/26/2019   Obstructive sleep apnea 12/30/2018   Vitamin B12 deficiency 01/08/2018   Vitamin D deficiency  01/08/2018   Positive ANA (antinuclear antibody) 12/15/2017   Primary osteoarthritis of right knee 05/13/2017   Overweight (BMI 25.0-29.9) 11/19/2016   White coat syndrome with hypertension 01/25/2016   Allergic rhinitis, seasonal 01/10/2015   Asymptomatic varicose veins of both lower extremities 01/10/2015    Past Surgical History:  Procedure Laterality Date   CHOLECYSTECTOMY N/A 01/19/2016   Procedure: LAPAROSCOPIC CHOLECYSTECTOMY WITH INTRAOPERATIVE CHOLANGIOGRAM;  Surgeon: Kieth Brightly, MD;  Location: [facility] ORS;  Service: General;  Laterality: N/A;   DILITATION & CURRETTAGE/HYSTROSCOPY WITH NOVASURE ABLATION N/A 03/24/2017   Procedure: DILATATION & CURETTAGE/HYSTEROSCOPY WITH NOVASURE ABLATION;  Surgeon: Herold Harms, MD;  Location: [facility] ORS;  Service: Gynecology;  Laterality: N/A;   laser surgery on eye Right 10/2016    Family History  Problem Relation Age of Onset   Hypertension Mother    Thyroid disease Mother    Kidney disease Mother    Hypertension Father    Cancer Father        Prostate   Diabetes Brother    Diabetes Other    Cancer Paternal Uncle        Prostate   Diabetes Maternal Grandmother    Cancer Paternal Grandfather  Prostate   Diabetes Sister    Schizophrenia Sister    Breast cancer Neg Hx    Ovarian cancer Neg Hx    Heart disease Neg Hx     Social History   Tobacco Use   Smoking status: Never   Smokeless tobacco: Never  Vaping Use   Vaping Use: Never used  Substance Use Topics   Alcohol use: Yes    Alcohol/week: 2.0 standard drinks of alcohol    Types: 2 Glasses of wine per week    Comment: twice a week   Drug use: No     Allergies  Allergen Reactions   Contrave [Naltrexone-Bupropion Hcl Er]     Palpitations, hypertension    Health Maintenance  Topic Date Due   COVID-19 Vaccine (5 - Mixed Product risk series) 08/06/2020   INFLUENZA VACCINE  12/04/2021   PAP SMEAR-Modifier  12/08/2022   TETANUS/TDAP   12/06/2027   COLONOSCOPY (Pts 45-42yrs Insurance coverage will need to be confirmed)  09/26/2030   Hepatitis C Screening  Completed   HIV Screening  Completed   HPV VACCINES  Aged Out    Chart Review Today: I personally reviewed active problem list, medication list, allergies, family history, social history, health maintenance, notes from last encounter, lab results, imaging with the patient/caregiver today.   Review of Systems  Constitutional: Negative.   HENT: Negative.    Eyes: Negative.   Respiratory: Negative.    Cardiovascular: Negative.   Gastrointestinal: Negative.   Endocrine: Negative.   Genitourinary: Negative.   Musculoskeletal: Negative.   Skin: Negative.   Allergic/Immunologic: Negative.   Neurological: Negative.   Hematological: Negative.   Psychiatric/Behavioral: Negative.    All other systems reviewed and are negative.    Objective:   Vitals:   12/13/21 1134  BP: 126/74  Pulse: 69  Resp: 16  Temp: 98.4 F (36.9 C)  TempSrc: Oral  SpO2: 96%  Weight: 196 lb (88.9 kg)  Height: 5\' 8"  (1.727 m)    Body mass index is 29.8 kg/m.  Physical Exam Vitals and nursing note reviewed.  Constitutional:      General: She is not in acute distress.    Appearance: Normal appearance. She is well-developed and overweight. She is not ill-appearing, toxic-appearing or diaphoretic.  HENT:     Head: Normocephalic and atraumatic.     Right Ear: External ear normal.     Left Ear: External ear normal.     Nose: Nose normal.     Mouth/Throat:     Pharynx: No oropharyngeal exudate.  Eyes:     General: No scleral icterus.       Right eye: No discharge.        Left eye: No discharge.     Conjunctiva/sclera: Conjunctivae normal.     Pupils: Pupils are equal, round, and reactive to light.  Neck:     Trachea: No tracheal deviation.  Cardiovascular:     Rate and Rhythm: Normal rate and regular rhythm.     Heart sounds: Normal heart sounds. No murmur heard.    No  friction rub. No gallop.  Pulmonary:     Effort: Pulmonary effort is normal. No respiratory distress.     Breath sounds: Normal breath sounds. No stridor. No wheezing or rales.  Chest:     Chest wall: No tenderness.  Abdominal:     General: Bowel sounds are normal. There is no distension.     Palpations: Abdomen is soft.     Tenderness:  There is no abdominal tenderness. There is no guarding or rebound.  Musculoskeletal:        General: Normal range of motion.     Cervical back: Normal range of motion and neck supple.  Lymphadenopathy:     Cervical: No cervical adenopathy.  Skin:    General: Skin is warm and dry.     Coloration: Skin is not pale.     Findings: No rash.  Neurological:     Mental Status: She is alert.     Motor: No abnormal muscle tone.     Coordination: Coordination normal.  Psychiatric:        Behavior: Behavior normal. Behavior is cooperative.         Assessment & Plan:   Problem List Items Addressed This Visit       Cardiovascular and Mediastinum   Hypertension, benign   Relevant Orders   Amb ref to Medical Nutrition Therapy-MNT     Respiratory   Obstructive sleep apnea   Relevant Orders   Amb ref to Medical Nutrition Therapy-MNT     Musculoskeletal and Integument   Primary osteoarthritis of right knee   Relevant Orders   Amb ref to Medical Nutrition Therapy-MNT     Other   Overweight (BMI 25.0-29.9)    Discussed healthy diet and increased activity Referral for nutritional education/consult - not sure if covered Pt not obese - explained there may be limited meds/tx options covered because of BMI category       Relevant Medications   phentermine (ADIPEX-P) 37.5 MG tablet   Other Relevant Orders   Amb ref to Medical Nutrition Therapy-MNT   Prediabetes - Primary   Relevant Orders   Amb ref to Medical Nutrition Therapy-MNT   Other Visit Diagnoses     Dyslipidemia       Relevant Orders   Amb ref to Medical Nutrition Therapy-MNT    Encounter for weight management       discussed FDA approved options, phentermine short term use, usually pt gain back weight, not long term option, work on lifestyle changes, monthly f/up   Relevant Medications   phentermine (ADIPEX-P) 37.5 MG tablet   Other Relevant Orders   Amb ref to Medical Nutrition Therapy-MNT        Return for 1 month f/up med check/weight check .   Danelle Berry, PA-C 12/13/21 11:58 AM

## 2021-12-14 ENCOUNTER — Other Ambulatory Visit: Payer: Self-pay | Admitting: Cardiology

## 2021-12-14 MED ORDER — AMLODIPINE BESYLATE 5 MG PO TABS: 5.0000 mg | ORAL_TABLET | Freq: Every day | ORAL | 0 refills | Status: DC

## 2021-12-19 DIAGNOSIS — M5416 Radiculopathy, lumbar region: Secondary | ICD-10-CM | POA: Diagnosis not present

## 2021-12-20 NOTE — Assessment & Plan Note (Signed)
Discussed healthy diet and increased activity Referral for nutritional education/consult - not sure if covered Pt not obese - explained there may be limited meds/tx options covered because of BMI category

## 2022-01-14 ENCOUNTER — Ambulatory Visit: Payer: BC Managed Care – PPO | Admitting: Family Medicine

## 2022-01-14 ENCOUNTER — Other Ambulatory Visit: Payer: Self-pay | Admitting: Family Medicine

## 2022-01-21 ENCOUNTER — Other Ambulatory Visit: Payer: Self-pay | Admitting: Obstetrics and Gynecology

## 2022-01-23 ENCOUNTER — Ambulatory Visit: Payer: BC Managed Care – PPO | Admitting: Pulmonary Disease

## 2022-01-29 ENCOUNTER — Other Ambulatory Visit: Payer: Self-pay

## 2022-01-29 ENCOUNTER — Encounter: Payer: Self-pay | Admitting: Obstetrics and Gynecology

## 2022-01-29 ENCOUNTER — Other Ambulatory Visit: Payer: Self-pay | Admitting: Family Medicine

## 2022-01-29 DIAGNOSIS — Z79890 Hormone replacement therapy (postmenopausal): Secondary | ICD-10-CM

## 2022-01-29 DIAGNOSIS — N951 Menopausal and female climacteric states: Secondary | ICD-10-CM

## 2022-01-29 MED ORDER — ESTRADIOL-NORETHINDRONE ACET 1-0.5 MG PO TABS: 1.0000 | ORAL_TABLET | Freq: Every day | ORAL | 0 refills | Status: DC

## 2022-02-14 ENCOUNTER — Ambulatory Visit: Payer: BC Managed Care – PPO | Admitting: Obstetrics and Gynecology

## 2022-02-14 DIAGNOSIS — N951 Menopausal and female climacteric states: Secondary | ICD-10-CM

## 2022-02-14 DIAGNOSIS — Z79890 Hormone replacement therapy (postmenopausal): Secondary | ICD-10-CM

## 2022-03-05 ENCOUNTER — Ambulatory Visit: Payer: BC Managed Care – PPO | Admitting: Obstetrics and Gynecology

## 2022-03-05 ENCOUNTER — Encounter: Payer: Self-pay | Admitting: Obstetrics and Gynecology

## 2022-03-05 VITALS — BP 125/84 | HR 64 | Ht 68.0 in | Wt 200.1 lb

## 2022-03-05 DIAGNOSIS — Z79890 Hormone replacement therapy (postmenopausal): Secondary | ICD-10-CM | POA: Diagnosis not present

## 2022-03-05 MED ORDER — ESTRADIOL-NORETHINDRONE ACET 1-0.5 MG PO TABS: 1.0000 | ORAL_TABLET | Freq: Every day | ORAL | 2 refills | Status: DC

## 2022-03-05 NOTE — Progress Notes (Signed)
HPI:      [name] is a 51 y.o. [phone] who LMP was No LMP recorded. Patient has had an ablation.  Subjective:   She presents today for a follow-up of her Activella.  She began taking it for hot flashes and other menopausal symptoms.  She likes taking the medication.  Her hot flashes have resolved.  She denies any vaginal bleeding.  She would like to remain on the [place].    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 She does not have any pertinent problems on file. She  has a past surgical history that includes Cholecystectomy (N/A, 01/19/2016); laser surgery on eye (Right, 10/2016); and Dilatation & currettage/hysteroscopy with novasure ablation (N/A, 03/24/2017). Her family history includes Cancer in her father, paternal grandfather, and paternal uncle; Diabetes in her brother, maternal grandmother, sister, and another family member; Hypertension in her father and mother; Kidney disease in her mother; Schizophrenia in her sister; Thyroid disease in her mother. She  reports that she has never smoked. She has never used smokeless tobacco. She reports current alcohol use of about 2.0 standard drinks of alcohol per week. She reports that she does not use drugs. She has a current medication list which  includes the following prescription(s): amlodipine, celecoxib, cholecalciferol, losartan, and estradiol-norethindrone. She is allergic to contrave [naltrexone-bupropion hcl er].       Review of Systems:  Review of Systems  Constitutional: Denied constitutional symptoms, night sweats, recent illness, fatigue, fever, insomnia and weight loss.  Eyes: Denied eye symptoms, eye pain, photophobia, vision change and visual disturbance.  Ears/Nose/Throat/Neck: Denied ear, nose, throat or neck symptoms, hearing loss, nasal discharge, sinus congestion and sore throat.  Cardiovascular: Denied cardiovascular symptoms, arrhythmia, chest pain/pressure, edema, exercise intolerance, orthopnea and palpitations.  Respiratory: Denied pulmonary symptoms, asthma, pleuritic pain, productive sputum, cough, dyspnea and wheezing.  Gastrointestinal: Denied, gastro-esophageal reflux, melena, nausea and vomiting.  Genitourinary: Denied genitourinary symptoms including symptomatic vaginal discharge, pelvic relaxation issues, and urinary complaints.  Musculoskeletal: Denied musculoskeletal symptoms, stiffness, swelling, muscle weakness and myalgia.  Dermatologic: Denied dermatology symptoms, rash and scar.  Neurologic: Denied neurology symptoms, dizziness, headache, neck pain and syncope.  Psychiatric: Denied psychiatric symptoms, anxiety and depression.  Endocrine: Denied endocrine symptoms including hot flashes and night sweats.   Meds:   Current Outpatient Medications on File Prior to Visit  Medication Sig Dispense Refill   amLODipine (NORVASC) 5 MG tablet Take 1 tablet (5 mg total) by mouth daily. 90 tablet 0   celecoxib (CELEBREX) 200 MG capsule Take 200 mg by mouth 2 (two) times daily.     Cholecalciferol (D3 PO) Take by mouth.     losartan (COZAAR) 100 MG tablet      No current facility-administered medications on file prior to visit.      Objective:     Vitals:   03/05/22 1527  BP: 125/84  Pulse: 64    Filed Weights   03/05/22 1527  Weight:  200 lb 1.6 oz (90.8 kg)                        Assessment:    [id number] Patient Active Problem List   Diagnosis Date Noted   Spinal stenosis of lumbar region 11/28/2021   Prediabetes 12/04/2020   Mixed hyperlipidemia 12/04/2020   Hypertension, benign 12/03/2019   Fibrocystic breast changes, bilateral 12/03/2019   Mild episode of recurrent major depressive disorder ([place]) 03/26/2019   Obstructive sleep apnea 12/30/2018   Vitamin B12 deficiency 01/08/2018   Vitamin D deficiency 01/08/2018   Positive ANA (antinuclear antibody) 12/15/2017   Primary osteoarthritis of right knee 05/13/2017   Overweight (BMI 25.0-29.9) 11/19/2016   White coat syndrome with hypertension 01/25/2016   Allergic rhinitis, seasonal 01/10/2015   Asymptomatic varicose veins of both lower extremities 01/10/2015     1. Postmenopausal hormone therapy     Patient doing well and likes Activella.  Would like to continue.  Hot flashes have resolved.   Plan:            1.  Continue Activella Orders No orders of the defined types were placed in this encounter.    Meds ordered this encounter  Medications   estradiol-norethindrone (ACTIVELLA) 1-0.5 MG tablet    Sig: Take 1 tablet by mouth daily.    Dispense:  90 tablet    Refill:  2      F/U  Return in about 9 months (around 12/04/2022) for Annual Physical. I spent 16 minutes involved in the care of this patient preparing to see the patient by obtaining and reviewing her medical history (including labs, imaging tests and prior procedures), documenting clinical information in the electronic health record (EHR), counseling and coordinating care plans, writing and sending prescriptions, ordering tests or procedures and in direct communicating with the patient and medical staff discussing pertinent items from her history and physical exam.  Finis Bud, M.D. 03/05/2022 3:52 PM

## 2022-03-05 NOTE — Progress Notes (Signed)
Patient presents today for HRT follow-up. She states since starting Activella she no longer has hot flashes and really likes the medication. No additional concerns at this time.

## 2022-03-08 DIAGNOSIS — H2513 Age-related nuclear cataract, bilateral: Secondary | ICD-10-CM | POA: Diagnosis not present

## 2022-03-08 DIAGNOSIS — H2512 Age-related nuclear cataract, left eye: Secondary | ICD-10-CM | POA: Diagnosis not present

## 2022-03-08 DIAGNOSIS — I10 Essential (primary) hypertension: Secondary | ICD-10-CM | POA: Diagnosis not present

## 2022-03-09 ENCOUNTER — Other Ambulatory Visit: Payer: Self-pay

## 2022-03-09 ENCOUNTER — Encounter: Payer: Self-pay | Admitting: Emergency Medicine

## 2022-03-09 ENCOUNTER — Emergency Department: Payer: BC Managed Care – PPO

## 2022-03-09 ENCOUNTER — Emergency Department
Admission: EM | Admit: 2022-03-09 | Discharge: 2022-03-09 | Disposition: A | Discharge status: Home / Self Care | Payer: BC Managed Care – PPO | Attending: Emergency Medicine | Admitting: Emergency Medicine

## 2022-03-09 DIAGNOSIS — R200 Anesthesia of skin: Secondary | ICD-10-CM | POA: Diagnosis not present

## 2022-03-09 DIAGNOSIS — M79602 Pain in left arm: Secondary | ICD-10-CM | POA: Diagnosis not present

## 2022-03-09 DIAGNOSIS — R079 Chest pain, unspecified: Secondary | ICD-10-CM | POA: Diagnosis not present

## 2022-03-09 LAB — CBC
HCT: 38.5 % (ref 36.0–46.0)
Hemoglobin: 12.9 g/dL (ref 12.0–15.0)
MCH: 30.4 pg (ref 26.0–34.0)
MCHC: 33.5 g/dL (ref 30.0–36.0)
MCV: 90.8 fL (ref 80.0–100.0)
Platelets: 349 10*3/uL (ref 150–400)
RBC: 4.24 MIL/uL (ref 3.87–5.11)
RDW: 12.7 % (ref 11.5–15.5)
WBC: 4.4 10*3/uL (ref 4.0–10.5)
nRBC: 0 % (ref 0.0–0.2)

## 2022-03-09 LAB — BASIC METABOLIC PANEL
Anion gap: 5 (ref 5–15)
BUN: 13 mg/dL (ref 6–20)
CO2: 23 mmol/L (ref 22–32)
Calcium: 8.6 mg/dL — ABNORMAL LOW (ref 8.9–10.3)
Chloride: 108 mmol/L (ref 98–111)
Creatinine, Ser: 0.84 mg/dL (ref 0.44–1.00)
GFR, Estimated: >60 mL/min (ref >60)
Glucose, Bld: 111 mg/dL — ABNORMAL HIGH (ref 70–99)
Potassium: 3.4 mmol/L — ABNORMAL LOW (ref 3.5–5.1)
Sodium: 136 mmol/L (ref 135–145)

## 2022-03-09 LAB — TROPONIN I (HIGH SENSITIVITY): Troponin I (High Sensitivity): 3 ng/L (ref <18)

## 2022-03-09 NOTE — ED Provider Notes (Signed)
[facility] Provider Note    Event Date/Time   First MD Initiated Contact with Patient 03/09/22 1553     (approximate)   History   Shoulder Pain and Jaw Pain   HPI  Andrea Pope is a 51 y.o. female who presents with complaints of left arm pain.  Patient reports since yesterday she has had a pain in her left anterior forearm, she denies injury to the area.  No swelling or bruising.  Normal strength in the extremity, no weakness no numbness.  Today she had discomfort in her left jaw/neck and became concerned that this was related to her heart.  She has no chest pain.  No history of heart disease.  No fevers or chills or cough.  No shortness of breath.  No calf pain or swelling.     Physical Exam   Triage Vital Signs: ED Triage Vitals  Enc Vitals Group     BP 03/09/22 1539 137/80     Pulse Rate 03/09/22 1539 68     Resp 03/09/22 1539 16     Temp 03/09/22 1539 98.9 F (37.2 C)     Temp Source 03/09/22 1539 Oral     SpO2 03/09/22 1539 95 %     Weight --      Height --      Head Circumference --      Peak Flow --      Pain Score 03/09/22 1641 0     Pain Loc --      Pain Edu? --      Excl. in [place]? --     Most recent vital signs: Vitals:   03/09/22 1539  BP: 137/80  Pulse: 68  Resp: 16  Temp: 98.9 F (37.2 C)  SpO2: 95%     General: Awake, no distress.  CV:  Good peripheral perfusion.  Resp:  Normal effort.  Abd:  No distention.  Other:  Left arm: Tenderness to palpation of the anterior forearm which replicates her pain exactly, no swelling or bruising noted.  Warm and well-perfused.  Normal pulses distally.  Normal strength in the left upper extremity, no neurodeficits.   ED Results / Procedures / Treatments   Labs (all labs ordered are listed, but only abnormal results are displayed) Labs Reviewed  BASIC METABOLIC PANEL - Abnormal; Notable for the following components:      Result Value   Potassium 3.4 (*)    Glucose, Bld 111  (*)    Calcium 8.6 (*)    All other components within normal limits  CBC  TROPONIN I (HIGH SENSITIVITY)     EKG  ED ECG REPORT I, Lavonia Drafts, the attending physician, personally viewed and interpreted this ECG.  Date: 03/09/2022  Rhythm: normal sinus rhythm QRS Axis: normal Intervals: normal ST/T Wave abnormalities: normal Narrative Interpretation: no evidence of acute ischemia    RADIOLOGY Chest x-ray viewed interpreted by me, no pneumonia    PROCEDURES:  Critical Care performed:   Procedures   MEDICATIONS ORDERED IN ED: Medications - No data to display   IMPRESSION / MDM / [place] / ED COURSE  I reviewed the triage vital signs and the nursing notes. Patient's presentation is most consistent with acute presentation with potential threat to life or bodily function.   Patient presents with symptoms as noted above.  Doubt ACS based on history, reassuring EKG, high sensitivity troponin pending  No weakness or numbness to suggest neurological deficits or radiculopathy  Suspect musculoskeletal pain given reproducible pain to the anterior forearm, recommend trial of NSAIDs, outpatient follow-up       FINAL CLINICAL IMPRESSION(S) / ED DIAGNOSES   Final diagnoses:  Pain of left upper extremity     Rx / DC Orders   ED Discharge Orders     None        Note:  This document was prepared using Dragon voice recognition software and may include unintentional dictation errors.   Jene Every, MD 03/09/22 1705

## 2022-03-09 NOTE — ED Triage Notes (Signed)
Pt with "numbness and pain" in left arm, shoulder, and jaw since last night.  No shob.  No n/v/d.  Pt states she just wanted to be checked out to be sure it was not her heart.

## 2022-03-20 ENCOUNTER — Ambulatory Visit: Payer: BC Managed Care – PPO | Admitting: Family Medicine

## 2022-03-20 ENCOUNTER — Other Ambulatory Visit: Payer: Self-pay | Admitting: Cardiology

## 2022-03-20 ENCOUNTER — Other Ambulatory Visit: Payer: Self-pay | Admitting: Family Medicine

## 2022-03-22 ENCOUNTER — Ambulatory Visit: Payer: BC Managed Care – PPO | Admitting: Cardiology

## 2022-03-25 DIAGNOSIS — H2510 Age-related nuclear cataract, unspecified eye: Secondary | ICD-10-CM | POA: Insufficient documentation

## 2022-03-26 DIAGNOSIS — Z79899 Other long term (current) drug therapy: Secondary | ICD-10-CM | POA: Diagnosis not present

## 2022-03-26 DIAGNOSIS — I10 Essential (primary) hypertension: Secondary | ICD-10-CM | POA: Diagnosis not present

## 2022-03-26 DIAGNOSIS — Z961 Presence of intraocular lens: Secondary | ICD-10-CM | POA: Diagnosis not present

## 2022-03-26 DIAGNOSIS — Z888 Allergy status to other drugs, medicaments and biological substances status: Secondary | ICD-10-CM | POA: Diagnosis not present

## 2022-03-26 DIAGNOSIS — H25042 Posterior subcapsular polar age-related cataract, left eye: Secondary | ICD-10-CM | POA: Diagnosis not present

## 2022-03-26 DIAGNOSIS — E663 Overweight: Secondary | ICD-10-CM | POA: Diagnosis not present

## 2022-03-26 DIAGNOSIS — H2512 Age-related nuclear cataract, left eye: Secondary | ICD-10-CM | POA: Diagnosis not present

## 2022-03-26 DIAGNOSIS — G4733 Obstructive sleep apnea (adult) (pediatric): Secondary | ICD-10-CM | POA: Diagnosis not present

## 2022-03-26 DIAGNOSIS — Z6829 Body mass index (BMI) 29.0-29.9, adult: Secondary | ICD-10-CM | POA: Diagnosis not present

## 2022-03-26 DIAGNOSIS — R7303 Prediabetes: Secondary | ICD-10-CM | POA: Diagnosis not present

## 2022-03-26 DIAGNOSIS — R760 Raised antibody titer: Secondary | ICD-10-CM | POA: Diagnosis not present

## 2022-03-26 DIAGNOSIS — Z8616 Personal history of COVID-19: Secondary | ICD-10-CM | POA: Diagnosis not present

## 2022-03-26 HISTORY — PX: CATARACT EXTRACTION W/ INTRAOCULAR LENS IMPLANT: SHX1309

## 2022-03-26 NOTE — Progress Notes (Unsigned)
Cardiology Clinic Note   Patient Name: ZYANNE FACKELMAN Date of Encounter: 03/27/2022  Primary Care Provider:  Delsa Grana, PA-C Primary Cardiologist:  Kate Sable, MD  Patient Profile    51 year old female with a past medical history of hypertension and anxiety, who presents today for follow-up.  Past Medical History    Past Medical History:  Diagnosis Date   Abnormal thyroid stimulating hormone (TSH) level 01/10/2015   Abnormal thyroid stimulating hormone level    Allergy    Anemia    iron deficiency   Encounter for initial prescription of injectable contraceptive    Female stress incontinence    Gallstones 12/05/2015   High cholesterol    History of ectopic pregnancy 11/29/2015   Hypertension    Menorrhagia with irregular cycle    Neutropenia ([place]) 07/23/2016   Ovarian cyst 11/29/2015   Ovarian cyst 11/29/2015   Patellofemoral stress syndrome 05/13/2017   Proteinuria 08/14/2015   Spider veins of both lower extremities    Status post D&C/hysteroscopy/endometrial ablation 03/24/2017   Good TVH candidate 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 Pathology: Proliferative endometrium with breakdown changes; negative for atypia/hyperplasia/carcinoma   Past Surgical History:  Procedure Laterality Date   CATARACT EXTRACTION W/ INTRAOCULAR LENS IMPLANT Left 03/26/2022   CHOLECYSTECTOMY N/A 01/19/2016   Procedure: LAPAROSCOPIC CHOLECYSTECTOMY WITH INTRAOPERATIVE CHOLANGIOGRAM;  Surgeon: Christene Lye, MD;  Location: [facility] ORS;  Service: General;  Laterality: N/A;   [place] N/A 03/24/2017   Procedure: DILATATION & CURETTAGE/HYSTEROSCOPY WITH NOVASURE ABLATION;  Surgeon: Brayton Mars, MD;  Location: [facility] ORS;  Service: Gynecology;  Laterality: N/A;   laser surgery on eye Right 10/2016    Allergies  Allergies  Allergen Reactions   Naltrexone-Bupropion Hcl Er Hypertension    Palpitations, hypertension     History of Present Illness    Cache[place] K. Alston is a 51 year old female with a past medical history of hypertension and anxiety.  She had a prior echocardiogram done 12/2019 which showed normal systolic and diastolic function with an LVEF of 60-65%, no regional wall motion abnormalities, no valvular abnormalities noted.  Coronary CTA was completed in 01/2020 which revealed a calcium score of 0 which showed no evidence of coronary artery disease.  She was last seen in clinic on 03/23/2021 by Dr. Garen Lah and was seen in clinic for elevated blood pressures.  She was started on losartan with eventually the addition of amlodipine.  She continues to still have occasional headaches and being extremely stressed at work.  She was evaluated in the [facility] emergency department on 03/09/2022 for shoulder pain and jaw pain.  Presents with pain in her arm jaw and neck she was concerned that it is heart pain.  Blood pressure was 137/80, pulse of 68, respirations of 16, temperature of 98.9.  Pertinent labs revealed potassium 3.4, blood glucose of 111, calcium of 8.6,High-sensitivity troponin of 2.  Emergency department felt that her presentation was most consistent with musculoskeletal pain given reproducible pain to the anterior forearm.  She was recommended a trial of NSAIDs and was advised to have outpatient follow-up.  She returns to clinic today stating that overall she has been doing fairly well from a cardiac standpoint.  She does endorse chronic back pain and received an injection and was told that she had some degenerative disease.  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  She also recently just had cataract surgery completed on her  left eye and is getting ready to undergo right eye cataract removal. She denies chest pain, shortness of breath, peripheral edema, syncope/near syncope.  Home Medications    Current Outpatient Medications  Medication Sig Dispense Refill    celecoxib (CELEBREX) 200 MG capsule Take 200 mg by mouth 2 (two) times daily.     Cholecalciferol (D3 PO) Take by mouth.     estradiol-norethindrone (ACTIVELLA) 1-0.5 MG tablet Take 1 tablet by mouth daily. 90 tablet 2   losartan (COZAAR) 100 MG tablet      moxifloxacin (VIGAMOX) 0.5 % ophthalmic solution Place 1 drop into the left eye 3 (three) times daily.     prednisoLONE acetate (PRED FORTE) 1 % ophthalmic suspension Place 1 drop into the left eye 4 (four) times daily.     amLODipine (NORVASC) 5 MG tablet Take 1 tablet (5 mg total) by mouth daily. 90 tablet 2   No current facility-administered medications for this visit.     Family History    Family History  Problem Relation Age of Onset   Hypertension Mother    Thyroid disease Mother    Kidney disease Mother    Hypertension Father    Cancer Father        Prostate   Diabetes Brother    Diabetes Other    Cancer Paternal Uncle        Prostate   Diabetes Maternal Grandmother    Cancer Paternal Grandfather        Prostate   Diabetes Sister    Schizophrenia Sister    Breast cancer Neg Hx    Ovarian cancer Neg Hx    Heart disease Neg Hx    She indicated that her mother is alive. She indicated that her father is deceased. She indicated that her sister is alive. She indicated that both of her brothers are alive. She indicated that her maternal grandmother is deceased. She indicated that her maternal grandfather is deceased. She indicated that her paternal grandmother is deceased. She indicated that her paternal grandfather is deceased. She indicated that her daughter is alive. She indicated that all of her three sons are alive. She indicated that the status of her paternal uncle is unknown. She indicated that the status of her neg hx is unknown. She indicated that the status of her other is unknown.  Social History    Social History   Socioeconomic History   Marital status: Married    Spouse name: Ron    Number of children:  4   Years of education: Not on file   Highest education level: Bachelor's degree (e.g., BA, AB, BS)  Occupational History   Occupation: Teacher  Tobacco Use   Smoking status: Never   Smokeless tobacco: Never  Vaping Use   Vaping Use: Never used  Substance and Sexual Activity   Alcohol use: Yes    Alcohol/week: 2.0 standard drinks of alcohol    Types: 2 Glasses of wine per week    Comment: twice a week   Drug use: No   Sexual activity: Yes    Partners: Male    Birth control/protection: Surgical    Comment: abalation  Other Topics Concern   Not on file  Social History Narrative   Not on file   Social Determinants of Health   Financial Resource Strain: Low Risk  (12/04/2020)   Overall Financial Resource Strain (CARDIA)    Difficulty of Paying Living Expenses: Not hard at all  Food Insecurity: No Food Insecurity (  12/10/2021)   Hunger Vital Sign    Worried About Running Out of Food in the Last Year: Never true    [place] in the Last Year: Never true  Transportation Needs: No Transportation Needs (12/10/2021)   PRAPARE - Hydrologist (Medical): No    Lack of Transportation (Non-Medical): No  Physical Activity: Insufficiently Active (12/10/2021)   Exercise Vital Sign    Days of Exercise per Week: 3 days    Minutes of Exercise per Session: 40 min  Stress: No Stress Concern Present (12/10/2021)   [place]    Feeling of Stress : Only a little  Social Connections: Moderately Isolated (12/10/2021)   Social Connection and Isolation Panel [NHANES]    Frequency of Communication with Friends and Family: Three times a week    Frequency of Social Gatherings with Friends and Family: Three times a week    Attends Religious Services: Never    Active Member of Clubs or Organizations: No    Attends Archivist Meetings: Never    Marital Status: Married  Human resources officer Violence: Not At  Risk (12/10/2021)   Humiliation, Afraid, Rape, and Kick questionnaire    Fear of Current or Ex-Partner: No    Emotionally Abused: No    Physically Abused: No    Sexually Abused: No     Review of Systems    General:  No chills, fever, night sweats or endorses weight changes.  Cardiovascular:  No chest pain, dyspnea on exertion, edema, orthopnea, palpitations, paroxysmal nocturnal dyspnea. Dermatological: No rash, lesions/masses Respiratory: No cough, dyspnea Musculoskeletal: Endorses back pain Urologic: No hematuria, dysuria Abdominal:   No nausea, vomiting, diarrhea, bright red blood per rectum, melena, or hematemesis Neurologic:  No visual changes, wkns, changes in mental status. All other systems reviewed and are otherwise negative except as noted above.   Physical Exam    VS:  BP 110/80 (BP Location: Left Arm, Patient Position: Sitting, Cuff Size: Normal)   Pulse (!) 55   Ht 5\' 8"  (1.727 m)   Wt 196 lb 8 oz (89.1 kg)   SpO2 98%   BMI 29.88 kg/m  , BMI Body mass index is 29.88 kg/m.     GEN: Well nourished, well developed, in no acute distress. HEENT: normal. Neck: Supple, no JVD, carotid bruits, or masses. Cardiac: RRR, no murmurs, rubs, or gallops. No clubbing, cyanosis, edema.  Radials/DP/PT 2+ and equal bilaterally.  Respiratory:  Respirations regular and unlabored, clear to auscultation bilaterally. GI: Soft, nontender, nondistended, BS + x 4. MS: no deformity or atrophy. Skin: warm and dry, no rash. Neuro:  Strength and sensation are intact. Psych: Normal affect.  Accessory Clinical Findings    ECG personally reviewed by me today-sinus bradycardia with a rate of 55 and LVH- No acute changes  Lab Results  Component Value Date   WBC 4.4 03/09/2022   HGB 12.9 03/09/2022   HCT 38.5 03/09/2022   MCV 90.8 03/09/2022   PLT 349 03/09/2022   Lab Results  Component Value Date   CREATININE 0.84 03/09/2022   BUN 13 03/09/2022   NA 136 03/09/2022   K 3.4 (L)  03/09/2022   CL 108 03/09/2022   CO2 23 03/09/2022   Lab Results  Component Value Date   ALT 21 12/10/2021   AST 14 12/10/2021   ALKPHOS 66 11/19/2016   BILITOT 0.5 12/10/2021   Lab Results  Component Value  Date   CHOL 239 (H) 12/10/2021   HDL 68 12/10/2021   LDLCALC 152 (H) 12/10/2021   TRIG 89 12/10/2021   CHOLHDL 3.5 12/10/2021    Lab Results  Component Value Date   HGBA1C 5.8 (H) 12/10/2021    Assessment & Plan   1.  Primary hypertension with a blood pressure today of 110/80.  Blood pressure has been stable without issues.  She is continued on amlodipine 5 mg daily and losartan 100 mg daily.  2.  Mixed hyperlipidemia with LDL of 152.  Previous discussion had by P CP about starting medication patient would like to continue to try working on diet and weight loss.  She has been encouraged to avoid fast, fried, and fatty foods.  Since she is not interested in starting a statin at this time discussion of supplements was had and she was advised that she could always try red yeast rice 600 mg daily as it is about the equivalent of 10-20mg  of simvastatin.   3.  Disposition patient return to clinic to see MD/APP in 1 year with EKG on return or sooner if needed  Jamar Casagrande, NP 03/27/2022, 9:08 AM

## 2022-03-27 ENCOUNTER — Encounter: Payer: Self-pay | Admitting: Cardiology

## 2022-03-27 ENCOUNTER — Ambulatory Visit: Payer: BC Managed Care – PPO | Attending: Cardiology | Admitting: Cardiology

## 2022-03-27 VITALS — BP 110/80 | HR 55 | Ht 68.0 in | Wt 196.5 lb

## 2022-03-27 DIAGNOSIS — H2511 Age-related nuclear cataract, right eye: Secondary | ICD-10-CM | POA: Diagnosis not present

## 2022-03-27 DIAGNOSIS — E7800 Pure hypercholesterolemia, unspecified: Secondary | ICD-10-CM | POA: Diagnosis not present

## 2022-03-27 DIAGNOSIS — I10 Essential (primary) hypertension: Secondary | ICD-10-CM

## 2022-03-27 MED ORDER — AMLODIPINE BESYLATE 5 MG PO TABS: 5.0000 mg | ORAL_TABLET | Freq: Every day | ORAL | 2 refills | Status: DC

## 2022-03-27 NOTE — Patient Instructions (Signed)
Medication Instructions:  Your physician recommends that you continue on your current medications as directed. Please refer to the Current Medication list given to you today.  *If you need a refill on your cardiac medications before your next appointment, please call your pharmacy*   Lab Work: None ordered  If you have labs (blood work) drawn today and your tests are completely normal, you will receive your results only by: MyChart Message (if you have MyChart) OR A paper copy in the mail If you have any lab test that is abnormal or we need to change your treatment, we will call you to review the results.   Testing/Procedures: None ordered  Follow-Up: At [facility], you and your health needs are our priority.  As part of our continuing mission to provide you with exceptional heart care, we have created designated Provider Care Teams.  These Care Teams include your primary Cardiologist (physician) and Advanced Practice Providers (APPs -  Physician Assistants and Nurse Practitioners) who all work together to provide you with the care you need, when you need it.  We recommend signing up for the patient portal called "MyChart".  Sign up information is provided on this After Visit Summary.  MyChart is used to connect with patients for Virtual Visits (Telemedicine).  Patients are able to view lab/test results, encounter notes, upcoming appointments, etc.  Non-urgent messages can be sent to your provider as well.   To learn more about what you can do with MyChart, go to [email or web address].    Your next appointment:   1 year(s)  The format for your next appointment:   In Person  Provider:   You may see Brian Agbor-Etang, MD or one of the following Advanced Practice Providers on your designated Care Team:   Christopher Berge, NP Ryan Dunn, PA-C Cadence Furth, PA-C Sheri Hammock, NP   Important Information About Sugar       

## 2022-04-16 DIAGNOSIS — I10 Essential (primary) hypertension: Secondary | ICD-10-CM | POA: Diagnosis not present

## 2022-04-16 DIAGNOSIS — Z793 Long term (current) use of hormonal contraceptives: Secondary | ICD-10-CM | POA: Diagnosis not present

## 2022-04-16 DIAGNOSIS — H2511 Age-related nuclear cataract, right eye: Secondary | ICD-10-CM | POA: Diagnosis not present

## 2022-04-16 DIAGNOSIS — Z9049 Acquired absence of other specified parts of digestive tract: Secondary | ICD-10-CM | POA: Diagnosis not present

## 2022-04-16 DIAGNOSIS — G4733 Obstructive sleep apnea (adult) (pediatric): Secondary | ICD-10-CM | POA: Diagnosis not present

## 2022-04-16 DIAGNOSIS — Z888 Allergy status to other drugs, medicaments and biological substances status: Secondary | ICD-10-CM | POA: Diagnosis not present

## 2022-04-16 DIAGNOSIS — Z79899 Other long term (current) drug therapy: Secondary | ICD-10-CM | POA: Diagnosis not present

## 2022-04-16 DIAGNOSIS — R7303 Prediabetes: Secondary | ICD-10-CM | POA: Diagnosis not present

## 2022-04-17 ENCOUNTER — Ambulatory Visit: Payer: BC Managed Care – PPO

## 2022-04-17 NOTE — Progress Notes (Deleted)
SUBJECTIVE:  51 y.o. female complains of {pe vag discharge desc:315065} vaginal discharge for *** {gen duration:315003}. Denies abnormal vaginal bleeding or significant pelvic pain or fever. No UTI symptoms. Denies history of known exposure to STD.  No LMP recorded. Patient has had an ablation.  OBJECTIVE:  She appears well, afebrile. Abdomen: benign, soft, nontender, no masses. Pelvic Exam: {pelvic exam:315900}. Urine dipstick: {ua dip:315113}.  ASSESSMENT:  {vaginitis type:315262}  PLAN:  GC and chlamydia DNA  probe sent to lab. Treatment: {vaginitis tx:315263} ROV prn if symptoms persist or worsen.

## 2022-04-26 ENCOUNTER — Other Ambulatory Visit: Payer: Self-pay | Admitting: Family Medicine

## 2022-05-01 ENCOUNTER — Encounter: Payer: Self-pay | Admitting: Family Medicine

## 2022-05-02 ENCOUNTER — Telehealth: Payer: Self-pay | Admitting: Cardiology

## 2022-05-02 MED ORDER — LOSARTAN POTASSIUM 100 MG PO TABS: ORAL_TABLET | ORAL | 2 refills | Status: DC

## 2022-05-02 NOTE — Telephone Encounter (Signed)
*  STAT* If patient is at the pharmacy, call can be transferred to refill team.   1. Which medications need to be refilled? (please list name of each medication and dose if known) losartan (COZAAR) 100 MG tablet    2. Which pharmacy/location (including street and city if local pharmacy) is medication to be sent to?[facility] [address], [place] - [address]   3. Do they need a 30 day or 90 day supply?  90 day  Patient is out of medication

## 2022-05-02 NOTE — Telephone Encounter (Signed)
Requested Prescriptions   Signed Prescriptions Disp Refills   losartan (COZAAR) 100 MG tablet 90 tablet 2    Sig: Take 1 tablet by mouth daily.    Authorizing Provider: HAMMOCK, SHERI    Ordering User: Thayer Headings, Milliana Reddoch L

## 2022-06-14 ENCOUNTER — Encounter: Payer: Self-pay | Admitting: Obstetrics and Gynecology

## 2022-07-01 ENCOUNTER — Ambulatory Visit: Payer: 59 | Admitting: Family Medicine

## 2022-07-01 ENCOUNTER — Encounter: Payer: Self-pay | Admitting: Family Medicine

## 2022-07-01 VITALS — BP 122/70 | HR 73 | Temp 98.8°F | Resp 16 | Ht 68.0 in | Wt 196.6 lb

## 2022-07-01 DIAGNOSIS — E785 Hyperlipidemia, unspecified: Secondary | ICD-10-CM

## 2022-07-01 DIAGNOSIS — R110 Nausea: Secondary | ICD-10-CM

## 2022-07-01 DIAGNOSIS — I10 Essential (primary) hypertension: Secondary | ICD-10-CM

## 2022-07-01 DIAGNOSIS — R0602 Shortness of breath: Secondary | ICD-10-CM

## 2022-07-01 DIAGNOSIS — R7303 Prediabetes: Secondary | ICD-10-CM

## 2022-07-01 DIAGNOSIS — R55 Syncope and collapse: Secondary | ICD-10-CM

## 2022-07-01 DIAGNOSIS — R5383 Other fatigue: Secondary | ICD-10-CM

## 2022-07-01 DIAGNOSIS — R7989 Other specified abnormal findings of blood chemistry: Secondary | ICD-10-CM

## 2022-07-01 DIAGNOSIS — R419 Unspecified symptoms and signs involving cognitive functions and awareness: Secondary | ICD-10-CM

## 2022-07-01 MED ORDER — FAMOTIDINE 20 MG PO TABS: 20.0000 mg | ORAL_TABLET | Freq: Two times a day (BID) | ORAL | 1 refills | Status: DC | PRN

## 2022-07-01 MED ORDER — ONDANSETRON HCL 4 MG PO TABS: 4.0000 mg | ORAL_TABLET | Freq: Three times a day (TID) | ORAL | 0 refills | Status: DC | PRN

## 2022-07-01 NOTE — Patient Instructions (Addendum)
Effexor may be a good additional med/treatment if your symptoms continue or worsen (perimenopausal symptoms) (effexor/venlefaxine - once daily lowest dose 37.5 mg)  I will be checking labs to hopefully rule out potential causes of some of your symptoms. Try the pepcid twice a day for nausea and if that doesn't help try the zofran  Anxiety or perimenopausal syndrome can be related to a lot of your symptoms - but its difficult to say which is why we'll try some of these tests or treatment options first  I would strongly consider effexor or talk to Dr. Amalia Hailey about it and your symptoms (if they continue) Or we can also have you consult with a therapist or psychiatris to try and see if these are some physical symptoms related to possible anxiety  I do recommend you contact you CPAP supply company or managing specialist to address that - since not having sleep apnea or CPAP working can cause a lot of sleep disruptions, put stress on your heart and lungs, and spill over into your day to day.  I am not sure it its related to your shortness of breath.  Shortness of breath - if getting no better or no worse then at some point we would need to do a screening chest x-ray and get pulmonary function tests done. If you start to have any cardiac symptoms at all - then we would also need to recheck your heart.  With multiple generalized and vague symptoms it can often be difficult to find the underlying reason or reasons.    Please follow up in a few weeks if still having symptoms so we reevaluate and make a new plan.

## 2022-07-01 NOTE — Progress Notes (Signed)
Patient ID: Andrea Pope, female    DOB: June 09, 1970, 52 y.o.   MRN: [id number]  PCP: Delsa Grana, PA-C  Chief Complaint  Patient presents with   Nausea    Almost 2 weeks, daily all day   Shortness of Breath    Unsure if its related to anxiety flare up    Subjective:   Andrea Pope is a 52 y.o. female, presents to clinic with CC of the following:  HPI  SOB - hard to catch a breath, occurs intermittently, no pain, doesn't feel tight or wheezy - just all the sudden she feels like can't catch her breath.  She denies associated cough, wheezing, recent respiratory illness, palpitations, orthopnea, PND, lower extremity edema.  Sometimes she will lay down and put her CPAP on and take deep breaths and that helps her symptoms.  She notes her CPAP may not be working well at night.  Yesterday she went walking in her neighborhood and had no worsening shortness of breath with exertion.  She denies any chest wall pain or pleuritic chest pain. Symptoms have been ongoing for about 2 weeks she is not sure if it is related to her anxiety that she had in the past she notes symptoms occurring when she does not feel anxious and recently her anxiety has been well-controlled  Also persistent nausea in stomach x 2 weeks-feels very similar to past pregnancy nausea - no vomiting, overall bowels normal except for loose stool a few days ago with something going around her job, did not make nausea worse, she denies associated abdominal pain, urinary symptoms, indigestion, early bloating or fullness overall reports normal appetite.  She is may be having some food sensitivities but nothing specific  Episodes of nausea are sometimes associated with episodes of lightheadedness, no syncope  She also wonders if her symptoms could be.  Menopausal symptoms she does see GYN Dr. Amalia Hailey and she is on continuous OCP, she notes generalized fatigue, brain fog, decreased energy, worse mood      Patient Active  Problem List   Diagnosis Date Noted   Spinal stenosis of lumbar region 11/28/2021   Prediabetes 12/04/2020   Mixed hyperlipidemia 12/04/2020   Hypertension, benign 12/03/2019   Fibrocystic breast changes, bilateral 12/03/2019   Mild episode of recurrent major depressive disorder ([place]) 03/26/2019   Obstructive sleep apnea 12/30/2018   Vitamin B12 deficiency 01/08/2018   Vitamin D deficiency 01/08/2018   Positive ANA (antinuclear antibody) 12/15/2017   Primary osteoarthritis of right knee 05/13/2017   Overweight (BMI 25.0-29.9) 11/19/2016   White coat syndrome with hypertension 01/25/2016   Allergic rhinitis, seasonal 01/10/2015   Asymptomatic varicose veins of both lower extremities 01/10/2015      Current Outpatient Medications:    celecoxib (CELEBREX) 200 MG capsule, Take 200 mg by mouth 2 (two) times daily., Disp: , Rfl:    Cholecalciferol (D3 PO), Take by mouth., Disp: , Rfl:    estradiol-norethindrone (ACTIVELLA) 1-0.5 MG tablet, Take 1 tablet by mouth daily., Disp: 90 tablet, Rfl: 2   losartan (COZAAR) 100 MG tablet, Take 1 tablet by mouth daily., Disp: 90 tablet, Rfl: 2   prednisoLONE acetate (PRED FORTE) 1 % ophthalmic suspension, Place 1 drop into the left eye 4 (four) times daily., Disp: , Rfl:    amLODipine (NORVASC) 5 MG tablet, Take 1 tablet (5 mg total) by mouth daily., Disp: 90 tablet, Rfl: 2   moxifloxacin (VIGAMOX) 0.5 % ophthalmic solution, Place 1 drop into the left eye  3 (three) times daily. (Patient not taking: Reported on 07/01/2022), Disp: , Rfl:    Allergies  Allergen Reactions   Naltrexone-Bupropion Hcl Er Hypertension    Palpitations, hypertension     Social History   Tobacco Use   Smoking status: Never   Smokeless tobacco: Never  Vaping Use   Vaping Use: Never used  Substance Use Topics   Alcohol use: Yes    Alcohol/week: 2.0 standard drinks of alcohol    Types: 2 Glasses of wine per week    Comment: twice a week   Drug use: No      Chart  Review Today: I personally reviewed active problem list, medication list, allergies, family history, social history, health maintenance, notes from last encounter, lab results, imaging with the patient/caregiver today.   Review of Systems  Constitutional: Negative.  Negative for appetite change and unexpected weight change.  HENT: Negative.    Eyes: Negative.   Respiratory: Negative.    Cardiovascular: Negative.   Gastrointestinal: Negative.   Endocrine: Negative.   Genitourinary: Negative.   Musculoskeletal: Negative.   Skin: Negative.   Allergic/Immunologic: Negative.   Neurological: Negative.   Hematological: Negative.   Psychiatric/Behavioral: Negative.    All other systems reviewed and are negative.      Objective:   Vitals:   07/01/22 1130  BP: 122/70  Pulse: 73  Resp: 16  Temp: 98.8 F (37.1 C)  TempSrc: Oral  SpO2: 100%  Weight: 196 lb 9.6 oz (89.2 kg)  Height: '5\' 8"'$  (1.727 m)    Body mass index is 29.89 kg/m.  Physical Exam Vitals and nursing note reviewed.  Constitutional:      General: She is not in acute distress.    Appearance: Normal appearance. She is well-developed, well-groomed and overweight. She is not ill-appearing, toxic-appearing or diaphoretic.  HENT:     Head: Normocephalic and atraumatic.     Right Ear: External ear normal.     Left Ear: External ear normal.     Nose: Nose normal.  Eyes:     General: No scleral icterus.       Right eye: No discharge.        Left eye: No discharge.     Conjunctiva/sclera: Conjunctivae normal.  Neck:     Trachea: No tracheal deviation.  Cardiovascular:     Rate and Rhythm: Regular rhythm. Bradycardia present.     Pulses: Normal pulses.     Heart sounds: Normal heart sounds. No murmur heard.    No friction rub. No gallop.  Pulmonary:     Effort: Pulmonary effort is normal. No tachypnea, accessory muscle usage, prolonged expiration, respiratory distress or retractions.     Breath sounds: Normal  breath sounds. No stridor, decreased air movement or transmitted upper airway sounds. No decreased breath sounds, wheezing, rhonchi or rales.  Chest:     Chest wall: No tenderness.  Abdominal:     General: Abdomen is flat. Bowel sounds are normal. There is no distension.     Palpations: Abdomen is soft.     Tenderness: There is no abdominal tenderness. There is no right CVA tenderness, left CVA tenderness, guarding or rebound.  Musculoskeletal:     Right lower leg: No edema.     Left lower leg: No edema.  Skin:    General: Skin is warm and dry.     Findings: No rash.  Neurological:     Mental Status: She is alert. Mental status is at baseline.  Motor: No abnormal muscle tone.     Coordination: Coordination normal.     Gait: Gait normal.  Psychiatric:        Attention and Perception: Attention normal.        Mood and Affect: Mood and affect normal.        Speech: Speech normal.        Behavior: Behavior normal. Behavior is cooperative.      Results for orders placed or performed during the hospital encounter of [id number]  Basic metabolic panel  Result Value Ref Range   Sodium 136 135 - 145 mmol/L   Potassium 3.4 (L) 3.5 - 5.1 mmol/L   Chloride 108 98 - 111 mmol/L   CO2 23 22 - 32 mmol/L   Glucose, Bld 111 (H) 70 - 99 mg/dL   BUN 13 6 - 20 mg/dL   Creatinine, Ser 0.84 0.44 - 1.00 mg/dL   Calcium 8.6 (L) 8.9 - 10.3 mg/dL   GFR, Estimated >60 >60 mL/min   Anion gap 5 5 - 15  CBC  Result Value Ref Range   WBC 4.4 4.0 - 10.5 K/uL   RBC 4.24 3.87 - 5.11 MIL/uL   Hemoglobin 12.9 12.0 - 15.0 g/dL   HCT 38.5 36.0 - 46.0 %   MCV 90.8 80.0 - 100.0 fL   MCH 30.4 26.0 - 34.0 pg   MCHC 33.5 30.0 - 36.0 g/dL   RDW 12.7 11.5 - 15.5 %   Platelets 349 150 - 400 K/uL   nRBC 0.0 0.0 - 0.2 %  Troponin I (High Sensitivity)  Result Value Ref Range   Troponin I (High Sensitivity) 3 <18 ng/L       Assessment & Plan:   Pt presents with roughly 2 weeks of multiple generalized  complaints - worse are SOB and nausea  1. Nausea No abdominal pain, vomiting, no appetite or weight changes Abdominal exam is benign We will try Pepcid (possibly vague GERD symptoms?)  Can also try Zofran if Pepcid is not helping and consider PPI  - famotidine (PEPCID) 20 MG tablet; Take 1 tablet (20 mg total) by mouth 2 (two) times daily as needed (nausea).  Dispense: 60 tablet; Refill: 1 - ondansetron (ZOFRAN) 4 MG tablet; Take 1 tablet (4 mg total) by mouth every 8 (eight) hours as needed for refractory nausea / vomiting.  Dispense: 20 tablet; Refill: 0 - CBC with Differential/Platelet -screen white count for infection - COMPLETE METABOLIC PANEL WITH GFR -check electrolytes and LFTs - Urinalysis, Routine w reflex microscopic -no urinary symptoms screen urine - Lipase   2. Near syncope Some episodes of lightheadedness/near syncope associated with nausea -no other associated symptoms including not correlating with the shortness of breath and no cardiac symptoms She did mention some soft BP readings she may want to try cutting amlodipine in half or holding it and seeing if that makes a difference -BP today is well-controlled - CBC with Differential/Platelet - TSH  3. Shortness of breath Her physical exam today was unremarkable -episodes are random and intermittent without any other associated symptoms particularly no wheeze, congestion, cough, pleuritic chest pain, no chest wall pain and no worsening with exertion actually her symptoms improved with walking around her neighborhood and improved with lying down and wearing her CPAP She is unsure if this could be related to anxiety or anxiety attacks Could possibly do chest x-ray or PFTs? Offered to try and treat anxiety symptoms -recommended she consider Effexor for her history of anxiety, recent  mood energy changes and suspicion that she may be having perimenopausal symptoms - DG Chest 2 View; Future - CBC with Differential/Platelet  4.  Fatigue, unspecified type Vague and generalized -rule out anemia, hypothyroid both of which she has had previously, screen for deficiencies electrolyte abnormalities - CBC with Differential/Platelet - COMPLETE METABOLIC PANEL WITH GFR - TSH - Vitamin B12 - VITAMIN D 25 Hydroxy (Vit-D Deficiency, Fractures) - T4, free  5. Cognitive complaints She reports difficulty with memory and cognition -she is not sure if it is normal aging or could be perimenopausal symptoms - CBC with Differential/Platelet - COMPLETE METABOLIC PANEL WITH GFR - TSH - Vitamin B12 - VITAMIN D 25 Hydroxy (Vit-D Deficiency, Fractures)  6. Prediabetes Recheck A1c - COMPLETE METABOLIC PANEL WITH GFR - Hemoglobin A1c  7. Hypertension, benign BP at goal today on losartan 100 and amlodipine 5 mg managed by cardiology - COMPLETE METABOLIC PANEL WITH GFR  8. Dyslipidemia Last lipids elevated patient is not on a cholesterol-lowering medication, will not recheck today, labs were done 6 months ago Lab Results  Component Value Date   CHOL 239 (H) 12/10/2021   HDL 68 12/10/2021   LDLCALC 152 (H) 12/10/2021   TRIG 89 12/10/2021   CHOLHDL 3.5 12/10/2021  - COMPLETE METABOLIC PANEL WITH GFR  9. Abnormal TSH Multiple generalized and vague symptoms recheck TSH with her history of abnormal TSH in the past - TSH - T4, free   Recommended we check labs, try Pepcid for her nausea, she consider Effexor and can follow-up with her GYN Follow-up here in the next 2 to 4 weeks if symptoms are still ongoing   Delsa Grana, PA-C 07/01/22 11:49 AM

## 2022-07-17 ENCOUNTER — Ambulatory Visit (INDEPENDENT_AMBULATORY_CARE_PROVIDER_SITE_OTHER): Payer: 59 | Admitting: Obstetrics and Gynecology

## 2022-07-17 ENCOUNTER — Other Ambulatory Visit (HOSPITAL_COMMUNITY)
Admission: RE | Admit: 2022-07-17 | Discharge: 2022-07-17 | Disposition: A | Discharge status: Home / Self Care | Payer: 59 | Source: Ambulatory Visit | Attending: Obstetrics and Gynecology | Admitting: Obstetrics and Gynecology

## 2022-07-17 ENCOUNTER — Encounter: Payer: Self-pay | Admitting: Obstetrics and Gynecology

## 2022-07-17 VITALS — BP 116/76 | HR 74 | Ht 68.0 in | Wt 196.2 lb

## 2022-07-17 DIAGNOSIS — Z124 Encounter for screening for malignant neoplasm of cervix: Secondary | ICD-10-CM | POA: Insufficient documentation

## 2022-07-17 DIAGNOSIS — Z01419 Encounter for gynecological examination (general) (routine) without abnormal findings: Secondary | ICD-10-CM | POA: Diagnosis not present

## 2022-07-17 DIAGNOSIS — Z79890 Hormone replacement therapy (postmenopausal): Secondary | ICD-10-CM

## 2022-07-17 MED ORDER — ESTRADIOL-NORETHINDRONE ACET 1-0.5 MG PO TABS: 1.0000 | ORAL_TABLET | Freq: Every day | ORAL | 3 refills | Status: DC

## 2022-07-17 NOTE — Progress Notes (Signed)
Patients presents for annual exam today. She states doing well with current HRT, would like to continue. History of ablation. Patient is due for pap smear, ordered. Patient is up to date with mammogram. Annual labs are deferred to PCP. She states no other questions or concerns at this time.

## 2022-07-17 NOTE — Progress Notes (Signed)
HPI:      [name] is a 52 y.o. [phone] who LMP was No LMP recorded. Patient has had an ablation.  Subjective:   She presents today for her annual examination.  She is taking Activella for HRT and she likes it.  She is not having hot flashes.  She denies bleeding.  She has previously had an endometrial ablation.    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 She does not have any pertinent problems on file. She  has a past surgical history that includes Cholecystectomy (N/A, 01/19/2016); laser surgery on eye (Right, 10/2016); Dilatation & currettage/hysteroscopy with novasure ablation (N/A, 03/24/2017); and Cataract extraction w/ intraocular lens implant (Left, 03/26/2022). Her family history includes Cancer in her father, paternal grandfather, and paternal uncle; Diabetes in her brother, maternal grandmother, sister, and another family member; Hypertension in her father and mother; Kidney disease in her mother; Schizophrenia in her sister; Thyroid disease in her mother. She  reports that she has never smoked. She has never used smokeless tobacco. She reports current alcohol use of about 2.0 standard drinks of alcohol per week. She reports that she does not use drugs. She has a current medication list  which includes the following prescription(s): celecoxib, cholecalciferol, famotidine, losartan, moxifloxacin, ondansetron, prednisolone acetate, amlodipine, and estradiol-norethindrone. She is allergic to naltrexone-bupropion hcl er.       Review of Systems:  Review of Systems  Constitutional: Denied constitutional symptoms, night sweats, recent illness, fatigue, fever, insomnia and weight loss.  Eyes: Denied eye symptoms, eye pain, photophobia, vision change and visual disturbance.  Ears/Nose/Throat/Neck: Denied ear, nose, throat or neck symptoms, hearing loss, nasal discharge, sinus congestion and sore throat.  Cardiovascular: Denied cardiovascular symptoms, arrhythmia, chest pain/pressure, edema, exercise intolerance, orthopnea and palpitations.  Respiratory: Denied pulmonary symptoms, asthma, pleuritic pain, productive sputum, cough, dyspnea and wheezing.  Gastrointestinal: Denied, gastro-esophageal reflux, melena, nausea and vomiting.  Genitourinary: Denied genitourinary symptoms including symptomatic vaginal discharge, pelvic relaxation issues, and urinary complaints.  Musculoskeletal: Denied musculoskeletal symptoms, stiffness, swelling, muscle weakness and myalgia.  Dermatologic: Denied dermatology symptoms, rash and scar.  Neurologic: Denied neurology symptoms, dizziness, headache, neck pain and syncope.  Psychiatric: Denied psychiatric symptoms, anxiety and depression.  Endocrine: Denied endocrine symptoms including hot flashes and night sweats.   Meds:   Current Outpatient Medications on File Prior to Visit  Medication Sig Dispense Refill   celecoxib (CELEBREX) 200 MG capsule Take 200 mg by mouth 2 (two) times daily.     Cholecalciferol (D3 PO) Take by mouth.     famotidine (PEPCID) 20 MG tablet Take 1 tablet (20 mg total) by mouth 2 (two) times daily as needed (nausea). 60 tablet 1   losartan (COZAAR) 100 MG tablet Take 1 tablet by mouth daily. 90 tablet 2   moxifloxacin  (VIGAMOX) 0.5 % ophthalmic solution Place 1 drop into the left eye 3 (three) times daily.     ondansetron (ZOFRAN) 4 MG tablet Take  1 tablet (4 mg total) by mouth every 8 (eight) hours as needed for refractory nausea / vomiting. 20 tablet 0   prednisoLONE acetate (PRED FORTE) 1 % ophthalmic suspension Place 1 drop into the left eye 4 (four) times daily.     amLODipine (NORVASC) 5 MG tablet Take 1 tablet (5 mg total) by mouth daily. 90 tablet 2   No current facility-administered medications on file prior to visit.     Objective:     Vitals:   07/17/22 1328  BP: 116/76  Pulse: 74    Filed Weights   07/17/22 1328  Weight: 196 lb 3.2 oz (89 kg)              Physical examination General NAD, Conversant  HEENT Atraumatic; Op clear with mmm.  Normo-cephalic. Pupils reactive. Anicteric sclerae  Thyroid/Neck Smooth without nodularity or enlargement. Normal ROM.  Neck Supple.  Skin No rashes, lesions or ulceration. Normal palpated skin turgor. No nodularity.  Breasts: No masses or discharge.  Symmetric.  No axillary adenopathy.  Lungs: Clear to auscultation.No rales or wheezes. Normal Respiratory effort, no retractions.  Heart: NSR.  No murmurs or rubs appreciated. No peripheral edema  Abdomen: Soft.  Non-tender.  No masses.  No HSM. No hernia  Extremities: Moves all appropriately.  Normal ROM for age. No lymphadenopathy.  Neuro: Oriented to PPT.  Normal mood. Normal affect.     Pelvic:   Vulva: Normal appearance.  No lesions.  Vagina: No lesions or abnormalities noted.  Support: Normal pelvic support.  Urethra No masses tenderness or scarring.  Meatus Normal size without lesions or prolapse.  Cervix: Normal appearance.  No lesions.  Anus: Normal exam.  No lesions.  Perineum: Normal exam.  No lesions.        Bimanual   Uterus: Normal size.  Non-tender.  Mobile.  AV.  Adnexae: No masses.  Non-tender to palpation.  Cul-de-sac: Negative for abnormality.     Assessment:     [id number] Patient Active Problem List   Diagnosis Date Noted   Spinal stenosis of lumbar region 11/28/2021   Prediabetes 12/04/2020   Mixed hyperlipidemia 12/04/2020   Hypertension, benign 12/03/2019   Fibrocystic breast changes, bilateral 12/03/2019   Mild episode of recurrent major depressive disorder ([place]) 03/26/2019   Obstructive sleep apnea 12/30/2018   Vitamin B12 deficiency 01/08/2018   Vitamin D deficiency 01/08/2018   Positive ANA (antinuclear antibody) 12/15/2017   Primary osteoarthritis of right knee 05/13/2017   Overweight (BMI 25.0-29.9) 11/19/2016   White coat syndrome with hypertension 01/25/2016   Allergic rhinitis, seasonal 01/10/2015   Asymptomatic varicose veins of both lower extremities 01/10/2015     1. Well woman exam with routine gynecological exam   2. Cervical cancer screening   3. Postmenopausal hormone therapy     Normal exam-HRT without issue.   Plan:            1.  Basic Screening Recommendations The basic screening recommendations for asymptomatic women were discussed with the patient during her visit.  The age-appropriate recommendations were discussed with her and the rational for the tests reviewed.  When I am informed by the patient that another primary care physician has previously obtained the age-appropriate tests and they are up-to-date, only outstanding tests are ordered and referrals given as necessary.  Abnormal results of tests will be discussed with her when all of her results are completed.  Routine preventative health maintenance measures emphasized: Exercise/Diet/Weight control, Tobacco Warnings, Alcohol/Substance use risks and  Stress Management Pap performed-patient up-to-date on mammogram.  2.  Continue Activella. Orders No orders of the defined types were placed in this encounter.    Meds ordered this encounter  Medications   estradiol-norethindrone (ACTIVELLA) 1-0.5 MG tablet    Sig: Take 1 tablet by mouth daily.    Dispense:   90 tablet    Refill:  3          F/U  Return in about 1 year (around 07/17/2023) for Annual Physical.  Finis Bud, M.D. 07/17/2022 1:48 PM

## 2022-07-18 LAB — COMPLETE METABOLIC PANEL WITH GFR
AG Ratio: 1.5 (calc) (ref 1.0–2.5)
ALT: 14 U/L (ref 6–29)
AST: 15 U/L (ref 10–35)
Albumin: 4.4 g/dL (ref 3.6–5.1)
Alkaline phosphatase (APISO): 59 U/L (ref 31–125)
BUN/Creatinine Ratio: 11 (calc) (ref 6–22)
BUN: 12 mg/dL (ref 7–25)
CO2: 26 mmol/L (ref 20–32)
Calcium: 9.6 mg/dL (ref 8.6–10.2)
Chloride: 104 mmol/L (ref 98–110)
Creat: 1.05 mg/dL — ABNORMAL HIGH (ref 0.50–0.99)
Globulin: 2.9 g/dL (calc) (ref 1.9–3.7)
Glucose, Bld: 97 mg/dL (ref 65–99)
Potassium: 3.9 mmol/L (ref 3.5–5.3)
Sodium: 140 mmol/L (ref 135–146)
Total Bilirubin: 0.4 mg/dL (ref 0.2–1.2)
Total Protein: 7.3 g/dL (ref 6.1–8.1)
eGFR: 65 mL/min/{1.73_m2} (ref >=60)

## 2022-07-18 LAB — URINALYSIS, ROUTINE W REFLEX MICROSCOPIC
Bilirubin Urine: NEGATIVE
Glucose, UA: NEGATIVE
Hgb urine dipstick: NEGATIVE
Ketones, ur: NEGATIVE
Leukocytes,Ua: NEGATIVE
Nitrite: NEGATIVE
Protein, ur: NEGATIVE
Specific Gravity, Urine: 1.022 (ref 1.001–1.035)
pH: 6 (ref 5.0–8.0)

## 2022-07-18 LAB — CBC WITH DIFFERENTIAL/PLATELET
Absolute Monocytes: 312 cells/uL (ref 200–950)
Basophils Absolute: 19 cells/uL (ref 0–200)
Basophils Relative: 0.5 %
Eosinophils Absolute: 122 cells/uL (ref 15–500)
Eosinophils Relative: 3.2 %
HCT: 41.6 % (ref 35.0–45.0)
Hemoglobin: 13.8 g/dL (ref 11.7–15.5)
Lymphs Abs: 1835 cells/uL (ref 850–3900)
MCH: 29.8 pg (ref 27.0–33.0)
MCHC: 33.2 g/dL (ref 32.0–36.0)
MCV: 89.8 fL (ref 80.0–100.0)
MPV: 10.7 fL (ref 7.5–12.5)
Monocytes Relative: 8.2 %
Neutro Abs: 1512 cells/uL (ref 1500–7800)
Neutrophils Relative %: 39.8 %
Platelets: 349 10*3/uL (ref 140–400)
RBC: 4.63 10*6/uL (ref 3.80–5.10)
RDW: 12.3 % (ref 11.0–15.0)
Total Lymphocyte: 48.3 %
WBC: 3.8 10*3/uL (ref 3.8–10.8)

## 2022-07-18 LAB — HEMOGLOBIN A1C
Hgb A1c MFr Bld: 5.8 % of total Hgb — ABNORMAL HIGH (ref <5.7)
Mean Plasma Glucose: 120 mg/dL
eAG (mmol/L): 6.6 mmol/L

## 2022-07-18 LAB — LIPASE: Lipase: 21 U/L (ref 7–60)

## 2022-07-18 LAB — T4, FREE: Free T4: 1 ng/dL (ref 0.8–1.8)

## 2022-07-18 LAB — TSH: TSH: 0.56 mIU/L

## 2022-07-18 LAB — VITAMIN B12: Vitamin B-12: 361 pg/mL (ref 200–1100)

## 2022-07-18 LAB — VITAMIN D 25 HYDROXY (VIT D DEFICIENCY, FRACTURES): Vit D, 25-Hydroxy: 30 ng/mL (ref 30–100)

## 2022-07-22 LAB — CYTOLOGY - PAP
Comment: NEGATIVE
Diagnosis: NEGATIVE
High risk HPV: NEGATIVE

## 2022-08-01 ENCOUNTER — Encounter: Payer: Self-pay | Admitting: Family Medicine

## 2022-08-22 ENCOUNTER — Other Ambulatory Visit: Payer: Self-pay | Admitting: Family Medicine

## 2022-08-22 DIAGNOSIS — E663 Overweight: Secondary | ICD-10-CM

## 2022-08-22 DIAGNOSIS — Z7689 Persons encountering health services in other specified circumstances: Secondary | ICD-10-CM

## 2022-08-26 ENCOUNTER — Ambulatory Visit: Payer: 59 | Admitting: Family Medicine

## 2022-08-27 ENCOUNTER — Encounter: Payer: Self-pay | Admitting: Family Medicine

## 2022-08-27 ENCOUNTER — Ambulatory Visit (INDEPENDENT_AMBULATORY_CARE_PROVIDER_SITE_OTHER): Payer: 59 | Admitting: Family Medicine

## 2022-08-27 VITALS — BP 132/80 | HR 67 | Temp 98.1°F | Resp 16 | Ht 68.0 in | Wt 201.1 lb

## 2022-08-27 DIAGNOSIS — Z6830 Body mass index (BMI) 30.0-30.9, adult: Secondary | ICD-10-CM | POA: Diagnosis not present

## 2022-08-27 DIAGNOSIS — E669 Obesity, unspecified: Secondary | ICD-10-CM | POA: Diagnosis not present

## 2022-08-27 DIAGNOSIS — Z7689 Persons encountering health services in other specified circumstances: Secondary | ICD-10-CM | POA: Diagnosis not present

## 2022-08-27 MED ORDER — BUPROPION HCL ER (XL) 150 MG PO TB24: 150.0000 mg | ORAL_TABLET | Freq: Every day | ORAL | 1 refills | Status: DC

## 2022-08-27 NOTE — Progress Notes (Unsigned)
Patient ID: Andrea Pope, female    DOB: 08-18-1970, 52 y.o.   MRN: [id number]  PCP: Danelle Berry, PA-C  Chief Complaint  Patient presents with   Weight Loss    Interested in meds    Subjective:   Andrea Pope is a 52 y.o. female, presents to clinic with CC of the following:  HPI   Here for meds for weight loss  She just consulted with weight management specialist - see below - note reviewed and copied below She does not believe her insurance will cover any medications for weight loss No DM, but Hx of prediabetes Lab Results  Component Value Date   HGBA1C 5.8 (H) 07/17/2022   She tried phentermine last year and felt like her heart was going to explode   Voncille Lo, FNP - 08/20/2022 5:00 PM EDT Formatting of this note is different from the original. Assessment and Care Plan  1. Endogenous obesity 2. BMI 30.0-30.9, adult 3. Dietary counseling 4. Exercise counseling  Shylynn is here for initial visit for weight management.  Discussed weight, diet, and exercise with patient in relation to health conditions. Explored patient's motivation level, barriers, previous attempts, and goals.  Jodell completed a body composition exercise and was provided a printed report which we reviewed. We discussed resting metabolic rate, BMI, body fat percentage and visceral fat.  Labs within 6 months were reviewed.  5. Screening for metabolic disorder  -CBC, CMP, W0J, TSH drawn within the past six months; lipid panel drawn in the office today. -RMR test not completed today.  Reviewed current medications and medical conditions, and identified those that can promote weight gain. Past or present use of medications that may cause weight gain includes contraception/hormone therapy  Discussed the indications for, risks of and benefits of bariatric surgery. Emelee is not a candidate for bariatric surgery.  Discussed medications that can help with weight loss. Patient is a  candidate for phentermine, topiramate, Qsymia, bupropion, Contrave, Wegovy, metformin, and Zepbound. Reviewed the mechanism of action, side effects, and contraindications of each. Patient is interested in pharmacotherapy for obesity at this time. Patient is not prescribed any medication today. She will contact her insurance company to see if Wegovy or Zepbound are covered options.  Discussed [facility] program structure with four pillars including medical, nutrition, fitness and behavioral, and the services available within each of these disciplines. Roselene was informed of the importance of frequent follow-up visits to maximize success with intensive lifestyle modifications. Over the next few weeks, we will develop an individualized care plan specific to their BMI, comorbidities, goals and resources.  Resting metabolic rate testing was not completed today.  Body mass index is 30.62 kg/m.  Long term goal: 170 lbs  Short term 5% goal: 10.1 lbs  Current weight loss medications: none at this time  [facility] Weight History: 1. 201.4 lbs 08/20/22  - As a part of the [facility] program, as referred by MD, pt will receive MNT (Medical Nutrition Therapy) -She will complete a movement consult with the exercise specialist. -She was offered a behavioral health assessment. She declines at this time.  Goals set for next appointment include: 1. Begin logging food and beverage intake in preparation for next appointment. 2. Increase water intake with goal of 64oz water daily 3. Begin to work on increasing exercise, incorporating cardio and resistance training with long-term goal of 150 minutes weekly. Short term goal of continue walking dog daily and complete movement consult.  Pt has the following  obesity related comorbidities which were reviewed but not discussed today, considered in medical decision making, and will be further addressed at subsequent visit: Prediabetes, Hypertension, Hyperlipidemia, OSA,  Depression, Osteoarthritis, and allergic rhinitis . We will provide disease specific education in these areas and will discuss the impact that 5-10% weight loss will have on health conditions.  6. Mixed hyperlipidemia  -Excess body weight can lead to increased cholesterol levels. Obesity and high cholesterol are both risk factors for cardiovascular disease. LDL is referred to as the "bad" cholesterol and HDL is referred to as the "good" cholesterol. High levels of LDL can increase plaque buildup in the arteries. Optimal HDL levels can help reduce the risk of heart disease. Research shows that a weight loss of 5-10% of body weight may significantly improve cholesterol and triglyceride levels.  7. Hypertension, benign  -Excess weight can cause increases in blood pressure, which increases your risk for other cardiovascular issues as well. Losing weight can help take the extra pressure off of your blood vessels and arterial walls. Research shows significant improvement in blood pressure readings with 5-10% body weight loss.  8. Prediabetes  The exact cause of prediabetes is unknown, but there are many risk factors that can affect this including weight, waist size, diet, age, family history, race or ethnicity, PCOS, sleep, and tobacco smoke. Being overweight is a primary risk factor for prediabetes; the more fatty tissue you have, the more resistant your cells become to insulin. When food is digested, sugar enters your bloodstream. Insulin, which is produced by the pancreas, allows sugar to enter your cells, which lowers the amount of sugar in your blood. When you have prediabetes, this process doesn't work as well, causing a buildup up sugar in the bloodstream.  9. Obstructive sleep apnea  Inadequate sleep can affect the hormones in your body, specifically those signalling hunger and satiety, which can lead to eating more than normal, overindulging, and weight gain. Inadequate sleep can also affect the  desire to exercise. Over time, reduced physical activity can lead to weight gain due to not burning enough calories or building muscle mass.       Patient Active Problem List   Diagnosis Date Noted   Spinal stenosis of lumbar region 11/28/2021   Prediabetes 12/04/2020   Mixed hyperlipidemia 12/04/2020   Hypertension, benign 12/03/2019   Fibrocystic breast changes, bilateral 12/03/2019   Mild episode of recurrent major depressive disorder 03/26/2019   Obstructive sleep apnea 12/30/2018   Vitamin B12 deficiency 01/08/2018   Vitamin D deficiency 01/08/2018   Positive ANA (antinuclear antibody) 12/15/2017   Primary osteoarthritis of right knee 05/13/2017   Overweight (BMI 25.0-29.9) 11/19/2016   White coat syndrome with hypertension 01/25/2016   Allergic rhinitis, seasonal 01/10/2015   Asymptomatic varicose veins of both lower extremities 01/10/2015      Current Outpatient Medications:    celecoxib (CELEBREX) 200 MG capsule, Take 200 mg by mouth 2 (two) times daily., Disp: , Rfl:    Cholecalciferol (D3 PO), Take by mouth., Disp: , Rfl:    estradiol-norethindrone (ACTIVELLA) 1-0.5 MG tablet, Take 1 tablet by mouth daily., Disp: 90 tablet, Rfl: 3   famotidine (PEPCID) 20 MG tablet, Take 1 tablet (20 mg total) by mouth 2 (two) times daily as needed (nausea)., Disp: 60 tablet, Rfl: 1   losartan (COZAAR) 100 MG tablet, Take 1 tablet by mouth daily., Disp: 90 tablet, Rfl: 2   moxifloxacin (VIGAMOX) 0.5 % ophthalmic solution, Place 1 drop into the left eye 3 (three)  times daily., Disp: , Rfl:    amLODipine (NORVASC) 5 MG tablet, Take 1 tablet (5 mg total) by mouth daily., Disp: 90 tablet, Rfl: 2   ondansetron (ZOFRAN) 4 MG tablet, Take 1 tablet (4 mg total) by mouth every 8 (eight) hours as needed for refractory nausea / vomiting. (Patient not taking: Reported on 08/27/2022), Disp: 20 tablet, Rfl: 0   prednisoLONE acetate (PRED FORTE) 1 % ophthalmic suspension, Place 1 drop into the left eye  4 (four) times daily. (Patient not taking: Reported on 08/27/2022), Disp: , Rfl:    Allergies  Allergen Reactions   Naltrexone-Bupropion Hcl Er Hypertension    Palpitations, hypertension     Social History   Tobacco Use   Smoking status: Never   Smokeless tobacco: Never  Vaping Use   Vaping Use: Never used  Substance Use Topics   Alcohol use: Yes    Alcohol/week: 2.0 standard drinks of alcohol    Types: 2 Glasses of wine per week    Comment: twice a week   Drug use: No      Chart Review Today: I personally reviewed active problem list, medication list, allergies, family history, social history, health maintenance, notes from last encounter, lab results, imaging with the patient/caregiver today.   Review of Systems  Constitutional: Negative.   HENT: Negative.    Eyes: Negative.   Respiratory: Negative.    Cardiovascular: Negative.   Gastrointestinal: Negative.   Endocrine: Negative.   Genitourinary: Negative.   Musculoskeletal: Negative.   Skin: Negative.   Allergic/Immunologic: Negative.   Neurological: Negative.   Hematological: Negative.   Psychiatric/Behavioral: Negative.    All other systems reviewed and are negative.      Objective:   Vitals:   08/27/22 1542  BP: 132/80  Pulse: 67  Resp: 16  Temp: 98.1 F (36.7 C)  TempSrc: Oral  SpO2: 99%  Weight: 201 lb 1.6 oz (91.2 kg)  Height:  (1.727 m)    Body mass index is 30.58 kg/m.  Physical Exam Vitals and nursing note reviewed.  Constitutional:      General: She is not in acute distress.    Appearance: Normal appearance. She is well-developed. She is obese. She is not ill-appearing, toxic-appearing or diaphoretic.  HENT:     Head: Normocephalic and atraumatic.     Nose: Nose normal.  Eyes:     General:        Right eye: No discharge.        Left eye: No discharge.     Conjunctiva/sclera: Conjunctivae normal.  Neck:     Trachea: No tracheal deviation.  Cardiovascular:     Rate and  Rhythm: Normal rate.  Pulmonary:     Effort: Pulmonary effort is normal. No respiratory distress.     Breath sounds: No stridor.  Musculoskeletal:        General: Normal range of motion.  Skin:    General: Skin is warm and dry.     Findings: No rash.  Neurological:     Mental Status: She is alert.     Motor: No abnormal muscle tone.     Coordination: Coordination normal.  Psychiatric:        Behavior: Behavior normal.      Results for orders placed or performed in visit on 07/17/22  Cytology - PAP  Result Value Ref Range   High risk HPV Negative    Adequacy      Satisfactory for evaluation; transformation zone component PRESENT.  Diagnosis      - Negative for intraepithelial lesion or malignancy (NILM)   Microorganisms      Fungal organisms present consistent with Candida spp.   Comment Normal Reference Range HPV - Negative        Assessment & Plan:   1. Class 1 obesity with body mass index (BMI) of 30.0 to 30.9 in adult, unspecified obesity type, unspecified whether serious comorbidity present Weight for the past couple years ranges 180-200 Recent consult with weight management specialists  Prior severe SE with phentermine - CP/rapid heart rate "heart going to explode" Prior SE/"allergy" to [employer] Reviewed FDA approved meds again with pt and the specialists reviewed this with her as well - its listing in OV and AVS, she knows insurance doesn't cover any of the meds and she is frustrated   Wt Readings from Last 40 Encounters:  08/27/22 201 lb 1.6 oz (91.2 kg)  07/17/22 196 lb 3.2 oz (89 kg)  07/01/22 196 lb 9.6 oz (89.2 kg)  03/27/22 196 lb 8 oz (89.1 kg)  03/05/22 200 lb 1.6 oz (90.8 kg)  12/13/21 196 lb (88.9 kg)  12/10/21 198 lb 6.4 oz (90 kg)  09/25/21 194 lb 12.8 oz (88.4 kg)  06/08/21 187 lb (84.8 kg)  03/23/21 191 lb (86.6 kg)  12/22/20 199 lb (90.3 kg)  12/04/20 198 lb 1.6 oz (89.9 kg)  09/01/20 197 lb (89.4 kg)  08/11/20 198 lb (89.8 kg)  07/26/20  195 lb 9.6 oz (88.7 kg)  06/13/20 191 lb (86.6 kg)  05/09/20 191 lb 1.6 oz (86.7 kg)  04/24/20 188 lb 12.8 oz (85.6 kg)  04/19/20 191 lb 3.2 oz (86.7 kg)  01/28/20 187 lb (84.8 kg)  12/23/19 188 lb 4.8 oz (85.4 kg)  12/08/19 189 lb 11.2 oz (86 kg)  12/06/19 187 lb (84.8 kg)  12/03/19 188 lb 3.2 oz (85.4 kg)  11/10/19 185 lb (83.9 kg)  11/05/19 187 lb 2 oz (84.9 kg)  10/28/19 191 lb 6.4 oz (86.8 kg)  09/27/19 191 lb (86.6 kg)  09/26/19 190 lb (86.2 kg)  09/24/19 193 lb 6.4 oz (87.7 kg)  09/07/19 191 lb 14.4 oz (87 kg)  07/06/19 188 lb (85.3 kg)  05/26/19 190 lb 3.2 oz (86.3 kg)  04/24/19 185 lb (83.9 kg)  03/26/19 194 lb 6.4 oz (88.2 kg)  12/30/18 200 lb 1.6 oz (90.8 kg)  11/26/18 196 lb (88.9 kg)  11/23/18 195 lb 12.8 oz (88.8 kg)  10/28/18 195 lb 9.6 oz (88.7 kg)  09/09/18 193 lb (87.5 kg)   BMI Readings from Last 5 Encounters:  08/27/22 30.58 kg/m  07/17/22 29.83 kg/m  07/01/22 29.89 kg/m  03/27/22 29.88 kg/m  03/05/22 30.43 kg/m     2. Encounter for weight management See above Did encourage her to continue with weight management specialists and focus on healthy foods, net neg calorie intake relative to BMR, active lifestyle as able She has been able to loose weight in the past Explained we can try individual drug components and cash pricing but we have no sway in insurance coverage or drug pricing   After reviewing FDA approved meds, chart, prior SE and pt consulting with insurance coverage further during exam today decided to try wellbutrin alone to see if it helps with weight loss     Danelle Berry, PA-C 08/27/22 3:46 PM

## 2022-08-28 ENCOUNTER — Encounter: Payer: Self-pay | Admitting: Family Medicine

## 2022-08-29 ENCOUNTER — Encounter: Payer: Self-pay | Admitting: Family Medicine

## 2022-08-29 NOTE — Patient Instructions (Addendum)
Looks like in the past other providers discussed this with you are started contrave: 2020 with Maurice Small: Overweight: She is frustrated with her weight loss journey. 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 Diet is balanced and healthy. Discussed weight loss medications - she would like to try Contrave. We will rx today. Body mass index is 29.56 kg/m.    You BMI with your current visit is 30.58, only 1 point different than a few years ago    Prior visit with me last year in August:  Amb ref to Medical Nutrition Therapy-MNT Encounter for weight management discussed FDA approved options, phentermine short term use, usually pt gain back weight, not long term option, work on lifestyle changes, monthly f/up Relevant Medications phentermine (ADIPEX-P) 37.5 MG tablet Other Relevant Orders Amb ref to Medical Nutrition Therapy-MNT   The prescription was written as follows: Start with half tablet PO qd prior to breakfast for 7 d then increase to 1 tab 37.5 mg po qd prior to breakfast

## 2022-09-04 ENCOUNTER — Emergency Department: Payer: No Typology Code available for payment source

## 2022-09-04 ENCOUNTER — Encounter: Payer: Self-pay | Admitting: Emergency Medicine

## 2022-09-04 ENCOUNTER — Emergency Department
Admission: EM | Admit: 2022-09-04 | Discharge: 2022-09-04 | Disposition: A | Discharge status: Home / Self Care | Payer: No Typology Code available for payment source | Attending: Emergency Medicine | Admitting: Emergency Medicine

## 2022-09-04 ENCOUNTER — Other Ambulatory Visit: Payer: Self-pay

## 2022-09-04 DIAGNOSIS — I10 Essential (primary) hypertension: Secondary | ICD-10-CM | POA: Diagnosis not present

## 2022-09-04 DIAGNOSIS — M25512 Pain in left shoulder: Secondary | ICD-10-CM | POA: Insufficient documentation

## 2022-09-04 DIAGNOSIS — R079 Chest pain, unspecified: Secondary | ICD-10-CM

## 2022-09-04 DIAGNOSIS — R0789 Other chest pain: Secondary | ICD-10-CM | POA: Insufficient documentation

## 2022-09-04 LAB — BASIC METABOLIC PANEL
Anion gap: 10 (ref 5–15)
BUN: 12 mg/dL (ref 6–20)
CO2: 21 mmol/L — ABNORMAL LOW (ref 22–32)
Calcium: 9.1 mg/dL (ref 8.9–10.3)
Chloride: 104 mmol/L (ref 98–111)
Creatinine, Ser: 0.88 mg/dL (ref 0.44–1.00)
GFR, Estimated: >60 mL/min (ref >60)
Glucose, Bld: 88 mg/dL (ref 70–99)
Potassium: 3.7 mmol/L (ref 3.5–5.1)
Sodium: 135 mmol/L (ref 135–145)

## 2022-09-04 LAB — CBC
HCT: 40.2 % (ref 36.0–46.0)
Hemoglobin: 13.6 g/dL (ref 12.0–15.0)
MCH: 30.4 pg (ref 26.0–34.0)
MCHC: 33.8 g/dL (ref 30.0–36.0)
MCV: 89.9 fL (ref 80.0–100.0)
Platelets: 340 10*3/uL (ref 150–400)
RBC: 4.47 MIL/uL (ref 3.87–5.11)
RDW: 12.5 % (ref 11.5–15.5)
WBC: 3.4 10*3/uL — ABNORMAL LOW (ref 4.0–10.5)
nRBC: 0 % (ref 0.0–0.2)

## 2022-09-04 LAB — TROPONIN I (HIGH SENSITIVITY): Troponin I (High Sensitivity): 4 ng/L (ref <18)

## 2022-09-04 NOTE — ED Triage Notes (Signed)
Patient to ED for left shoulder pain that radiates down into left arm. States CP earlier today under breast but none at this time. Denies shoulder injury. No currently pain at this time.

## 2022-09-04 NOTE — Discharge Instructions (Signed)
You were seen in the emergency department for chest pain and left shoulder discomfort.  You had heart enzymes that were normal and did not show any signs of a heart attack.  Your EKG was normal.  Your lab work was normal.  Your chest x-ray and the x-ray of your left shoulder showed no findings to explain your pain today.  Follow-up closely with your primary care provider.  Return to the emergency department for any worsening symptoms.  Continue to take your Pepcid and your antihypertensive medications.  Pain control:  Ibuprofen (motrin/aleve/advil) - You can take 3-4 tablets (600-800 mg) every 6 hours as needed for pain/fever.  Acetaminophen (tylenol) - You can take 2 extra strength tablets (1000 mg) every 6 hours as needed for pain/fever.  You can alternate these medications or take them together.  Make sure you eat food/drink water when taking these medications.

## 2022-09-04 NOTE — ED Provider Notes (Signed)
[facility] Provider Note    Event Date/Time   First MD Initiated Contact with Patient 09/04/22 1316     (approximate)   History   Shoulder Pain   HPI  Andrea Pope is a 52 y.o. female past medical history significant for hypertension who presents to the emergency department with chest pain and arm pain.  States that over the past couple of days she felt a little bit of discomfort to her left shoulder and has been driving more with her right side.  Today had 2 separate episodes of sharp pain to her epigastric and left chest wall.  Associated with some mild nausea.  States that her symptoms have since resolved.  Denies any significant shortness of breath.  Denies any nausea, vomiting at this time.  No diarrhea.  No abdominal pain.  No prior history of DVT or PE.  Questionable stress testing in the past that was years ago.  Denies any exertional chest pain.  No family history of coronary artery disease at a young age.  Denies any tobacco use or marijuana use.  No recent cough.  No recent trip or travel.     Physical Exam   Triage Vital Signs: ED Triage Vitals  Enc Vitals Group     BP 09/04/22 1309 139/88     Pulse Rate 09/04/22 1309 75     Resp 09/04/22 1309 18     Temp 09/04/22 1309 98.3 F (36.8 C)     Temp Source 09/04/22 1309 Oral     SpO2 09/04/22 1309 100 %     Weight --      Height --      Head Circumference --      Peak Flow --      Pain Score 09/04/22 1310 0     Pain Loc --      Pain Edu? --      Excl. in GC? --     Most recent vital signs: Vitals:   09/04/22 1309  BP: 139/88  Pulse: 75  Resp: 18  Temp: 98.3 F (36.8 C)  SpO2: 100%    Physical Exam Constitutional:      Appearance: She is well-developed.  HENT:     Head: Atraumatic.  Eyes:     Conjunctiva/sclera: Conjunctivae normal.  Cardiovascular:     Rate and Rhythm: Regular rhythm.  Pulmonary:     Effort: No respiratory distress.  Abdominal:     General: There  is no distension.  Musculoskeletal:        General: Normal range of motion.     Cervical back: Normal range of motion.     Comments: Full range of motion of the left shoulder.  +2 radial pulses that are equal bilaterally.  No tenderness to palpation to the chest wall.  No tenderness to palpation to the midline cervical spine.  Skin:    General: Skin is warm.  Neurological:     Mental Status: She is alert. Mental status is at baseline.     IMPRESSION / MDM / ASSESSMENT AND PLAN / ED COURSE  I reviewed the triage vital signs and the nursing notes.  Differential diagnosis including ACS, pneumonia, pleurisy, radiculopathy, musculoskeletal.    EKG  I, Corena Herter, the attending physician, personally viewed and interpreted this ECG.   Rate: Normal  Rhythm: Normal sinus  Axis: Normal  Intervals: Normal  ST&T Change: None  No tachycardic or bradycardic dysrhythmias while on cardiac telemetry.  RADIOLOGY I independently reviewed imaging, my interpretation of imaging: Chest x-ray with no signs of pneumonia.  X-ray of the left shoulder with no acute fracture or dislocation.  LABS (all labs ordered are listed, but only abnormal results are displayed) Labs interpreted as -    Labs Reviewed  BASIC METABOLIC PANEL - Abnormal; Notable for the following components:      Result Value   CO2 21 (*)    All other components within normal limits  CBC - Abnormal; Notable for the following components:   WBC 3.4 (*)    All other components within normal limits  POC URINE PREG, ED  TROPONIN I (HIGH SENSITIVITY)     MDM  Low risk Wells criteria, only positive finding on Wells would be that she is 52 years old otherwise PERC negative.  No shortness of breath.  No ongoing pleuritic chest pain.  I have a low suspicion for pulmonary embolism.  Heart score is low risk, initial troponin is negative.  Patient's chest pain has been ongoing for more than the past couple of hours do not feel that  serial troponins are necessary at this time.  On chart review patient had prior cardiac stress test with CT coronaries that showed no acute findings.  Most likely musculoskeletal, radiculopathy or stress-induced.  Discussed close follow-up with primary care provider.  Given return precautions for any worsening symptoms.     PROCEDURES:  Critical Care performed: No  Procedures  Patient's presentation is most consistent with acute presentation with potential threat to life or bodily function.   MEDICATIONS ORDERED IN ED: Medications - No data to display  FINAL CLINICAL IMPRESSION(S) / ED DIAGNOSES   Final diagnoses:  Acute pain of left shoulder  Chest pain, unspecified type     Rx / DC Orders   ED Discharge Orders     None        Note:  This document was prepared using Dragon voice recognition software and may include unintentional dictation errors.   Corena Herter, MD 09/04/22 1408

## 2022-09-17 ENCOUNTER — Telehealth (INDEPENDENT_AMBULATORY_CARE_PROVIDER_SITE_OTHER): Payer: 59 | Admitting: Family Medicine

## 2022-09-17 DIAGNOSIS — E669 Obesity, unspecified: Secondary | ICD-10-CM

## 2022-09-17 DIAGNOSIS — Z6830 Body mass index (BMI) 30.0-30.9, adult: Secondary | ICD-10-CM

## 2022-09-17 NOTE — Progress Notes (Signed)
Appt changed/no show after note was prepped

## 2022-09-20 ENCOUNTER — Telehealth (INDEPENDENT_AMBULATORY_CARE_PROVIDER_SITE_OTHER): Payer: 59 | Admitting: Family Medicine

## 2022-09-20 DIAGNOSIS — M1711 Unilateral primary osteoarthritis, right knee: Secondary | ICD-10-CM

## 2022-09-20 DIAGNOSIS — R7303 Prediabetes: Secondary | ICD-10-CM

## 2022-09-20 DIAGNOSIS — I10 Essential (primary) hypertension: Secondary | ICD-10-CM

## 2022-09-20 DIAGNOSIS — E669 Obesity, unspecified: Secondary | ICD-10-CM

## 2022-09-20 DIAGNOSIS — Z6830 Body mass index (BMI) 30.0-30.9, adult: Secondary | ICD-10-CM

## 2022-09-20 DIAGNOSIS — Z7689 Persons encountering health services in other specified circumstances: Secondary | ICD-10-CM | POA: Diagnosis not present

## 2022-09-20 DIAGNOSIS — G4733 Obstructive sleep apnea (adult) (pediatric): Secondary | ICD-10-CM | POA: Diagnosis not present

## 2022-09-20 DIAGNOSIS — E782 Mixed hyperlipidemia: Secondary | ICD-10-CM

## 2022-09-20 MED ORDER — PHENTERMINE-TOPIRAMATE 3.75-23 MG PO CP24
1.0000 | ORAL_CAPSULE | ORAL | 0 refills | Status: DC
Start: 2022-09-20 — End: 2022-12-16

## 2022-09-20 NOTE — Progress Notes (Signed)
Name: Andrea Pope   MRN: [id number]    DOB: 08-28-1970   Date:09/20/2022       Progress Note  Subjective:    Chief Complaint  Chief Complaint  Patient presents with   Medication Problem    Would like to stop Wellbutrin due to side effects. 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    I connected with  Siobahn K Pyle  on 09/20/22 at  9:00 AM EDT by a video enabled telemedicine application and verified that I am speaking with the correct person using two identifiers.  I discussed the limitations of evaluation and management by telemedicine and the availability of in person appointments. The patient expressed understanding and agreed to proceed. Staff also discussed with the patient that there may be a patient responsible charge related to this service. Patient Location: home Provider Location: [facility] clinic Additional Individuals present: none  HPI  Pt wants to stop wellbutrin and try Qsymia  Cleared previously by cardiology to try phentermine She went to medical weight management but did not decide to continue with them due to cost  She also would like the referral to MNT again  Patient Active Problem List   Diagnosis Date Noted   Spinal stenosis of lumbar region 11/28/2021   Prediabetes 12/04/2020   Mixed hyperlipidemia 12/04/2020   Hypertension, benign 12/03/2019   Fibrocystic breast changes, bilateral 12/03/2019   Mild episode of recurrent major depressive disorder (HCC) 03/26/2019   Obstructive sleep apnea 12/30/2018   Vitamin B12 deficiency 01/08/2018   Vitamin D deficiency 01/08/2018   Positive ANA (antinuclear antibody) 12/15/2017   Primary osteoarthritis of right knee 05/13/2017   Overweight (BMI 25.0-29.9) 11/19/2016   White coat syndrome with hypertension 01/25/2016   Allergic rhinitis, seasonal 01/10/2015   Asymptomatic varicose veins of both lower extremities 01/10/2015    Social History   Tobacco Use   Smoking status: Never   Smokeless tobacco: Never   Substance Use Topics   Alcohol use: Yes    Alcohol/week: 2.0 standard drinks of alcohol    Types: 2 Glasses of wine per week    Comment: twice a week     Current Outpatient Medications:    celecoxib (CELEBREX) 200 MG capsule, Take 200 mg by mouth 2 (two) times daily., Disp: , Rfl:    Cholecalciferol (D3 PO), Take by mouth., Disp: , Rfl:    Difluprednate 0.05 % EMUL, Apply to eye., Disp: , Rfl:    estradiol-norethindrone (ACTIVELLA) 1-0.5 MG tablet, Take 1 tablet by mouth daily., Disp: 90 tablet, Rfl: 3   losartan (COZAAR) 100 MG tablet, Take 1 tablet by mouth daily., Disp: 90 tablet, Rfl: 2   Phentermine-Topiramate 3.75-23 MG CP24, Take 1 capsule by mouth every morning., Disp: 30 capsule, Rfl: 0   amLODipine (NORVASC) 5 MG tablet, Take 1 tablet (5 mg total) by mouth daily., Disp: 90 tablet, Rfl: 2  Allergies  Allergen Reactions   Naltrexone-Bupropion Hcl Er Hypertension    Palpitations, hypertension    I personally reviewed active problem list, medication list, allergies, family history, social history, health maintenance, notes from last encounter, lab results, imaging with the patient/caregiver today.   Review of Systems  Constitutional: Negative.   HENT: Negative.    Eyes: Negative.   Respiratory: Negative.    Cardiovascular: Negative.   Gastrointestinal: Negative.   Endocrine: Negative.   Genitourinary: Negative.   Musculoskeletal: Negative.   Skin: Negative.   Allergic/Immunologic: Negative.   Neurological: Negative.   Hematological: Negative.  Psychiatric/Behavioral: Negative.    All other systems reviewed and are negative.     Objective:   Virtual encounter, vitals limited, only able to obtain the following There were no vitals filed for this visit. There is no height or weight on file to calculate BMI. Nursing Note and Vital Signs reviewed.  Physical Exam Vitals and nursing note reviewed.  Constitutional:      General: She is not in acute distress.     Appearance: She is obese. She is not ill-appearing, toxic-appearing or diaphoretic.  Pulmonary:     Effort: No respiratory distress.  Neurological:     Mental Status: She is alert.  Psychiatric:        Mood and Affect: Mood normal.        Behavior: Behavior normal.     PE limited by virtual encounter  No results found for this or any previous visit (from the past 72 hour(s)).  Assessment and Plan:     ICD-10-CM   1. Class 1 obesity with body mass index (BMI) of 30.0 to 30.9 in adult, unspecified obesity type, unspecified whether serious comorbidity present  E66.9 Phentermine-Topiramate 3.75-23 MG CP24   Z68.30 Amb ref to Medical Nutrition Therapy-[facility]    CANCELED: Amb ref to Medical Nutrition Therapy-[facility]   reviewed diet/lifestyle efforts - particularly working on calorie deficit and improved nutriiton to be able to loose weight - meds and referrals ordered    2. Encounter for weight management  Z76.89 Phentermine-Topiramate 3.75-23 MG CP24    Amb ref to Medical Nutrition Therapy-[facility]    CANCELED: Amb ref to Medical Nutrition Therapy-[facility]   pt cannot afford the med weight mgmt specialists - she will continue here with primary care - explained less resources and cannot see both    3. Hypertension, benign  I10 Amb ref to Medical Nutrition Therapy-[facility]    CANCELED: Amb ref to Medical Nutrition Therapy-[facility]    4. Obstructive sleep apnea  G47.33 Amb ref to Medical Nutrition Therapy-[facility]    CANCELED: Amb ref to Medical Nutrition Therapy-[facility]    5. Primary osteoarthritis of right knee  M17.11 Amb ref to Medical Nutrition Therapy-[facility]    CANCELED: Amb ref to Medical Nutrition Therapy-[facility]    6. Prediabetes  R73.03 Amb ref to Medical Nutrition Therapy-[facility]    CANCELED: Amb ref to Medical Nutrition Therapy-[facility]    7. Mixed hyperlipidemia  E78.2 Amb ref to Medical Nutrition Therapy-[facility]    CANCELED: Amb ref to Medical Nutrition Therapy-[facility]      1 month f/up in office with  weights/measurements/diet/nutrition logs to review and for med f/up    -Red flags and when to present for emergency care or RTC including fever >101.73F, chest pain, shortness of breath, new/worsening/un-resolving symptoms, reviewed with patient at time of visit. Follow up and care instructions discussed and provided in AVS. - I discussed the assessment and treatment plan with the patient. The patient was provided an opportunity to ask questions and all were answered. The patient agreed with the plan and demonstrated an understanding of the instructions.  I provided 20+ minutes of non-face-to-face time during this encounter.  Danelle Berry, PA-C 09/20/22 9:56 AM

## 2022-09-20 NOTE — Progress Notes (Deleted)
Patient ID: Andrea Pope, female    DOB: 06/25/1970, 52 y.o.   MRN: [id number]  PCP: Danelle Berry, PA-C  Chief Complaint  Patient presents with   Medication Problem    Would like to stop Wellbutrin due to side effects. 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    Subjective:   Andrea Pope is a 52 y.o. female, presents to clinic with CC of the following:  HPI     Patient Active Problem List   Diagnosis Date Noted   Spinal stenosis of lumbar region 11/28/2021   Prediabetes 12/04/2020   Mixed hyperlipidemia 12/04/2020   Hypertension, benign 12/03/2019   Fibrocystic breast changes, bilateral 12/03/2019   Mild episode of recurrent major depressive disorder (HCC) 03/26/2019   Obstructive sleep apnea 12/30/2018   Vitamin B12 deficiency 01/08/2018   Vitamin D deficiency 01/08/2018   Positive ANA (antinuclear antibody) 12/15/2017   Primary osteoarthritis of right knee 05/13/2017   Overweight (BMI 25.0-29.9) 11/19/2016   White coat syndrome with hypertension 01/25/2016   Allergic rhinitis, seasonal 01/10/2015   Asymptomatic varicose veins of both lower extremities 01/10/2015      Current Outpatient Medications:    celecoxib (CELEBREX) 200 MG capsule, Take 200 mg by mouth 2 (two) times daily., Disp: , Rfl:    Cholecalciferol (D3 PO), Take by mouth., Disp: , Rfl:    Difluprednate 0.05 % EMUL, Apply to eye., Disp: , Rfl:    estradiol-norethindrone (ACTIVELLA) 1-0.5 MG tablet, Take 1 tablet by mouth daily., Disp: 90 tablet, Rfl: 3   losartan (COZAAR) 100 MG tablet, Take 1 tablet by mouth daily., Disp: 90 tablet, Rfl: 2   amLODipine (NORVASC) 5 MG tablet, Take 1 tablet (5 mg total) by mouth daily., Disp: 90 tablet, Rfl: 2   famotidine (PEPCID) 20 MG tablet, Take 1 tablet (20 mg total) by mouth 2 (two) times daily as needed (nausea). (Patient not taking: Reported on 09/20/2022), Disp: 60 tablet, Rfl: 1   moxifloxacin (VIGAMOX) 0.5 % ophthalmic solution, Place 1 drop into the left  eye 3 (three) times daily. (Patient not taking: Reported on 09/20/2022), Disp: , Rfl:    ondansetron (ZOFRAN) 4 MG tablet, Take 1 tablet (4 mg total) by mouth every 8 (eight) hours as needed for refractory nausea / vomiting. (Patient not taking: Reported on 08/27/2022), Disp: 20 tablet, Rfl: 0   prednisoLONE acetate (PRED FORTE) 1 % ophthalmic suspension, Place 1 drop into the left eye 4 (four) times daily. (Patient not taking: Reported on 08/27/2022), Disp: , Rfl:    Allergies  Allergen Reactions   Naltrexone-Bupropion Hcl Er Hypertension    Palpitations, hypertension     Social History   Tobacco Use   Smoking status: Never   Smokeless tobacco: Never  Vaping Use   Vaping Use: Never used  Substance Use Topics   Alcohol use: Yes    Alcohol/week: 2.0 standard drinks of alcohol    Types: 2 Glasses of wine per week    Comment: twice a week   Drug use: No      Chart Review Today: I personally reviewed active problem list, medication list, allergies, family history, social history, health maintenance, notes from last encounter, lab results, imaging with the patient/caregiver today.   Review of Systems     Objective:   There were no vitals filed for this visit.  There is no height or weight on file to calculate BMI.  Physical Exam   Results for orders placed or performed during the  hospital encounter of 09/04/22  Basic metabolic panel  Result Value Ref Range   Sodium 135 135 - 145 mmol/L   Potassium 3.7 3.5 - 5.1 mmol/L   Chloride 104 98 - 111 mmol/L   CO2 21 (L) 22 - 32 mmol/L   Glucose, Bld 88 70 - 99 mg/dL   BUN 12 6 - 20 mg/dL   Creatinine, Ser 4.09 0.44 - 1.00 mg/dL   Calcium 9.1 8.9 - 81.1 mg/dL   GFR, Estimated >91 >47 mL/min   Anion gap 10 5 - 15  CBC  Result Value Ref Range   WBC 3.4 (L) 4.0 - 10.5 K/uL   RBC 4.47 3.87 - 5.11 MIL/uL   Hemoglobin 13.6 12.0 - 15.0 g/dL   HCT 82.9 56.2 - 13.0 %   MCV 89.9 80.0 - 100.0 fL   MCH 30.4 26.0 - 34.0 pg   MCHC 33.8  30.0 - 36.0 g/dL   RDW 86.5 78.4 - 69.6 %   Platelets 340 150 - 400 K/uL   nRBC 0.0 0.0 - 0.2 %  Troponin I (High Sensitivity)  Result Value Ref Range   Troponin I (High Sensitivity) 4 <18 ng/L       Assessment & Plan:   ***     Danelle Berry, PA-C 09/20/22 9:46 AM

## 2022-09-25 ENCOUNTER — Encounter: Payer: Self-pay | Admitting: Family Medicine

## 2022-10-03 ENCOUNTER — Other Ambulatory Visit: Payer: Self-pay | Admitting: Family Medicine

## 2022-10-03 DIAGNOSIS — Z1231 Encounter for screening mammogram for malignant neoplasm of breast: Secondary | ICD-10-CM

## 2022-10-10 ENCOUNTER — Encounter: Payer: Self-pay | Admitting: Dietician

## 2022-10-10 ENCOUNTER — Encounter: Payer: 59 | Attending: Family Medicine | Admitting: Dietician

## 2022-10-10 VITALS — Ht 68.0 in | Wt 194.0 lb

## 2022-10-10 DIAGNOSIS — E669 Obesity, unspecified: Secondary | ICD-10-CM | POA: Diagnosis present

## 2022-10-10 DIAGNOSIS — I10 Essential (primary) hypertension: Secondary | ICD-10-CM | POA: Diagnosis not present

## 2022-10-10 DIAGNOSIS — E663 Overweight: Secondary | ICD-10-CM

## 2022-10-10 DIAGNOSIS — R7303 Prediabetes: Secondary | ICD-10-CM | POA: Insufficient documentation

## 2022-10-10 DIAGNOSIS — E782 Mixed hyperlipidemia: Secondary | ICD-10-CM | POA: Diagnosis not present

## 2022-10-10 DIAGNOSIS — G4733 Obstructive sleep apnea (adult) (pediatric): Secondary | ICD-10-CM | POA: Insufficient documentation

## 2022-10-10 DIAGNOSIS — Z6830 Body mass index (BMI) 30.0-30.9, adult: Secondary | ICD-10-CM | POA: Insufficient documentation

## 2022-10-10 DIAGNOSIS — Z713 Dietary counseling and surveillance: Secondary | ICD-10-CM | POA: Diagnosis not present

## 2022-10-10 DIAGNOSIS — M1711 Unilateral primary osteoarthritis, right knee: Secondary | ICD-10-CM | POA: Insufficient documentation

## 2022-10-10 NOTE — Progress Notes (Signed)
Medical Nutrition Therapy  Appointment Start time:  0800  Appointment End time:  0900  Primary concerns today: Weight loss  Referral diagnosis: E66.9 - Obesity, I10 - HTN, E78.2 - Mixed HLD, Z76.89 - Encounter for weight management Preferred learning style: No preference indicated Learning readiness: Contemplating   NUTRITION ASSESSMENT   Anthropometrics  Ht: 5'8" Wt: 194 lbs  BMI: 29.50 kg/m2 Goal weight: 175 - 180 lbs  Clinical Medical Hx: HLD, HTN, Prediabetes, Overweight, Vit D deficiency,Vit B12 deficiency Medications: Amlodipine, Losartan Labs: A1c - 5.8% Notable Signs/Symptoms: N/A  Lifestyle & Dietary Hx Pt reports desire to lose weight.  Pt reports going to weight management recently, discontinued because of cost. Pt reports history of successful weight loss around 2 years through increased exercise and a dietary plan for a 6 week boot camp. Pt states they do not feel this was a sustainable lifestyle. Pt states they have DDD, Arthritis, knee pain that tend to get worse when their weight is up. Pt reports getting up from being seated on the floor can be painful. Pt states they are able to exercise their upper body. 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 Pt reports being active at work. Pt reports walking their dogs twice a day. Pt reports not being a big breakfast person, usually doesn't noticeable appetite until lunch time. Pt states they believe sugar is a problem for them, have a sweet tooth. 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   Estimated daily fluid intake: 64 oz Supplements: MVI, Biotin Sleep: Sleeps well Stress / self-care: Job is stressful, but manageable Current average weekly physical activity: Active at work,   24-Hr Dietary Recall First Meal: [employer] Grande iced coffee Snack: none Second Meal: Oceanographer, rice, peas, plantains Snack: none Third Meal: [employer], rice,  peas, plantains, 2 glasses of wine Snack: mini cupcakes Beverages: water, wine    NUTRITION DIAGNOSIS  NB-1.1 Food and nutrition-related knowledge deficit As related to overweight.  As evidenced by BMI of 29.50 kg/m2.   NUTRITION INTERVENTION  Nutrition education (E-1) on the following topics:  Educated patient on the calorie content and nutritional significance of all macronutrients, including fat, carbs, protein, and alcohol. Educated patient on the role of saturated fats on their cholesterol levels. Educated patient on importance of small, sustainable changes made over longer periods of time.  Handouts Provided Include  Saturated Fat worksheet  Learning Style & Readiness for Change Teaching method utilized: Visual & Auditory  Demonstrated degree of understanding via: Teach Back  Barriers to learning/adherence to lifestyle change: None identified  Goals Established by Pt Start your day with a source of protein. This can eggs, fat free dairy (milk, yogurt), or [place] sausage. Choose one of your types of candy that you eat during the day, and replace it with a sugar-free version to help lower your overall caloric intake from concentrated sugar. Use a combination of these three strategies to effectively lower your consumption of saturated fats/sugar 1) Consume a smaller portion when having foods higher in fats/sugars 2) Consume high fat/sugar less frequently 3) Find a reduced or low fat/sugar version   MONITORING & EVALUATION Dietary intake, weekly physical activity in 2 months.  Next Steps  Patient is to follow up with RD.

## 2022-10-10 NOTE — Patient Instructions (Addendum)
Start your day with a source of protein. This can eggs, fat free dairy (milk, yogurt), or [place] sausage.  Choose one of your types of candy that you eat during the day, and replace it with a sugar-free version to help lower your overall caloric intake from concentrated sugar.  Use a combination of these three strategies to effectively lower your consumption of saturated fats/sugar 1) Consume a smaller portion when having foods higher in fats/sugars 2) Consume high fat/sugar less frequently 3) Find a reduced or low fat/sugar version

## 2022-10-28 ENCOUNTER — Ambulatory Visit
Admission: RE | Admit: 2022-10-28 | Discharge: 2022-10-28 | Disposition: A | Discharge status: Home / Self Care | Payer: 59 | Source: Ambulatory Visit | Attending: Family Medicine | Admitting: Family Medicine

## 2022-10-28 DIAGNOSIS — Z1231 Encounter for screening mammogram for malignant neoplasm of breast: Secondary | ICD-10-CM | POA: Diagnosis present

## 2022-10-30 ENCOUNTER — Other Ambulatory Visit: Payer: Self-pay | Admitting: Family Medicine

## 2022-10-30 DIAGNOSIS — R928 Other abnormal and inconclusive findings on diagnostic imaging of breast: Secondary | ICD-10-CM

## 2022-10-30 DIAGNOSIS — N63 Unspecified lump in unspecified breast: Secondary | ICD-10-CM

## 2022-10-31 ENCOUNTER — Ambulatory Visit
Admission: RE | Admit: 2022-10-31 | Discharge: 2022-10-31 | Disposition: A | Discharge status: Home / Self Care | Payer: 59 | Source: Ambulatory Visit | Attending: Family Medicine | Admitting: Family Medicine

## 2022-10-31 DIAGNOSIS — N63 Unspecified lump in unspecified breast: Secondary | ICD-10-CM

## 2022-10-31 DIAGNOSIS — R928 Other abnormal and inconclusive findings on diagnostic imaging of breast: Secondary | ICD-10-CM | POA: Diagnosis present

## 2022-11-26 ENCOUNTER — Ambulatory Visit: Payer: 59 | Admitting: Family Medicine

## 2022-12-10 ENCOUNTER — Ambulatory Visit: Payer: 59 | Admitting: Dietician

## 2022-12-16 ENCOUNTER — Ambulatory Visit (INDEPENDENT_AMBULATORY_CARE_PROVIDER_SITE_OTHER): Payer: No Typology Code available for payment source | Admitting: Family Medicine

## 2022-12-16 ENCOUNTER — Encounter: Payer: Self-pay | Admitting: Family Medicine

## 2022-12-16 VITALS — BP 126/80 | HR 74 | Temp 98.8°F | Resp 16 | Ht 68.25 in | Wt 195.6 lb

## 2022-12-16 DIAGNOSIS — R7303 Prediabetes: Secondary | ICD-10-CM | POA: Diagnosis not present

## 2022-12-16 DIAGNOSIS — L659 Nonscarring hair loss, unspecified: Secondary | ICD-10-CM | POA: Diagnosis not present

## 2022-12-16 DIAGNOSIS — Z0000 Encounter for general adult medical examination without abnormal findings: Secondary | ICD-10-CM | POA: Diagnosis not present

## 2022-12-16 MED ORDER — LOSARTAN POTASSIUM 100 MG PO TABS: ORAL_TABLET | ORAL | 1 refills | Status: DC

## 2022-12-16 MED ORDER — AMLODIPINE BESYLATE 5 MG PO TABS: 5.0000 mg | ORAL_TABLET | Freq: Every day | ORAL | 1 refills | Status: DC

## 2022-12-16 NOTE — Progress Notes (Signed)
Patient: Andrea Pope, Female    DOB: 1970/08/20, 52 y.o.   MRN: [id number] Andrea Berry, PA-C Visit Date: 12/16/2022  Today's Provider: Danelle Berry, PA-C   Chief Complaint  Patient presents with   Annual Exam   Subjective:   Annual physical exam:  Andrea Pope is a 52 y.o. female who presents today for complete physical exam:  Exercise/Activity:  not exercising much (2 d 10 min), exercising more she's lost some weight  Diet/nutrition:  no fried food, cut back on sugar, push water  Sleep:  good sleep - no concerns  SDOH Screenings   Food Insecurity: Food Insecurity Present (08/19/2022)   Received from [facility], [facility]  Housing: Low Risk  (12/16/2022)  Transportation Needs: No Transportation Needs (08/19/2022)   Received from [facility], [facility]  Utilities: At Risk (08/19/2022)   Received from [facility], [facility]  Alcohol Screen: Low Risk  (12/16/2022)  Depression (PHQ2-9): Low Risk  (12/16/2022)  Financial Resource Strain: Medium Risk (08/19/2022)   Received from [facility], [facility]  Physical Activity: Insufficiently Active (08/19/2022)   Received from [facility], [facility]  Social Connections: Moderately Integrated (08/19/2022)   Received from [facility], [facility]  Stress: Stress Concern Present (08/19/2022)   Received from [facility], [facility]  Tobacco Use: Low Risk  (12/16/2022)  Health Literacy: Adequate Health Literacy (12/16/2022)     USPSTF grade A and B recommendations - reviewed and addressed today  Depression:  Phq 9 completed today by patient, was reviewed by me with patient in the room PHQ score is negative, pt feels mood is good    12/16/2022    1:40 PM 10/10/2022    8:06 AM 09/20/2022    9:06 AM 08/27/2022    3:43 PM  PHQ 2/9 Scores  PHQ - 2 Score 0 0 0 0  PHQ- 9 Score 0  0 0      12/16/2022    1:40 PM 10/10/2022    8:06 AM 09/20/2022    9:06 AM 08/27/2022    3:43 PM 07/01/2022   11:30  AM  Depression screen PHQ 2/9  Decreased Interest 0 0 0 0 0  Down, Depressed, Hopeless 0 0 0 0 0  PHQ - 2 Score 0 0 0 0 0  Altered sleeping 0  0 0 0  Tired, decreased energy 0  0 0 0  Change in appetite 0  0 0 0  Feeling bad or failure about yourself  0  0 0 0  Trouble concentrating 0  0 0 0  Moving slowly or fidgety/restless 0  0 0 0  Suicidal thoughts 0  0 0 0  PHQ-9 Score 0  0 0 0  Difficult doing work/chores Not difficult at all  Not difficult at all Not difficult at all Not difficult at all    Alcohol screening: Flowsheet Row Office Visit from 12/16/2022 in [facility]  AUDIT-C Score 1       Immunizations and Health Maintenance: Health Maintenance  Topic Date Due   INFLUENZA VACCINE  12/05/2022   Zoster Vaccines- Shingrix (1 of 2) 12/21/2022 (Originally 09/01/1991)   COVID-19 Vaccine (5 - 2023-24 season) 01/01/2023 (Originally 01/04/2022)   MAMMOGRAM  10/28/2023   PAP SMEAR-Modifier  07/16/2025   DTaP/Tdap/Td (2 - Td or Tdap) 12/06/2027   Colonoscopy  09/26/2030   Hepatitis C Screening  Completed   HIV Screening  Completed   HPV VACCINES  Aged  Out     Hep C Screening: done previously  STD testing and prevention (HIV/chl/gon/syphilis):  see above, no additional testing desired by pt today   Intimate partner violence:  safe at home, denies 3/4 kids at home  Sexual History/Pain during Intercourse: Married husband, no Sx or SE   Menstrual History/LMP/Abnormal Bleeding: denies No LMP recorded. Patient has had an ablation.  Incontinence Symptoms: urge and stress incontinence stable for years, she previously consulted with specialists   Breast cancer: just done - due for next annual screening in 1 year - reviwed Last Mammogram: *see HM list above  Cervical cancer screening: done, due in 2027  Osteoporosis:   Discussion on osteoporosis per age, including high calcium and vitamin D supplementation, weight bearing exercises Pt is taking vit  D - but just ran out  Skin cancer:  Hx of skin CA -  NO, none  Discussed atypical lesions   Colorectal cancer:   Colonoscopy is UTD Discussed concerning signs and sx of CRC, pt denies melena, hematochezia, change in BM pattern or caliber   Lung cancer:   Low Dose CT Chest recommended if Age 68-80 years, 20 pack-year currently smoking OR have quit w/in 15years. Patient does not qualify.    Social History   Tobacco Use   Smoking status: Never   Smokeless tobacco: Never  Vaping Use   Vaping status: Never Used  Substance Use Topics   Alcohol use: Yes    Alcohol/week: 2.0 standard drinks of alcohol    Types: 2 Glasses of wine per week    Comment: twice a week   Drug use: No     Flowsheet Row Office Visit from 12/16/2022 in [facility]  AUDIT-C Score 1       Family History  Problem Relation Age of Onset   Hypertension Mother    Thyroid disease Mother    Kidney disease Mother    Hypertension Father    Cancer Father        Prostate   Diabetes Brother    Diabetes Other    Cancer Paternal Uncle        Prostate   Diabetes Maternal Grandmother    Cancer Paternal Grandfather        Prostate   Diabetes Sister    Schizophrenia Sister    Breast cancer Neg Hx    Ovarian cancer Neg Hx    Heart disease Neg Hx      Blood pressure/Hypertension: BP Readings from Last 3 Encounters:  12/16/22 126/80  09/04/22 139/88  08/27/22 132/80    Weight/Obesity: Wt Readings from Last 3 Encounters:  12/16/22 195 lb 9.6 oz (88.7 kg)  10/10/22 194 lb (88 kg)  08/27/22 201 lb 1.6 oz (91.2 kg)   BMI Readings from Last 3 Encounters:  12/16/22 29.52 kg/m  10/10/22 29.50 kg/m  08/27/22 30.58 kg/m     Lipids:  Lab Results  Component Value Date   CHOL 239 (H) 12/10/2021   CHOL 216 (H) 12/04/2020   CHOL 249 (H) 12/23/2019   Lab Results  Component Value Date   HDL 68 12/10/2021   HDL 58 12/04/2020   HDL 58 12/23/2019   Lab Results  Component Value  Date   LDLCALC 152 (H) 12/10/2021   LDLCALC 138 (H) 12/04/2020   LDLCALC 169 (H) 12/23/2019   Lab Results  Component Value Date   TRIG 89 12/10/2021   TRIG 94 12/04/2020   TRIG 101 12/23/2019   Lab Results  Component Value Date   CHOLHDL 3.5 12/10/2021   CHOLHDL 3.7 12/04/2020   CHOLHDL 4.3 12/23/2019   No results found for: "LDLDIRECT" Based on the results of lipid panel his/her cardiovascular risk factor ( using [facility] )  in the next 10 years is: The 10-year ASCVD risk score (Arnett DK, et al., 2019) is: 2.8%   Values used to calculate the score:     Age: 50 years     Sex: Female     Is Non-Hispanic African American: Yes     Diabetic: No     Tobacco smoker: No     Systolic Blood Pressure: 126 mmHg     Is BP treated: Yes     HDL Cholesterol: 68 mg/dL     Total Cholesterol: 239 mg/dL  Glucose:  Glucose, Bld  Date Value Ref Range Status  09/04/2022 88 70 - 99 mg/dL Final    Comment:    Glucose reference range applies only to samples taken after fasting for at least 8 hours.  07/17/2022 97 65 - 99 mg/dL Final    Comment:    .            Fasting reference interval .   03/09/2022 111 (H) 70 - 99 mg/dL Final    Comment:    Glucose reference range applies only to samples taken after fasting for at least 8 hours.    Advanced Care Planning:  A voluntary discussion about advance care planning including the explanation and discussion of advance directives.   Discussed health care proxy and Living will, and the patient was able to identify a health care proxy as spouse.     Social History       Social History   Socioeconomic History   Marital status: Married    Spouse name: Ron    Number of children: 4   Years of education: Not on file   Highest education level: Bachelor's degree (e.g., BA, AB, BS)  Occupational History   Occupation: Teacher  Tobacco Use   Smoking status: Never   Smokeless tobacco: Never  Vaping Use   Vaping status: Never Used   Substance and Sexual Activity   Alcohol use: Yes    Alcohol/week: 2.0 standard drinks of alcohol    Types: 2 Glasses of wine per week    Comment: twice a week   Drug use: No   Sexual activity: Yes    Partners: Male    Birth control/protection: Surgical    Comment: abalation  Other Topics Concern   Not on file  Social History Narrative   Not on file   Social Determinants of Health   Financial Resource Strain: Medium Risk (08/19/2022)   Received from [facility], [facility]   Overall Financial Resource Strain (CARDIA)    Difficulty of Paying Living Expenses: Somewhat hard  Food Insecurity: Food Insecurity Present (08/19/2022)   Received from [facility], [facility]   Hunger Vital Sign    Worried About Running Out of Food in the Last Year: Sometimes true    Ran Out of Food in the Last Year: Sometimes true  Transportation Needs: No Transportation Needs (08/19/2022)   Received from [employer], [facility]   PRAPARE - Transportation    Lack of Transportation (Medical): No    Lack of Transportation (Non-Medical): No  Physical Activity: Insufficiently Active (08/19/2022)   Received from [facility], [facility]   Exercise Vital Sign    Days of Exercise per Week: 2  days    Minutes of Exercise per Session: 10 min  Stress: Stress Concern Present (08/19/2022)   Received from [facility], [facility] - Occupational Stress Questionnaire    Feeling of Stress : To some extent  Social Connections: Moderately Integrated (08/19/2022)   Received from [facility], [facility]   Social Network    How would you rate your social network (family, work, friends)?: Adequate participation with social networks    Family History        Family History  Problem Relation Age of Onset   Hypertension Mother    Thyroid disease Mother    Kidney disease Mother    Hypertension Father    Cancer Father        Prostate   Diabetes Brother     Diabetes Other    Cancer Paternal Uncle        Prostate   Diabetes Maternal Grandmother    Cancer Paternal Grandfather        Prostate   Diabetes Sister    Schizophrenia Sister    Breast cancer Neg Hx    Ovarian cancer Neg Hx    Heart disease Neg Hx     Patient Active Problem List   Diagnosis Date Noted   Spinal stenosis of lumbar region 11/28/2021   Prediabetes 12/04/2020   Mixed hyperlipidemia 12/04/2020   Hypertension, benign 12/03/2019   Fibrocystic breast changes, bilateral 12/03/2019   Mild episode of recurrent major depressive disorder (HCC) 03/26/2019   Obstructive sleep apnea 12/30/2018   Vitamin B12 deficiency 01/08/2018   Vitamin D deficiency 01/08/2018   Positive ANA (antinuclear antibody) 12/15/2017   Primary osteoarthritis of right knee 05/13/2017   Overweight (BMI 25.0-29.9) 11/19/2016   White coat syndrome with hypertension 01/25/2016   Allergic rhinitis, seasonal 01/10/2015   Asymptomatic varicose veins of both lower extremities 01/10/2015    Past Surgical History:  Procedure Laterality Date   CATARACT EXTRACTION W/ INTRAOCULAR LENS IMPLANT Left 03/26/2022   CHOLECYSTECTOMY N/A 01/19/2016   Procedure: LAPAROSCOPIC CHOLECYSTECTOMY WITH INTRAOPERATIVE CHOLANGIOGRAM;  Surgeon: Kieth Brightly, MD;  Location: [facility] ORS;  Service: General;  Laterality: N/A;   DILITATION & CURRETTAGE/HYSTROSCOPY WITH NOVASURE ABLATION N/A 03/24/2017   Procedure: DILATATION & CURETTAGE/HYSTEROSCOPY WITH NOVASURE ABLATION;  Surgeon: Herold Harms, MD;  Location: [facility] ORS;  Service: Gynecology;  Laterality: N/A;   laser surgery on eye Right 10/2016     Current Outpatient Medications:    celecoxib (CELEBREX) 200 MG capsule, Take 200 mg by mouth 2 (two) times daily., Disp: , Rfl:    Cholecalciferol (D3 PO), Take by mouth., Disp: , Rfl:    estradiol-norethindrone (ACTIVELLA) 1-0.5 MG tablet, Take 1 tablet by mouth daily., Disp: 90 tablet, Rfl: 3   SEMAGLUTIDE,0.25  OR 0.5MG /DOS, [place], Inject into the skin., Disp: , Rfl:    amLODipine (NORVASC) 5 MG tablet, Take 1 tablet (5 mg total) by mouth daily., Disp: 90 tablet, Rfl: 1   losartan (COZAAR) 100 MG tablet, Take 1 tablet by mouth daily., Disp: 90 tablet, Rfl: 1  Allergies  Allergen Reactions   Naltrexone-Bupropion Hcl Er Hypertension    Palpitations, hypertension    Patient Care Team: Andrea Berry, PA-C as PCP - General (Family Medicine) Debbe Odea, MD as PCP - Cardiology (Cardiology) Sherie Don Janit Bern, MD as Attending Physician (Family Medicine) Kieth Brightly, MD (General Surgery)   Chart Review: I personally reviewed active problem list, medication list, allergies, family history, social history,  health maintenance, notes from last encounter, lab results, imaging with the patient/caregiver today.   Review of Systems  Constitutional: Negative.   HENT: Negative.    Eyes: Negative.   Respiratory: Negative.    Cardiovascular: Negative.   Gastrointestinal: Negative.   Endocrine: Negative.   Genitourinary: Negative.   Musculoskeletal: Negative.   Skin: Negative.   Allergic/Immunologic: Negative.   Neurological: Negative.   Hematological: Negative.   Psychiatric/Behavioral: Negative.    All other systems reviewed and are negative.         Objective:   Vitals:  Vitals:   12/16/22 1348  BP: 126/80  Pulse: 74  Resp: 16  Temp: 98.8 F (37.1 C)  TempSrc: Oral  SpO2: 99%  Weight: 195 lb 9.6 oz (88.7 kg)  Height: 5' 8.25" (1.734 m)    Body mass index is 29.52 kg/m.  Physical Exam Vitals and nursing note reviewed.  Constitutional:      General: She is not in acute distress.    Appearance: Normal appearance. She is well-developed. She is not ill-appearing, toxic-appearing or diaphoretic.  HENT:     Head: Normocephalic and atraumatic.     Right Ear: Tympanic membrane, ear canal and external ear normal. There is no impacted cerumen.     Left Ear: Tympanic  membrane, ear canal and external ear normal. There is no impacted cerumen.     Nose: Nose normal.     Mouth/Throat:     Mouth: Mucous membranes are moist.     Pharynx: Oropharynx is clear.  Eyes:     General: No scleral icterus.       Right eye: No discharge.        Left eye: No discharge.     Conjunctiva/sclera: Conjunctivae normal.  Neck:     Trachea: No tracheal deviation.  Cardiovascular:     Rate and Rhythm: Normal rate and regular rhythm.     Pulses: Normal pulses.     Heart sounds: Normal heart sounds. No murmur heard.    No friction rub. No gallop.  Pulmonary:     Effort: Pulmonary effort is normal. No respiratory distress.     Breath sounds: Normal breath sounds. No stridor. No wheezing, rhonchi or rales.  Abdominal:     General: Bowel sounds are normal.     Palpations: Abdomen is soft.  Musculoskeletal:     Cervical back: Normal range of motion.     Right lower leg: No edema.     Left lower leg: No edema.  Lymphadenopathy:     Cervical: No cervical adenopathy.  Skin:    General: Skin is warm and dry.     Findings: No rash.  Neurological:     Mental Status: She is alert.     Motor: No abnormal muscle tone.     Coordination: Coordination normal.  Psychiatric:        Mood and Affect: Mood normal.        Behavior: Behavior normal.       Fall Risk:    12/16/2022    1:37 PM 10/10/2022    8:06 AM 09/20/2022    9:06 AM 08/27/2022    3:43 PM 07/01/2022   11:29 AM  Fall Risk   Falls in the past year? 0 0 0 0 0  Number falls in past yr: 0  0 0 0  Injury with Fall? 0  0 0 0  Risk for fall due to : No Fall Risks  No Fall Risks No  Fall Risks No Fall Risks  Follow up Falls prevention discussed;Education provided;Falls evaluation completed  Falls prevention discussed;Education provided;Falls evaluation completed Falls prevention discussed;Education provided;Falls evaluation completed Falls prevention discussed;Education provided;Falls evaluation completed    Functional  Status Survey: Is the patient deaf or have difficulty hearing?: No Does the patient have difficulty seeing, even when wearing glasses/contacts?: No Does the patient have difficulty concentrating, remembering, or making decisions?: No Does the patient have difficulty walking or climbing stairs?: No Does the patient have difficulty dressing or bathing?: No Does the patient have difficulty doing errands alone such as visiting a doctor's office or shopping?: No   Assessment & Plan:    CPE completed today  USPSTF grade A and B recommendations reviewed with patient; age-appropriate recommendations, preventive care, screening tests, etc discussed and encouraged; healthy living encouraged; see AVS for patient education given to patient  Discussed importance of 150 minutes of physical activity weekly, AHA exercise recommendations given to pt in AVS/handout  Discussed importance of healthy diet:  eating lean meats and proteins, avoiding trans fats and saturated fats, avoid simple sugars and excessive carbs in diet, eat 6 servings of fruit/vegetables daily and drink plenty of water and avoid sweet beverages.    Recommended pt to do annual eye exam and routine dental exams/cleanings  Depression, alcohol, fall screening completed as documented above and per flowsheets  Advance Care planning information and packet discussed and offered today, encouraged pt to discuss with family members/spouse/partner/friends and complete Advanced directive packet and bring copy to office   Reviewed Health Maintenance: Health Maintenance  Topic Date Due   INFLUENZA VACCINE  12/05/2022   Zoster Vaccines- Shingrix (1 of 2) 12/21/2022 (Originally 09/01/1991)   COVID-19 Vaccine (5 - 2023-24 season) 01/01/2023 (Originally 01/04/2022)   MAMMOGRAM  10/28/2023   PAP SMEAR-Modifier  07/16/2025   DTaP/Tdap/Td (2 - Td or Tdap) 12/06/2027   Colonoscopy  09/26/2030   Hepatitis C Screening  Completed   HIV Screening  Completed    HPV VACCINES  Aged Out    Immunizations: Immunization History  Administered Date(s) Administered   Influenza,inj,Quad PF,6+ Mos 01/25/2016, 12/26/2017, 02/24/2019, 03/14/2020   Influenza,inj,quad, With Preservative 02/04/2020   Influenza-Unspecified 03/07/2017, 05/06/2021   PFIZER(Purple Top)SARS-COV-2 Vaccination 12/13/2019, 01/03/2020, 06/11/2020   PPD Test 11/20/2015   Tdap 12/05/2017   Unspecified SARS-COV-2 Vaccination 06/06/2020   Vaccines:  HPV: up to at age 73 , ask insurance if age between 72-45  Shingrix: 23-64 yo and ask insurance if covered when patient above 43 yo Pneumonia: not indicated Flu: due when in stock - educated and discussed with patient.     ICD-10-CM   1. Adult general medical exam  Z00.00 CBC with Differential/Platelet    COMPLETE METABOLIC PANEL WITH GFR    Lipid panel    Hemoglobin A1c    2. Prediabetes  R73.03 Hemoglobin A1c          Andrea Berry, PA-C 12/16/22 5:35 PM  [facility]

## 2022-12-16 NOTE — Patient Instructions (Addendum)
Health Maintenance  Topic Date Due   Flu Shot  12/05/2022   Zoster (Shingles) Vaccine (1 of 2) 12/21/2022*   COVID-19 Vaccine (5 - 2023-24 season) 01/01/2023*   Mammogram  10/28/2023   Pap Smear  07/16/2025   DTaP/Tdap/Td vaccine (2 - Td or Tdap) 12/06/2027   Colon Cancer Screening  09/26/2030   Hepatitis C Screening  Completed   HIV Screening  Completed   HPV Vaccine  Aged Out  *Topic was postponed. The date shown is not the original due date.   Preventing Osteoporosis, Adult Osteoporosis is a condition that causes the bones to lose density. This means that the bones become thinner, and the normal spaces in bone tissue become larger. Low bone density can make the bones weak and cause them to break more easily. Osteoporosis cannot always be prevented, but you can take steps to lower your risk of developing this condition. How can this condition affect me? If you develop osteoporosis, you will be more likely to break bones in your wrist, spine, or hip. Even a minor accident or injury can be enough to break weak bones. The bones will also be slower to heal. Osteoporosis can cause other problems as well, such as a stooped posture or trouble with movement. Osteoporosis can occur with aging. As you get older, you may lose bone tissue more quickly, or it may be replaced more slowly. Osteoporosis is more likely to develop if you have poor nutrition or do not get enough calcium or vitamin D. Other lifestyle factors can also play a role. By eating a well-balanced diet and making lifestyle changes, you can help keep your bones strong and healthy, lowering your chances of developing osteoporosis. What can increase my risk? The following factors may make you more likely to develop osteoporosis: Having a family history of the condition. Having poor nutrition or not getting enough calcium or vitamin D. Using certain medicines, such as steroid medicines or anti-seizure medicines. Being any of the  following: 67 years of age or older. Female. A woman who has gone through menopause (is postmenopausal). A person who is of European or Asian descent. Using products that contain nicotine or tobacco, such as cigarettes, e-cigarettes, and chewing tobacco. Not being physically active (being sedentary). Having a small body frame. What actions can I take to prevent this? Get enough calcium  Make sure you get enough calcium every day. Calcium is the most important mineral for bone health. Most people can get enough calcium from their diet, but supplements may be recommended for people who are at risk for osteoporosis. Follow these guidelines: If you are age 71 or younger, aim to get 1,000 milligrams (mg) of calcium every day. If you are older than age 16, aim to get 1,200 mg of calcium every day. Good sources of calcium include: Dairy products, such as low-fat or nonfat milk, cheese, and yogurt. Dark green leafy vegetables, such as bok choy and broccoli. Foods that have had calcium added to them (calcium-fortified foods), such as orange juice, cereal, bread, soy beverages, and tofu products. Nuts, such as almonds. Check nutrition labels to see how much calcium is in a food or drink. Get enough vitamin D 3 Try to get enough vitamin D every day. Vitamin D is the most essential vitamin for bone health. It helps the body absorb calcium. Follow these guidelines for how much vitamin D to get from food:   1000-2000 international unit  If you are age 68 or younger, aim to  get at least 600 international units (IU) every day. Your health care provider may suggest more. If you are older than age 41, aim to get at least 800 international units every day. Your health care provider may suggest more. Good sources of vitamin D in your diet include: Egg yolks. Oily fish, such as salmon, sardines, and tuna. Milk and cereal fortified with vitamin D. Your body also makes vitamin D when you are out in the sun.  Exposing the bare skin on your face, arms, legs, or back to the sun for no more than 30 minutes a day, 2 times a week is more than enough. Beyond that, make sure you use sunblock to protect your skin from sunburn, which increases your risk for skin cancer. Exercise  Stay active and get exercise every day. Ask your health care provider what types of exercise are best for you. Weight-bearing and strength-building activities are important for building and maintaining healthy bones. Some examples of these types of activities include: Walking and hiking. Jogging and running. Dancing. Gym exercises and lifting weights. Tennis and racquetball. Climbing stairs. Tai chi. Make other lifestyle changes Do not use any products that contain nicotine or tobacco, such as cigarettes, e-cigarettes, and chewing tobacco. If you need help quitting, ask your health care provider. Lose weight if you are overweight. If you drink alcohol: Limit how much you use to: 0-1 drink a day for women who are not pregnant. 0-2 drinks a day for men. Be aware of how much alcohol is in your drink. In the [place] one drink equals one 12 oz bottle of beer (355 mL), one 5 oz glass of wine (148 mL), or one 1 oz glass of hard liquor (44 mL). Where to find support If you need help making changes to prevent osteoporosis, talk with your health care provider. You can ask for a referral to a dietitian and a physical therapist. Where to find more information Learn more about osteoporosis from: NIH Osteoporosis and Related Bone Diseases National Resource Center: www.bones.http://www.myers.net/ [place] Office on [employer] Health: http://hoffman.com/ National Osteoporosis Foundation: [employer] Summary Osteoporosis is a condition that causes weak bones that are more likely to break. Eat a healthy diet, making sure you get enough calcium and vitamin D, and stay active by getting regular exercise to help prevent osteoporosis. Other ways to reduce your risk  of osteoporosis include maintaining a healthy weight and avoiding alcohol and products that contain nicotine or tobacco. This information is not intended to replace advice given to you by your health care provider. Make sure you discuss any questions you have with your health care provider. Document Revised: 10/07/2019 Document Reviewed: 10/07/2019 Elsevier Patient Education  2024 [employer].  Preventive Care 5-45 Years Old, Female Preventive care refers to lifestyle choices and visits with your health care provider that can promote health and wellness. Preventive care visits are also called wellness exams. What can I expect for my preventive care visit? Counseling Your health care provider may ask you questions about your: Medical history, including: Past medical problems. Family medical history. Pregnancy history. Current health, including: Menstrual cycle. Method of birth control. Emotional well-being. Home life and relationship well-being. Sexual activity and sexual health. Lifestyle, including: Alcohol, nicotine or tobacco, and drug use. Access to firearms. Diet, exercise, and sleep habits. Work and work Astronomer. Sunscreen use. Safety issues such as seatbelt and bike helmet use. Physical exam Your health care provider will check your: Height and weight. These may be used to calculate your  BMI (body mass index). BMI is a measurement that tells if you are at a healthy weight. Waist circumference. This measures the distance around your waistline. This measurement also tells if you are at a healthy weight and may help predict your risk of certain diseases, such as type 2 diabetes and high blood pressure. Heart rate and blood pressure. Body temperature. Skin for abnormal spots. What immunizations do I need?  Vaccines are usually given at various ages, according to a schedule. Your health care provider will recommend vaccines for you based on your age, medical history, and  lifestyle or other factors, such as travel or where you work. What tests do I need? Screening Your health care provider may recommend screening tests for certain conditions. This may include: Lipid and cholesterol levels. Diabetes screening. This is done by checking your blood sugar (glucose) after you have not eaten for a while (fasting). Pelvic exam and Pap test. Hepatitis B test. Hepatitis C test. HIV (human immunodeficiency virus) test. STI (sexually transmitted infection) testing, if you are at risk. Lung cancer screening. Colorectal cancer screening. Mammogram. Talk with your health care provider about when you should start having regular mammograms. This may depend on whether you have a family history of breast cancer. BRCA-related cancer screening. This may be done if you have a family history of breast, ovarian, tubal, or peritoneal cancers. Bone density scan. This is done to screen for osteoporosis. Talk with your health care provider about your test results, treatment options, and if necessary, the need for more tests. Follow these instructions at home: Eating and drinking  Eat a diet that includes fresh fruits and vegetables, whole grains, lean protein, and low-fat dairy products. Take vitamin and mineral supplements as recommended by your health care provider. Do not drink alcohol if: Your health care provider tells you not to drink. You are pregnant, may be pregnant, or are planning to become pregnant. If you drink alcohol: Limit how much you have to 0-1 drink a day. Know how much alcohol is in your drink. In the [place] one drink equals one 12 oz bottle of beer (355 mL), one 5 oz glass of wine (148 mL), or one 1 oz glass of hard liquor (44 mL). Lifestyle Brush your teeth every morning and night with fluoride toothpaste. Floss one time each day. Exercise for at least 30 minutes 5 or more days each week. Do not use any products that contain nicotine or tobacco. These  products include cigarettes, chewing tobacco, and vaping devices, such as e-cigarettes. If you need help quitting, ask your health care provider. Do not use drugs. If you are sexually active, practice safe sex. Use a condom or other form of protection to prevent STIs. If you do not wish to become pregnant, use a form of birth control. If you plan to become pregnant, see your health care provider for a prepregnancy visit. Take aspirin only as told by your health care provider. Make sure that you understand how much to take and what form to take. Work with your health care provider to find out whether it is safe and beneficial for you to take aspirin daily. Find healthy ways to manage stress, such as: Meditation, yoga, or listening to music. Journaling. Talking to a trusted person. Spending time with friends and family. Minimize exposure to UV radiation to reduce your risk of skin cancer. Safety Always wear your seat belt while driving or riding in a vehicle. Do not drive: If you have been drinking  alcohol. Do not ride with someone who has been drinking. When you are tired or distracted. While texting. If you have been using any mind-altering substances or drugs. Wear a helmet and other protective equipment during sports activities. If you have firearms in your house, make sure you follow all gun safety procedures. Seek help if you have been physically or sexually abused. What's next? Visit your health care provider once a year for an annual wellness visit. Ask your health care provider how often you should have your eyes and teeth checked. Stay up to date on all vaccines. This information is not intended to replace advice given to you by your health care provider. Make sure you discuss any questions you have with your health care provider. Document Revised: 10/18/2020 Document Reviewed: 10/18/2020 Elsevier Patient Education  2024 [employer].

## 2022-12-17 NOTE — Addendum Note (Signed)
Addended by: Danelle Berry on: 12/17/2022 05:50 PM   Modules accepted: Orders

## 2023-03-26 ENCOUNTER — Ambulatory Visit: Payer: No Typology Code available for payment source | Admitting: Nurse Practitioner

## 2023-03-26 ENCOUNTER — Ambulatory Visit: Payer: No Typology Code available for payment source | Admitting: Physician Assistant

## 2023-03-26 ENCOUNTER — Encounter: Payer: Self-pay | Admitting: Nurse Practitioner

## 2023-03-26 ENCOUNTER — Other Ambulatory Visit: Payer: Self-pay

## 2023-03-26 VITALS — BP 128/72 | HR 75 | Temp 98.3°F | Resp 16 | Wt 186.7 lb

## 2023-03-26 DIAGNOSIS — R350 Frequency of micturition: Secondary | ICD-10-CM

## 2023-03-26 DIAGNOSIS — R631 Polydipsia: Secondary | ICD-10-CM

## 2023-03-26 DIAGNOSIS — R7303 Prediabetes: Secondary | ICD-10-CM

## 2023-03-26 DIAGNOSIS — R3589 Other polyuria: Secondary | ICD-10-CM

## 2023-03-26 DIAGNOSIS — Z23 Encounter for immunization: Secondary | ICD-10-CM | POA: Diagnosis not present

## 2023-03-26 LAB — POCT URINALYSIS DIPSTICK
Bilirubin, UA: NEGATIVE
Glucose, UA: NEGATIVE
Ketones, UA: NEGATIVE
Nitrite, UA: NEGATIVE
Protein, UA: POSITIVE — AB
Spec Grav, UA: 1.02 (ref 1.010–1.025)
Urobilinogen, UA: 0.2 U/dL
pH, UA: 5 (ref 5.0–8.0)

## 2023-03-26 LAB — POCT GLYCOSYLATED HEMOGLOBIN (HGB A1C): Hemoglobin A1C: 5.7 % — AB (ref 4.0–5.6)

## 2023-03-26 MED ORDER — CEPHALEXIN 500 MG PO CAPS
500.0000 mg | ORAL_CAPSULE | Freq: Two times a day (BID) | ORAL | 0 refills | Status: AC
Start: 2023-03-26 — End: 2023-03-31

## 2023-03-26 MED ORDER — METFORMIN HCL 500 MG PO TABS
500.0000 mg | ORAL_TABLET | Freq: Every day | ORAL | 0 refills | Status: DC
Start: 2023-03-26 — End: 2023-04-21

## 2023-03-26 NOTE — Progress Notes (Signed)
BP 128/72   Pulse 75   Temp 98.3 F (36.8 C) (Oral)   Resp 16   Wt 186 lb 11.2 oz (84.7 kg)   SpO2 94%   BMI 28.18 kg/m    Subjective:    Patient ID: Andrea Pope, female    DOB: 1971/02/24, 52 y.o.   MRN: [id number]  HPI: Andrea Pope is a 52 y.o. female  Chief Complaint  Patient presents with   Urinary Frequency   dry mouth   Discussed the use of AI scribe software for clinical note transcription with the patient, who gave verbal consent to proceed.  History of Present Illness   The patient, with a history of cataract surgery, presents with a three week history of polyuria and polydipsia. She reports frequent urination, to the point of needing to keep a cup in her car for emergencies. She also reports dry mouth despite frequent urination. She has been monitoring her blood sugar at home, which has been consistently around 100. She has not noticed any pain with urination or fevers. She has occasional back pain, but it does not seem to be associated with the urinary symptoms. She has not noticed any vaginal discharge. She has not been diagnosed with diabetes in the past, but her A1c has been in the prediabetic range.    Relevant past medical, surgical, family and social history reviewed and updated as indicated. Interim medical history since our last visit reviewed. Allergies and medications reviewed and updated.  Review of Systems  Ten systems reviewed and is negative except as mentioned in HPI      Objective:    BP 128/72   Pulse 75   Temp 98.3 F (36.8 C) (Oral)   Resp 16   Wt 186 lb 11.2 oz (84.7 kg)   SpO2 94%   BMI 28.18 kg/m   Wt Readings from Last 3 Encounters:  03/26/23 186 lb 11.2 oz (84.7 kg)  12/16/22 195 lb 9.6 oz (88.7 kg)  10/10/22 194 lb (88 kg)    Physical Exam  Constitutional: Patient appears well-developed and well-nourished.  No distress.  HEENT: head atraumatic, normocephalic, pupils equal and reactive to light, neck  supple Cardiovascular: Normal rate, regular rhythm and normal heart sounds.  No murmur heard. No BLE edema. Pulmonary/Chest: Effort normal and breath sounds normal. No respiratory distress. Abdominal: Soft.  There is no tenderness. No CVA tenderness Psychiatric: Patient has a normal mood and affect. behavior is normal. Judgment and thought content normal.  Results for orders placed or performed in visit on 03/26/23  POCT urinalysis dipstick  Result Value Ref Range   Color, UA yellow    Clarity, UA clear    Glucose, UA Negative Negative   Bilirubin, UA negative    Ketones, UA negative    Spec Grav, UA 1.020 1.010 - 1.025   Blood, UA small    pH, UA 5.0 5.0 - 8.0   Protein, UA Positive (A) Negative   Urobilinogen, UA 0.2 0.2 or 1.0 E.U./dL   Nitrite, UA negative    Leukocytes, UA Small (1+) (A) Negative   Appearance clear    Odor none   POCT HgB A1C  Result Value Ref Range   Hemoglobin A1C 5.7 (A) 4.0 - 5.6 %   HbA1c POC (<> result, manual entry)     HbA1c, POC (prediabetic range)     HbA1c, POC (controlled diabetic range)        Assessment & Plan:   Problem List  Items Addressed This Visit       Other   Prediabetes   Relevant Medications   metFORMIN (GLUCOPHAGE) 500 MG tablet   Other Visit Diagnoses     Urinary frequency    -  Primary   Relevant Medications   cephALEXin (KEFLEX) 500 MG capsule   Other Relevant Orders   POCT urinalysis dipstick (Completed)   Urine Culture   POCT HgB A1C (Completed)   Need for influenza vaccination       Relevant Orders   Flu vaccine trivalent PF, 6mos and older(Flulaval,Afluria,Fluarix,Fluzone) (Completed)   Polydipsia       Relevant Medications   metFORMIN (GLUCOPHAGE) 500 MG tablet   Polyuria       Relevant Medications   metFORMIN (GLUCOPHAGE) 500 MG tablet   cephALEXin (KEFLEX) 500 MG capsule      Assessment and Plan    Urinary Frequency Increased urinary frequency for the past three weeks. Urinalysis showed small  blood, protein, and leukocytes. No dysuria, fever, or significant back pain. -Send urine for culture and await results. -Start Keflex 500mg  BID for 5 days presumptively for possible UTI. -Advise patient to seek emergency care if develops fever or back pain.  Prediabetes A1c 5.7, consistent with prediabetes. Patient has been in this range for about a year. -Start Metformin 500mg  daily to prevent progression to diabetes. -Advise patient to cut back on sugar intake.  General Health Maintenance -Administer influenza vaccine today.         Follow up plan: Return if symptoms worsen or fail to improve.

## 2023-03-27 LAB — URINE CULTURE
MICRO NUMBER:: 15758864
Result:: NO GROWTH
SPECIMEN QUALITY:: ADEQUATE

## 2023-03-28 ENCOUNTER — Encounter: Payer: Self-pay | Admitting: Nurse Practitioner

## 2023-04-02 ENCOUNTER — Other Ambulatory Visit (HOSPITAL_COMMUNITY)
Admission: RE | Admit: 2023-04-02 | Discharge: 2023-04-02 | Disposition: A | Discharge status: Home / Self Care | Payer: No Typology Code available for payment source | Source: Ambulatory Visit | Attending: Nurse Practitioner | Admitting: Nurse Practitioner

## 2023-04-02 ENCOUNTER — Other Ambulatory Visit: Payer: Self-pay

## 2023-04-02 ENCOUNTER — Ambulatory Visit (INDEPENDENT_AMBULATORY_CARE_PROVIDER_SITE_OTHER): Payer: No Typology Code available for payment source | Admitting: Nurse Practitioner

## 2023-04-02 ENCOUNTER — Encounter: Payer: Self-pay | Admitting: Nurse Practitioner

## 2023-04-02 ENCOUNTER — Other Ambulatory Visit: Payer: Self-pay | Admitting: Nurse Practitioner

## 2023-04-02 VITALS — BP 110/72 | HR 68 | Temp 98.1°F | Resp 16 | Ht 68.0 in | Wt 186.7 lb

## 2023-04-02 DIAGNOSIS — N898 Other specified noninflammatory disorders of vagina: Secondary | ICD-10-CM | POA: Insufficient documentation

## 2023-04-02 DIAGNOSIS — R631 Polydipsia: Secondary | ICD-10-CM

## 2023-04-02 DIAGNOSIS — R3589 Other polyuria: Secondary | ICD-10-CM | POA: Diagnosis not present

## 2023-04-02 DIAGNOSIS — R350 Frequency of micturition: Secondary | ICD-10-CM | POA: Diagnosis present

## 2023-04-02 LAB — POCT URINALYSIS DIPSTICK
Bilirubin, UA: NEGATIVE
Blood, UA: NEGATIVE
Glucose, UA: NEGATIVE
Ketones, UA: NEGATIVE
Nitrite, UA: NEGATIVE
Odor: NORMAL
Protein, UA: NEGATIVE
Spec Grav, UA: 1.02 (ref 1.010–1.025)
Urobilinogen, UA: 0.2 U/dL
pH, UA: 6 (ref 5.0–8.0)

## 2023-04-02 NOTE — Progress Notes (Signed)
BP 110/72   Pulse 68   Temp 98.1 F (36.7 C) (Oral)   Resp 16   Ht 5\' 8"  (1.727 m)   Wt 186 lb 11.2 oz (84.7 kg)   SpO2 99%   BMI 28.39 kg/m    Subjective:    Patient ID: Andrea Pope, female    DOB: March 26, 1971, 52 y.o.   MRN: [id number]  HPI: Andrea Pope is a 52 y.o. female  No chief complaint on file.  Discussed the use of AI scribe software for clinical note transcription with the patient, who gave verbal consent to proceed.  History of Present Illness   The patient, with a history of prediabetes managed with metformin, presents with a four week history of polyuria and polydipsia. previously saw patient and did urine culture which was negative.  A1C was done which was 5.7. The symptoms have improved slightly over the past week. She also reports vaginal itching, but denies noticing any discharge. She also notes that she has read that metformin can cause yeast infections.   Relevant past medical, surgical, family and social history reviewed and updated as indicated. Interim medical history since our last visit reviewed. Allergies and medications reviewed and updated.  Review of Systems  Ten systems reviewed and is negative except as mentioned in HPI       Objective:    BP 110/72   Pulse 68   Temp 98.1 F (36.7 C) (Oral)   Resp 16   Ht 5\' 8"  (1.727 m)   Wt 186 lb 11.2 oz (84.7 kg)   SpO2 99%   BMI 28.39 kg/m   Wt Readings from Last 3 Encounters:  04/02/23 186 lb 11.2 oz (84.7 kg)  03/26/23 186 lb 11.2 oz (84.7 kg)  12/16/22 195 lb 9.6 oz (88.7 kg)    Physical Exam  Constitutional: Patient appears well-developed and well-nourished. Obese  No distress.  HEENT: head atraumatic, normocephalic, pupils equal and reactive to light, neck supple Cardiovascular: Normal rate, regular rhythm and normal heart sounds.  No murmur heard. No BLE edema. Pulmonary/Chest: Effort normal and breath sounds normal. No respiratory distress. Abdominal: Soft.  There is no  tenderness. Psychiatric: Patient has a normal mood and affect. behavior is normal. Judgment and thought content normal.  Results for orders placed or performed in visit on 04/02/23  POCT urinalysis dipstick  Result Value Ref Range   Color, UA yellow    Clarity, UA clear    Glucose, UA Negative Negative   Bilirubin, UA neg    Ketones, UA neg    Spec Grav, UA 1.020 1.010 - 1.025   Blood, UA neg    pH, UA 6.0 5.0 - 8.0   Protein, UA Negative Negative   Urobilinogen, UA 0.2 0.2 or 1.0 E.U./dL   Nitrite, UA neg    Leukocytes, UA Large (3+) (A) Negative   Appearance yellow    Odor normal       Assessment & Plan:   Problem List Items Addressed This Visit   None Visit Diagnoses     Urinary frequency    -  Primary   Relevant Orders   POCT urinalysis dipstick (Completed)   Cervicovaginal ancillary only   Urine Culture   Vaginal discharge       Relevant Orders   Cervicovaginal ancillary only   Polydipsia       Relevant Orders   CBC with Differential/Platelet   COMPLETE METABOLIC PANEL WITH GFR   TSH   POCT  urinalysis dipstick (Completed)   Cervicovaginal ancillary only   Urine Culture   Urea Nitrogen, Urine   Osmolality, urine   Chloride, urine, random   Creatinine, Urine   Potassium, urine, random   Sodium, urine, random   Polyuria       Relevant Orders   CBC with Differential/Platelet   COMPLETE METABOLIC PANEL WITH GFR   TSH   POCT urinalysis dipstick (Completed)   Cervicovaginal ancillary only   Urine Culture   Urea Nitrogen, Urine   Osmolality, urine   Chloride, urine, random   Creatinine, Urine   Potassium, urine, random   Sodium, urine, random      Assessment and Plan    Polyuria and Polydipsia Ongoing for 4 weeks. A1c was 5.7. Urine dipstick positive for leukocytes, but culture negative at last appointment.  today her urine dipstick was positive for large leukocytes.  will send for culture.  Plan to conduct more intensive urine tests to identify the  cause. -Collect 24-hour urine sample. -Repeat urine culture. -Order complete blood count, TSH and metabolic panel.  Vaginal Itching Possible yeast infection due to Metformin use. No noticeable discharge. -Perform vaginal swab. -Continue Metformin as prescribed.         Follow up plan: Return if symptoms worsen or fail to improve.

## 2023-04-03 LAB — URINE CULTURE
MICRO NUMBER:: 15789212
Result:: NO GROWTH
SPECIMEN QUALITY:: ADEQUATE

## 2023-04-04 LAB — CERVICOVAGINAL ANCILLARY ONLY
Bacterial Vaginitis (gardnerella): POSITIVE — AB
Candida Glabrata: NEGATIVE
Candida Vaginitis: POSITIVE — AB
Chlamydia: NEGATIVE
Comment: NEGATIVE
Comment: NEGATIVE
Comment: NEGATIVE
Comment: NEGATIVE
Comment: NORMAL
Neisseria Gonorrhea: NEGATIVE

## 2023-04-05 LAB — CBC WITH DIFFERENTIAL/PLATELET
Absolute Lymphocytes: 2137 {cells}/uL (ref 850–3900)
Absolute Monocytes: 331 {cells}/uL (ref 200–950)
Basophils Absolute: 22 {cells}/uL (ref 0–200)
Basophils Relative: 0.5 %
Eosinophils Absolute: 90 {cells}/uL (ref 15–500)
Eosinophils Relative: 2.1 %
HCT: 39.1 % (ref 35.0–45.0)
Hemoglobin: 13 g/dL (ref 11.7–15.5)
MCH: 30.6 pg (ref 27.0–33.0)
MCHC: 33.2 g/dL (ref 32.0–36.0)
MCV: 92 fL (ref 80.0–100.0)
MPV: 11 fL (ref 7.5–12.5)
Monocytes Relative: 7.7 %
Neutro Abs: 1720 {cells}/uL (ref 1500–7800)
Neutrophils Relative %: 40 %
Platelets: 355 10*3/uL (ref 140–400)
RBC: 4.25 10*6/uL (ref 3.80–5.10)
RDW: 12.3 % (ref 11.0–15.0)
Total Lymphocyte: 49.7 %
WBC: 4.3 10*3/uL (ref 3.8–10.8)

## 2023-04-05 LAB — OSMOLALITY, URINE: Osmolality, Ur: 838 mosm/kg (ref 50–1200)

## 2023-04-05 LAB — CREATININE, URINE, RANDOM: Creatinine, Urine: 180 mg/dL (ref 20–275)

## 2023-04-05 LAB — COMPLETE METABOLIC PANEL WITH GFR
AG Ratio: 1.5 (calc) (ref 1.0–2.5)
ALT: 12 U/L (ref 6–29)
AST: 13 U/L (ref 10–35)
Albumin: 4.1 g/dL (ref 3.6–5.1)
Alkaline phosphatase (APISO): 52 U/L (ref 37–153)
BUN: 10 mg/dL (ref 7–25)
CO2: 25 mmol/L (ref 20–32)
Calcium: 9.2 mg/dL (ref 8.6–10.4)
Chloride: 104 mmol/L (ref 98–110)
Creat: 0.9 mg/dL (ref 0.50–1.03)
Globulin: 2.8 g/dL (ref 1.9–3.7)
Glucose, Bld: 78 mg/dL (ref 65–99)
Potassium: 4.2 mmol/L (ref 3.5–5.3)
Sodium: 138 mmol/L (ref 135–146)
Total Bilirubin: 0.4 mg/dL (ref 0.2–1.2)
Total Protein: 6.9 g/dL (ref 6.1–8.1)
eGFR: 78 mL/min/{1.73_m2} (ref >=60)

## 2023-04-05 LAB — SODIUM, URINE, RANDOM: Sodium, Ur: 162 mmol/L (ref 28–272)

## 2023-04-05 LAB — TSH: TSH: 0.7 m[IU]/L

## 2023-04-05 LAB — POTASSIUM, URINE, RANDOM: Potassium Urine: 67 mmol/L (ref 12–129)

## 2023-04-05 LAB — CHLORIDE, URINE, RANDOM: Chloride Urine: 197 mmol/L (ref 32–290)

## 2023-04-05 LAB — UREA NITROGEN, URINE: Urea Nitrogen, Ur: 951 mg/dL

## 2023-04-05 LAB — GLUCOSE, QUALITATIVE, URINE: Glucose, UA: NEGATIVE

## 2023-04-06 ENCOUNTER — Other Ambulatory Visit: Payer: Self-pay | Admitting: Nurse Practitioner

## 2023-04-07 ENCOUNTER — Other Ambulatory Visit: Payer: Self-pay | Admitting: Nurse Practitioner

## 2023-04-07 DIAGNOSIS — B9689 Other specified bacterial agents as the cause of diseases classified elsewhere: Secondary | ICD-10-CM

## 2023-04-07 DIAGNOSIS — B379 Candidiasis, unspecified: Secondary | ICD-10-CM

## 2023-04-07 MED ORDER — METRONIDAZOLE 500 MG PO TABS: 500.0000 mg | ORAL_TABLET | Freq: Two times a day (BID) | ORAL | 0 refills | Status: AC

## 2023-04-07 MED ORDER — FLUCONAZOLE 150 MG PO TABS: 150.0000 mg | ORAL_TABLET | ORAL | 0 refills | Status: DC | PRN

## 2023-04-09 LAB — POTASSIUM, URINE, 24 HOUR: Potassium, 24H Ur: 64 mmol/(24.h) (ref 22–160)

## 2023-04-09 LAB — CHLORIDE, 24-HOUR URINE WITH CREATININE
Chloride, 24H Ur: 106 mmol/(24.h) (ref 31–260)
Chloride/Creat: 63 mmol/g{creat} (ref 50–200)
Creatinine, 24H Ur: 1.68 g/(24.h) (ref 0.50–2.15)

## 2023-04-09 LAB — UREA NITROGEN (UUN), 24 HR URINE: Urea Nitrogen, 24H Ur: 8 g/(24.h) (ref 6–17)

## 2023-04-09 LAB — SODIUM, URINE, TIMED: Sodium, 24H Ur: 84 mmol/(24.h) (ref 52–380)

## 2023-04-19 ENCOUNTER — Other Ambulatory Visit: Payer: Self-pay | Admitting: Nurse Practitioner

## 2023-04-19 DIAGNOSIS — R3589 Other polyuria: Secondary | ICD-10-CM

## 2023-04-19 DIAGNOSIS — R7303 Prediabetes: Secondary | ICD-10-CM

## 2023-04-19 DIAGNOSIS — R631 Polydipsia: Secondary | ICD-10-CM

## 2023-04-21 NOTE — Telephone Encounter (Signed)
Requested Prescriptions  Pending Prescriptions Disp Refills   metFORMIN (GLUCOPHAGE) 500 MG tablet [Pharmacy Med Name: metFORMIN HCl 500 MG Oral Tablet] 30 tablet 0    Sig: Take 1 tablet by mouth once daily with breakfast     Endocrinology:  Diabetes - Biguanides Passed - 04/21/2023 12:03 PM      Passed - Cr in normal range and within 360 days    Creat  Date Value Ref Range Status  04/02/2023 0.90 0.50 - 1.03 mg/dL Final   Creatinine, Urine  Date Value Ref Range Status  04/02/2023 180 20 - 275 mg/dL Final         Passed - HBA1C is between 0 and 7.9 and within 180 days    Hemoglobin A1C  Date Value Ref Range Status  03/26/2023 5.7 (A) 4.0 - 5.6 % Final   Hgb A1c MFr Bld  Date Value Ref Range Status  12/16/2022 5.6 <5.7 % of total Hgb Final    Comment:    For the purpose of screening for the presence of diabetes: . <5.7%       Consistent with the absence of diabetes 5.7-6.4%    Consistent with increased risk for diabetes             (prediabetes) > or =6.5%  Consistent with diabetes . This assay result is consistent with a decreased risk of diabetes. . Currently, no consensus exists regarding use of hemoglobin A1c for diagnosis of diabetes in children. . According to American Diabetes Association (ADA) guidelines, hemoglobin A1c <7.0% represents optimal control in non-pregnant diabetic patients. Different metrics may apply to specific patient populations.  Standards of Medical Care in Diabetes(ADA). .          Passed - eGFR in normal range and within 360 days    GFR, Est African American  Date Value Ref Range Status  12/23/2019 91 > OR = 60 mL/min/1.31m2 Final   GFR, Est Non African American  Date Value Ref Range Status  12/23/2019 78 > OR = 60 mL/min/1.22m2 Final   GFR, Estimated  Date Value Ref Range Status  09/04/2022 >60 >60 mL/min Final    Comment:    (NOTE) Calculated using the CKD-EPI Creatinine Equation (2021)    eGFR  Date Value Ref Range  Status  04/02/2023 78 > OR = 60 mL/min/1.41m2 Final         Passed - B12 Level in normal range and within 720 days    Vitamin B-12  Date Value Ref Range Status  07/17/2022 361 200 - 1,100 pg/mL Final    Comment:    . Please Note: Although the reference range for vitamin B12 is [phone] pg/mL, it has been reported that between 5 and 10% of patients with values between 200 and 400 pg/mL may experience neuropsychiatric and hematologic abnormalities due to occult B12 deficiency; less than 1% of patients with values above 400 pg/mL will have symptoms. Verna Czech - Valid encounter within last 6 months    Recent Outpatient Visits           2 weeks ago Urinary frequency   [facility] Berniece Salines, FNP   3 weeks ago Urinary frequency   [facility] Berniece Salines, FNP   4 months ago Adult general medical exam   [facility] Danelle Berry, PA-C   7 months ago Class 1 obesity with body mass  index (BMI) of 30.0 to 30.9 in adult, unspecified obesity type, unspecified whether serious comorbidity present   [facility] Danelle Berry, PA-C   7 months ago Class 1 obesity with body mass index (BMI) of 30.0 to 30.9 in adult, unspecified obesity type, unspecified whether serious comorbidity present   [facility] Danelle Berry, PA-C              Passed - CBC within normal limits and completed in the last 12 months    WBC  Date Value Ref Range Status  04/02/2023 4.3 3.8 - 10.8 Thousand/uL Final   RBC  Date Value Ref Range Status  04/02/2023 4.25 3.80 - 5.10 Million/uL Final   Hemoglobin  Date Value Ref Range Status  04/02/2023 13.0 11.7 - 15.5 g/dL Final  40/98/1191 47.8 11.1 - 15.9 g/dL Final   HCT  Date Value Ref Range Status  04/02/2023 39.1 35.0 - 45.0 % Final   Hematocrit  Date Value Ref Range Status  01/28/2017 37.8 34.0 - 46.6 % Final    MCHC  Date Value Ref Range Status  04/02/2023 33.2 32.0 - 36.0 g/dL Final    Comment:    For adults, a slight decrease in the calculated MCHC value (in the range of 30 to 32 g/dL) is most likely not clinically significant; however, it should be interpreted with caution in correlation with other red cell parameters and the patient's clinical condition.    [facility]  Date Value Ref Range Status  04/02/2023 30.6 27.0 - 33.0 pg Final   MCV  Date Value Ref Range Status  04/02/2023 92.0 80.0 - 100.0 fL Final  01/28/2017 91 79 - 97 fL Final   No results found for: "PLTCOUNTKUC", "LABPLAT", "POCPLA" RDW  Date Value Ref Range Status  04/02/2023 12.3 11.0 - 15.0 % Final  01/28/2017 13.7 12.3 - 15.4 % Final

## 2023-06-07 ENCOUNTER — Other Ambulatory Visit: Payer: Self-pay

## 2023-06-07 DIAGNOSIS — H5711 Ocular pain, right eye: Secondary | ICD-10-CM | POA: Diagnosis present

## 2023-06-07 NOTE — ED Triage Notes (Signed)
Pt reports that she has had burning and photophobia in right eye x 1 day. Denies visual disturbance. No headache. NO drainage.

## 2023-06-07 NOTE — ED Provider Triage Note (Signed)
Emergency Medicine Provider Triage Evaluation Note  Andrea Pope , a 53 y.o. female  was evaluated in triage.  Pt complains of bilateral eye redness and pain of the right eye over upper lid x 2 days. Has tried eye drops with no relief. Denies grittiness.   Review of Systems  Positive: Negative: Fever, visual blurriness, itchiness, discharge  Physical Exam  BP 106/76   Pulse 66   Temp 98.8 F (37.1 C) (Oral)   Resp 15   SpO2 100%  Gen:   Awake, no distress   Resp:  Normal effort  MSK:   Moves extremities without difficulty  Other:    Medical Decision Making  Medically screening exam initiated at 9:41 PM.  Appropriate orders placed.  Andrea Pope was informed that the remainder of the evaluation will be completed by another provider, this initial triage assessment does not replace that evaluation, and the importance of remaining in the ED until their evaluation is complete.     Romeo Apple, Hong Timm A, PA-C 06/07/23 2143

## 2023-06-08 ENCOUNTER — Emergency Department
Admission: EM | Admit: 2023-06-08 | Discharge: 2023-06-08 | Disposition: A | Discharge status: Home / Self Care | Payer: Managed Care, Other (non HMO) | Attending: Emergency Medicine | Admitting: Emergency Medicine

## 2023-06-08 DIAGNOSIS — H5711 Ocular pain, right eye: Secondary | ICD-10-CM

## 2023-06-08 MED ORDER — FLUORESCEIN SODIUM 1 MG OP STRP
1.0000 | ORAL_STRIP | Freq: Once | OPHTHALMIC | Status: DC
  Filled 2023-06-08: qty 1

## 2023-06-08 MED ORDER — POLYMYXIN B-TRIMETHOPRIM 10000-0.1 UNIT/ML-% OP SOLN: OPHTHALMIC | 0 refills | Status: DC

## 2023-06-08 MED ORDER — TETRACAINE HCL 0.5 % OP SOLN
2.0000 [drp] | Freq: Once | OPHTHALMIC | Status: DC
  Filled 2023-06-08: qty 4

## 2023-06-08 NOTE — Discharge Instructions (Signed)
It is possible that you have a mild infection in your eye (conjunctivitis) and we prescribed you some antibiotic drops that you should take as prescribed.  However, we strongly encourage you to follow-up at the [facility] with Dr. Druscilla Brownie or one of his colleagues.  You can call first thing in the morning on Monday and let them know you were seen over the weekend in the emergency department and that the doctor asked you to call to be seen at the next available opportunity.  If you develop new or worsening symptoms in the meantime, please return to the emergency department.

## 2023-06-08 NOTE — ED Provider Notes (Signed)
[facility] Provider Note    Event Date/Time   First MD Initiated Contact with Patient 06/08/23 0413     (approximate)   History   Eye Pain   HPI Andrea Pope is a 53 y.o. female who presents for evaluation of 2 days of photophobia and burning pain in her right eye.  This was a gradual onset, not a sudden event.  She has had no compromise in her vision.  She does not wear corrective lenses of any type.  She has no headache.  She has had no balance difficulties.  She has had some nasal congestion recently and wonder if her sinuses could be involved.  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  There has been redness in her right eye primarily but also some in the left.    Physical Exam   Triage Vital Signs: ED Triage Vitals  Encounter Vitals Group     BP 06/07/23 2039 106/76     Systolic BP Percentile --      Diastolic BP Percentile --      Pulse Rate 06/07/23 2039 66     Resp 06/07/23 2039 15     Temp 06/07/23 2039 98.8 F (37.1 C)     Temp Source 06/07/23 2039 Oral     SpO2 06/07/23 2039 100 %     Weight --      Height --      Head Circumference --      Peak Flow --      Pain Score 06/07/23 2040 4     Pain Loc --      Pain Education --      Exclude from Growth Chart --     Most recent vital signs: Vitals:   06/07/23 2039 06/08/23 0057  BP: 106/76 111/77  Pulse: 66 71  Resp: 15 18  Temp: 98.8 F (37.1 C) 98.1 F (36.7 C)  SpO2: 100% 98%    General: Awake, no distress.  Eyes:  There is conjunctival injection of both eyes but greater right than left.  Pupils are equal and reactive.  Patient reports photophobia but is able to look at my ophthalmoscope.  Examination after tetracaine and fluorescein strip reveals no disruptions of her cornea and no foreign bodies present in or around the globe.  There is no chemosis and no periorbital edema.  OS pressure is 10 mmHg,  OD pressure 14 mmHg. CV:  Good peripheral perfusion.  Resp:  Normal effort. Speaking easily and comfortably, no accessory muscle usage nor intercostal retractions.   Abd:  No distention.    ED Results / Procedures / Treatments   Labs (all labs ordered are listed, but only abnormal results are displayed) Labs Reviewed - No data to display    PROCEDURES:  Critical Care performed: No  Procedures    IMPRESSION / MDM / ASSESSMENT AND PLAN / ED COURSE  I reviewed the triage vital signs and the nursing notes.                              Differential diagnosis includes, but is not limited to, conjunctivitis, uveitis/iritis, acute angle-closure glaucoma, corneal abrasion or ulcer, foreign body.  Patient's presentation is most consistent with acute presentation with potential threat to life or bodily function.   Interventions/Medications given:  Medications  fluorescein ophthalmic  strip 1 strip (has no administration in time range)  tetracaine (PONTOCAINE) 0.5 % ophthalmic solution 2 drop (has no administration in time range)    (Note:  hospital course my include additional interventions and/or labs/studies not listed above.)   Vital signs normal, vision normal, physical exam is reassuring.  Intraocular pressures are within normal limits and patient experienced substantial relief after tetracaine administration leading me to suspect viral or allergic conjunctivitis may be more likely that uveitis/iritis or a more intraocular issue.  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  She understands and agrees with the plan.  At this point she is medically cleared with a reassuring medical screening exam and safe to follow-up as an outpatient.         FINAL CLINICAL IMPRESSION(S) / ED DIAGNOSES   Final diagnoses:   Acute right eye pain     Rx / DC Orders   ED Discharge Orders          Ordered    trimethoprim-polymyxin b (POLYTRIM) ophthalmic solution        06/08/23 0606             Note:  This document was prepared using Dragon voice recognition software and may include unintentional dictation errors.   Loleta Rose, MD 06/08/23 [phone]

## 2023-06-09 ENCOUNTER — Encounter: Payer: Self-pay | Admitting: Family Medicine

## 2023-06-09 ENCOUNTER — Ambulatory Visit: Payer: Managed Care, Other (non HMO) | Admitting: Family Medicine

## 2023-06-09 VITALS — BP 120/72 | HR 76 | Resp 16 | Ht 68.0 in | Wt 185.0 lb

## 2023-06-09 DIAGNOSIS — H5713 Ocular pain, bilateral: Secondary | ICD-10-CM | POA: Diagnosis not present

## 2023-06-09 DIAGNOSIS — M2550 Pain in unspecified joint: Secondary | ICD-10-CM | POA: Diagnosis not present

## 2023-06-09 DIAGNOSIS — R5383 Other fatigue: Secondary | ICD-10-CM

## 2023-06-09 DIAGNOSIS — H109 Unspecified conjunctivitis: Secondary | ICD-10-CM | POA: Diagnosis not present

## 2023-06-09 DIAGNOSIS — R768 Other specified abnormal immunological findings in serum: Secondary | ICD-10-CM | POA: Diagnosis not present

## 2023-06-09 DIAGNOSIS — H04129 Dry eye syndrome of unspecified lacrimal gland: Secondary | ICD-10-CM

## 2023-06-09 MED ORDER — CROMOLYN SODIUM 4 % OP SOLN: 1.0000 [drp] | Freq: Four times a day (QID) | OPHTHALMIC | 2 refills | Status: DC | PRN

## 2023-06-09 NOTE — Progress Notes (Signed)
Patient ID: Andrea Pope, female    DOB: Jul 12, 1970, 53 y.o.   MRN: [id number]  PCP: Danelle Berry, PA-C  Chief Complaint  Patient presents with   Burning Eyes    B/L. Red, light sensitivity, painful- "sharp". On/off x2 months. Sees a retina specialist from cataract surgery.    Subjective:   Andrea Pope is a 53 y.o. female, presents to clinic with CC of the following:  HPI  Pt with intermittent bilateral eye complaints x 2 months ED visit yesterday: Eyes:  There is conjunctival injection of both eyes but greater right than left. Pupils are equal and reactive. Patient reports photophobia but is able to look at my ophthalmoscope. Examination after tetracaine and fluorescein strip reveals no disruptions of her cornea and no foreign bodies present in or around the globe. There is no chemosis and no periorbital edema. OS pressure is 10 mmHg, OD pressure 14 mmHg.  Given polytrim She has eye doctor appt tomorrow Here today also for eye pain evaluation   Eye pain, light sensitivity, redness swelling around eyes/eyelids Done a lot of abx, and systane w/o improvement  Not having typical eye allergy sx like she has in allergy seasons typically spring, she restarted cetirizine last night, no current allergy eye drops, no itching  She came here today concern for possible rheumatologic connection with eye sx and past positive ANA  She did consult with [facility] Rheumatology DR. 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   Spinal stenosis of lumbar region 11/28/2021   Prediabetes 12/04/2020   Mixed hyperlipidemia 12/04/2020   Hypertension, benign 12/03/2019   Fibrocystic breast changes,  bilateral 12/03/2019   Mild episode of recurrent major depressive disorder (HCC) 03/26/2019   Obstructive sleep apnea 12/30/2018   Vitamin B12 deficiency 01/08/2018   Vitamin D deficiency 01/08/2018   Positive ANA (antinuclear antibody) 12/15/2017   Arthralgia 12/15/2017   Dyspnea on exertion 12/15/2017   Primary osteoarthritis of right knee 05/13/2017   Overweight (BMI 25.0-29.9) 11/19/2016   White coat syndrome with hypertension 01/25/2016   Allergic rhinitis, seasonal 01/10/2015   Asymptomatic varicose veins of both lower extremities 01/10/2015      Current Outpatient Medications:    amLODipine (NORVASC) 5 MG tablet, Take 1 tablet (5 mg total) by mouth daily., Disp: 90 tablet, Rfl: 1   celecoxib (CELEBREX) 200 MG capsule, Take 200 mg by mouth 2 (two) times daily., Disp: , Rfl:    Cholecalciferol (D3 PO), Take by mouth., Disp: , Rfl:    estradiol-norethindrone (ACTIVELLA) 1-0.5 MG tablet, Take 1 tablet by mouth daily., Disp: 90 tablet, Rfl: 3   losartan (COZAAR) 100 MG tablet, Take 1 tablet by mouth daily., Disp: 90 tablet, Rfl: 1   metFORMIN (GLUCOPHAGE) 500 MG tablet, Take 1 tablet by mouth once daily with breakfast, Disp: 90 tablet, Rfl: 0   SEMAGLUTIDE,0.25 OR 0.5MG /DOS, [place], Inject into the skin., Disp: , Rfl:    fluconazole (DIFLUCAN) 150 MG tablet, Take 1 tablet (150 mg total) by mouth every 3 (three) days as needed (for vaginal itching/yeast infection sx). (Patient not taking: Reported on 06/09/2023), Disp: 2 tablet, Rfl: 0   trimethoprim-polymyxin b (POLYTRIM) ophthalmic solution,  Put 1 drop in affected eye(s) every 4 hours while awake for 1 week (Patient not taking: Reported on 06/09/2023), Disp: 10 mL, Rfl: 0   Allergies  Allergen Reactions   Naltrexone-Bupropion Hcl Er Hypertension    Palpitations, hypertension     Social History   Tobacco Use   Smoking status: Never   Smokeless tobacco: Never  Vaping Use   Vaping status: Never Used  Substance Use Topics   Alcohol  use: Yes    Alcohol/week: 2.0 standard drinks of alcohol    Types: 2 Glasses of wine per week    Comment: twice a week   Drug use: No      Chart Review Today: I personally reviewed active problem list, medication list, allergies, family history, social history, health maintenance, notes from last encounter, lab results, imaging with the patient/caregiver today.   Review of Systems  Constitutional: Negative.   HENT: Negative.    Eyes: Negative.   Respiratory: Negative.    Cardiovascular: Negative.   Gastrointestinal: Negative.   Endocrine: Negative.   Genitourinary: Negative.   Musculoskeletal: Negative.   Skin: Negative.   Allergic/Immunologic: Negative.   Neurological: Negative.   Hematological: Negative.   Psychiatric/Behavioral: Negative.    All other systems reviewed and are negative.      Objective:   Vitals:   06/09/23 1305  BP: 120/72  Pulse: 76  Resp: 16  SpO2: 99%  Weight: 185 lb (83.9 kg)  Height: 5\' 8"  (1.727 m)    Body mass index is 28.13 kg/m.  Physical Exam Vitals and nursing note reviewed.  Constitutional:      General: She is not in acute distress.    Appearance: Normal appearance. She is well-developed. She is not ill-appearing, toxic-appearing or diaphoretic.  HENT:     Head: Normocephalic and atraumatic.     Right Ear: External ear normal.     Left Ear: External ear normal.     Nose: Congestion and rhinorrhea present.     Mouth/Throat:     Mouth: Mucous membranes are moist.     Pharynx: Oropharynx is clear.  Eyes:     General: Lids are normal. No allergic shiner or scleral icterus.       Right eye: No discharge.        Left eye: No discharge.     Conjunctiva/sclera:     Right eye: Right conjunctiva is injected.     Left eye: Left conjunctiva is injected.     Pupils: Pupils are equal, round, and reactive to light.     Comments: Bilateral conjunctiva diffusely injected, no exudate, no hemorrhage Palprebal conjunctiva w/o edema or  erythema   Neck:     Trachea: No tracheal deviation.  Cardiovascular:     Rate and Rhythm: Normal rate and regular rhythm.  Pulmonary:     Effort: Pulmonary effort is normal. No respiratory distress.     Breath sounds: No stridor.  Musculoskeletal:        General: Normal range of motion.  Skin:    General: Skin is warm and dry.     Findings: No rash.  Neurological:     Mental Status: She is alert.     Motor: No abnormal muscle tone.     Coordination: Coordination normal.  Psychiatric:        Behavior: Behavior normal.      Results for orders placed or performed in visit on 04/06/23  Sodium, urine, timed   Collection Time: 04/06/23  9:00  AM  Result Value Ref Range   Sodium, 24H Ur 84 52 - 380 mmol/24 h  Potassium, urine, 24 hour   Collection Time: 04/06/23  9:00 AM  Result Value Ref Range   Potassium, 24H Ur 64 22 - 160 mmol/24 h  Chloride, 24-Hour Urine with Creatinine   Collection Time: 04/06/23  9:00 AM  Result Value Ref Range   Chloride/Creat 63 50 - 200 mmol/g creat   Chloride, 24H Ur 106 31 - 260 mmol/24 h   Creatinine, 24H Ur 1.68 0.50 - 2.15 g/24 h  Urea nitrogen (UUN), 24 hour urine   Collection Time: 04/06/23  9:00 AM  Result Value Ref Range   Urea Nitrogen, 24H Ur 8 6 - 17 g/24 h       Assessment & Plan:   1. Eye pain, bilateral (Primary) Seen in ED yesterday with unremarkable fluorisceine stain exam, normal pressure, tetracaine provided relief at time of ED exam, pt tx with polytrim drops and encouraged to f/up at [place] [facility], which is scheduled tomorrow  Vision screening here 20/20    ICD-10-CM   1. Eye pain, bilateral  H57.13 Cyclic citrul peptide antibody, IgG    Sjogrens syndrome-A extractable nuclear antibody    Sjogrens syndrome-B extractable nuclear antibody    TSH    T4, free   red eye with pain, normal vision, normal pressure and neg fluoresceine stain test in ED, pt concern for autoimmune/systemic/rheumatology etiology    2. Arthralgia,  unspecified joint  M25.50 Cyclic citrul peptide antibody, IgG    Sjogrens syndrome-A extractable nuclear antibody    Sjogrens syndrome-B extractable nuclear antibody    Sedimentation rate    C-reactive protein    ANA    Rheumatoid factor    CBC with Differential/Platelet    3. Positive ANA (antinuclear antibody)  R76.8 Cyclic citrul peptide antibody, IgG    Sjogrens syndrome-A extractable nuclear antibody    Sjogrens syndrome-B extractable nuclear antibody    Sedimentation rate    C-reactive protein    ANA    Rheumatoid factor    CBC with Differential/Platelet    4. Conjunctivitis of both eyes, unspecified conjunctivitis type  H10.9 cromolyn (OPTICROM) 4 % ophthalmic solution    Cyclic citrul peptide antibody, IgG    Sjogrens syndrome-A extractable nuclear antibody    Sjogrens syndrome-B extractable nuclear antibody   shes been treated for bacterial and currently on polytrim from ED, suspect viral vs dry eye? she can try allergy drops, cold compresses    5. Fatigue, unspecified type  R53.83 Cyclic citrul peptide antibody, IgG    Sjogrens syndrome-A extractable nuclear antibody    Sjogrens syndrome-B extractable nuclear antibody    Sedimentation rate    C-reactive protein    ANA    Rheumatoid factor    CBC with Differential/Platelet    6. Dry eye  H04.129 Sjogrens syndrome-A extractable nuclear antibody    Sjogrens syndrome-B extractable nuclear antibody     Pt will go to eye doctor tomorrow for eval - if they recommend we will do lab work   Other considerations - ensure eye drops are PF, continue allergy meds, try cold compresses  Possibly repeat autoimmune labs, sjogrens if recommended by eye specialists, pt wants thyroid also evaluated I did discuss with her other more rare causes of red painful eyes - like reactive arthritis/reiter's     Danelle Berry, PA-C 06/09/23 1:14 PM

## 2023-06-10 ENCOUNTER — Encounter: Payer: Self-pay | Admitting: Family Medicine

## 2023-06-10 ENCOUNTER — Ambulatory Visit
Admission: RE | Admit: 2023-06-10 | Discharge: 2023-06-10 | Disposition: A | Discharge status: Home / Self Care | Payer: Managed Care, Other (non HMO) | Attending: Family Medicine | Admitting: Family Medicine

## 2023-06-10 ENCOUNTER — Ambulatory Visit
Admission: RE | Admit: 2023-06-10 | Discharge: 2023-06-10 | Disposition: A | Discharge status: Home / Self Care | Payer: Managed Care, Other (non HMO) | Source: Ambulatory Visit | Attending: Family Medicine

## 2023-06-10 DIAGNOSIS — R768 Other specified abnormal immunological findings in serum: Secondary | ICD-10-CM

## 2023-06-10 DIAGNOSIS — H109 Unspecified conjunctivitis: Secondary | ICD-10-CM | POA: Insufficient documentation

## 2023-06-10 DIAGNOSIS — H5713 Ocular pain, bilateral: Secondary | ICD-10-CM | POA: Insufficient documentation

## 2023-06-10 DIAGNOSIS — R5383 Other fatigue: Secondary | ICD-10-CM

## 2023-06-10 DIAGNOSIS — M2550 Pain in unspecified joint: Secondary | ICD-10-CM | POA: Insufficient documentation

## 2023-06-10 DIAGNOSIS — R0602 Shortness of breath: Secondary | ICD-10-CM

## 2023-06-10 NOTE — Addendum Note (Signed)
Addended by: Danelle Berry on: 06/10/2023 08:57 AM   Modules accepted: Orders

## 2023-06-15 ENCOUNTER — Encounter: Payer: Self-pay | Admitting: Family Medicine

## 2023-06-23 ENCOUNTER — Other Ambulatory Visit: Payer: Self-pay | Admitting: Family Medicine

## 2023-06-23 DIAGNOSIS — M2550 Pain in unspecified joint: Secondary | ICD-10-CM

## 2023-06-23 DIAGNOSIS — R5383 Other fatigue: Secondary | ICD-10-CM

## 2023-06-23 DIAGNOSIS — H209 Unspecified iridocyclitis: Secondary | ICD-10-CM

## 2023-06-23 DIAGNOSIS — R768 Other specified abnormal immunological findings in serum: Secondary | ICD-10-CM

## 2023-07-02 ENCOUNTER — Other Ambulatory Visit: Payer: Self-pay | Admitting: Family Medicine

## 2023-07-02 NOTE — Telephone Encounter (Signed)
 Mailbox full not able to inform pt that prescription has been sent to pharmacy also pt need to schedule follow up

## 2023-07-02 NOTE — Telephone Encounter (Signed)
 Needs f/u appt

## 2023-07-22 ENCOUNTER — Ambulatory Visit (INDEPENDENT_AMBULATORY_CARE_PROVIDER_SITE_OTHER): Payer: Managed Care, Other (non HMO) | Admitting: Obstetrics and Gynecology

## 2023-07-22 ENCOUNTER — Encounter: Payer: Self-pay | Admitting: Obstetrics and Gynecology

## 2023-07-22 VITALS — BP 135/81 | HR 78 | Ht 68.0 in | Wt 182.4 lb

## 2023-07-22 DIAGNOSIS — N6320 Unspecified lump in the left breast, unspecified quadrant: Secondary | ICD-10-CM

## 2023-07-22 DIAGNOSIS — Z01419 Encounter for gynecological examination (general) (routine) without abnormal findings: Secondary | ICD-10-CM

## 2023-07-22 DIAGNOSIS — Z79890 Hormone replacement therapy (postmenopausal): Secondary | ICD-10-CM

## 2023-07-22 DIAGNOSIS — Z1231 Encounter for screening mammogram for malignant neoplasm of breast: Secondary | ICD-10-CM

## 2023-07-22 DIAGNOSIS — N63 Unspecified lump in unspecified breast: Secondary | ICD-10-CM

## 2023-07-22 DIAGNOSIS — Z780 Asymptomatic menopausal state: Secondary | ICD-10-CM

## 2023-07-22 MED ORDER — ESTRADIOL-NORETHINDRONE ACET 1-0.5 MG PO TABS: 1.0000 | ORAL_TABLET | Freq: Every day | ORAL | 3 refills | Status: DC

## 2023-07-22 NOTE — Progress Notes (Signed)
 Patients presents for annual exam today. She states doing well with current HRT medication, refill sent in. Due for mammogram and dexa scan, ordered. Up to date on pap smear, colonoscopy and lab work. She states no other questions or concerns at this time.

## 2023-07-22 NOTE — Progress Notes (Signed)
 HPI:      Andrea Pope is a 53 y.o. [id number] who LMP was No LMP recorded. Patient has had an ablation.  Subjective:   She presents today for her annual examination.  She has no complaints.  She continues to take Activella and is doing well on it.  She would like to continue.  She is up-to-date on her Pap smear.  Her mammogram is scheduled for June.    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 She does not have any pertinent problems on file. She  has a past surgical history that includes Cholecystectomy (N/A, 01/19/2016); laser surgery on eye (Right, 10/2016); Dilatation & currettage/hysteroscopy with novasure ablation (N/A, 03/24/2017); and Cataract extraction w/ intraocular lens implant (Left, 03/26/2022). Her family history includes Cancer in her father, paternal grandfather, and paternal uncle; Diabetes in her brother, maternal grandmother, sister, and another family member; Hypertension in her father and mother; Kidney disease in her mother; Schizophrenia in her sister; Thyroid disease in her mother. She  reports that she has never smoked. She has never used smokeless tobacco. She reports current alcohol use of about 2.0 standard drinks of alcohol per week. She reports that she does not use  drugs. She has a current medication list which includes the following prescription(s): amlodipine, celecoxib, cholecalciferol, cromolyn, losartan, metformin, semaglutide, trimethoprim-polymyxin b, and estradiol-norethindrone. She is allergic to naltrexone-bupropion hcl er.       Review of Systems:  Review of Systems  Constitutional: Denied constitutional symptoms, night sweats, recent illness, fatigue, fever, insomnia and weight loss.  Eyes: Denied eye symptoms, eye pain, photophobia, vision change and visual disturbance.  Ears/Nose/Throat/Neck: Denied ear, nose, throat or neck symptoms, hearing loss, nasal discharge, sinus congestion and sore throat.  Cardiovascular: Denied cardiovascular symptoms, arrhythmia, chest pain/pressure, edema, exercise intolerance, orthopnea and palpitations.  Respiratory: Denied pulmonary symptoms, asthma, pleuritic pain, productive sputum, cough, dyspnea and wheezing.  Gastrointestinal: Denied, gastro-esophageal reflux, melena, nausea and vomiting.  Genitourinary: Denied genitourinary symptoms including symptomatic vaginal discharge, pelvic relaxation issues, and urinary complaints.  Musculoskeletal: Denied musculoskeletal symptoms, stiffness, swelling, muscle weakness and myalgia.  Dermatologic: Denied dermatology symptoms, rash and scar.  Neurologic: Denied neurology symptoms, dizziness, headache, neck pain and syncope.  Psychiatric: Denied psychiatric symptoms, anxiety and depression.  Endocrine: Denied endocrine symptoms including hot flashes and night sweats.   Meds:   Current Outpatient Medications on File Prior to Visit  Medication Sig Dispense Refill   amLODipine (NORVASC) 5 MG tablet Take 1 tablet by mouth once daily 90 tablet 0   celecoxib (CELEBREX) 200 MG capsule Take 200 mg by mouth 2 (two) times daily.     Cholecalciferol (D3 PO) Take by mouth.     cromolyn (OPTICROM) 4 % ophthalmic solution Place 1 drop into both eyes 4 (four) times daily as  needed. 10 mL 2   losartan (COZAAR) 100 MG tablet Take 1 tablet by mouth daily. 90 tablet 1   metFORMIN (GLUCOPHAGE) 500 MG  tablet Take 1 tablet by mouth once daily with breakfast 90 tablet 0   SEMAGLUTIDE,0.25 OR 0.5MG /DOS, [place] Inject into the skin.     trimethoprim-polymyxin b (POLYTRIM) ophthalmic solution Put 1 drop in affected eye(s) every 4 hours while awake for 1 week 10 mL 0   No current facility-administered medications on file prior to visit.     Objective:     Vitals:   07/22/23 1504 07/22/23 1516  BP: (!) 161/74 135/81  Pulse: 78     Filed Weights   07/22/23 1504  Weight: 182 lb 6.4 oz (82.7 kg)              Physical examination General NAD, Conversant  HEENT Atraumatic; Op clear with mmm.  Normo-cephalic.  Anicteric sclerae  Thyroid/Neck Smooth without nodularity or enlargement. Normal ROM.  Neck Supple.  Skin No rashes, lesions or ulceration. Normal palpated skin turgor. No nodularity.  Breasts: Small left sided breast mass approximately 2:00.  1 to 2 cm.  It is mobile mildly tender fluctuant.  There is no breast discharge.  Lungs: Clear to auscultation.No rales or wheezes. Normal Respiratory effort, no retractions.  Heart: NSR.  No murmurs or rubs appreciated. No peripheral edema  Abdomen: Soft.  Non-tender.  No masses.  No HSM. No hernia  Extremities: Moves all appropriately.  Normal ROM for age. No lymphadenopathy.  Neuro: Oriented to PPT.  Normal mood. Normal affect.     Pelvic: Deferred as she is asymptomatic.    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   Spinal stenosis of lumbar region 11/28/2021   Prediabetes 12/04/2020   Mixed hyperlipidemia 12/04/2020   Hypertension, benign 12/03/2019   Fibrocystic breast changes, bilateral 12/03/2019   Mild episode of recurrent major depressive disorder (HCC) 03/26/2019   Obstructive sleep apnea 12/30/2018   Vitamin B12 deficiency  01/08/2018   Vitamin D deficiency 01/08/2018   Positive ANA (antinuclear antibody) 12/15/2017   Arthralgia 12/15/2017   Dyspnea on exertion 12/15/2017   Primary osteoarthritis of right knee 05/13/2017   Overweight (BMI 25.0-29.9) 11/19/2016   White coat syndrome with hypertension 01/25/2016   Allergic rhinitis, seasonal 01/10/2015   Asymptomatic varicose veins of both lower extremities 01/10/2015     1. Well woman exam with routine gynecological exam   2. Screening mammogram for breast cancer   3. Postmenopausal hormone therapy   4. Postmenopausal   5. Breast mass in female     Doing well on HRT  Small left breast mass.  Patient has a history of breast cysts.  Based on clinical appearance likely breast cyst-very unlikely breast cancer.   Plan:            1.  Basic Screening Recommendations The basic screening recommendations for asymptomatic women were discussed with the patient during her visit.  The age-appropriate recommendations were discussed with her and the rational for the tests reviewed.  When I am informed by the patient that another primary care physician has previously obtained the age-appropriate tests and they are up-to-date, only outstanding tests are ordered and referrals given as necessary.  Abnormal results of tests will be discussed with her when all of her results are completed.  Routine preventative health maintenance measures emphasized: Exercise/Diet/Weight control, Tobacco Warnings, Alcohol/Substance use risks and Stress Management 2.  Continue HRT 3.  Mammogram in June -I think this will be appropriate follow-up for what is likely a breast cyst. 4.  DEXA scan (screening) -with her mammogram in June. Orders Orders Placed This Encounter  Procedures   MM DIGITAL SCREENING BILATERAL   DG Bone Density     Meds ordered this encounter  Medications   estradiol-norethindrone (ACTIVELLA) 1-0.5 MG tablet    Sig: Take 1 tablet by mouth daily.    Dispense:  90  tablet    Refill:  3           F/U  Return in about 1 year (around 07/21/2024) for Annual Physical.  Elonda Husky, M.D. 07/22/2023 3:31 PM

## 2023-07-26 ENCOUNTER — Other Ambulatory Visit: Payer: Self-pay | Admitting: Family Medicine

## 2023-08-10 ENCOUNTER — Encounter: Payer: Self-pay | Admitting: Obstetrics and Gynecology

## 2023-08-10 DIAGNOSIS — N6320 Unspecified lump in the left breast, unspecified quadrant: Secondary | ICD-10-CM

## 2023-08-11 ENCOUNTER — Other Ambulatory Visit: Payer: Self-pay | Admitting: Obstetrics and Gynecology

## 2023-08-11 DIAGNOSIS — N6320 Unspecified lump in the left breast, unspecified quadrant: Secondary | ICD-10-CM

## 2023-08-11 NOTE — Addendum Note (Signed)
 Addended by: Georgiana Shore R on: 08/11/2023 11:54 AM   Modules accepted: Orders

## 2023-08-21 ENCOUNTER — Ambulatory Visit
Admission: RE | Admit: 2023-08-21 | Discharge: 2023-08-21 | Disposition: A | Discharge status: Home / Self Care | Source: Ambulatory Visit | Attending: Obstetrics and Gynecology | Admitting: Obstetrics and Gynecology

## 2023-08-21 ENCOUNTER — Other Ambulatory Visit: Payer: Self-pay | Admitting: Obstetrics and Gynecology

## 2023-08-21 DIAGNOSIS — N6320 Unspecified lump in the left breast, unspecified quadrant: Secondary | ICD-10-CM | POA: Diagnosis present

## 2023-08-21 DIAGNOSIS — N6310 Unspecified lump in the right breast, unspecified quadrant: Secondary | ICD-10-CM

## 2023-08-21 NOTE — Telephone Encounter (Signed)
 [facility] called for verbal on additional imaging. Patient is there for f/u on left breast, but there is a new area on the right breast. They will send orders for Dr. Luster Salters to sign.

## 2023-08-22 ENCOUNTER — Ambulatory Visit: Admitting: Internal Medicine

## 2023-08-22 ENCOUNTER — Encounter: Payer: Self-pay | Admitting: Internal Medicine

## 2023-08-22 VITALS — BP 122/80 | HR 69 | Temp 98.7°F | Resp 18 | Wt 180.2 lb

## 2023-08-22 DIAGNOSIS — J302 Other seasonal allergic rhinitis: Secondary | ICD-10-CM

## 2023-08-22 DIAGNOSIS — H6993 Unspecified Eustachian tube disorder, bilateral: Secondary | ICD-10-CM | POA: Diagnosis not present

## 2023-08-22 DIAGNOSIS — R0981 Nasal congestion: Secondary | ICD-10-CM | POA: Diagnosis not present

## 2023-08-22 NOTE — Progress Notes (Signed)
 Acute Office Visit  Subjective:     Patient ID: Andrea Pope, female    DOB: [birth year], 53 y.o.   MRN: [id number]  Chief Complaint  Patient presents with   Nasal Congestion    Green mucus, x2 weeks taking otc allergy meds no other symptoms    HPI Patient is in today for nasal congestion x 2 weeks.  Discussed the use of AI scribe software for clinical note transcription with the patient, who gave verbal consent to proceed.  History of Present Illness The patient presents with a two-week history of nasal congestion, initially attributed to allergies. She describes the mucus as 'thick' and green in color. She also reports a history of coughing, but no current cough. She experienced ear pain a few days ago, which she thought was due to excessive nose blowing. She denies any sinus pain, fever, or sore throat. She has been taking over-the-counter cetirizine (Zyrtec) without significant improvement in symptoms. She reports some sneezing and watery, itchy eyes, but she also has a concurrent eye issue (uveitis), which may contribute to these symptoms. 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 Her son is experiencing similar symptoms.    Review of Systems  Constitutional:  Negative for chills and fever.  HENT:  Positive for congestion. Negative for ear discharge, ear pain, sinus pain and sore throat.   Respiratory:  Positive for cough. Negative for shortness of breath and wheezing.         Objective:    BP 122/80   Pulse 69   Temp 98.7 F (37.1 C)   Resp 18   Wt 180 lb 3.2 oz (81.7 kg)   SpO2 97%   BMI 27.40 kg/m  BP Readings from Last 3 Encounters:  08/22/23 122/80  07/22/23 135/81  06/09/23 120/72   Wt Readings from Last 3 Encounters:  08/22/23 180 lb 3.2 oz (81.7 kg)  07/22/23 182 lb 6.4 oz (82.7 kg)  06/09/23 185 lb (83.9 kg)      Physical Exam Constitutional:      Appearance: Normal appearance.  HENT:     Head:  Normocephalic and atraumatic.     Right Ear: Ear canal and external ear normal.     Left Ear: Ear canal and external ear normal.     Ears:     Comments: Bilateral eustachian tube dysfunction with small amount of fluid present, no infection    Nose: Nose normal.     Mouth/Throat:     Mouth: Mucous membranes are moist.     Pharynx: Oropharynx is clear.  Eyes:     Conjunctiva/sclera: Conjunctivae normal.  Cardiovascular:     Rate and Rhythm: Normal rate and regular rhythm.  Pulmonary:     Effort: Pulmonary effort is normal.     Breath sounds: Normal breath sounds.  Skin:    General: Skin is warm and dry.  Neurological:     General: No focal deficit present.     Mental Status: She is alert. Mental status is at baseline.  Psychiatric:        Mood and Affect: Mood normal.        Behavior: Behavior normal.     No results found for any visits on 08/22/23.      Assessment & Plan:   Assessment & Plan Eustachian tube dysfunction Sinus inflammation likely due to allergies or viral illness causing eustachian tube dysfunction. Fluid in ears, no significant sinus drainage. Conservative management chosen. -  Continue cetirizine (Zyrtec). - Use Flonase  nasal spray, two sprays each side twice daily. - Consider Corticin for congestion  - Use Mucinex  to thin mucus. - Avoid antibiotics unless symptoms worsen or persist.   Return if symptoms worsen or fail to improve.  Rockney Cid, DO

## 2023-08-22 NOTE — Patient Instructions (Signed)
 It was great seeing you today!  Plan discussed at today's visit: -Recommend over the counter Zyrtec for allergies, a decongestant like Coricidin and nasal steroids (Flonase ) - green cap, can use 2 sprays on each side twice a day. Can also try anti-histamine nasal spray (Astelin) -Also recommend Mucinex  to help thin out mucus -If symptoms fail to resolve or worsen, let me know and I can prescribe an antibiotic  Follow up in: as needed  Take care and let us  know if you have any questions or concerns prior to your next visit.  Dr. Bud Care

## 2023-08-29 ENCOUNTER — Other Ambulatory Visit

## 2023-08-29 ENCOUNTER — Encounter

## 2023-09-16 ENCOUNTER — Other Ambulatory Visit: Payer: Self-pay | Admitting: Nurse Practitioner

## 2023-09-16 DIAGNOSIS — R3589 Other polyuria: Secondary | ICD-10-CM

## 2023-09-16 DIAGNOSIS — R631 Polydipsia: Secondary | ICD-10-CM

## 2023-09-16 DIAGNOSIS — R7303 Prediabetes: Secondary | ICD-10-CM

## 2023-09-18 NOTE — Telephone Encounter (Signed)
 Requested Prescriptions  Pending Prescriptions Disp Refills   metFORMIN  (GLUCOPHAGE ) 500 MG tablet [Pharmacy Med Name: metFORMIN  HCl 500 MG Oral Tablet] 90 tablet 0    Sig: Take 1 tablet by mouth once daily with breakfast     Endocrinology:  Diabetes - Biguanides Passed - 09/18/2023 12:51 PM      Passed - Cr in normal range and within 360 days    Creat  Date Value Ref Range Status  04/02/2023 0.90 0.50 - 1.03 mg/dL Final   Creatinine, Urine  Date Value Ref Range Status  04/02/2023 180 20 - 275 mg/dL Final         Passed - HBA1C is between 0 and 7.9 and within 180 days    Hemoglobin A1C  Date Value Ref Range Status  03/26/2023 5.7 (A) 4.0 - 5.6 % Final   Hgb A1c MFr Bld  Date Value Ref Range Status  12/16/2022 5.6 <5.7 % of total Hgb Final    Comment:    For the purpose of screening for the presence of diabetes: . <5.7%       Consistent with the absence of diabetes 5.7-6.4%    Consistent with increased risk for diabetes             (prediabetes) > or =6.5%  Consistent with diabetes . This assay result is consistent with a decreased risk of diabetes. . Currently, no consensus exists regarding use of hemoglobin A1c for diagnosis of diabetes in children. . According to American Diabetes Association (ADA) guidelines, hemoglobin A1c <7.0% represents optimal control in non-pregnant diabetic patients. Different metrics may apply to specific patient populations.  Standards of Medical Care in Diabetes(ADA). .          Passed - eGFR in normal range and within 360 days    GFR, Est African American  Date Value Ref Range Status  12/23/2019 91 > OR = 60 mL/min/1.26m2 Final   GFR, Est Non African American  Date Value Ref Range Status  12/23/2019 78 > OR = 60 mL/min/1.64m2 Final   GFR, Estimated  Date Value Ref Range Status  09/04/2022 >60 >60 mL/min Final    Comment:    (NOTE) Calculated using the CKD-EPI Creatinine Equation (2021)    eGFR  Date Value Ref Range  Status  04/02/2023 78 > OR = 60 mL/min/1.75m2 Final         Passed - B12 Level in normal range and within 720 days    Vitamin B-12  Date Value Ref Range Status  07/17/2022 361 200 - 1,100 pg/mL Final    Comment:    . Please Note: Although the reference range for vitamin B12 is [phone] pg/mL, it has been reported that between 5 and 10% of patients with values between 200 and 400 pg/mL may experience neuropsychiatric and hematologic abnormalities due to occult B12 deficiency; less than 1% of patients with values above 400 pg/mL will have symptoms. Temple Feeler - Valid encounter within last 6 months    Recent Outpatient Visits           3 weeks ago Seasonal allergic rhinitis, unspecified trigger   [facility] Rockney Cid, DO   3 months ago Eye pain, bilateral   [facility] [place], [place], PA-C              Passed - CBC within normal limits and completed in the last 12 months  WBC  Date Value Ref Range Status  04/02/2023 4.3 3.8 - 10.8 Thousand/uL Final   RBC  Date Value Ref Range Status  04/02/2023 4.25 3.80 - 5.10 Million/uL Final   Hemoglobin  Date Value Ref Range Status  04/02/2023 13.0 11.7 - 15.5 g/dL Final  16/02/9603 54.0 11.1 - 15.9 g/dL Final   HCT  Date Value Ref Range Status  04/02/2023 39.1 35.0 - 45.0 % Final   Hematocrit  Date Value Ref Range Status  01/28/2017 37.8 34.0 - 46.6 % Final   MCHC  Date Value Ref Range Status  04/02/2023 33.2 32.0 - 36.0 g/dL Final    Comment:    For adults, a slight decrease in the calculated MCHC value (in the range of 30 to 32 g/dL) is most likely not clinically significant; however, it should be interpreted with caution in correlation with other red cell parameters and the patient's clinical condition.    [facility]  Date Value Ref Range Status  04/02/2023 30.6 27.0 - 33.0 pg Final   MCV  Date Value Ref Range Status  04/02/2023 92.0 80.0 -  100.0 fL Final  01/28/2017 91 79 - 97 fL Final   No results found for: "PLTCOUNTKUC", "LABPLAT", "POCPLA" RDW  Date Value Ref Range Status  04/02/2023 12.3 11.0 - 15.0 % Final  01/28/2017 13.7 12.3 - 15.4 % Final

## 2023-10-01 ENCOUNTER — Other Ambulatory Visit: Payer: Self-pay | Admitting: Family Medicine

## 2023-10-03 NOTE — Telephone Encounter (Signed)
 Requested Prescriptions  Pending Prescriptions Disp Refills   amLODipine  (NORVASC ) 5 MG tablet [Pharmacy Med Name: amLODIPine  Besylate 5 MG Oral Tablet] 90 tablet 0    Sig: Take 1 tablet by mouth once daily     Cardiovascular: Calcium Channel Blockers 2 Passed - 10/03/2023 11:22 AM      Passed - Last BP in normal range    BP Readings from Last 1 Encounters:  08/22/23 122/80         Passed - Last Heart Rate in normal range    Pulse Readings from Last 1 Encounters:  08/22/23 69         Passed - Valid encounter within last 6 months    Recent Outpatient Visits           1 month ago Seasonal allergic rhinitis, unspecified trigger   [facility] Rockney Cid, DO   3 months ago Eye pain, bilateral   [facility] Adeline Hone, PA-C

## 2023-10-14 ENCOUNTER — Encounter: Payer: Self-pay | Admitting: Cardiology

## 2023-10-14 ENCOUNTER — Ambulatory Visit: Attending: Cardiology | Admitting: Cardiology

## 2023-10-14 VITALS — BP 110/72 | HR 53 | Ht 68.0 in | Wt 183.0 lb

## 2023-10-14 DIAGNOSIS — I83893 Varicose veins of bilateral lower extremities with other complications: Secondary | ICD-10-CM

## 2023-10-14 DIAGNOSIS — R7303 Prediabetes: Secondary | ICD-10-CM | POA: Diagnosis not present

## 2023-10-14 DIAGNOSIS — I10 Essential (primary) hypertension: Secondary | ICD-10-CM

## 2023-10-14 DIAGNOSIS — H30033 Focal chorioretinal inflammation, peripheral, bilateral: Secondary | ICD-10-CM | POA: Diagnosis not present

## 2023-10-14 DIAGNOSIS — R001 Bradycardia, unspecified: Secondary | ICD-10-CM

## 2023-10-14 NOTE — Patient Instructions (Signed)
 Medication Instructions:   Your physician recommends that you continue on your current medications as directed. Please refer to the Current Medication list given to you today.   *If you need a refill on your cardiac medications before your next appointment, please call your pharmacy*  Lab Work:  No labs ordered today   If you have labs (blood work) drawn today and your tests are completely normal, you will receive your results only by: MyChart Message (if you have MyChart) OR A paper copy in the mail If you have any lab test that is abnormal or we need to change your treatment, we will call you to review the results.  Testing/Procedures:  No test ordered today   Follow-Up: At [facility] you and your health needs are our priority.  As part of our continuing mission to provide you with exceptional heart care, our providers are all part of one team.  This team includes your primary Cardiologist (physician) and Advanced Practice Providers or APPs (Physician Assistants and Nurse Practitioners) who all work together to provide you with the care you need, when you need it.  Your next appointment:   1 year(s)  Provider:   You may see Constancia Delton, MD or one of the following Advanced Practice Providers on your designated Care Team:    Ronald Cockayne, NP

## 2023-10-14 NOTE — Progress Notes (Signed)
 Cardiology Office Note   Date:  10/14/2023  ID:  Andrea Pope, DOB May 27, 1970, MRN [id number] PCP: Adeline Hone, PA-C  [facility] Providers Cardiologist:  Constancia Delton, MD     History of Present Illness Andrea Pope is a 53 y.o. female with a past medical history of hypertension, anemia, and anxiety, who is following up today on her hypertension.   Prior echocardiogram completed 12/2019 which showed normal systolic and diastolic function mildly LVEF of 60 to 65%, no RWMA, no valvular abnormalities were noted.  Coronary CTA was completed 01/2019 which revealed a calcium score of 0 and showed no evidence of coronary artery disease.  She was seen in clinic 03/23/2021 by Dr. Duanne Gess 18 for elevated blood pressures.  She was started on losartan  with eventually the addition of amlodipine .  She continued to have occasional headaches and being extremely stressed at work causing elevated blood pressures.  She been evaluated in the [facility] emergency department on 03/09/2022 for shoulder pain and jaw pain.  Presented with pain to her right arm, jaw, and neck, was concerned that it was heart pain.  Blood pressure was 137/80, pulse of 60, respirations of 16, temperature 98.9.  Pertinent labs revealed potassium 3.4, blood glucose 111, calcium 8.6, high-sensitivity troponin 2.  Emergency room felt that her presentation was most consistent with musculoskeletal pain given it was reproducible she was treated with a trial of NSAIDs.   She was last seen in clinic 03/27/2022 stating overall she was doing well from a cardiac standpoint.  She continued to worsen chronic back pain and recently received her injection was told she has some degenerative disc disease.  Blood pressure was better controlled.  She was continued on amlodipine  5 mg daily and losartan  100 mg daily.  LDL was 152 and she was not interested in starting a statin therapy at that time.  She returns to clinic today stating for the  cardiac perspective she has been doing well.  [facility] inflammation of both eyes.  She denies any chest pain, shortness of breath, dyspnea on exertion.  She does occasionally have peripheral edema with dietary indiscretions of high sodium or with riding in the car for an extended period of time that she does not have her daughter back and forth to school which is a 2-hour drive.  States that she has been compliant with her current medication regimen with any adverse side effects.  Denies any hospitalizations or visits to the emergency department.  ROS: 10 point review of systems has been reviewed and considered negative with exception what is listed in the HPI  Studies Reviewed EKG Interpretation Date/Time:  Tuesday October 14 2023 10:05:02 EDT Ventricular Rate:  53 PR Interval:  138 QRS Duration:  70 QT Interval:  430 QTC Calculation: 403 R Axis:   15  Text Interpretation: Sinus bradycardia When compared with ECG of 04-Sep-2022 13:11, No significant change was found Confirmed by Ronald Cockayne ([id number]) on 10/14/2023 10:06:49 AM   cCTA 01/06/2020 IMPRESSION: 1. Coronary calcium score of 0. Patient is low risk for near term coronary events   2. Normal coronary origin with right dominance.   3. No evidence of CAD.   4. CAD-RADS 0. No evidence of CAD (0%). Consider non-atherosclerotic causes of chest pain.  2D echo 01/03/2020  1. Left ventricular ejection fraction, by estimation, is 60 to 65%. The  left ventricle has normal function. The left ventricle has no regional  wall motion abnormalities. Left ventricular diastolic parameters were  normal.   2. Right ventricular systolic function is normal. The right ventricular  size is normal. There is normal pulmonary artery systolic pressure.   3. The mitral valve is normal in structure. No evidence of mitral valve  regurgitation. No evidence of mitral stenosis.   4. The aortic valve  is normal in structure. Aortic valve regurgitation is  not visualized. No aortic stenosis is present.   5. The inferior vena cava is normal in size with greater than 50%  respiratory variability, suggesting right atrial pressure of 3 mmHg.    Event Monitor (Zio) 11/01/2019 Patient had a min HR of 44 bpm, max HR of 140 bpm, and avg HR of 73 bpm. Predominant underlying rhythm was Sinus Rhythm. Isolated SVEs were rare (<1.0%), patient triggered events were associated with sinus rhythm. Overall normal cardiac monitor with no significant arrhythmias. Risk Assessment/Calculations           Physical Exam VS:  BP 110/72   Pulse (!) 53   Ht 5\' 8"  (1.727 m)   Wt 183 lb (83 kg)   SpO2 99%   BMI 27.83 kg/m    Wt Readings from Last 3 Encounters:  10/14/23 183 lb (83 kg)  08/22/23 180 lb 3.2 oz (81.7 kg)  07/22/23 182 lb 6.4 oz (82.7 kg)    GEN: Well nourished, well developed in no acute distress NECK: No JVD; No carotid bruits CARDIAC: RRR, no murmurs, rubs, gallops RESPIRATORY:  Clear to auscultation without rales, wheezing or rhonchi  ABDOMEN: Soft, non-tender, non-distended EXTREMITIES:  No edema; No deformity   ASSESSMENT AND PLAN Primary hypertension with blood pressure today is very well-controlled at 110/72.  She has been continued on amlodipine  5 mg daily and losartan  100 mg daily.  She has been encouraged to continue to monitor pressures 1 to 2 hours postmedication administration as well.  Sinus bradycardia noted on EKG today with a rate of 53.  No significant changes noted on EKG today from prior studies.  She is currently not on any AV nodal blocking agents.  Will continue to monitor with surveillance echocardiograms.  She had previously worn a ZIO monitor which showed an average heart rate of 73 with a lowest heart rate of 45 bpm overall no significant arrhythmias.  She has chronotropically competent as her heart rate does elevate with activity.  Will continue to monitor for  symptoms and if need be repeat ZIO monitoring at a later date.  Peripheral edema to the bilateral lower extremity on occasion that is prominent with dietary indiscretions of elevated salt.  Known history of varicose veins.  CEAP class III.  She has been encouraged to continue to participate in conservative therapy of elevating her extremities, foot calf pumps, decrease her sodium intake, and compression stockings.  If continued issues we will consider referral to vein and vascular for venous ablation.  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  She is on an immunosuppressive drug that was recently started to help with the inflammation process.  Weight management per her ophthalmologist.  Prediabetes with a hemoglobin A1c 5.7.  She is continued on metformin  with ongoing management per PCP.       Dispo: Patient to return to clinic to see MD/APP in 11 to 12 months or sooner if needed for further evaluation  Signed, Mahdiya Mossberg, NP

## 2023-11-01 ENCOUNTER — Other Ambulatory Visit: Payer: Self-pay | Admitting: Family Medicine

## 2023-11-03 ENCOUNTER — Encounter

## 2023-11-03 ENCOUNTER — Ambulatory Visit
Admission: RE | Admit: 2023-11-03 | Discharge: 2023-11-03 | Disposition: A | Discharge status: Home / Self Care | Source: Ambulatory Visit | Attending: Obstetrics and Gynecology | Admitting: Obstetrics and Gynecology

## 2023-11-03 DIAGNOSIS — Z01419 Encounter for gynecological examination (general) (routine) without abnormal findings: Secondary | ICD-10-CM | POA: Diagnosis present

## 2023-11-03 DIAGNOSIS — Z780 Asymptomatic menopausal state: Secondary | ICD-10-CM | POA: Diagnosis present

## 2023-11-03 NOTE — Telephone Encounter (Signed)
 Requested medication (s) are due for refill today: yes  Requested medication (s) are on the active medication list: yes  Last refill:  07/28/23 #90  Future visit scheduled: no  Notes to clinic:  overdue labs    Requested Prescriptions  Pending Prescriptions Disp Refills   losartan  (COZAAR ) 100 MG tablet [Pharmacy Med Name: Losartan  Potassium 100 MG Oral Tablet] 90 tablet 0    Sig: Take 1 tablet by mouth once daily     Cardiovascular:  Angiotensin Receptor Blockers Failed - 11/03/2023  3:23 PM      Failed - Cr in normal range and within 180 days    Creat  Date Value Ref Range Status  04/02/2023 0.90 0.50 - 1.03 mg/dL Final   Creatinine, Urine  Date Value Ref Range Status  04/02/2023 180 20 - 275 mg/dL Final         Failed - K in normal range and within 180 days    Potassium  Date Value Ref Range Status  04/02/2023 4.2 3.5 - 5.3 mmol/L Final         Passed - Patient is not pregnant      Passed - Last BP in normal range    BP Readings from Last 1 Encounters:  10/14/23 110/72         Passed - Valid encounter within last 6 months    Recent Outpatient Visits           2 months ago Seasonal allergic rhinitis, unspecified trigger   [facility] Bernardo Fend, DO   4 months ago Eye pain, bilateral   [facility] Leavy Mole, PA-C

## 2023-11-11 ENCOUNTER — Encounter: Payer: Self-pay | Admitting: Obstetrics and Gynecology

## 2024-01-02 ENCOUNTER — Other Ambulatory Visit: Payer: Self-pay | Admitting: Family Medicine

## 2024-01-02 ENCOUNTER — Encounter: Admitting: Family Medicine

## 2024-01-02 DIAGNOSIS — R7303 Prediabetes: Secondary | ICD-10-CM

## 2024-01-02 DIAGNOSIS — Z0000 Encounter for general adult medical examination without abnormal findings: Secondary | ICD-10-CM

## 2024-01-04 NOTE — Telephone Encounter (Signed)
 Requested Prescriptions  Pending Prescriptions Disp Refills   amLODipine  (NORVASC ) 5 MG tablet [Pharmacy Med Name: amLODIPine  Besylate 5 MG Oral Tablet] 90 tablet 0    Sig: Take 1 tablet by mouth once daily     Cardiovascular: Calcium Channel Blockers 2 Failed - 01/04/2024 10:44 AM      Failed - Valid encounter within last 6 months    Recent Outpatient Visits           4 months ago Seasonal allergic rhinitis, unspecified trigger   [facility] Bernardo Fend, DO   6 months ago Eye pain, bilateral   [facility] Leavy Mole, PA-C              Passed - Last BP in normal range    BP Readings from Last 1 Encounters:  10/14/23 110/72         Passed - Last Heart Rate in normal range    Pulse Readings from Last 1 Encounters:  10/14/23 (!) 53

## 2024-01-14 ENCOUNTER — Encounter: Payer: Self-pay | Admitting: Family Medicine

## 2024-01-14 ENCOUNTER — Ambulatory Visit (INDEPENDENT_AMBULATORY_CARE_PROVIDER_SITE_OTHER): Admitting: Family Medicine

## 2024-01-14 VITALS — BP 124/72 | HR 67 | Resp 16 | Ht 68.0 in | Wt 186.0 lb

## 2024-01-14 DIAGNOSIS — Z0001 Encounter for general adult medical examination with abnormal findings: Secondary | ICD-10-CM

## 2024-01-14 DIAGNOSIS — Z7185 Encounter for immunization safety counseling: Secondary | ICD-10-CM

## 2024-01-14 DIAGNOSIS — N952 Postmenopausal atrophic vaginitis: Secondary | ICD-10-CM | POA: Diagnosis not present

## 2024-01-14 DIAGNOSIS — Z0000 Encounter for general adult medical examination without abnormal findings: Secondary | ICD-10-CM

## 2024-01-14 DIAGNOSIS — Z79890 Hormone replacement therapy (postmenopausal): Secondary | ICD-10-CM

## 2024-01-14 DIAGNOSIS — N3946 Mixed incontinence: Secondary | ICD-10-CM

## 2024-01-14 MED ORDER — ESTRADIOL-NORETHINDRONE ACET 1-0.5 MG PO TABS: 1.0000 | ORAL_TABLET | Freq: Every day | ORAL | 3 refills | Status: AC

## 2024-01-14 MED ORDER — ESTRADIOL 0.1 MG/GM VA CREA: 1.0000 | TOPICAL_CREAM | VAGINAL | 12 refills | Status: DC

## 2024-01-14 MED ORDER — AMLODIPINE BESYLATE 5 MG PO TABS: 5.0000 mg | ORAL_TABLET | Freq: Every day | ORAL | 3 refills | Status: AC

## 2024-01-14 MED ORDER — LOSARTAN POTASSIUM 100 MG PO TABS: 100.0000 mg | ORAL_TABLET | Freq: Every day | ORAL | 3 refills | Status: AC

## 2024-01-14 NOTE — Progress Notes (Signed)
 Patient: Andrea Pope, Female    DOB: [birth year], 53 y.o.   MRN: [id number] Leavy Mole, PA-C Visit Date: 01/14/2024  Today's Provider: Mole Leavy, PA-C   Chief Complaint  Patient presents with   Annual Exam   Subjective:   Annual physical exam:  Andrea Pope is a 53 y.o. female who presents today for complete physical exam:  Exercise/Activity:  active, healthy Diet/nutrition:  healthy Sleep:  no concerns   SDOH Screenings   Food Insecurity: No Food Insecurity (01/14/2024)  Housing: Low Risk  (01/14/2024)  Transportation Needs: No Transportation Needs (01/14/2024)  Utilities: Not At Risk (01/14/2024)  Alcohol Screen: Low Risk  (01/14/2024)  Depression (PHQ2-9): Low Risk  (01/14/2024)  Financial Resource Strain: Low Risk  (01/14/2024)  Physical Activity: Sufficiently Active (01/14/2024)  Social Connections: Moderately Integrated (01/14/2024)  Stress: No Stress Concern Present (01/14/2024)  Tobacco Use: Low Risk  (01/14/2024)  Health Literacy: Adequate Health Literacy (01/14/2024)      USPSTF grade A and B recommendations - reviewed and addressed today  Depression:  Phq 9 completed today by patient, was reviewed by me with patient in the room PHQ score is neg, pt feels overall ok    01/14/2024    3:26 PM 08/22/2023    9:53 AM 04/02/2023   11:47 AM 03/26/2023   11:42 AM  PHQ 2/9 Scores  PHQ - 2 Score 0 0 0 0  PHQ- 9 Score  0        01/14/2024    3:26 PM 08/22/2023    9:53 AM 04/02/2023   11:47 AM 03/26/2023   11:42 AM 12/16/2022    1:40 PM  Depression screen PHQ 2/9  Decreased Interest 0 0  0 0  Down, Depressed, Hopeless 0 0 0 0 0  PHQ - 2 Score 0 0 0 0 0  Altered sleeping  0   0  Tired, decreased energy  0   0  Change in appetite  0   0  Feeling bad or failure about yourself   0   0  Trouble concentrating  0   0  Moving slowly or fidgety/restless  0   0  Suicidal thoughts  0   0  PHQ-9 Score  0   0  Difficult doing work/chores  Not difficult at all    Not difficult at all    Alcohol screening: Flowsheet Row Office Visit from 01/14/2024 in [facility]  AUDIT-C Score 3    Immunizations and Health Maintenance: Health Maintenance  Topic Date Due   Pneumococcal Vaccine: 50+ Years (1 of 2 - PCV) Never done   COVID-19 Vaccine (5 - 2025-26 season) 01/30/2024 (Originally 01/05/2024)   Zoster Vaccines- Shingrix (1 of 2) 04/02/2024 (Originally 09/01/1991)   Influenza Vaccine  08/03/2024 (Originally 12/05/2023)   Hepatitis B Vaccines 19-59 Average Risk (1 of 3 - 19+ 3-dose series) 12/31/2024 (Originally 09/01/1991)   Mammogram  08/20/2024   Cervical Cancer Screening (HPV/Pap Cotest)  07/17/2027   DTaP/Tdap/Td (8 - Td or Tdap) 12/06/2027   Colonoscopy  09/26/2030   Hepatitis C Screening  Completed   HIV Screening  Completed   HPV VACCINES  Aged Out   Meningococcal B Vaccine  Aged Out    Nocturia   Hep C Screening: done  STD testing and prevention (HIV/chl/gon/syphilis):  see above, no additional testing desired by pt today  Intimate partner violence:safe  Sexual History/Pain during Intercourse: Married vaginal dryness   Menstrual History/LMP/Abnormal Bleeding:  No LMP recorded. Patient has had an ablation.  Incontinence Symptoms: some chronic mild sx, cannot hold, occasionally leak  Breast cancer: UTD due next april Last Mammogram: *see HM list above BRCA gene screening: n/a  Cervical cancer screening:  UTD reviewed last with OBGYN  Osteoporosis:   Discussion on osteoporosis per age, including high calcium and vitamin D  supplementation, weight bearing exercises Past bone density - recent with GYN - will review  Skin cancer:  Hx of skin CA -  NO  Discussed atypical lesions   Colorectal cancer:   Colonoscopy is UTD Discussed concerning signs and sx of CRC, pt denies sx/concerns   Lung cancer:   Low Dose CT Chest recommended if Age 39-80 years, 20 pack-year currently smoking OR have quit w/in  15years. Patient does not qualify.    Social History   Tobacco Use   Smoking status: Never   Smokeless tobacco: Never  Vaping Use   Vaping status: Never Used  Substance Use Topics   Alcohol use: Yes    Alcohol/week: 2.0 standard drinks of alcohol    Types: 2 Glasses of wine per week    Comment: twice a week   Drug use: No     Flowsheet Row Office Visit from 01/14/2024 in [facility]  AUDIT-C Score 3    Family History  Problem Relation Age of Onset   Hypertension Mother    Thyroid  disease Mother    Kidney disease Mother    Hypertension Father    Cancer Father        Prostate   Diabetes Brother    Diabetes Other    Cancer Paternal Uncle        Prostate   Diabetes Maternal Grandmother    Cancer Paternal Grandfather        Prostate   Diabetes Sister    Schizophrenia Sister    Breast cancer Neg Hx    Ovarian cancer Neg Hx    Heart disease Neg Hx      Blood pressure/Hypertension: BP Readings from Last 3 Encounters:  01/14/24 124/72  10/14/23 110/72  08/22/23 122/80    Weight/Obesity: Wt Readings from Last 3 Encounters:  01/14/24 186 lb (84.4 kg)  10/14/23 183 lb (83 kg)  08/22/23 180 lb 3.2 oz (81.7 kg)   BMI Readings from Last 3 Encounters:  01/14/24 28.28 kg/m  10/14/23 27.83 kg/m  08/22/23 27.40 kg/m     Lipids:  Lab Results  Component Value Date   CHOL 212 (H) 12/16/2022   CHOL 239 (H) 12/10/2021   CHOL 216 (H) 12/04/2020   Lab Results  Component Value Date   HDL 52 12/16/2022   HDL 68 12/10/2021   HDL 58 12/04/2020   Lab Results  Component Value Date   LDLCALC 140 (H) 12/16/2022   LDLCALC 152 (H) 12/10/2021   LDLCALC 138 (H) 12/04/2020   Lab Results  Component Value Date   TRIG 95 12/16/2022   TRIG 89 12/10/2021   TRIG 94 12/04/2020   Lab Results  Component Value Date   CHOLHDL 4.1 12/16/2022   CHOLHDL 3.5 12/10/2021   CHOLHDL 3.7 12/04/2020   No results found for: LDLDIRECT Based on the  results of lipid panel his/her cardiovascular risk factor ( using Poole Cohort )  in the next 10 years is: The 10-year ASCVD risk score (Arnett DK, et al., 2019) is: 3.7%   Values used to calculate the score:     Age: 28 years  Clincally relevant sex: Female     Is Non-Hispanic African American: Yes     Diabetic: No     Tobacco smoker: No     Systolic Blood Pressure: 124 mmHg     Is BP treated: Yes     HDL Cholesterol: 52 mg/dL     Total Cholesterol: 212 mg/dL  Glucose:  Glucose, Bld  Date Value Ref Range Status  04/02/2023 78 65 - 99 mg/dL Final    Comment:    .            Fasting reference interval .   12/16/2022 79 65 - 99 mg/dL Final    Comment:    .            Fasting reference interval .   09/04/2022 88 70 - 99 mg/dL Final    Comment:    Glucose reference range applies only to samples taken after fasting for at least 8 hours.    Advanced Care Planning:  A voluntary discussion about advance care planning including the explanation and discussion of advance directives.   Discussed health care proxy and Living will, and the patient was able to identify a health care proxy as spouse.     Social History       Social History   Socioeconomic History   Marital status: Married    Spouse name: Ron    Number of children: 4   Years of education: Not on file   Highest education level: Bachelor's degree (e.g., BA, AB, BS)  Occupational History   Occupation: Teacher  Tobacco Use   Smoking status: Never   Smokeless tobacco: Never  Vaping Use   Vaping status: Never Used  Substance and Sexual Activity   Alcohol use: Yes    Alcohol/week: 2.0 standard drinks of alcohol    Types: 2 Glasses of wine per week    Comment: twice a week   Drug use: No   Sexual activity: Yes    Partners: Male    Birth control/protection: Surgical, Post-menopausal    Comment: abalation  Other Topics Concern   Not on file  Social History Narrative   Not on file   Social Drivers of  Health   Financial Resource Strain: Low Risk  (01/14/2024)   Overall Financial Resource Strain (CARDIA)    Difficulty of Paying Living Expenses: Not hard at all  Food Insecurity: No Food Insecurity (01/14/2024)   Hunger Vital Sign    Worried About Running Out of Food in the Last Year: Never true    Ran Out of Food in the Last Year: Never true  Transportation Needs: No Transportation Needs (01/14/2024)   PRAPARE - Administrator, Civil Service (Medical): No    Lack of Transportation (Non-Medical): No  Physical Activity: Sufficiently Active (01/14/2024)   Exercise Vital Sign    Days of Exercise per Week: 5 days    Minutes of Exercise per Session: 40 min  Stress: No Stress Concern Present (01/14/2024)   [employer] of Occupational Health - Occupational Stress Questionnaire    Feeling of Stress: Not at all  Social Connections: Moderately Integrated (01/14/2024)   Social Connection and Isolation Panel    Frequency of Communication with Friends and Family: More than three times a week    Frequency of Social Gatherings with Friends and Family: More than three times a week    Attends Religious Services: Never    Database administrator or Organizations: Yes  Attends Engineer, structural: More than 4 times per year    Marital Status: Married    Family History        Family History  Problem Relation Age of Onset   Hypertension Mother    Thyroid  disease Mother    Kidney disease Mother    Hypertension Father    Cancer Father        Prostate   Diabetes Brother    Diabetes Other    Cancer Paternal Uncle        Prostate   Diabetes Maternal Grandmother    Cancer Paternal Grandfather        Prostate   Diabetes Sister    Schizophrenia Sister    Breast cancer Neg Hx    Ovarian cancer Neg Hx    Heart disease Neg Hx     Patient Active Problem List   Diagnosis Date Noted   Senile nuclear sclerosis 03/25/2022   Spinal stenosis of lumbar region 11/28/2021    Prediabetes 12/04/2020   Mixed hyperlipidemia 12/04/2020   Hypertension, benign 12/03/2019   Fibrocystic breast changes, bilateral 12/03/2019   Mild episode of recurrent major depressive disorder (HCC) 03/26/2019   Obstructive sleep apnea 12/30/2018   Vitamin B12 deficiency 01/08/2018   Vitamin D  deficiency 01/08/2018   Positive ANA (antinuclear antibody) 12/15/2017   Arthralgia 12/15/2017   Dyspnea on exertion 12/15/2017   Primary osteoarthritis of right knee 05/13/2017   Overweight (BMI 25.0-29.9) 11/19/2016   White coat syndrome with hypertension 01/25/2016   Allergic rhinitis, seasonal 01/10/2015   Asymptomatic varicose veins of both lower extremities 01/10/2015    Past Surgical History:  Procedure Laterality Date   CATARACT EXTRACTION W/ INTRAOCULAR LENS IMPLANT Left 03/26/2022   CHOLECYSTECTOMY N/A 01/19/2016   Procedure: LAPAROSCOPIC CHOLECYSTECTOMY WITH INTRAOPERATIVE CHOLANGIOGRAM;  Surgeon: Louanne KANDICE Muse, MD;  Location: [facility] ORS;  Service: General;  Laterality: N/A;   DILITATION & CURRETTAGE/HYSTROSCOPY WITH NOVASURE ABLATION N/A 03/24/2017   Procedure: DILATATION & CURETTAGE/HYSTEROSCOPY WITH NOVASURE ABLATION;  Surgeon: Kathe Gladis LABOR, MD;  Location: [facility] ORS;  Service: Gynecology;  Laterality: N/A;   laser surgery on eye Right 10/2016     Current Outpatient Medications:    Adalimumab-ryvk, 2 Pen, 40 MG/0.4ML AJKT, Inject 40 mg into the skin every 14 (fourteen) days., Disp: , Rfl:    azaTHIOprine (IMURAN) 50 MG tablet, Take 100 mg by mouth daily., Disp: , Rfl:    celecoxib (CELEBREX) 200 MG capsule, Take 200 mg by mouth 2 (two) times daily., Disp: , Rfl:    Difluprednate 0.05 % EMUL, Place 1 drop into both eyes daily., Disp: , Rfl:    dorzolamide-timolol (COSOPT) 2-0.5 % ophthalmic solution, Place 1 drop into both eyes 2 (two) times daily., Disp: , Rfl:    estradiol  (ESTRACE ) 0.1 MG/GM vaginal cream, Place 1 Applicatorful vaginally 3 (three) times a  week., Disp: 42.5 g, Rfl: 12   trimethoprim -polymyxin b  (POLYTRIM ) ophthalmic solution, Put 1 drop in affected eye(s) every 4 hours while awake for 1 week, Disp: 10 mL, Rfl: 0   amLODipine  (NORVASC ) 5 MG tablet, Take 1 tablet (5 mg total) by mouth daily., Disp: 90 tablet, Rfl: 3   estradiol -norethindrone  (ACTIVELLA) 1-0.5 MG tablet, Take 1 tablet by mouth daily., Disp: 90 tablet, Rfl: 3   losartan  (COZAAR ) 100 MG tablet, Take 1 tablet (100 mg total) by mouth daily., Disp: 90 tablet, Rfl: 3  Allergies  Allergen Reactions   Naltrexone -Bupropion  Hcl Er Hypertension and Other (See Comments)  Palpitations, hypertension  Other Reaction(s): Hypertension, Not available  bupropion  / naltrexone     Patient Care Team: Leavy Mole, PA-C as PCP - General (Family Medicine) Darliss Rogue, MD as PCP - Cardiology (Cardiology) Lada, Newell SQUIBB, MD as Attending Physician (Family Medicine) Dellie Louanne MATSU, MD (General Surgery)   Chart Review: I personally reviewed active problem list, medication list, allergies, family history, social history, health maintenance, notes from last encounter, lab results, imaging with the patient/caregiver today.   Review of Systems  Constitutional: Negative.   HENT: Negative.    Eyes: Negative.   Respiratory: Negative.    Cardiovascular: Negative.   Gastrointestinal: Negative.   Endocrine: Negative.   Genitourinary: Negative.   Musculoskeletal: Negative.   Skin: Negative.   Allergic/Immunologic: Negative.   Neurological: Negative.   Hematological: Negative.   Psychiatric/Behavioral: Negative.    All other systems reviewed and are negative.         Objective:   Vitals:  Vitals:   01/14/24 1526  BP: 124/72  Pulse: 67  Resp: 16  SpO2: 99%  Weight: 186 lb (84.4 kg)  Height: 5' 8 (1.727 m)    Body mass index is 28.28 kg/m.  Physical Exam Vitals and nursing note reviewed.  Constitutional:      General: She is not in acute  distress.    Appearance: Normal appearance. She is well-developed. She is not ill-appearing, toxic-appearing or diaphoretic.  HENT:     Head: Normocephalic and atraumatic.     Right Ear: External ear normal.     Left Ear: External ear normal.     Nose: Nose normal. No congestion or rhinorrhea.     Mouth/Throat:     Mouth: Mucous membranes are moist.     Pharynx: Oropharynx is clear. Uvula midline. No oropharyngeal exudate or posterior oropharyngeal erythema.  Eyes:     General: Lids are normal. No scleral icterus.       Right eye: No discharge.        Left eye: No discharge.     Pupils: Pupils are equal, round, and reactive to light.     Comments: Eyes bilateral injection  Neck:     Trachea: Phonation normal. No tracheal deviation.  Cardiovascular:     Rate and Rhythm: Normal rate and regular rhythm.     Pulses: Normal pulses.          Radial pulses are 2+ on the right side and 2+ on the left side.       Posterior tibial pulses are 2+ on the right side and 2+ on the left side.     Heart sounds: Normal heart sounds. No murmur heard.    No friction rub. No gallop.  Pulmonary:     Effort: Pulmonary effort is normal. No respiratory distress.     Breath sounds: Normal breath sounds. No stridor. No wheezing, rhonchi or rales.  Chest:     Chest wall: No tenderness.  Abdominal:     General: Bowel sounds are normal. There is no distension.     Palpations: Abdomen is soft.     Tenderness: There is no abdominal tenderness. There is no guarding or rebound.  Musculoskeletal:        General: No deformity. Normal range of motion.     Cervical back: Normal range of motion and neck supple.     Right lower leg: No edema.     Left lower leg: No edema.  Lymphadenopathy:     Cervical: No cervical adenopathy.  Skin:    General: Skin is warm and dry.     Capillary Refill: Capillary refill takes less than 2 seconds.     Coloration: Skin is not pale.     Findings: No rash.  Neurological:      Mental Status: She is alert and oriented to person, place, and time.     Motor: No abnormal muscle tone.     Coordination: Coordination normal.     Gait: Gait normal.  Psychiatric:        Mood and Affect: Mood normal.        Speech: Speech normal.        Behavior: Behavior normal.       Fall Risk:    01/14/2024    3:26 PM 08/22/2023    9:53 AM 04/02/2023   11:46 AM 03/26/2023   11:42 AM 12/16/2022    1:37 PM  Fall Risk   Falls in the past year? 0 0 0 0 0  Number falls in past yr: 0 0 0 0 0  Injury with Fall? 0 0 0 0 0  Risk for fall due to : No Fall Risks  No Fall Risks No Fall Risks No Fall Risks  Follow up Falls prevention discussed Falls evaluation completed  Falls prevention discussed Falls prevention discussed;Education provided;Falls evaluation completed    Functional Status Survey: Is the patient deaf or have difficulty hearing?: No Does the patient have difficulty seeing, even when wearing glasses/contacts?: No Does the patient have difficulty concentrating, remembering, or making decisions?: No Does the patient have difficulty walking or climbing stairs?: No Does the patient have difficulty dressing or bathing?: No Does the patient have difficulty doing errands alone such as visiting a doctor's office or shopping?: No   Assessment & Plan:    CPE completed today  USPSTF grade A and B recommendations reviewed with patient; age-appropriate recommendations, preventive care, screening tests, etc discussed and encouraged; healthy living encouraged; see AVS for patient education given to patient  Discussed importance of 150 minutes of physical activity weekly, AHA exercise recommendations given to pt in AVS/handout  Discussed importance of healthy diet:  eating lean meats and proteins, avoiding trans fats and saturated fats, avoid simple sugars and excessive carbs in diet, eat 6 servings of fruit/vegetables daily and drink plenty of water and avoid sweet beverages.     Recommended pt to do annual eye exam and routine dental exams/cleanings  Depression, alcohol, fall screening completed as documented above and per flowsheets  Advance Care planning information and packet discussed and offered today, encouraged pt to discuss with family members/spouse/partner/friends and complete Advanced directive packet and bring copy to office   Reviewed Health Maintenance: Health Maintenance  Topic Date Due   Pneumococcal Vaccine: 50+ Years (1 of 2 - PCV) Never done   COVID-19 Vaccine (5 - 2025-26 season) 01/30/2024 (Originally 01/05/2024)   Zoster Vaccines- Shingrix (1 of 2) 04/02/2024 (Originally 09/01/1991)   Influenza Vaccine  08/03/2024 (Originally 12/05/2023)   Hepatitis B Vaccines 19-59 Average Risk (1 of 3 - 19+ 3-dose series) 12/31/2024 (Originally 09/01/1991)   Mammogram  08/20/2024   Cervical Cancer Screening (HPV/Pap Cotest)  07/17/2027   DTaP/Tdap/Td (8 - Td or Tdap) 12/06/2027   Colonoscopy  09/26/2030   Hepatitis C Screening  Completed   HIV Screening  Completed   HPV VACCINES  Aged Out   Meningococcal B Vaccine  Aged Out    Immunizations: Immunization History  Administered Date(s) Administered   Dtap, Unspecified 11/03/1972,  12/20/1972, 03/02/1973, 08/24/1976, 02/04/1978   Influenza, Seasonal, Injecte, Preservative Fre 03/26/2023   Influenza,inj,Quad PF,6+ Mos 01/25/2016, 12/26/2017, 02/24/2019, 03/14/2020   Influenza,inj,quad, With Preservative 02/04/2020   Influenza-Unspecified 03/08/2002, 03/07/2017, 05/06/2021   MMR 12/24/1973, 11/23/2012   PFIZER(Purple Top)SARS-COV-2 Vaccination 12/13/2019, 01/03/2020, 06/11/2020   PPD Test 11/20/2015   Polio, Unspecified 11/03/1972, 12/20/1972, 03/02/1973, 08/24/1976, 02/04/1978   Tdap 11/23/2012, 12/05/2017   Unspecified SARS-COV-2 Vaccination 06/06/2020   Vaccines:  HPV: up to at age 17 , ask insurance if age between 87-45  Shingrix: 68-51 yo and ask insurance if covered when patient above 25  yo Pneumonia: due - discussion educated and discussed with patient. Flu: due -  educated and discussed with patient. COVID:      ICD-10-CM   1. Adult general medical exam  Z00.00 CBC with Differential/Platelet    Comprehensive metabolic panel with GFR    Hemoglobin A1c    Lipid panel    TSH    2. Postmenopausal hormone therapy  Z79.890 estradiol -norethindrone  (ACTIVELLA) 1-0.5 MG tablet   refill on cuirrent HRT since OBGYN dr. Janit has left his practice    3. 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 Vaccine counseling  Z71.85    q's about her current immunosuppresive meds and pneumococcal vaccine use/ind- explained I will research and let her know, she may also ask prescriber/pharm         Check for pneumococcal recommendations while on her current meds     Michelene Cower, PA-C 01/14/24 7:53 PM  [facility]

## 2024-01-14 NOTE — Patient Instructions (Signed)
 Health Maintenance  Topic Date Due   Pneumococcal Vaccine for age over 64 (1 of 2 - PCV) Never done   COVID-19 Vaccine (5 - 2025-26 season) 01/05/2024   Zoster (Shingles) Vaccine (1 of 2) 04/02/2024*   Flu Shot  08/03/2024*   Hepatitis B Vaccine (1 of 3 - 19+ 3-dose series) 12/31/2024*   Mammogram  08/20/2024   Pap with HPV screening  07/17/2027   DTaP/Tdap/Td vaccine (8 - Td or Tdap) 12/06/2027   Colon Cancer Screening  09/26/2030   Hepatitis C Screening  Completed   HIV Screening  Completed   HPV Vaccine  Aged Out   Meningitis B Vaccine  Aged Out  *Topic was postponed. The date shown is not the original due date.

## 2024-01-15 ENCOUNTER — Other Ambulatory Visit: Payer: Self-pay

## 2024-01-15 DIAGNOSIS — N952 Postmenopausal atrophic vaginitis: Secondary | ICD-10-CM

## 2024-01-15 LAB — COMPREHENSIVE METABOLIC PANEL WITH GFR
AG Ratio: 1.5 (calc) (ref 1.0–2.5)
ALT: 10 U/L (ref 6–29)
AST: 15 U/L (ref 10–35)
Albumin: 4.4 g/dL (ref 3.6–5.1)
Alkaline phosphatase (APISO): 48 U/L (ref 37–153)
BUN: 10 mg/dL (ref 7–25)
CO2: 28 mmol/L (ref 20–32)
Calcium: 9.5 mg/dL (ref 8.6–10.4)
Chloride: 103 mmol/L (ref 98–110)
Creat: 0.86 mg/dL (ref 0.50–1.03)
Globulin: 3 g/dL (ref 1.9–3.7)
Glucose, Bld: 78 mg/dL (ref 65–99)
Potassium: 4.1 mmol/L (ref 3.5–5.3)
Sodium: 137 mmol/L (ref 135–146)
Total Bilirubin: 0.5 mg/dL (ref 0.2–1.2)
Total Protein: 7.4 g/dL (ref 6.1–8.1)
eGFR: 82 mL/min/1.73m2 (ref >=60)

## 2024-01-15 LAB — CBC WITH DIFFERENTIAL/PLATELET
Absolute Lymphocytes: 2442 {cells}/uL (ref 850–3900)
Absolute Monocytes: 400 {cells}/uL (ref 200–950)
Basophils Absolute: 40 {cells}/uL (ref 0–200)
Basophils Relative: 0.9 %
Eosinophils Absolute: 79 {cells}/uL (ref 15–500)
Eosinophils Relative: 1.8 %
HCT: 42.9 % (ref 35.0–45.0)
Hemoglobin: 14.3 g/dL (ref 11.7–15.5)
MCH: 31.4 pg (ref 27.0–33.0)
MCHC: 33.3 g/dL (ref 32.0–36.0)
MCV: 94.1 fL (ref 80.0–100.0)
MPV: 10.8 fL (ref 7.5–12.5)
Monocytes Relative: 9.1 %
Neutro Abs: 1439 {cells}/uL — ABNORMAL LOW (ref 1500–7800)
Neutrophils Relative %: 32.7 %
Platelets: 340 Thousand/uL (ref 140–400)
RBC: 4.56 Million/uL (ref 3.80–5.10)
RDW: 12.4 % (ref 11.0–15.0)
Total Lymphocyte: 55.5 %
WBC: 4.4 Thousand/uL (ref 3.8–10.8)

## 2024-01-15 LAB — HEMOGLOBIN A1C
Hgb A1c MFr Bld: 5.6 % (ref <5.7)
Mean Plasma Glucose: 114 mg/dL
eAG (mmol/L): 6.3 mmol/L

## 2024-01-15 LAB — LIPID PANEL
Cholesterol: 233 mg/dL — ABNORMAL HIGH (ref <200)
HDL: 67 mg/dL (ref >=50)
LDL Cholesterol (Calc): 148 mg/dL — ABNORMAL HIGH
Non-HDL Cholesterol (Calc): 166 mg/dL — ABNORMAL HIGH (ref <130)
Total CHOL/HDL Ratio: 3.5 (calc) (ref <5.0)
Triglycerides: 79 mg/dL (ref <150)

## 2024-01-15 LAB — TSH: TSH: 0.72 m[IU]/L

## 2024-01-15 MED ORDER — ESTRADIOL 0.1 MG/GM VA CREA: 1.0000 | TOPICAL_CREAM | VAGINAL | 3 refills | Status: DC

## 2024-01-16 ENCOUNTER — Ambulatory Visit: Payer: Self-pay | Admitting: Family Medicine

## 2024-01-17 ENCOUNTER — Other Ambulatory Visit: Payer: Self-pay | Admitting: Family Medicine

## 2024-01-17 DIAGNOSIS — D84821 Immunodeficiency due to drugs: Secondary | ICD-10-CM | POA: Insufficient documentation

## 2024-01-17 DIAGNOSIS — Z7185 Encounter for immunization safety counseling: Secondary | ICD-10-CM

## 2024-01-17 DIAGNOSIS — H30033 Focal chorioretinal inflammation, peripheral, bilateral: Secondary | ICD-10-CM | POA: Insufficient documentation

## 2024-01-17 DIAGNOSIS — H3581 Retinal edema: Secondary | ICD-10-CM | POA: Insufficient documentation

## 2024-01-17 NOTE — Progress Notes (Signed)
 Consulting with [facility] pharmacist for pt immunocompromised state and questions about vaccinations    ICD-10-CM   1. Vaccine counseling  Z71.85 AMB Referral [facility] Care Management    2. 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 Peripheral focal chorioretinal inflammation of both eyes  H30.033 AMB Referral [facility] Care Management    4. Retinal edema  H35.81 AMB Referral [facility] Care Management     Msg sent to [facility] pharmacist and referral put in

## 2024-01-20 ENCOUNTER — Telehealth: Payer: Self-pay

## 2024-01-20 NOTE — Progress Notes (Unsigned)
 Complex Care Management Note Care Guide Note  01/20/2024 Name: Andrea Pope MRN: [id number] DOB: June 03, 1970   Complex Care Management Outreach Attempts: An unsuccessful telephone outreach was attempted today to offer the patient information about available complex care management services.  Follow Up Plan:  Additional outreach attempts will be made to offer the patient complex care management information and services.   Encounter Outcome:  No Answer  Dreama [facility]  [facility] Assistant Direct Dial: [phone]  Fax: [phone]

## 2024-01-22 ENCOUNTER — Telehealth: Payer: Self-pay

## 2024-01-22 DIAGNOSIS — N952 Postmenopausal atrophic vaginitis: Secondary | ICD-10-CM

## 2024-01-22 NOTE — Progress Notes (Signed)
 Complex Care Management Note  Care Guide Note 01/22/2024 Name: Andrea Pope MRN: [id number] DOB: 1971-04-05  Andrea Pope is a 53 y.o. year old female who sees Leavy Mole, PA-C for primary care. I reached out to Andrea Pope by phone today to offer complex care management services.  [name] was given information about Complex Care Management services today including:   The Complex Care Management services include support from the care team which includes your Nurse Care Manager, Clinical Social Worker, or Pharmacist.  The Complex Care Management team is here to help remove barriers to the health concerns and goals most important to you. Complex Care Management services are voluntary, and the patient may decline or stop services at any time by request to their care team member.   Complex Care Management Consent Status: Patient agreed to services and verbal consent obtained.   Follow up plan:  Telephone appointment with complex care management team member scheduled for:  02/03/24 at 3:00 p.m.   Encounter Outcome:  Patient Scheduled  Dreama [facility]  [facility]BCI Assistant Direct Dial: [phone]  Fax: [phone]

## 2024-01-22 NOTE — Telephone Encounter (Unsigned)
 Copied from CRM [id number]. Topic: Clinical - Prescription Issue >> Jan 22, 2024  1:56 PM Carlatta H wrote: Reason for CRM: The pharmacy would the prescription resent for estradiol  (ESTRACE ) 0.1 MG/GM vaginal cream [[id number]]//Needs clearer instructions

## 2024-01-22 NOTE — Progress Notes (Signed)
 Complex Care Management Note Care Guide Note  01/22/2024 Name: Andrea Pope MRN: [id number] DOB: 1970-12-13   Complex Care Management Outreach Attempts: A second unsuccessful outreach was attempted today to offer the patient with information about available complex care management services.  Follow Up Plan:  Additional outreach attempts will be made to offer the patient complex care management information and services.   Encounter Outcome:  Patient Request to Call Back  [facility]  [facility], [place] VBCI Assistant Direct Dial: [phone]  Fax: [phone]

## 2024-01-23 MED ORDER — ESTRADIOL 0.1 MG/GM VA CREA: 1.0000 | TOPICAL_CREAM | VAGINAL | 3 refills | Status: DC

## 2024-01-27 ENCOUNTER — Ambulatory Visit: Payer: Self-pay | Admitting: Family Medicine

## 2024-01-27 ENCOUNTER — Ambulatory Visit
Admission: RE | Admit: 2024-01-27 | Discharge: 2024-01-27 | Disposition: A | Discharge status: Home / Self Care | Attending: Family Medicine | Admitting: Family Medicine

## 2024-01-27 ENCOUNTER — Encounter: Payer: Self-pay | Admitting: Family Medicine

## 2024-01-27 ENCOUNTER — Ambulatory Visit
Admission: RE | Admit: 2024-01-27 | Discharge: 2024-01-27 | Disposition: A | Discharge status: Home / Self Care | Source: Ambulatory Visit | Attending: Family Medicine | Admitting: Family Medicine

## 2024-01-27 ENCOUNTER — Ambulatory Visit: Admitting: Family Medicine

## 2024-01-27 VITALS — BP 120/72 | HR 88 | Temp 99.2°F | Resp 16 | Ht 68.0 in | Wt 186.0 lb

## 2024-01-27 DIAGNOSIS — R0602 Shortness of breath: Secondary | ICD-10-CM | POA: Diagnosis present

## 2024-01-27 DIAGNOSIS — D84821 Immunodeficiency due to drugs: Secondary | ICD-10-CM | POA: Insufficient documentation

## 2024-01-27 DIAGNOSIS — J069 Acute upper respiratory infection, unspecified: Secondary | ICD-10-CM

## 2024-01-27 DIAGNOSIS — H6501 Acute serous otitis media, right ear: Secondary | ICD-10-CM | POA: Diagnosis not present

## 2024-01-27 DIAGNOSIS — Z79899 Other long term (current) drug therapy: Secondary | ICD-10-CM | POA: Diagnosis present

## 2024-01-27 DIAGNOSIS — J209 Acute bronchitis, unspecified: Secondary | ICD-10-CM | POA: Insufficient documentation

## 2024-01-27 LAB — POC COVID19/FLU A&B COMBO
Covid Antigen, POC: NEGATIVE
Influenza A Antigen, POC: NEGATIVE
Influenza B Antigen, POC: NEGATIVE

## 2024-01-27 MED ORDER — PREDNISONE 20 MG PO TABS: ORAL_TABLET | ORAL | 0 refills | Status: DC

## 2024-01-27 MED ORDER — ALBUTEROL SULFATE (2.5 MG/3ML) 0.083% IN NEBU
2.5000 mg | INHALATION_SOLUTION | Freq: Once | RESPIRATORY_TRACT | Status: AC
  Administered 2024-01-27: 2.5 mg via RESPIRATORY_TRACT

## 2024-01-27 MED ORDER — LEVOCETIRIZINE DIHYDROCHLORIDE 5 MG PO TABS: 5.0000 mg | ORAL_TABLET | Freq: Every evening | ORAL | 1 refills | Status: DC

## 2024-01-27 MED ORDER — GUAIFENESIN ER 600 MG PO TB12: 600.0000 mg | ORAL_TABLET | Freq: Two times a day (BID) | ORAL | Status: DC | PRN

## 2024-01-27 MED ORDER — ALBUTEROL SULFATE HFA 108 (90 BASE) MCG/ACT IN AERS: 2.0000 | INHALATION_SPRAY | Freq: Four times a day (QID) | RESPIRATORY_TRACT | 0 refills | Status: DC | PRN

## 2024-01-27 NOTE — Progress Notes (Signed)
 Patient ID: ALAYNE ESTRELLA, female    DOB: [birth year], 53 y.o.   MRN: [id number]  PCP: Leavy Mole, PA-C  Chief Complaint  Patient presents with   Cough    X5 days. Neg flu, RSV, and COVID test.   Ear Fullness    Subjective:   AVIV ROTA is a 53 y.o. female, presents to clinic with CC of the following:  Cough  Ear Fullness  Associated symptoms include coughing.    URI viral sx x 4 d, checked at UC 2 d ago, all testing negative, given cough syrup and ibuprofen , sx worse dry cough tight chest sore ribs SOB and weak with body aches, decreased energy, low grade fever Immunocompromised hx of bronchitis Right ear hurts  Discussed the use of AI scribe software for clinical note transcription with the patient, who gave verbal consent to proceed.  History of Present Illness KAJUANA SHAREEF is a 53 year old female with a history of bronchitis who presents with respiratory symptoms and ear pain.  Respiratory symptoms - Onset of symptoms in May of last year, with recurrence beginning last Friday - Severe cough causing sore muscles and difficulty sleeping, especially worsening at night - Chest pain likely secondary to persistent coughing - Shortness of breath without wheezing - History of recurrent bronchitis with several episodes over the past few years; current symptoms similar to prior episodes - Cough syrup provides partial relief, limited by dosing frequency - No inhaler currently in use, but has used one in the past - No nausea or diarrhea - No facial pain  Otic symptoms - Right ear pain with sensation of clogged ear - Bilateral ear fullness, more pronounced on the right  Constitutional symptoms - Low-grade fevers, highest recorded temperature 99.41F - Generalized weakness  Medication use and allergic history - Currently taking cough syrup, ibuprofen , Flonase , and chloracetam - No current use of medications for seasonal allergies - Scheduled to take  Humira injection for uveitis on Thursday, which may increase susceptibility to infections  Exposure risk - Works around children - Uncertain exposure to individuals with flu or other respiratory illnesses  Infectious disease testing - COVID-19, RSV, and influenza testing performed at urgent care less than two days after symptom onset; all results negative - No retesting since initial evaluation    Patient Active Problem List   Diagnosis Date Noted   Immunocompromised state due to drug therapy 01/17/2024   Peripheral focal chorioretinal inflammation of both eyes 01/17/2024   Retinal edema 01/17/2024   Postmenopause atrophic vaginitis 01/14/2024   Postmenopausal hormone therapy 01/14/2024   Mixed stress and urge urinary incontinence 01/14/2024   Senile nuclear sclerosis 03/25/2022   Spinal stenosis of lumbar region 11/28/2021   Prediabetes 12/04/2020   Mixed hyperlipidemia 12/04/2020   Hypertension, benign 12/03/2019   Fibrocystic breast changes, bilateral 12/03/2019   Mild episode of recurrent major depressive disorder 03/26/2019   Obstructive sleep apnea 12/30/2018   Vitamin B12 deficiency 01/08/2018   Vitamin D  deficiency 01/08/2018   Positive ANA (antinuclear antibody) 12/15/2017   Arthralgia 12/15/2017   Dyspnea on exertion 12/15/2017   Primary osteoarthritis of right knee 05/13/2017   Overweight (BMI 25.0-29.9) 11/19/2016   White coat syndrome with hypertension 01/25/2016   Allergic rhinitis, seasonal 01/10/2015   Asymptomatic varicose veins of both lower extremities 01/10/2015      Current Outpatient Medications:    Adalimumab-ryvk, 2 Pen, 40 MG/0.4ML AJKT, Inject 40 mg into the skin every 14 (fourteen) days., Disp: ,  Rfl:    albuterol  (VENTOLIN  HFA) 108 (90 Base) MCG/ACT inhaler, Inhale 2 puffs into the lungs every 6 (six) hours as needed for wheezing or shortness of breath., Disp: 8 g, Rfl: 0   amLODipine  (NORVASC ) 5 MG tablet, Take 1 tablet (5 mg total) by mouth  daily., Disp: 90 tablet, Rfl: 3   azaTHIOprine (IMURAN) 50 MG tablet, Take 100 mg by mouth daily., Disp: , Rfl:    celecoxib (CELEBREX) 200 MG capsule, Take 200 mg by mouth 2 (two) times daily., Disp: , Rfl:    Difluprednate 0.05 % EMUL, Place 1 drop into both eyes daily., Disp: , Rfl:    dorzolamide-timolol (COSOPT) 2-0.5 % ophthalmic solution, Place 1 drop into both eyes 2 (two) times daily., Disp: , Rfl:    estradiol  (ESTRACE ) 0.1 MG/GM vaginal cream, Place 1 Applicatorful vaginally 3 (three) times a week., Disp: 42.5 g, Rfl: 3   estradiol -norethindrone  (ACTIVELLA) 1-0.5 MG tablet, Take 1 tablet by mouth daily., Disp: 90 tablet, Rfl: 3   guaiFENesin  (MUCINEX ) 600 MG 12 hr tablet, Take 1 tablet (600 mg total) by mouth 2 (two) times daily as needed for cough or to loosen phlegm., Disp: , Rfl:    levocetirizine (XYZAL ) 5 MG tablet, Take 1 tablet (5 mg total) by mouth every evening., Disp: 90 tablet, Rfl: 1   losartan  (COZAAR ) 100 MG tablet, Take 1 tablet (100 mg total) by mouth daily., Disp: 90 tablet, Rfl: 3   trimethoprim -polymyxin b  (POLYTRIM ) ophthalmic solution, Put 1 drop in affected eye(s) every 4 hours while awake for 1 week, Disp: 10 mL, Rfl: 0   Allergies  Allergen Reactions   Naltrexone -Bupropion  Hcl Er Hypertension and Other (See Comments)    Palpitations, hypertension  Other Reaction(s): Hypertension, Not available  bupropion  / naltrexone      Social History   Tobacco Use   Smoking status: Never   Smokeless tobacco: Never  Vaping Use   Vaping status: Never Used  Substance Use Topics   Alcohol use: Yes    Alcohol/week: 2.0 standard drinks of alcohol    Types: 2 Glasses of wine per week    Comment: twice a week   Drug use: No      Chart Review Today: I personally reviewed active problem list, medication list, allergies, family history, social history, health maintenance, notes from last encounter, lab results, imaging with the patient/caregiver today.   Review of  Systems  Constitutional: Negative.   HENT: Negative.    Eyes: Negative.   Respiratory:  Positive for cough.   Cardiovascular: Negative.   Gastrointestinal: Negative.   Endocrine: Negative.   Genitourinary: Negative.   Musculoskeletal: Negative.   Skin: Negative.   Allergic/Immunologic: Negative.   Neurological: Negative.   Hematological: Negative.   Psychiatric/Behavioral: Negative.    All other systems reviewed and are negative.      Objective:   Vitals:   01/27/24 0932  BP: 120/72  Pulse: 88  Resp: 16  Temp: 99.2 F (37.3 C)  SpO2: 99%  Weight: 186 lb (84.4 kg)  Height: 5' 8 (1.727 m)    Body mass index is 28.28 kg/m.  Physical Exam Vitals and nursing note reviewed.  Constitutional:      General: She is not in acute distress.    Appearance: Normal appearance. She is well-developed and well-groomed. She is not ill-appearing, toxic-appearing or diaphoretic.  HENT:     Head: Normocephalic and atraumatic.     Right Ear: Ear canal and external ear normal. No drainage  or tenderness. A middle ear effusion is present. There is no impacted cerumen. No hemotympanum. Tympanic membrane is injected. Tympanic membrane is not scarred, perforated, erythematous, retracted or bulging.     Left Ear: Hearing, tympanic membrane, ear canal and external ear normal. No tenderness.  No middle ear effusion. There is no impacted cerumen.     Nose: Nose normal.  Eyes:     General: No scleral icterus.       Right eye: No discharge.        Left eye: No discharge.     Conjunctiva/sclera: Conjunctivae normal.  Neck:     Trachea: No tracheal deviation.  Cardiovascular:     Rate and Rhythm: Normal rate and regular rhythm.     Pulses: Normal pulses.     Heart sounds: Normal heart sounds.  Pulmonary:     Effort: Pulmonary effort is normal. No respiratory distress.     Breath sounds: Normal breath sounds. No stridor. No wheezing, rhonchi or rales.  Abdominal:     General: Bowel sounds are  normal.     Palpations: Abdomen is soft.  Skin:    General: Skin is warm and dry.     Findings: No rash.  Neurological:     Mental Status: She is alert.     Motor: No abnormal muscle tone.     Coordination: Coordination normal.     Gait: Gait normal.  Psychiatric:        Mood and Affect: Mood normal.        Behavior: Behavior normal. Behavior is cooperative.      Results for orders placed or performed in visit on 01/27/24  POC Covid19/Flu A&B Antigen   Collection Time: 01/27/24 10:17 AM  Result Value Ref Range   Influenza A Antigen, POC Negative Negative   Influenza B Antigen, POC Negative Negative   Covid Antigen, POC Negative Negative       Assessment & Plan:     ICD-10-CM   1. Acute bronchitis, unspecified organism  J20.9 albuterol  (VENTOLIN  HFA) 108 (90 Base) MCG/ACT inhaler    guaiFENesin  (MUCINEX ) 600 MG 12 hr tablet    albuterol  (PROVENTIL ) (2.5 MG/3ML) 0.083% nebulizer solution 2.5 mg    DG Chest 2 View    predniSONE  (DELTASONE ) 20 MG tablet   tx with mucinex , cough meds OTC, inhaler and steroids for chest tightness and SOB, screening CXR due to sx and immunocompromise but lungs CTA A&P    2. Right acute serous otitis media, recurrence not specified  H65.01 levocetirizine (XYZAL ) 5 MG tablet   clear fluid with some injection, TM is translucent, injected, intact, tx with antihistamines flonase , watch for worsening abx not indicated today no hx AOM    3. Upper respiratory tract infection, unspecified type  J06.9 levocetirizine (XYZAL ) 5 MG tablet    albuterol  (VENTOLIN  HFA) 108 (90 Base) MCG/ACT inhaler    guaiFENesin  (MUCINEX ) 600 MG 12 hr tablet    POC Covid19/Flu A&B Antigen   sx x 4 d, retesting today neg, supportive and symptomatic tx    4. Immunocompromised state due to drug therapy  D84.821 DG Chest 2 View   Z79.899    cxr, no AOM today, no sinus ttp though pt is at increased risk for infections and need for abx, as of right now abx do not seem indicated,  watchful waiting    5. Shortness of breath  R06.02 DG Chest 2 View    predniSONE  (DELTASONE ) 20 MG tablet   neb  in office today improved breathing sx, tightness/SOB, rx for albuterol , lung CTA A&P before and after neb, no increased WOB     Tx with analgesics, rest, pushing fluids, cough meds, mucinex , tx allergies xyzal /flonase , steroids/albuterol  for SOB/bronchitis, work note out of work for rest of week CXR today with immunocompomise  Watch for any acute worsening and recommend recheck right away if anything worsening or focal to determine need for abx or different tx.  close f/up if any worsening   Michelene Cower, PA-C 01/27/24 10:31 AM

## 2024-01-30 ENCOUNTER — Encounter: Payer: Self-pay | Admitting: Family Medicine

## 2024-02-03 ENCOUNTER — Other Ambulatory Visit (INDEPENDENT_AMBULATORY_CARE_PROVIDER_SITE_OTHER)

## 2024-02-03 DIAGNOSIS — Z7185 Encounter for immunization safety counseling: Secondary | ICD-10-CM

## 2024-02-03 NOTE — Progress Notes (Signed)
   02/03/2024 Name: Andrea Pope MRN: [id number] DOB: 03-01-1971  No chief complaint on file.   Andrea Pope is a 53 y.o. year old female who presented for a telephone visit.   They were referred to the pharmacist by their PCP for assistance in vaccine recommendations.    Subjective:  Care Team: Primary Care Provider: Leavy Mole, PA-C  Medication Access/Adherence  Current Pharmacy:  [facility] [address] [place] - [address] [place] [address] Phone: [phone] Fax: [phone]  [facility] - [place], [place] - [address] [place] [address] Phone: [phone] Fax: [phone]   Health Maintenance  Yearly influenza vaccination: due Td/Tdap vaccination: up to date Pneumonia vaccination: due Shingrix vaccinations: due   Objective:  Lab Results  Component Value Date   HGBA1C 5.6 01/14/2024    Lab Results  Component Value Date   CREATININE 0.86 01/14/2024   BUN 10 01/14/2024   NA 137 01/14/2024   K 4.1 01/14/2024   CL 103 01/14/2024   CO2 28 01/14/2024    Lab Results  Component Value Date   CHOL 233 (H) 01/14/2024   HDL 67 01/14/2024   LDLCALC 148 (H) 01/14/2024   TRIG 79 01/14/2024   CHOLHDL 3.5 01/14/2024    Medications Reviewed Today   Medications were not reviewed in this encounter       Assessment/Plan:   Health Maintenance - Discussed CDC/ACIP recommendations for Pneumonia and Shingles vaccines. Encouraged patient to obtain form her local pharmacy. -Recommended Prevnar20 one dose -Recommend Shingrix 2 dose series   Follow Up Plan:   Alegra Rost E. Marsh, PharmD Clinical Pharmacist [facility] [phone]

## 2024-02-09 ENCOUNTER — Encounter: Payer: Self-pay | Admitting: Nurse Practitioner

## 2024-02-09 ENCOUNTER — Ambulatory Visit (INDEPENDENT_AMBULATORY_CARE_PROVIDER_SITE_OTHER): Admitting: Nurse Practitioner

## 2024-02-09 VITALS — BP 124/68 | HR 86 | Resp 16 | Ht 68.0 in | Wt 190.0 lb

## 2024-02-09 DIAGNOSIS — Z0289 Encounter for other administrative examinations: Secondary | ICD-10-CM

## 2024-02-09 DIAGNOSIS — H209 Unspecified iridocyclitis: Secondary | ICD-10-CM | POA: Diagnosis not present

## 2024-02-09 DIAGNOSIS — D84821 Immunodeficiency due to drugs: Secondary | ICD-10-CM

## 2024-02-09 NOTE — Progress Notes (Signed)
 BP 124/68   Pulse 86   Resp 16   Ht 5' 8 (1.727 m)   Wt 190 lb (86.2 kg)   SpO2 99%   BMI 28.89 kg/m    Subjective:    Patient ID: Andrea Pope, female    DOB: [birth year], 53 y.o.   MRN: [id number]  HPI: Andrea Pope is a 53 y.o. female  Chief Complaint  Patient presents with   FMLA   Discussed the use of AI scribe software for clinical note transcription with the patient, who gave verbal consent to proceed.  History of Present Illness Andrea Pope is a 53 year old female with chronic eye inflammation on immunosuppressive therapy who presents for evaluation of frequent illness due to immunocompromise.  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 - Recently experienced bronchitis, resulting in one week absence from work - Bronchitis episode is the first illness since starting immunosuppressive therapy - Expresses concern regarding potential for increased frequency and prolonged duration of future illnesses due to immunocompromised state - Uncertain about expected illness duration while on current medications         01/14/2024    3:26 PM 08/22/2023    9:53 AM 04/02/2023   11:47 AM  Depression screen PHQ 2/9  Decreased Interest 0 0   Down, Depressed, Hopeless 0 0 0  PHQ - 2 Score 0 0 0  Altered sleeping  0   Tired, decreased energy  0   Change in appetite  0   Feeling bad or failure about yourself   0   Trouble  concentrating  0   Moving slowly or fidgety/restless  0   Suicidal thoughts  0   PHQ-9 Score  0   Difficult doing work/chores  Not difficult at all     Relevant past medical, surgical, family and social history reviewed and updated as indicated. Interim medical history since our last visit reviewed. Allergies and medications reviewed and updated.  Review of Systems  Constitutional: Negative for fever or weight change.  Respiratory: Negative for cough and shortness of breath.   Cardiovascular: Negative for chest pain or palpitations.  Gastrointestinal: Negative for abdominal pain, no bowel changes.  Musculoskeletal: Negative for gait problem or joint swelling.  Skin: Negative for rash.  Neurological: Negative for dizziness or headache.  No other specific complaints in a complete review of systems (except as listed in HPI above).      Objective:      BP 124/68   Pulse 86   Resp 16   Ht 5' 8 (1.727 m)   Wt 190 lb (86.2 kg)   SpO2 99%   BMI 28.89 kg/m    Wt Readings from Last 3 Encounters:  02/09/24 190 lb (86.2 kg)  01/27/24 186 lb (84.4 kg)  01/14/24 186 lb (84.4 kg)    Physical Exam GENERAL: Alert, cooperative, well developed, no acute distress HEENT: Normocephalic, normal oropharynx, moist mucous membranes CHEST: Clear to auscultation bilaterally, No wheezes, rhonchi, or crackles CARDIOVASCULAR: Normal heart rate and rhythm, S1 and S2 normal without murmurs ABDOMEN: Soft, non-tender, non-distended, without organomegaly,  Normal bowel sounds EXTREMITIES: No cyanosis or edema NEUROLOGICAL: Cranial nerves grossly intact, Moves all extremities without gross motor or sensory deficit  Results for orders placed or performed in visit on 01/27/24  POC Covid19/Flu A&B Antigen   Collection Time: 01/27/24 10:17 AM  Result Value Ref Range   Influenza A Antigen, POC Negative Negative   Influenza B Antigen, POC Negative Negative   Covid Antigen, POC Negative Negative           Assessment & Plan:   Problem List Items Addressed This Visit       Other   Immunocompromised state due to drug therapy - Primary   Uveitis   Other Visit Diagnoses       Encounter for completion of form with patient            Assessment and Plan Assessment & Plan Immunosuppression due to medication Experiencing immunosuppression due to Humira and azathioprine, necessary for managing chronic uveitis. Increased susceptibility to infections, especially in childcare setting. - Provide documentation for work regarding immunosuppression and potential for increased illness.  Chronic uveitis Managed with Humira injections every fourteen days and azathioprine. Persistent inflammation of the eyes. - Continue Humira injections every fourteen days.  Acute bronchitis Currently experiencing acute bronchitis since September 22nd. First illness since starting immunosuppressive therapy in April.  Requesting FMLA paperwork filled out due to being immunocompromised and working in a daycare.  She says that she wants FMLA to cover her when she gets an illness.  Intermittent leave.       Follow up plan: Return if symptoms worsen or fail to improve.

## 2024-02-09 NOTE — Addendum Note (Signed)
 Addended by: Sanford Lindblad F on: 02/09/2024 02:38 PM   Modules accepted: Level of Service

## 2024-02-16 ENCOUNTER — Other Ambulatory Visit: Payer: Self-pay | Admitting: Family Medicine

## 2024-02-16 DIAGNOSIS — J069 Acute upper respiratory infection, unspecified: Secondary | ICD-10-CM

## 2024-02-16 DIAGNOSIS — J209 Acute bronchitis, unspecified: Secondary | ICD-10-CM

## 2024-02-18 NOTE — Telephone Encounter (Signed)
 Requested Prescriptions  Pending Prescriptions Disp Refills   albuterol  (VENTOLIN  HFA) 108 (90 Base) MCG/ACT inhaler [Pharmacy Med Name: Albuterol  Sulfate HFA 108 (90 Base) MCG/ACT Inhalation Aerosol Solution] 9 g 0    Sig: INHALE 2 PUFFS BY MOUTH EVERY 6 HOURS AS NEEDED FOR WHEEZING FOR SHORTNESS OF BREATH     Pulmonology:  Beta Agonists 2 Passed - 02/18/2024  4:27 PM      Passed - Last BP in normal range    BP Readings from Last 1 Encounters:  02/09/24 124/68         Passed - Last Heart Rate in normal range    Pulse Readings from Last 1 Encounters:  02/09/24 86         Passed - Valid encounter within last 12 months    Recent Outpatient Visits           1 week ago Immunocompromised state due to drug therapy   [facility] Gareth Mliss FALCON, FNP   3 weeks ago Acute bronchitis, unspecified organism   [facility] Leavy Mole, PA-C   1 month ago Adult general medical exam   [facility] Leavy Mole, PA-C   6 months ago Seasonal allergic rhinitis, unspecified trigger   [facility] Bernardo Fend, DO   8 months ago Eye pain, bilateral   [facility] Leavy Mole, PA-C       Future Appointments             In 4 months Leavy Mole, PA-C [facility], [place]

## 2024-03-10 ENCOUNTER — Other Ambulatory Visit: Payer: Self-pay | Admitting: Nurse Practitioner

## 2024-03-10 ENCOUNTER — Encounter: Payer: Self-pay | Admitting: Nurse Practitioner

## 2024-03-10 DIAGNOSIS — H209 Unspecified iridocyclitis: Secondary | ICD-10-CM

## 2024-03-10 DIAGNOSIS — M2550 Pain in unspecified joint: Secondary | ICD-10-CM

## 2024-04-12 ENCOUNTER — Ambulatory Visit: Admitting: Nurse Practitioner

## 2024-04-19 ENCOUNTER — Other Ambulatory Visit: Payer: Self-pay | Admitting: Rheumatology

## 2024-04-19 DIAGNOSIS — M7918 Myalgia, other site: Secondary | ICD-10-CM

## 2024-04-20 ENCOUNTER — Telehealth: Payer: Self-pay | Admitting: Family Medicine

## 2024-04-20 NOTE — Telephone Encounter (Signed)
 No refills needed, to soon

## 2024-04-20 NOTE — Telephone Encounter (Signed)
amLODipine (NORVASC) 5 MG tablet ° °

## 2024-05-04 ENCOUNTER — Ambulatory Visit: Payer: Self-pay | Admitting: Surgery

## 2024-05-04 MED ORDER — ORAL CARE MOUTH RINSE: 15.0000 mL | Freq: Once | OROMUCOSAL | Status: AC

## 2024-05-04 MED ORDER — CHLORHEXIDINE GLUCONATE 0.12 % MT SOLN
15.0000 mL | Freq: Once | OROMUCOSAL | Status: AC
  Administered 2024-05-05: 15 mL via OROMUCOSAL

## 2024-05-04 MED ORDER — CEFAZOLIN SODIUM-DEXTROSE 2-4 GM/100ML-% IV SOLN
2.0000 g | INTRAVENOUS | Status: AC
  Administered 2024-05-05: 2 g via INTRAVENOUS

## 2024-05-04 MED ORDER — CHLORHEXIDINE GLUCONATE CLOTH 2 % EX PADS: 6.0000 | MEDICATED_PAD | Freq: Once | CUTANEOUS | Status: DC

## 2024-05-04 MED ORDER — LACTATED RINGERS IV SOLN: INTRAVENOUS | Status: DC

## 2024-05-04 NOTE — Progress Notes (Signed)
 Subjective:   CC: Myositis of left forearm, unspecified myositis type [M60.9]  HPI:  referred by Mayur Loree Blanch, MD for evaluation of above.   History of Present Illness Evaluated by rhuem for above concerns.  Here today to discuss biopsy.    Past Medical History:  has a past medical history of Anemia, Dysmenorrhea, Dyspareunia in female, Hyperlipidemia (12/23/2019), Hypertension (11/2019), Neutropenia, Ovarian cyst, and Sleep apnea.  Past Surgical History:  has a past surgical history that includes Cholecystectomy; Hysteroscopy W/Endometrial Ablation Using Novasure; Eye surgery; egd (01/13/2020); and Colonoscopy (09/21/2020).  Family History: family history includes Alzheimer's disease in her maternal grandmother; Cancer in her father, paternal grandfather, and paternal uncle; Dementia in her maternal grandmother; Diabetes in her brother, maternal grandmother, and sister; High blood pressure (Hypertension) in her father and mother; Kidney disease in her mother; Schizophrenia in her sister; Thyroid  disease in her mother.  Social History:  reports that she has never smoked. She has never used smokeless tobacco. She reports current alcohol use. She reports that she does not use drugs.  Current Medications: has a current medication list which includes the following prescription(s): adalimumab-ryvk, albuterol  mdi (proventil , ventolin , proair ) hfa, amlodipine , difluprednate, dorzolamide-timolol, estradiol -norethindrone , fluticasone  propionate, folic acid , gabapentin, losartan , and methotrexate.  Allergies:  Allergies  Allergen Reactions   Naltrexone -Bupropion  Other (See Comments)    Palpitations, hypertension    ROS:  A 15 point review of systems was performed and pertinent positives and negatives noted in HPI   Objective:     BP 130/83   Pulse 73   Ht 172.7 cm (5' 8)   Wt 86.6 kg (191 lb)   LMP  (LMP Unknown)   BMI 29.04 kg/m   Constitutional :  No distress, cooperative,  alert  Lymphatics/Throat:  Supple with no lymphadenopathy  Respiratory:  Clear to auscultation bilaterally  Cardiovascular:  Regular rate and rhythm  Gastrointestinal: Soft, non-tender, non-distended, no organomegaly.  Musculoskeletal: Steady gait and movement  Skin: Cool and moist  Psychiatric: Normal affect, non-agitated, not confused         LABS:  N/a   RADS: MRI RADIUS ULNA LEFT WITH AND WITHOUT CONTRAST   Indication: Soft tissue mass, forearm, deep, Left Forearm Pain, Swelling; Myositis, Fasciitis, M79.18 Myalgia, other site   Comparison: None available at the time of dictation.   Technique: Multiplanar, multiecho imaging was performed, including fluid sensitive and pre and postcontrast T1-weighted sequences; IV contrast was administered to potentially improve disease detection.   Findings:   Bone marrow: No fracture or marrow replacing process. Signal abnormality within the proximal to mid radial diaphysis are vascular in etiology.   Joints: Nondedicated imaging of the elbow and wrist demonstrate no sizable effusions or subchondral signal abnormalities small first CMC and coronoid process osteophytes.   Musculature: Subtle nonspecific enhancement of the multiple muscles in the proximal, mid and distal forearm including the pronator teres in the proximal forearm, pronator quadratus in the distal forearm, and the extensor carpi ulnaris, extensor digitorum, and extensor digitorum minimi in the proximal forearm. Subtle edema in the pronator quadratus muscle. No fatty infiltration or atrophy. Subtle fascial edema without enhancement surrounding the volar compartment musculature in the proximal forearm and elbow. No intramuscular fluid collection.   Additional soft tissues: No subcutaneous fluid collection or soft tissue mass. Neurovascular bundles are intact.   Impression: Subtle enhancement of multiple muscles in the proximal, mid and distal forearm with subtle  edema in the pronator quadratus muscle. No associated fatty infiltration or atrophy. Subtle  fascial edema throughout the volar compartment of the proximal forearm and elbow. The involved muscles do not conform to a specific nerve distribution, making denervation changes unlikely.  Findings are nonspecific but favor a myositis, possibly inflammatory. The absence of prominent soft tissue inflammatory changes makes an infectious etiology less likely.   Electronically Signed by:  Rocky Ghazi, MD, [facility] Electronically Signed on:  04/25/2024 9:18 AM  Assessment:      Myositis of left forearm, unspecified myositis type [M60.9] as noted on recent MRI.  Rheum requesting biopsy  Plan:     1. Myositis of left forearm, unspecified myositis type [M60.9] Discussed surgical excision.  Alternatives include continued observation.  Benefits include possible symptom relief, pathologic evaluation, improved cosmesis. Discussed the risk of surgery including recurrence, chronic pain, post-op infxn, poor cosmesis, poor/delayed wound healing, and possible re-operation to address said risks. The risks of general anesthetic, if used, includes MI, CVA, sudden death or even reaction to anesthetic medications also discussed.  Typical post-op recovery time of 3-5 days with possible activity restrictions were also discussed.  The patient verbalized understanding and all questions were answered to the patient's satisfaction.  2. Patient has elected to proceed with surgical treatment. Procedure will be scheduled.  labs/images/medications/previous chart entries reviewed personally and relevant changes/updates noted above.

## 2024-05-04 NOTE — H&P (Signed)
 Subjective:   CC: Myositis of left forearm, unspecified myositis type [M60.9]  HPI:  referred by Mayur Loree Blanch, MD for evaluation of above.   History of Present Illness Evaluated by rhuem for above concerns.  Here today to discuss biopsy.    Past Medical History:  has a past medical history of Anemia, Dysmenorrhea, Dyspareunia in female, Hyperlipidemia (12/23/2019), Hypertension (11/2019), Neutropenia, Ovarian cyst, and Sleep apnea.  Past Surgical History:  has a past surgical history that includes Cholecystectomy; Hysteroscopy W/Endometrial Ablation Using Novasure; Eye surgery; egd (01/13/2020); and Colonoscopy (09/21/2020).  Family History: family history includes Alzheimer's disease in her maternal grandmother; Cancer in her father, paternal grandfather, and paternal uncle; Dementia in her maternal grandmother; Diabetes in her brother, maternal grandmother, and sister; High blood pressure (Hypertension) in her father and mother; Kidney disease in her mother; Schizophrenia in her sister; Thyroid  disease in her mother.  Social History:  reports that she has never smoked. She has never used smokeless tobacco. She reports current alcohol use. She reports that she does not use drugs.  Current Medications: has a current medication list which includes the following prescription(s): adalimumab-ryvk, albuterol  mdi (proventil , ventolin , proair ) hfa, amlodipine , difluprednate, dorzolamide-timolol, estradiol -norethindrone , fluticasone  propionate, folic acid , gabapentin, losartan , and methotrexate.  Allergies:  Allergies  Allergen Reactions   Naltrexone -Bupropion  Other (See Comments)    Palpitations, hypertension    ROS:  A 15 point review of systems was performed and pertinent positives and negatives noted in HPI   Objective:     BP 130/83   Pulse 73   Ht 172.7 cm (5' 8)   Wt 86.6 kg (191 lb)   LMP  (LMP Unknown)   BMI 29.04 kg/m   Constitutional :  No distress, cooperative,  alert  Lymphatics/Throat:  Supple with no lymphadenopathy  Respiratory:  Clear to auscultation bilaterally  Cardiovascular:  Regular rate and rhythm  Gastrointestinal: Soft, non-tender, non-distended, no organomegaly.  Musculoskeletal: Steady gait and movement  Skin: Cool and moist  Psychiatric: Normal affect, non-agitated, not confused         LABS:  N/a   RADS: MRI RADIUS ULNA LEFT WITH AND WITHOUT CONTRAST   Indication: Soft tissue mass, forearm, deep, Left Forearm Pain, Swelling; Myositis, Fasciitis, M79.18 Myalgia, other site   Comparison: None available at the time of dictation.   Technique: Multiplanar, multiecho imaging was performed, including fluid sensitive and pre and postcontrast T1-weighted sequences; IV contrast was administered to potentially improve disease detection.   Findings:   Bone marrow: No fracture or marrow replacing process. Signal abnormality within the proximal to mid radial diaphysis are vascular in etiology.   Joints: Nondedicated imaging of the elbow and wrist demonstrate no sizable effusions or subchondral signal abnormalities small first CMC and coronoid process osteophytes.   Musculature: Subtle nonspecific enhancement of the multiple muscles in the proximal, mid and distal forearm including the pronator teres in the proximal forearm, pronator quadratus in the distal forearm, and the extensor carpi ulnaris, extensor digitorum, and extensor digitorum minimi in the proximal forearm. Subtle edema in the pronator quadratus muscle. No fatty infiltration or atrophy. Subtle fascial edema without enhancement surrounding the volar compartment musculature in the proximal forearm and elbow. No intramuscular fluid collection.   Additional soft tissues: No subcutaneous fluid collection or soft tissue mass. Neurovascular bundles are intact.   Impression: Subtle enhancement of multiple muscles in the proximal, mid and distal forearm with subtle  edema in the pronator quadratus muscle. No associated fatty infiltration or atrophy. Subtle  fascial edema throughout the volar compartment of the proximal forearm and elbow. The involved muscles do not conform to a specific nerve distribution, making denervation changes unlikely.  Findings are nonspecific but favor a myositis, possibly inflammatory. The absence of prominent soft tissue inflammatory changes makes an infectious etiology less likely.   Electronically Signed by:  Rocky Ghazi, MD, [facility] Electronically Signed on:  04/25/2024 9:18 AM  Assessment:      Myositis of left forearm, unspecified myositis type [M60.9] as noted on recent MRI.  Rheum requesting biopsy  Plan:     1. Myositis of left forearm, unspecified myositis type [M60.9] Discussed surgical excision.  Alternatives include continued observation.  Benefits include possible symptom relief, pathologic evaluation, improved cosmesis. Discussed the risk of surgery including recurrence, chronic pain, post-op infxn, poor cosmesis, poor/delayed wound healing, and possible re-operation to address said risks. The risks of general anesthetic, if used, includes MI, CVA, sudden death or even reaction to anesthetic medications also discussed.  Typical post-op recovery time of 3-5 days with possible activity restrictions were also discussed.  The patient verbalized understanding and all questions were answered to the patient's satisfaction.  2. Patient has elected to proceed with surgical treatment. Procedure will be scheduled.  labs/images/medications/previous chart entries reviewed personally and relevant changes/updates noted above.

## 2024-05-04 NOTE — H&P (View-Only) (Signed)
 Subjective:   CC: Myositis of left forearm, unspecified myositis type [M60.9]  HPI:  referred by Mayur Loree Blanch, MD for evaluation of above.   History of Present Illness Evaluated by rhuem for above concerns.  Here today to discuss biopsy.    Past Medical History:  has a past medical history of Anemia, Dysmenorrhea, Dyspareunia in female, Hyperlipidemia (12/23/2019), Hypertension (11/2019), Neutropenia, Ovarian cyst, and Sleep apnea.  Past Surgical History:  has a past surgical history that includes Cholecystectomy; Hysteroscopy W/Endometrial Ablation Using Novasure; Eye surgery; egd (01/13/2020); and Colonoscopy (09/21/2020).  Family History: family history includes Alzheimer's disease in her maternal grandmother; Cancer in her father, paternal grandfather, and paternal uncle; Dementia in her maternal grandmother; Diabetes in her brother, maternal grandmother, and sister; High blood pressure (Hypertension) in her father and mother; Kidney disease in her mother; Schizophrenia in her sister; Thyroid  disease in her mother.  Social History:  reports that she has never smoked. She has never used smokeless tobacco. She reports current alcohol use. She reports that she does not use drugs.  Current Medications: has a current medication list which includes the following prescription(s): adalimumab-ryvk, albuterol  mdi (proventil , ventolin , proair ) hfa, amlodipine , difluprednate, dorzolamide-timolol, estradiol -norethindrone , fluticasone  propionate, folic acid , gabapentin, losartan , and methotrexate.  Allergies:  Allergies  Allergen Reactions   Naltrexone -Bupropion  Other (See Comments)    Palpitations, hypertension    ROS:  A 15 point review of systems was performed and pertinent positives and negatives noted in HPI   Objective:     BP 130/83   Pulse 73   Ht 172.7 cm (5' 8)   Wt 86.6 kg (191 lb)   LMP  (LMP Unknown)   BMI 29.04 kg/m   Constitutional :  No distress, cooperative,  alert  Lymphatics/Throat:  Supple with no lymphadenopathy  Respiratory:  Clear to auscultation bilaterally  Cardiovascular:  Regular rate and rhythm  Gastrointestinal: Soft, non-tender, non-distended, no organomegaly.  Musculoskeletal: Steady gait and movement  Skin: Cool and moist  Psychiatric: Normal affect, non-agitated, not confused         LABS:  N/a   RADS: MRI RADIUS ULNA LEFT WITH AND WITHOUT CONTRAST   Indication: Soft tissue mass, forearm, deep, Left Forearm Pain, Swelling; Myositis, Fasciitis, M79.18 Myalgia, other site   Comparison: None available at the time of dictation.   Technique: Multiplanar, multiecho imaging was performed, including fluid sensitive and pre and postcontrast T1-weighted sequences; IV contrast was administered to potentially improve disease detection.   Findings:   Bone marrow: No fracture or marrow replacing process. Signal abnormality within the proximal to mid radial diaphysis are vascular in etiology.   Joints: Nondedicated imaging of the elbow and wrist demonstrate no sizable effusions or subchondral signal abnormalities small first CMC and coronoid process osteophytes.   Musculature: Subtle nonspecific enhancement of the multiple muscles in the proximal, mid and distal forearm including the pronator teres in the proximal forearm, pronator quadratus in the distal forearm, and the extensor carpi ulnaris, extensor digitorum, and extensor digitorum minimi in the proximal forearm. Subtle edema in the pronator quadratus muscle. No fatty infiltration or atrophy. Subtle fascial edema without enhancement surrounding the volar compartment musculature in the proximal forearm and elbow. No intramuscular fluid collection.   Additional soft tissues: No subcutaneous fluid collection or soft tissue mass. Neurovascular bundles are intact.   Impression: Subtle enhancement of multiple muscles in the proximal, mid and distal forearm with subtle  edema in the pronator quadratus muscle. No associated fatty infiltration or atrophy. Subtle  fascial edema throughout the volar compartment of the proximal forearm and elbow. The involved muscles do not conform to a specific nerve distribution, making denervation changes unlikely.  Findings are nonspecific but favor a myositis, possibly inflammatory. The absence of prominent soft tissue inflammatory changes makes an infectious etiology less likely.   Electronically Signed by:  Rocky Ghazi, MD, [facility] Electronically Signed on:  04/25/2024 9:18 AM  Assessment:      Myositis of left forearm, unspecified myositis type [M60.9] as noted on recent MRI.  Rheum requesting biopsy  Plan:     1. Myositis of left forearm, unspecified myositis type [M60.9] Discussed surgical excision.  Alternatives include continued observation.  Benefits include possible symptom relief, pathologic evaluation, improved cosmesis. Discussed the risk of surgery including recurrence, chronic pain, post-op infxn, poor cosmesis, poor/delayed wound healing, and possible re-operation to address said risks. The risks of general anesthetic, if used, includes MI, CVA, sudden death or even reaction to anesthetic medications also discussed.  Typical post-op recovery time of 3-5 days with possible activity restrictions were also discussed.  The patient verbalized understanding and all questions were answered to the patient's satisfaction.  2. Patient has elected to proceed with surgical treatment. Procedure will be scheduled.  labs/images/medications/previous chart entries reviewed personally and relevant changes/updates noted above.

## 2024-05-05 ENCOUNTER — Ambulatory Visit
Admission: RE | Admit: 2024-05-05 | Discharge: 2024-05-05 | Disposition: A | Discharge status: Home / Self Care | Attending: Surgery | Admitting: Surgery

## 2024-05-05 ENCOUNTER — Encounter: Payer: Self-pay | Admitting: Surgery

## 2024-05-05 ENCOUNTER — Other Ambulatory Visit: Payer: Self-pay

## 2024-05-05 ENCOUNTER — Encounter
Admission: RE | Disposition: A | Discharge status: Home / Self Care | Payer: Self-pay | Source: Home / Self Care | Attending: Surgery

## 2024-05-05 ENCOUNTER — Ambulatory Visit: Admitting: Registered Nurse

## 2024-05-05 DIAGNOSIS — M609 Myositis, unspecified: Secondary | ICD-10-CM | POA: Insufficient documentation

## 2024-05-05 DIAGNOSIS — G4730 Sleep apnea, unspecified: Secondary | ICD-10-CM | POA: Diagnosis not present

## 2024-05-05 DIAGNOSIS — I10 Essential (primary) hypertension: Secondary | ICD-10-CM | POA: Diagnosis not present

## 2024-05-05 HISTORY — PX: MUSCLE BIOPSY: SHX716

## 2024-05-05 LAB — POCT PREGNANCY, URINE: Preg Test, Ur: NEGATIVE

## 2024-05-05 SURGERY — MUSCLE BIOPSY
Anesthesia: General | Site: Arm Lower | Laterality: Left

## 2024-05-05 MED ORDER — ACETAMINOPHEN 500 MG PO TABS
ORAL_TABLET | ORAL | Status: AC
  Filled 2024-05-05: qty 2

## 2024-05-05 MED ORDER — KETAMINE HCL 10 MG/ML IJ SOLN
INTRAMUSCULAR | Status: DC | PRN
  Administered 2024-05-05: 20 mg via INTRAVENOUS
  Administered 2024-05-05: 30 mg via INTRAVENOUS

## 2024-05-05 MED ORDER — PROPOFOL 10 MG/ML IV BOLUS
INTRAVENOUS | Status: AC
  Filled 2024-05-05: qty 20

## 2024-05-05 MED ORDER — CELECOXIB 200 MG PO CAPS
200.0000 mg | ORAL_CAPSULE | Freq: Once | ORAL | Status: AC
  Administered 2024-05-05: 200 mg via ORAL

## 2024-05-05 MED ORDER — MIDAZOLAM HCL 2 MG/2ML IJ SOLN
INTRAMUSCULAR | Status: AC
  Filled 2024-05-05: qty 2

## 2024-05-05 MED ORDER — ACETAMINOPHEN 500 MG PO TABS
1000.0000 mg | ORAL_TABLET | Freq: Once | ORAL | Status: AC
  Administered 2024-05-05: 1000 mg via ORAL

## 2024-05-05 MED ORDER — ACETAMINOPHEN 10 MG/ML IV SOLN: 1000.0000 mg | Freq: Once | INTRAVENOUS | Status: DC | PRN

## 2024-05-05 MED ORDER — DEXMEDETOMIDINE HCL IN NACL 80 MCG/20ML IV SOLN
INTRAVENOUS | Status: AC
  Filled 2024-05-05: qty 20

## 2024-05-05 MED ORDER — BUPIVACAINE-EPINEPHRINE (PF) 0.5% -1:200000 IJ SOLN
INTRAMUSCULAR | Status: AC
  Filled 2024-05-05: qty 30

## 2024-05-05 MED ORDER — OXYCODONE HCL 5 MG PO TABS
5.0000 mg | ORAL_TABLET | Freq: Once | ORAL | Status: AC | PRN
  Administered 2024-05-05: 5 mg via ORAL

## 2024-05-05 MED ORDER — DROPERIDOL 2.5 MG/ML IJ SOLN: 0.6250 mg | Freq: Once | INTRAMUSCULAR | Status: DC | PRN

## 2024-05-05 MED ORDER — CEFAZOLIN SODIUM-DEXTROSE 2-4 GM/100ML-% IV SOLN
INTRAVENOUS | Status: AC
  Filled 2024-05-05: qty 100

## 2024-05-05 MED ORDER — OXYCODONE-ACETAMINOPHEN 5-325 MG PO TABS
1.0000 | ORAL_TABLET | Freq: Three times a day (TID) | ORAL | 0 refills | Status: AC | PRN
  Filled 2024-05-05: qty 6, 2d supply, fill #0

## 2024-05-05 MED ORDER — ACETAMINOPHEN 325 MG PO TABS
650.0000 mg | ORAL_TABLET | Freq: Three times a day (TID) | ORAL | 0 refills | Status: AC | PRN
  Filled 2024-05-05: qty 40, 7d supply, fill #0

## 2024-05-05 MED ORDER — LIDOCAINE HCL (CARDIAC) PF 100 MG/5ML IV SOSY
PREFILLED_SYRINGE | INTRAVENOUS | Status: DC | PRN
  Administered 2024-05-05: 100 mg via INTRATRACHEAL

## 2024-05-05 MED ORDER — OXYCODONE HCL 5 MG PO TABS
ORAL_TABLET | ORAL | Status: AC
  Filled 2024-05-05: qty 1

## 2024-05-05 MED ORDER — CELECOXIB 200 MG PO CAPS
ORAL_CAPSULE | ORAL | Status: AC
  Filled 2024-05-05: qty 1

## 2024-05-05 MED ORDER — OXYCODONE HCL 5 MG/5ML PO SOLN: 5.0000 mg | Freq: Once | ORAL | Status: AC | PRN

## 2024-05-05 MED ORDER — MIDAZOLAM HCL (PF) 2 MG/2ML IJ SOLN
INTRAMUSCULAR | Status: DC | PRN
  Administered 2024-05-05: 2 mg via INTRAVENOUS

## 2024-05-05 MED ORDER — DEXMEDETOMIDINE HCL IN NACL 200 MCG/50ML IV SOLN
INTRAVENOUS | Status: DC | PRN
  Administered 2024-05-05: 12 ug via INTRAVENOUS

## 2024-05-05 MED ORDER — DOCUSATE SODIUM 100 MG PO CAPS
100.0000 mg | ORAL_CAPSULE | Freq: Two times a day (BID) | ORAL | 0 refills | Status: AC | PRN
  Filled 2024-05-05: qty 20, 10d supply, fill #0

## 2024-05-05 MED ORDER — FENTANYL CITRATE (PF) 100 MCG/2ML IJ SOLN: 25.0000 ug | INTRAMUSCULAR | Status: DC | PRN

## 2024-05-05 MED ORDER — FENTANYL CITRATE (PF) 100 MCG/2ML IJ SOLN
INTRAMUSCULAR | Status: DC | PRN
  Administered 2024-05-05 (×2): 25 ug via INTRAVENOUS
  Administered 2024-05-05: 50 ug via INTRAVENOUS

## 2024-05-05 MED ORDER — KETAMINE HCL 50 MG/5ML IJ SOSY
PREFILLED_SYRINGE | INTRAMUSCULAR | Status: AC
  Filled 2024-05-05: qty 5

## 2024-05-05 MED ORDER — CHLORHEXIDINE GLUCONATE 0.12 % MT SOLN
OROMUCOSAL | Status: AC
  Filled 2024-05-05: qty 15

## 2024-05-05 MED ORDER — FENTANYL CITRATE (PF) 100 MCG/2ML IJ SOLN
INTRAMUSCULAR | Status: AC
  Filled 2024-05-05: qty 2

## 2024-05-05 SURGICAL SUPPLY — 19 items
BLADE SURG 15 STRL LF DISP TIS (BLADE) ×1 IMPLANT
CLAMP MUSCLE BIOPSY 12MM DISP (MISCELLANEOUS) ×1 IMPLANT
DERMABOND ADVANCED .7 DNX12 (GAUZE/BANDAGES/DRESSINGS) ×1 IMPLANT
DRAPE LAPAROTOMY 77X122 PED (DRAPES) ×1 IMPLANT
ELECTRODE REM PT RTRN 9FT ADLT (ELECTROSURGICAL) ×1 IMPLANT
GLOVE BIOGEL PI IND STRL 7.0 (GLOVE) ×1 IMPLANT
GLOVE SURG SYN 6.5 PF PI (GLOVE) ×3 IMPLANT
GOWN STRL REUS W/ TWL LRG LVL3 (GOWN DISPOSABLE) ×3 IMPLANT
KIT TURNOVER KIT A (KITS) ×1 IMPLANT
LABEL OR SOLS (LABEL) ×1 IMPLANT
MANIFOLD NEPTUNE II (INSTRUMENTS) ×1 IMPLANT
NEEDLE HYPO 22X1.5 SAFETY MO (MISCELLANEOUS) ×1 IMPLANT
NS IRRIG 500ML POUR BTL (IV SOLUTION) ×1 IMPLANT
PACK BASIN MINOR ARMC (MISCELLANEOUS) ×1 IMPLANT
SOLN STERILE WATER 500 ML (IV SOLUTION) ×1 IMPLANT
SUT VIC AB 3-0 SH 27X BRD (SUTURE) IMPLANT
SUTURE MNCRL 4-0 27XMF (SUTURE) ×1 IMPLANT
SYR 10ML LL (SYRINGE) ×1 IMPLANT
TRAP FLUID SMOKE EVACUATOR (MISCELLANEOUS) ×1 IMPLANT

## 2024-05-05 NOTE — Transfer of Care (Signed)
 Immediate Anesthesia Transfer of Care Note  Patient: Andrea Pope  Procedure(s) Performed: MUSCLE BIOPSY (Left: Arm Lower)  Patient Location: PACU  Anesthesia Type:General  Level of Consciousness: awake  Airway & Oxygen Therapy: Patient Spontanous Breathing  Post-op Assessment: Report given to RN  Post vital signs: Reviewed and stable  Last Vitals:  Vitals Value Taken Time  BP 142/77 05/05/24 13:20  Temp    Pulse 83 05/05/24 13:24  Resp 15 05/05/24 13:24  SpO2 100 % 05/05/24 13:24  Vitals shown include unfiled device data.  Last Pain:  Vitals:   05/05/24 1116  TempSrc: Temporal  PainSc: 2          Complications: No notable events documented.

## 2024-05-05 NOTE — Discharge Instructions (Signed)
 Removal, Care After This sheet gives you information about how to care for yourself after your procedure. Your health care provider may also give you more specific instructions. If you have problems or questions, contact your health care provider. What can I expect after the procedure? After the procedure, it is common to have: Soreness. Bruising. Itching. Follow these instructions at home: site care Follow instructions from your health care provider about how to take care of your site. Make sure you: Wash your hands with soap and water before and after you change your bandage (dressing). If soap and water are not available, use hand sanitizer. Leave stitches (sutures), skin glue, or adhesive strips in place. These skin closures may need to stay in place for 2 weeks or longer. If adhesive strip edges start to loosen and curl up, you may trim the loose edges. Do not remove adhesive strips completely unless your health care provider tells you to do that. If the area bleeds or bruises, apply gentle pressure for 10 minutes. OK TO SHOWER IN 24HRS  Check your site every day for signs of infection. Check for: Redness, swelling, or pain. Fluid or blood. Warmth. Pus or a bad smell.  General instructions Rest and then return to your normal activities as told by your health care provider.  tylenol and advil as needed for discomfort.  Please alternate between the two every four hours as needed for pain.    Use narcotics, if prescribed, only when tylenol and motrin is not enough to control pain.  325-650mg  every 8hrs to max of 3000mg /24hrs (including the 325mg  in every norco dose) for the tylenol.    Advil up to 800mg  per dose every 8hrs as needed for pain.   Keep all follow-up visits as told by your health care provider. This is important. Contact a health care provider if: You have redness, swelling, or pain around your site. You have fluid or blood coming from your site. Your site feels warm to  the touch. You have pus or a bad smell coming from your site. You have a fever. Your sutures, skin glue, or adhesive strips loosen or come off sooner than expected. Get help right away if: You have bleeding that does not stop with pressure or a dressing. Summary After the procedure, it is common to have some soreness, bruising, and itching at the site. Follow instructions from your health care provider about how to take care of your site. Check your site every day for signs of infection. Contact a health care provider if you have redness, swelling, or pain around your site, or your site feels warm to the touch. Keep all follow-up visits as told by your health care provider. This is important. This information is not intended to replace advice given to you by your health care provider. Make sure you discuss any questions you have with your health care provider. Document Released: 05/19/2015 Document Revised: 10/20/2017 Document Reviewed: 10/20/2017 Elsevier Interactive Patient Education  [employer].

## 2024-05-05 NOTE — Anesthesia Preprocedure Evaluation (Addendum)
 "                                  Anesthesia Evaluation  Patient identified by MRN, date of birth, ID band Patient awake    Reviewed: Allergy & Precautions, H&P , NPO status , Patient's Chart, lab work & pertinent test results  Airway Mallampati: II  TM Distance: >3 FB Neck ROM: full    Dental no notable dental hx.    Pulmonary sleep apnea    Pulmonary exam normal        Cardiovascular hypertension, Normal cardiovascular exam     Neuro/Psych  PSYCHIATRIC DISORDERS  Depression    negative neurological ROS     GI/Hepatic negative GI ROS, Neg liver ROS,,,  Endo/Other  negative endocrine ROS    Renal/GU negative Renal ROS  negative genitourinary   Musculoskeletal   Abdominal Normal abdominal exam  (+)   Peds  Hematology   Anesthesia Other Findings joint pains, stiffness, uveitis Myalgia Paresthesia and pain of left extremity   Past Medical History: 01/10/2015: Abnormal thyroid  stimulating hormone (TSH) level No date: Abnormal thyroid  stimulating hormone level No date: Allergy No date: Anemia     Comment:  iron deficiency No date: Encounter for initial prescription of injectable  contraceptive No date: Female stress incontinence 12/05/2015: Gallstones No date: High cholesterol 11/29/2015: History of ectopic pregnancy No date: Hypertension No date: Menorrhagia with irregular cycle 07/23/2016: Neutropenia 11/29/2015: Ovarian cyst 11/29/2015: Ovarian cyst 05/13/2017: Patellofemoral stress syndrome 08/14/2015: Proteinuria No date: Spider veins of both lower extremities 03/24/2017: Status post D&C/hysteroscopy/endometrial ablation     Comment:  Good TVH candidate Gynecoid pelvis Uterus sounded to 9.5              cm Pathology: Proliferative endometrium with breakdown               changes; negative for atypia/hyperplasia/carcinoma  Past Surgical History: 03/26/2022: CATARACT EXTRACTION W/ INTRAOCULAR LENS IMPLANT; Left 01/19/2016: CHOLECYSTECTOMY;  N/A     Comment:  Procedure: LAPAROSCOPIC CHOLECYSTECTOMY WITH               INTRAOPERATIVE CHOLANGIOGRAM;  Surgeon: Louanne KANDICE Muse, MD;  Location: [facility];  Service: General;                Laterality: N/A; 03/24/2017: DILITATION & CURRETTAGE/HYSTROSCOPY WITH NOVASURE  ABLATION; N/A     Comment:  Procedure: DILATATION & CURETTAGE/HYSTEROSCOPY WITH               NOVASURE ABLATION;  Surgeon: Kathe Gladis LABOR, MD;                Location: [facility] OR[facility];  Service: Gynecology;  Laterality:               N/A; 10/2016: laser surgery on eye; Right     Reproductive/Obstetrics negative OB ROS                              Anesthesia Physical Anesthesia Plan  ASA: 2  Anesthesia Plan: General   Post-op Pain Management: Tylenol  PO (pre-op)* and Celebrex PO (pre-op)*   Induction: Intravenous  PONV Risk Score and Plan: Propofol  infusion and TIVA  Airway Management Planned: Natural Airway  Additional Equipment:   Intra-op Plan:   Post-operative Plan:   Informed  Consent: I have reviewed the patients History and Physical, chart, labs and discussed the procedure including the risks, benefits and alternatives for the proposed anesthesia with the patient or authorized representative who has indicated his/her understanding and acceptance.     Dental Advisory Given  Plan Discussed with: CRNA and Surgeon  Anesthesia Plan Comments:          Anesthesia Quick Evaluation  "

## 2024-05-05 NOTE — Op Note (Signed)
 Pre-Op Dx: Myositis Post-Op Dx: Same Anesthesia: MAC EBL: 20 mL Complications:  none apparent Specimen: Left forearm muscle biopsy x 2, 1 free tissue, 1 excised well clamped Procedure: excisional biopsy left forearm muscle Surgeon: Tye  Indications for procedure: See H&P  Description of Procedure:  Consent obtained, time out performed.  Patient placed in supine position.  Area sterilized and draped in usual position.  Local infused to area previously marked.  5 cm incision made through dermis with 15blade dissection carried down to muscle fascia.  Fascia was incised to expose the actual muscle fibers.  Biopsy clamps then threaded through the threads and clamped, electrocautery then used to excise the clamp specimen out of the operative field.  This was passed off the field per protocol.  Muscle fibers adjacent to the previous biopsy site then suture-ligated, and the 1 cm x 1 cm x 1 cm piece of tissue wrapped in white gauze per protocol and passed off field pending pathology.    Additional area of bleeding suture-ligated with 3-0 silk.  Wound irrigated, wound hemostasis noted.  Muscle fascia then approximated using running 3-0 Vicryl.  Incision then closed in two layer fashion with 3-0 vicryl in interrupted fashion for deep dermal layer, then running 4-0 monocryl in subcuticular fashion for epidermal layer.   Wound then dressed with dermabond.  Pt tolerated procedure well, and transferred to PACU in stable condition. Sponge and instrument count correct at end of procedure.

## 2024-05-05 NOTE — Interval H&P Note (Signed)
 No change. OK to proceed.

## 2024-05-07 ENCOUNTER — Encounter: Payer: Self-pay | Admitting: Surgery

## 2024-05-07 NOTE — Anesthesia Postprocedure Evaluation (Signed)
"   Anesthesia Post Note  Patient: Andrea Pope  Procedure(s) Performed: MUSCLE BIOPSY (Left: Arm Lower)  Patient location during evaluation: PACU Anesthesia Type: General Level of consciousness: awake and alert Pain management: pain level controlled Vital Signs Assessment: post-procedure vital signs reviewed and stable Respiratory status: spontaneous breathing, nonlabored ventilation and respiratory function stable Cardiovascular status: blood pressure returned to baseline and stable Postop Assessment: no apparent nausea or vomiting Anesthetic complications: no   No notable events documented.   Last Vitals:  Vitals:   05/05/24 1445 05/05/24 1500  BP: (!) 154/91 132/76  Pulse: (!) 52 (!) 54  Resp: 11 16  Temp:  36.5 C  SpO2: 100% 100%    Last Pain:  Vitals:   05/05/24 1500  TempSrc: Temporal  PainSc: 0-No pain                 Camellia Merilee Louder      "

## 2024-06-11 ENCOUNTER — Ambulatory Visit: Admitting: Internal Medicine

## 2024-07-13 ENCOUNTER — Ambulatory Visit: Admitting: Family Medicine
